# Patient Record
Sex: Male | Born: 1960 | Race: Black or African American | Hispanic: No | Smoking: Never smoker
Health system: Southern US, Community
[De-identification: ages and names within clinical notes are randomized; demographics above are authoritative.]

## PROBLEM LIST (undated history)

## (undated) DIAGNOSIS — E119 Type 2 diabetes mellitus without complications: Secondary | ICD-10-CM

## (undated) DIAGNOSIS — I890 Lymphedema, not elsewhere classified: Secondary | ICD-10-CM

## (undated) DIAGNOSIS — G459 Transient cerebral ischemic attack, unspecified: Secondary | ICD-10-CM

## (undated) DIAGNOSIS — E11319 Type 2 diabetes mellitus with unspecified diabetic retinopathy without macular edema: Secondary | ICD-10-CM

## (undated) DIAGNOSIS — I10 Essential (primary) hypertension: Secondary | ICD-10-CM

## (undated) DIAGNOSIS — G4733 Obstructive sleep apnea (adult) (pediatric): Secondary | ICD-10-CM

## (undated) DIAGNOSIS — N400 Enlarged prostate without lower urinary tract symptoms: Secondary | ICD-10-CM

## (undated) DIAGNOSIS — I509 Heart failure, unspecified: Secondary | ICD-10-CM

## (undated) HISTORY — PX: BLADDER SURGERY: SHX569

## (undated) HISTORY — PX: CARDIAC CATHETERIZATION: SHX172

---

## 2013-09-13 ENCOUNTER — Telehealth: Payer: Self-pay | Admitting: Cardiovascular Disease

## 2013-09-15 NOTE — Telephone Encounter (Signed)
Closed encounter °

## 2013-09-26 ENCOUNTER — Ambulatory Visit: Payer: Self-pay | Admitting: Cardiovascular Disease

## 2013-11-01 ENCOUNTER — Encounter: Payer: Self-pay | Admitting: Cardiovascular Disease

## 2015-05-12 ENCOUNTER — Encounter (HOSPITAL_COMMUNITY): Payer: Self-pay | Admitting: Emergency Medicine

## 2015-05-12 ENCOUNTER — Emergency Department (HOSPITAL_COMMUNITY)
Admission: EM | Admit: 2015-05-12 | Discharge: 2015-05-12 | Disposition: A | Discharge status: Home / Self Care | Payer: Medicare Other | Attending: Emergency Medicine | Admitting: Emergency Medicine

## 2015-05-12 DIAGNOSIS — E11319 Type 2 diabetes mellitus with unspecified diabetic retinopathy without macular edema: Secondary | ICD-10-CM | POA: Diagnosis not present

## 2015-05-12 DIAGNOSIS — I509 Heart failure, unspecified: Secondary | ICD-10-CM | POA: Insufficient documentation

## 2015-05-12 DIAGNOSIS — Z794 Long term (current) use of insulin: Secondary | ICD-10-CM | POA: Insufficient documentation

## 2015-05-12 DIAGNOSIS — I10 Essential (primary) hypertension: Secondary | ICD-10-CM | POA: Insufficient documentation

## 2015-05-12 DIAGNOSIS — Z8669 Personal history of other diseases of the nervous system and sense organs: Secondary | ICD-10-CM | POA: Diagnosis not present

## 2015-05-12 DIAGNOSIS — Z79899 Other long term (current) drug therapy: Secondary | ICD-10-CM | POA: Diagnosis not present

## 2015-05-12 DIAGNOSIS — Z760 Encounter for issue of repeat prescription: Secondary | ICD-10-CM | POA: Diagnosis not present

## 2015-05-12 DIAGNOSIS — N400 Enlarged prostate without lower urinary tract symptoms: Secondary | ICD-10-CM | POA: Diagnosis not present

## 2015-05-12 DIAGNOSIS — Z8673 Personal history of transient ischemic attack (TIA), and cerebral infarction without residual deficits: Secondary | ICD-10-CM | POA: Diagnosis not present

## 2015-05-12 DIAGNOSIS — Z7982 Long term (current) use of aspirin: Secondary | ICD-10-CM | POA: Diagnosis not present

## 2015-05-12 HISTORY — DX: Type 2 diabetes mellitus without complications: E11.9

## 2015-05-12 HISTORY — DX: Transient cerebral ischemic attack, unspecified: G45.9

## 2015-05-12 HISTORY — DX: Heart failure, unspecified: I50.9

## 2015-05-12 HISTORY — DX: Type 2 diabetes mellitus with unspecified diabetic retinopathy without macular edema: E11.319

## 2015-05-12 HISTORY — DX: Obstructive sleep apnea (adult) (pediatric): G47.33

## 2015-05-12 HISTORY — DX: Lymphedema, not elsewhere classified: I89.0

## 2015-05-12 HISTORY — DX: Benign prostatic hyperplasia without lower urinary tract symptoms: N40.0

## 2015-05-12 HISTORY — DX: Essential (primary) hypertension: I10

## 2015-05-12 LAB — CBG MONITORING, ED: Glucose-Capillary: 194 mg/dL — ABNORMAL HIGH (ref 65–99)

## 2015-05-12 MED ORDER — BUMETANIDE 2 MG PO TABS
2.0000 mg | ORAL_TABLET | Freq: Two times a day (BID) | ORAL | Status: DC
  Administered 2015-05-12: 2 mg via ORAL
  Filled 2015-05-12 (×2): qty 1

## 2015-05-12 MED ORDER — HYDRALAZINE HCL 50 MG PO TABS
100.0000 mg | ORAL_TABLET | Freq: Three times a day (TID) | ORAL | Status: DC
  Administered 2015-05-12: 100 mg via ORAL
  Filled 2015-05-12: qty 2

## 2015-05-12 MED ORDER — INSULIN GLARGINE 100 UNIT/ML ~~LOC~~ SOLN
8.0000 [IU] | Freq: Every day | SUBCUTANEOUS | Status: DC
  Administered 2015-05-12: 8 [IU] via SUBCUTANEOUS
  Filled 2015-05-12: qty 0.08

## 2015-05-12 MED ORDER — SULFASALAZINE 500 MG PO TABS
500.0000 mg | ORAL_TABLET | Freq: Two times a day (BID) | ORAL | Status: DC
  Administered 2015-05-12: 500 mg via ORAL
  Filled 2015-05-12: qty 1

## 2015-05-12 MED ORDER — CARVEDILOL 25 MG PO TABS
25.0000 mg | ORAL_TABLET | Freq: Two times a day (BID) | ORAL | Status: DC
Start: 2015-05-13 — End: 2015-05-12
  Administered 2015-05-12: 25 mg via ORAL
  Filled 2015-05-12: qty 1

## 2015-05-12 MED ORDER — CALCIUM CARBONATE-VITAMIN D 500-200 MG-UNIT PO TABS
1.0000 | ORAL_TABLET | Freq: Two times a day (BID) | ORAL | Status: DC
  Administered 2015-05-12: 1 via ORAL
  Filled 2015-05-12: qty 1

## 2015-05-12 MED ORDER — DOXAZOSIN MESYLATE 4 MG PO TABS
4.0000 mg | ORAL_TABLET | Freq: Every day | ORAL | Status: DC
  Administered 2015-05-12: 4 mg via ORAL
  Filled 2015-05-12: qty 1

## 2015-05-12 MED ORDER — METOLAZONE 5 MG PO TABS
5.0000 mg | ORAL_TABLET | ORAL | Status: DC
  Administered 2015-05-12: 5 mg via ORAL
  Filled 2015-05-12: qty 1

## 2015-05-12 NOTE — Discharge Instructions (Signed)
You have prescriptions to fill Please ask the staff to help you fill these through the [place] You have been given tonight's medication including the Lantus Insulin  Medicine Refill at the Emergency Department We have refilled your medicine today, but it is best for you to get refills through your primary health care provider's office. In the future, please plan ahead so you do not need to get refills from the emergency department. If the medicine we refilled was a maintenance medicine, you may have received only enough to get you by until you are able to see your regular health care provider.   This information is not intended to replace advice given to you by your health care provider. Make sure you discuss any questions you have with your health care provider.   Document Released: 05/09/2003 Document Revised: 02/10/2014 Document Reviewed: 04/29/2013 Elsevier Interactive Patient Education [employer]

## 2015-05-12 NOTE — ED Provider Notes (Signed)
CSN: [id number]     Arrival date & time 05/12/15  1740 History   First MD Initiated Contact with Patient 05/12/15 1928     Chief Complaint  Patient presents with  . Hypertension  . Medication Refill     (Consider location/radiation/quality/duration/timing/severity/associated sxs/prior Treatment) HPI Comments: 55 year old patient who was discharged from a rehabilitation facility to the surgical center, which is a residence for homeless veterans.  He was discharged with prescriptions, but states that the pharmacy at the facility is not open until Monday and he is concerned because he hasn't had any of his routine medications.  Since being discharged from the hospital this morning. He denies any shortness of breath, nausea, headache, chest pain, dizziness.  Patient is a 55 y.o. male presenting with hypertension. The history is provided by the patient.  Hypertension This is a chronic problem. Pertinent negatives include no chest pain, coughing or fever.    Past Medical History  Diagnosis Date  . BPH (benign prostatic hyperplasia)   . CHF (congestive heart failure) (HCC)   . Hypertension   . Diabetes mellitus without complication (HCC)   . Diabetic retinopathy associated with type 2 diabetes mellitus (HCC)   . OSA (obstructive sleep apnea)   . Lymphedema   . TIA (transient ischemic attack)    History reviewed. No pertinent past surgical history. History reviewed. No pertinent family history. Social History  Substance Use Topics  . Smoking status: Never Smoker   . Smokeless tobacco: None  . Alcohol Use: No    Review of Systems  Constitutional: Negative for fever.  Respiratory: Negative for cough and shortness of breath.   Cardiovascular: Negative for chest pain.  All other systems reviewed and are negative.     Allergies  Chicken allergy  Home Medications   Prior to Admission medications   Medication Sig Start Date End Date Taking? Authorizing Provider  acetaminophen  (TYLENOL) 325 MG tablet Take 650 mg by mouth every 6 (six) hours as needed for mild pain, moderate pain or headache.   Yes Historical Provider, MD  amLODipine (NORVASC) 10 MG tablet Take 10 mg by mouth daily.   Yes Historical Provider, MD  aspirin EC 81 MG tablet Take 81 mg by mouth daily.   Yes Historical Provider, MD  bumetanide (BUMEX) 2 MG tablet Take 2 mg by mouth 2 (two) times daily.   Yes Historical Provider, MD  Calcium Carbonate-Vitamin D (CALCIUM-VITAMIN D) 500-200 MG-UNIT tablet Take 1 tablet by mouth 2 (two) times daily.   Yes Historical Provider, MD  carvedilol (COREG) 25 MG tablet Take 25 mg by mouth 2 (two) times daily with a meal.   Yes Historical Provider, MD  doxazosin (CARDURA) 4 MG tablet Take 4 mg by mouth daily.   Yes Historical Provider, MD  hydrALAZINE (APRESOLINE) 100 MG tablet Take 100 mg by mouth 3 (three) times daily.   Yes Historical Provider, MD  Insulin Aspart (NOVOLOG FLEXPEN [place]) Inject 5 Units into the skin daily.   Yes Historical Provider, MD  insulin aspart (NOVOLOG FLEXPEN) 100 UNIT/ML FlexPen Inject 2-10 Units into the skin 3 (three) times daily with meals. Per sliding scale : 0-200= 0 units 201-250= 2 units  251-300= 4 units 301-350= 6 units 351-400= 8 units 401-450= 10 units 451-999= CALL MD   Yes Historical Provider, MD  insulin glargine (LANTUS) 100 UNIT/ML injection Inject 8 Units into the skin at bedtime.   Yes Historical Provider, MD  lisinopril (PRINIVIL,ZESTRIL) 40 MG tablet Take 40 mg by  mouth daily.   Yes Historical Provider, MD  metolazone (ZAROXOLYN) 5 MG tablet Take 5 mg by mouth every 3 (three) days.   Yes Historical Provider, MD  Multiple Vitamins-Minerals (MULTIVITAMIN ADULT PO) Take 1 tablet by mouth daily.   Yes Historical Provider, MD  pantoprazole (PROTONIX) 40 MG tablet Take 40 mg by mouth daily.   Yes Historical Provider, MD  polyethylene glycol (MIRALAX / GLYCOLAX) packet Take 17 g by mouth daily as needed for mild constipation or  moderate constipation.   Yes Historical Provider, MD  silver sulfADIAZINE (SILVADENE) 1 % cream Apply 1 application topically daily as needed (skin).   Yes Historical Provider, MD  spironolactone (ALDACTONE) 25 MG tablet Take 25 mg by mouth daily.   Yes Historical Provider, MD  sulfaSALAzine (AZULFIDINE) 500 MG tablet Take 500 mg by mouth 2 (two) times daily.   Yes Historical Provider, MD  tamsulosin (FLOMAX) 0.4 MG CAPS capsule Take 0.4 mg by mouth daily.   Yes Historical Provider, MD   BP 166/85 mmHg  Pulse 77  Temp(Src) 98 F (36.7 C) (Oral)  Resp 16  SpO2 98% Physical Exam  Constitutional: He is oriented to person, place, and time. He appears well-developed and well-nourished.  HENT:  Head: Normocephalic.  Eyes: Pupils are equal, round, and reactive to light.  Neck: Normal range of motion.  Cardiovascular: Normal rate.   Pulmonary/Chest: Effort normal.  Musculoskeletal: Normal range of motion.  Neurological: He is alert and oriented to person, place, and time.  Skin: Skin is warm.  Vitals reviewed.   ED Course  Procedures (including critical care time) Labs Review Labs Reviewed  CBG MONITORING, ED - Abnormal; Notable for the following:    Glucose-Capillary 194 (*)    All other components within normal limits    Imaging Review No results found. I have personally reviewed and evaluated these images and lab results as part of my medical decision-making.   EKG Interpretation None    I have ordered the patient's  PM medications.  I explained that I cannot provide him with another days worth of medication.  He will have to discuss with his residential advisor of perhaps somebody going to the [facility] itself to fill his prescriptions for tomorrow.  MDM   Final diagnoses:  Encounter for medication refill        Earley Favor, NP 05/12/15 2008  Lorre Nick, MD 05/16/15 [phone]

## 2015-05-12 NOTE — ED Notes (Signed)
Bed: GBM2 Expected date:  Expected time:  Means of arrival:  Comments: EMS- hypertension- pt wants rx

## 2015-05-12 NOTE — ED Notes (Signed)
Pt discharged from a rehab facility after CHF flare-up. Pt was discharged without prescription for home medications. Pt not having any symptoms, but wants prescriptions for his home medications. Pt was on many medications at the facility.

## 2015-05-16 ENCOUNTER — Emergency Department (HOSPITAL_COMMUNITY)
Admission: EM | Admit: 2015-05-16 | Discharge: 2015-05-16 | Disposition: A | Discharge status: Home / Self Care | Payer: Medicare Other | Attending: Emergency Medicine | Admitting: Emergency Medicine

## 2015-05-16 ENCOUNTER — Encounter (HOSPITAL_COMMUNITY): Payer: Self-pay

## 2015-05-16 DIAGNOSIS — Z8669 Personal history of other diseases of the nervous system and sense organs: Secondary | ICD-10-CM | POA: Insufficient documentation

## 2015-05-16 DIAGNOSIS — Z9889 Other specified postprocedural states: Secondary | ICD-10-CM | POA: Diagnosis not present

## 2015-05-16 DIAGNOSIS — I509 Heart failure, unspecified: Secondary | ICD-10-CM | POA: Insufficient documentation

## 2015-05-16 DIAGNOSIS — Z79899 Other long term (current) drug therapy: Secondary | ICD-10-CM | POA: Insufficient documentation

## 2015-05-16 DIAGNOSIS — I10 Essential (primary) hypertension: Secondary | ICD-10-CM | POA: Insufficient documentation

## 2015-05-16 DIAGNOSIS — E860 Dehydration: Secondary | ICD-10-CM | POA: Diagnosis not present

## 2015-05-16 DIAGNOSIS — E11319 Type 2 diabetes mellitus with unspecified diabetic retinopathy without macular edema: Secondary | ICD-10-CM | POA: Diagnosis not present

## 2015-05-16 DIAGNOSIS — R112 Nausea with vomiting, unspecified: Secondary | ICD-10-CM | POA: Insufficient documentation

## 2015-05-16 DIAGNOSIS — Z8673 Personal history of transient ischemic attack (TIA), and cerebral infarction without residual deficits: Secondary | ICD-10-CM | POA: Diagnosis not present

## 2015-05-16 DIAGNOSIS — N400 Enlarged prostate without lower urinary tract symptoms: Secondary | ICD-10-CM | POA: Insufficient documentation

## 2015-05-16 DIAGNOSIS — Z794 Long term (current) use of insulin: Secondary | ICD-10-CM | POA: Insufficient documentation

## 2015-05-16 DIAGNOSIS — Z7982 Long term (current) use of aspirin: Secondary | ICD-10-CM | POA: Diagnosis not present

## 2015-05-16 DIAGNOSIS — R6883 Chills (without fever): Secondary | ICD-10-CM | POA: Diagnosis present

## 2015-05-16 LAB — COMPREHENSIVE METABOLIC PANEL
ALBUMIN: 3.3 g/dL — AB (ref 3.5–5.0)
ALT: 47 U/L (ref 17–63)
ANION GAP: 8 (ref 5–15)
AST: 59 U/L — ABNORMAL HIGH (ref 15–41)
Alkaline Phosphatase: 228 U/L — ABNORMAL HIGH (ref 38–126)
BUN: 63 mg/dL — ABNORMAL HIGH (ref 6–20)
CHLORIDE: 108 mmol/L (ref 101–111)
CO2: 28 mmol/L (ref 22–32)
Calcium: 9.7 mg/dL (ref 8.9–10.3)
Creatinine, Ser: 1.29 mg/dL — ABNORMAL HIGH (ref 0.61–1.24)
GFR calc non Af Amer: >60 mL/min (ref >60)
GLUCOSE: 116 mg/dL — AB (ref 65–99)
Potassium: 4 mmol/L (ref 3.5–5.1)
SODIUM: 144 mmol/L (ref 135–145)
Total Bilirubin: 0.5 mg/dL (ref 0.3–1.2)
Total Protein: 7.1 g/dL (ref 6.5–8.1)

## 2015-05-16 LAB — CBC
HCT: 31.6 % — ABNORMAL LOW (ref 39.0–52.0)
HEMOGLOBIN: 10.7 g/dL — AB (ref 13.0–17.0)
MCH: 29.8 pg (ref 26.0–34.0)
MCHC: 33.9 g/dL (ref 30.0–36.0)
MCV: 88 fL (ref 78.0–100.0)
PLATELETS: 187 10*3/uL (ref 150–400)
RBC: 3.59 MIL/uL — ABNORMAL LOW (ref 4.22–5.81)
RDW: 14.1 % (ref 11.5–15.5)
WBC: 6.7 10*3/uL (ref 4.0–10.5)

## 2015-05-16 MED ORDER — SODIUM CHLORIDE 0.9 % IV SOLN
1000.0000 mL | INTRAVENOUS | Status: DC
  Administered 2015-05-16: 1000 mL via INTRAVENOUS

## 2015-05-16 MED ORDER — MORPHINE SULFATE (PF) 4 MG/ML IV SOLN
4.0000 mg | Freq: Once | INTRAVENOUS | Status: AC
  Administered 2015-05-16: 4 mg via INTRAVENOUS
  Filled 2015-05-16: qty 1

## 2015-05-16 MED ORDER — ONDANSETRON HCL 4 MG/2ML IJ SOLN
4.0000 mg | Freq: Once | INTRAMUSCULAR | Status: AC
  Administered 2015-05-16: 4 mg via INTRAVENOUS
  Filled 2015-05-16: qty 2

## 2015-05-16 MED ORDER — ONDANSETRON 8 MG PO TBDP: 8.0000 mg | ORAL_TABLET | Freq: Three times a day (TID) | ORAL | Status: DC | PRN

## 2015-05-16 MED ORDER — SODIUM CHLORIDE 0.9 % IV SOLN
1000.0000 mL | Freq: Once | INTRAVENOUS | Status: AC
  Administered 2015-05-16: 1000 mL via INTRAVENOUS

## 2015-05-16 NOTE — ED Notes (Signed)
Bed: EV03 Expected date:  Expected time:  Means of arrival:  Comments: EMS-HTN

## 2015-05-16 NOTE — Discharge Instructions (Signed)
Please follow-up with your medical provider in the next 72 hours for recheck of your kidney function or return to the emergency department for recheck of this blood work.  Please return to the emergency department for any new or worsening symptoms   Dehydration, Adult Dehydration is a condition in which you do not have enough fluid or water in your body. It happens when you take in less fluid than you lose. Vital organs such as the kidneys, brain, and heart cannot function without a proper amount of fluids. Any loss of fluids from the body can cause dehydration.  Dehydration can range from mild to severe. This condition should be treated right away to help prevent it from becoming severe. CAUSES  This condition may be caused by:  Vomiting.  Diarrhea.  Excessive sweating, such as when exercising in hot or humid weather.  Not drinking enough fluid during strenuous exercise or during an illness.  Excessive urine output.  Fever.  Certain medicines. RISK FACTORS This condition is more likely to develop in:  People who are taking certain medicines that cause the body to lose excess fluid (diuretics).   People who have a chronic illness, such as diabetes, that may increase urination.  Older adults.   People who live at high altitudes.   People who participate in endurance sports.  SYMPTOMS  Mild Dehydration  Thirst.  Dry lips.  Slightly dry mouth.  Dry, warm skin. Moderate Dehydration  Very dry mouth.   Muscle cramps.   Dark urine and decreased urine production.   Decreased tear production.   Headache.   Light-headedness, especially when you stand up from a sitting position.  Severe Dehydration  Changes in skin.   Cold and clammy skin.   Skin does not spring back quickly when lightly pinched and released.   Changes in body fluids.   Extreme thirst.   No tears.   Not able to sweat when body temperature is high, such as in hot weather.    Minimal urine production.   Changes in vital signs.   Rapid, weak pulse (more than 100 beats per minute when you are sitting still).   Rapid breathing.   Low blood pressure.   Other changes.   Sunken eyes.   Cold hands and feet.   Confusion.  Lethargy and difficulty being awakened.  Fainting (syncope).   Short-term weight loss.   Unconsciousness. DIAGNOSIS  This condition may be diagnosed based on your symptoms. You may also have tests to determine how severe your dehydration is. These tests may include:   Urine tests.   Blood tests.  TREATMENT  Treatment for this condition depends on the severity. Mild or moderate dehydration can often be treated at home. Treatment should be started right away. Do not wait until dehydration becomes severe. Severe dehydration needs to be treated at the hospital. Treatment for Mild Dehydration  Drinking plenty of water to replace the fluid you have lost.   Replacing minerals in your blood (electrolytes) that you may have lost.  Treatment for Moderate Dehydration  Consuming oral rehydration solution (ORS). Treatment for Severe Dehydration  Receiving fluid through an IV tube.   Receiving electrolyte solution through a feeding tube that is passed through your nose and into your stomach (nasogastric tube or NG tube).  Correcting any abnormalities in electrolytes. HOME CARE INSTRUCTIONS   Drink enough fluid to keep your urine clear or pale yellow.   Drink water or fluid slowly by taking small sips. You can also  try sucking on ice cubes.  Have food or beverages that contain electrolytes. Examples include bananas and sports drinks.  Take over-the-counter and prescription medicines only as told by your health care provider.   Prepare ORS according to the manufacturer's instructions. Take sips of ORS every 5 minutes until your urine returns to normal.  If you have vomiting or diarrhea, continue to try to drink  water, ORS, or both.   If you have diarrhea, avoid:   Beverages that contain caffeine.   Fruit juice.   Milk.   Carbonated soft drinks.  Do not take salt tablets. This can lead to the condition of having too much sodium in your body (hypernatremia).  SEEK MEDICAL CARE IF:  You cannot eat or drink without vomiting.  You have had moderate diarrhea during a period of more than 24 hours.  You have a fever. SEEK IMMEDIATE MEDICAL CARE IF:   You have extreme thirst.  You have severe diarrhea.  You have not urinated in 6-8 hours, or you have urinated only a small amount of very dark urine.  You have shriveled skin.  You are dizzy, confused, or both.   This information is not intended to replace advice given to you by your health care provider. Make sure you discuss any questions you have with your health care provider.   Document Released: 01/20/2005 Document Revised: 10/11/2014 Document Reviewed: 06/07/2014 Elsevier Interactive Patient Education [employer]

## 2015-05-16 NOTE — ED Notes (Signed)
Per EMS- patient has been staying at the [facility] [address] Patient c/o hypertension and chills. Patient states he is compliant with meds EKG-NSR. No c/o headache, dizziness or SOB.

## 2015-05-16 NOTE — ED Notes (Addendum)
PT STATES HE FEELS AS IF THE MORPHINE MEDICATION IS MAKING HIM HAVE HALLUCINATIONS. DENIES ANY OTHER SYMPTOMS.

## 2015-05-16 NOTE — ED Provider Notes (Signed)
CSN: [id number]     Arrival date & time 05/16/15  1016 History   First MD Initiated Contact with Patient 05/16/15 1025     Chief Complaint  Patient presents with  . Hypertension  . Chills      HPI Patient presents to the emergency department with complaints of chills as well as nausea and vomiting as well as some diarrhea since this morning.  No recent sick contacts.  He states he is having chills and feels slightly weak.  Denies dysuria or urinary frequency.  Symptoms are mild to moderate in severity.  No other complaints.  Patient does have a history of congestive heart failure, hypertension, diabetes.  He reports compliance with medications.  Denies chest pain shortness breath.  No productive cough.  Denies focal abdominal pain at this time.  Reports crampy generalized abdominal discomfort    Past Medical History  Diagnosis Date  . BPH (benign prostatic hyperplasia)   . CHF (congestive heart failure) (HCC)   . Hypertension   . Diabetes mellitus without complication (HCC)   . Diabetic retinopathy associated with type 2 diabetes mellitus (HCC)   . OSA (obstructive sleep apnea)   . Lymphedema   . TIA (transient ischemic attack)    Past Surgical History  Procedure Laterality Date  . Bladder surgery    . Cardiac catheterization     Family History  Problem Relation Age of Onset  . Cancer Mother   . Cancer Father    Social History  Substance Use Topics  . Smoking status: Never Smoker   . Smokeless tobacco: Never Used  . Alcohol Use: No    Review of Systems  All other systems reviewed and are negative.     Allergies  Chicken allergy  Home Medications   Prior to Admission medications   Medication Sig Start Date End Date Taking? Authorizing Provider  acetaminophen (TYLENOL) 325 MG tablet Take 650 mg by mouth every 6 (six) hours as needed for mild pain, moderate pain or headache.   Yes Historical Provider, MD  amLODipine (NORVASC) 10 MG tablet Take 10 mg by mouth  daily.   Yes Historical Provider, MD  aspirin EC 81 MG tablet Take 81 mg by mouth daily.   Yes Historical Provider, MD  atorvastatin (LIPITOR) 40 MG tablet Take 20 mg by mouth at bedtime.    Yes Historical Provider, MD  bumetanide (BUMEX) 2 MG tablet Take 2 mg by mouth 2 (two) times daily.   Yes Historical Provider, MD  Calcium Carbonate-Vitamin D (CALCIUM-VITAMIN D) 500-200 MG-UNIT tablet Take 1 tablet by mouth 2 (two) times daily.   Yes Historical Provider, MD  carvedilol (COREG) 25 MG tablet Take 25 mg by mouth 2 (two) times daily with a meal.   Yes Historical Provider, MD  citalopram (CELEXA) 40 MG tablet Take 40 mg by mouth daily.   Yes Historical Provider, MD  doxazosin (CARDURA) 8 MG tablet Take 12 mg by mouth at bedtime.   Yes Historical Provider, MD  hydrALAZINE (APRESOLINE) 100 MG tablet Take 100 mg by mouth 3 (three) times daily.   Yes Historical Provider, MD  Insulin Aspart (NOVOLOG FLEXPEN [place]) Inject 5 Units into the skin daily.   Yes Historical Provider, MD  insulin aspart (NOVOLOG FLEXPEN) 100 UNIT/ML FlexPen Inject 2-10 Units into the skin 3 (three) times daily with meals. Per sliding scale : 0-200= 0 units 201-250= 2 units  251-300= 4 units 301-350= 6 units 351-400= 8 units 401-450= 10 units 451-999= CALL MD  Yes Historical Provider, MD  insulin glargine (LANTUS) 100 UNIT/ML injection Inject 8 Units into the skin at bedtime.   Yes Historical Provider, MD  isosorbide mononitrate (IMDUR) 30 MG 24 hr tablet Take 30 mg by mouth daily.   Yes Historical Provider, MD  lisinopril (PRINIVIL,ZESTRIL) 40 MG tablet Take 40 mg by mouth daily.   Yes Historical Provider, MD  metolazone (ZAROXOLYN) 5 MG tablet Take 5 mg by mouth every 3 (three) days.   Yes Historical Provider, MD  Multiple Vitamins-Minerals (MULTIVITAMIN ADULT PO) Take 1 tablet by mouth daily.   Yes Historical Provider, MD  oxybutynin (DITROPAN) 5 MG tablet Take 5 mg by mouth 3 (three) times daily.   Yes Historical Provider,  MD  pantoprazole (PROTONIX) 40 MG tablet Take 40 mg by mouth daily.   Yes Historical Provider, MD  polyethylene glycol (MIRALAX / GLYCOLAX) packet Take 17 g by mouth daily as needed for mild constipation or moderate constipation.   Yes Historical Provider, MD  potassium chloride SA (K-DUR,KLOR-CON) 20 MEQ tablet Take 20 mEq by mouth daily.   Yes Historical Provider, MD  silver sulfADIAZINE (SILVADENE) 1 % cream Apply 1 application topically daily as needed (skin).   Yes Historical Provider, MD  spironolactone (ALDACTONE) 25 MG tablet Take 50 mg by mouth daily.    Yes Historical Provider, MD  sulfaSALAzine (AZULFIDINE) 500 MG tablet Take 2,000 mg by mouth 2 (two) times daily.    Yes Historical Provider, MD  tamsulosin (FLOMAX) 0.4 MG CAPS capsule Take 0.4 mg by mouth at bedtime.    Yes Historical Provider, MD   BP 125/73 mmHg  Pulse 64  Temp(Src) 98.1 F (36.7 C) (Oral)  Resp 9  Ht 5\' 11"  (1.803 m)  Wt 247 lb (112.038 kg)  BMI 34.46 kg/m2  SpO2 97% Physical Exam  Constitutional: He is oriented to person, place, and time. He appears well-developed and well-nourished.  HENT:  Head: Normocephalic and atraumatic.  Eyes: EOM are normal.  Neck: Normal range of motion.  Cardiovascular: Normal rate, regular rhythm and normal heart sounds.   Pulmonary/Chest: Effort normal and breath sounds normal. No respiratory distress.  Abdominal: Soft. He exhibits no distension. There is no tenderness.  Musculoskeletal: Normal range of motion.  Neurological: He is alert and oriented to person, place, and time.  Skin: Skin is warm and dry.  Psychiatric: He has a normal mood and affect. Judgment normal.  Nursing note and vitals reviewed.   ED Course  Procedures (including critical care time) Labs Review Labs Reviewed  CBC - Abnormal; Notable for the following:    RBC 3.59 (*)    Hemoglobin 10.7 (*)    HCT 31.6 (*)    All other components within normal limits  COMPREHENSIVE METABOLIC PANEL -  Abnormal; Notable for the following:    Glucose, Bld 116 (*)    BUN 63 (*)    Creatinine, Ser 1.29 (*)    Albumin 3.3 (*)    AST 59 (*)    Alkaline Phosphatase 228 (*)    All other components within normal limits    Imaging Review No results found. I have personally reviewed and evaluated these images and lab results as part of my medical decision-making.   EKG Interpretation None      MDM   Final diagnoses:  None    Through care ever where I found that the patient's baseline creatinine is around 1.2-1.5 but his BUN is normally around 19 or 20.  His urine today  is 38 this likely represents dehydration.  He feels much better after IV fluids.  Discharge home in good condition at this time.  Home with nausea medication.  Repeat abdominal exam is benign.  I've encouraged ongoing oral hydration at home.  Have asked they return to the emergency department for new or worsening symptoms.  He'll need to follow-up with his doctor in the next 72 hours for recheck of his BUN and creatinine I have asked him to follow back up the emergency department in 72 hours for repeat lab work if he is unable to follow-up with his doctor.    Azalia Bilis, MD 05/16/15 [phone]

## 2015-05-16 NOTE — ED Notes (Signed)
PT DISCHARGED. INSTRUCTIONS AND PRESCRIPTION GIVEN TO PTAR STAFF. AAOX3. PT IN NO APPARENT DISTRESS OR PAIN. THE OPPORTUNITY TO ASK QUESTIONS WAS PROVIDED. 

## 2015-05-20 ENCOUNTER — Emergency Department (HOSPITAL_COMMUNITY): Payer: Medicare Other

## 2015-05-20 ENCOUNTER — Encounter (HOSPITAL_COMMUNITY): Payer: Self-pay

## 2015-05-20 ENCOUNTER — Inpatient Hospital Stay (HOSPITAL_COMMUNITY)
Admission: EM | Admit: 2015-05-20 | Discharge: 2015-05-23 | DRG: 682 | Disposition: A | Discharge status: Home / Self Care | Payer: Medicare Other | Attending: Internal Medicine | Admitting: Internal Medicine

## 2015-05-20 DIAGNOSIS — E669 Obesity, unspecified: Secondary | ICD-10-CM | POA: Diagnosis present

## 2015-05-20 DIAGNOSIS — I890 Lymphedema, not elsewhere classified: Secondary | ICD-10-CM | POA: Diagnosis present

## 2015-05-20 DIAGNOSIS — N400 Enlarged prostate without lower urinary tract symptoms: Secondary | ICD-10-CM | POA: Diagnosis present

## 2015-05-20 DIAGNOSIS — Z794 Long term (current) use of insulin: Secondary | ICD-10-CM

## 2015-05-20 DIAGNOSIS — Z8673 Personal history of transient ischemic attack (TIA), and cerebral infarction without residual deficits: Secondary | ICD-10-CM

## 2015-05-20 DIAGNOSIS — Z7982 Long term (current) use of aspirin: Secondary | ICD-10-CM

## 2015-05-20 DIAGNOSIS — Z5900 Homelessness unspecified: Secondary | ICD-10-CM

## 2015-05-20 DIAGNOSIS — S80211A Abrasion, right knee, initial encounter: Secondary | ICD-10-CM | POA: Diagnosis present

## 2015-05-20 DIAGNOSIS — N183 Chronic kidney disease, stage 3 (moderate): Secondary | ICD-10-CM | POA: Diagnosis present

## 2015-05-20 DIAGNOSIS — Z79899 Other long term (current) drug therapy: Secondary | ICD-10-CM

## 2015-05-20 DIAGNOSIS — R531 Weakness: Secondary | ICD-10-CM | POA: Insufficient documentation

## 2015-05-20 DIAGNOSIS — E11319 Type 2 diabetes mellitus with unspecified diabetic retinopathy without macular edema: Secondary | ICD-10-CM | POA: Diagnosis present

## 2015-05-20 DIAGNOSIS — N179 Acute kidney failure, unspecified: Secondary | ICD-10-CM | POA: Diagnosis not present

## 2015-05-20 DIAGNOSIS — I5043 Acute on chronic combined systolic (congestive) and diastolic (congestive) heart failure: Secondary | ICD-10-CM

## 2015-05-20 DIAGNOSIS — G4733 Obstructive sleep apnea (adult) (pediatric): Secondary | ICD-10-CM

## 2015-05-20 DIAGNOSIS — Z6834 Body mass index (BMI) 34.0-34.9, adult: Secondary | ICD-10-CM

## 2015-05-20 DIAGNOSIS — Z91018 Allergy to other foods: Secondary | ICD-10-CM

## 2015-05-20 DIAGNOSIS — E1122 Type 2 diabetes mellitus with diabetic chronic kidney disease: Secondary | ICD-10-CM | POA: Diagnosis present

## 2015-05-20 DIAGNOSIS — I10 Essential (primary) hypertension: Secondary | ICD-10-CM | POA: Diagnosis present

## 2015-05-20 DIAGNOSIS — E1310 Other specified diabetes mellitus with ketoacidosis without coma: Secondary | ICD-10-CM | POA: Diagnosis present

## 2015-05-20 DIAGNOSIS — E785 Hyperlipidemia, unspecified: Secondary | ICD-10-CM | POA: Diagnosis present

## 2015-05-20 DIAGNOSIS — E119 Type 2 diabetes mellitus without complications: Secondary | ICD-10-CM | POA: Diagnosis present

## 2015-05-20 DIAGNOSIS — D638 Anemia in other chronic diseases classified elsewhere: Secondary | ICD-10-CM | POA: Diagnosis present

## 2015-05-20 DIAGNOSIS — R29898 Other symptoms and signs involving the musculoskeletal system: Secondary | ICD-10-CM | POA: Diagnosis present

## 2015-05-20 DIAGNOSIS — Z809 Family history of malignant neoplasm, unspecified: Secondary | ICD-10-CM

## 2015-05-20 DIAGNOSIS — F329 Major depressive disorder, single episode, unspecified: Secondary | ICD-10-CM | POA: Diagnosis present

## 2015-05-20 DIAGNOSIS — I130 Hypertensive heart and chronic kidney disease with heart failure and stage 1 through stage 4 chronic kidney disease, or unspecified chronic kidney disease: Secondary | ICD-10-CM | POA: Diagnosis present

## 2015-05-20 DIAGNOSIS — W1830XA Fall on same level, unspecified, initial encounter: Secondary | ICD-10-CM | POA: Diagnosis present

## 2015-05-20 DIAGNOSIS — I509 Heart failure, unspecified: Secondary | ICD-10-CM | POA: Diagnosis present

## 2015-05-20 DIAGNOSIS — E860 Dehydration: Secondary | ICD-10-CM | POA: Diagnosis present

## 2015-05-20 LAB — URINE MICROSCOPIC-ADD ON

## 2015-05-20 LAB — DIFFERENTIAL
Basophils Absolute: 0 10*3/uL (ref 0.0–0.1)
Basophils Relative: 0 %
EOS ABS: 0 10*3/uL (ref 0.0–0.7)
EOS PCT: 1 %
LYMPHS ABS: 1.3 10*3/uL (ref 0.7–4.0)
Lymphocytes Relative: 22 %
MONO ABS: 0.5 10*3/uL (ref 0.1–1.0)
MONOS PCT: 8 %
NEUTROS PCT: 69 %
Neutro Abs: 4.1 10*3/uL (ref 1.7–7.7)

## 2015-05-20 LAB — I-STAT CHEM 8, ED
BUN: 45 mg/dL — ABNORMAL HIGH (ref 6–20)
CALCIUM ION: 1.2 mmol/L (ref 1.12–1.23)
Chloride: 101 mmol/L (ref 101–111)
Creatinine, Ser: 2.1 mg/dL — ABNORMAL HIGH (ref 0.61–1.24)
GLUCOSE: 141 mg/dL — AB (ref 65–99)
HCT: 33 % — ABNORMAL LOW (ref 39.0–52.0)
Hemoglobin: 11.2 g/dL — ABNORMAL LOW (ref 13.0–17.0)
Potassium: 4.3 mmol/L (ref 3.5–5.1)
SODIUM: 140 mmol/L (ref 135–145)
TCO2: 27 mmol/L (ref 0–100)

## 2015-05-20 LAB — URINALYSIS, ROUTINE W REFLEX MICROSCOPIC
BILIRUBIN URINE: NEGATIVE
Glucose, UA: NEGATIVE mg/dL
HGB URINE DIPSTICK: NEGATIVE
Ketones, ur: NEGATIVE mg/dL
Leukocytes, UA: NEGATIVE
Nitrite: NEGATIVE
PROTEIN: 100 mg/dL — AB
Specific Gravity, Urine: 1.011 (ref 1.005–1.030)
pH: 6 (ref 5.0–8.0)

## 2015-05-20 LAB — COMPREHENSIVE METABOLIC PANEL
ALBUMIN: 3.1 g/dL — AB (ref 3.5–5.0)
ALK PHOS: 196 U/L — AB (ref 38–126)
ALT: 33 U/L (ref 17–63)
ANION GAP: 12 (ref 5–15)
AST: 43 U/L — ABNORMAL HIGH (ref 15–41)
BILIRUBIN TOTAL: 0.6 mg/dL (ref 0.3–1.2)
BUN: 48 mg/dL — ABNORMAL HIGH (ref 6–20)
CALCIUM: 9.5 mg/dL (ref 8.9–10.3)
CO2: 26 mmol/L (ref 22–32)
Chloride: 101 mmol/L (ref 101–111)
Creatinine, Ser: 2.19 mg/dL — ABNORMAL HIGH (ref 0.61–1.24)
GFR, EST AFRICAN AMERICAN: 37 mL/min — AB (ref >60)
GFR, EST NON AFRICAN AMERICAN: 32 mL/min — AB (ref >60)
Glucose, Bld: 146 mg/dL — ABNORMAL HIGH (ref 65–99)
POTASSIUM: 4.3 mmol/L (ref 3.5–5.1)
Sodium: 139 mmol/L (ref 135–145)
TOTAL PROTEIN: 6.8 g/dL (ref 6.5–8.1)

## 2015-05-20 LAB — CBC
HCT: 31.2 % — ABNORMAL LOW (ref 39.0–52.0)
HEMOGLOBIN: 10.3 g/dL — AB (ref 13.0–17.0)
MCH: 29 pg (ref 26.0–34.0)
MCHC: 33 g/dL (ref 30.0–36.0)
MCV: 87.9 fL (ref 78.0–100.0)
Platelets: 171 10*3/uL (ref 150–400)
RBC: 3.55 MIL/uL — ABNORMAL LOW (ref 4.22–5.81)
RDW: 13.8 % (ref 11.5–15.5)
WBC: 5.9 10*3/uL (ref 4.0–10.5)

## 2015-05-20 LAB — SODIUM, URINE, RANDOM: Sodium, Ur: 103 mmol/L

## 2015-05-20 LAB — APTT: aPTT: 34 seconds (ref 24–37)

## 2015-05-20 LAB — PROTIME-INR
INR: 1.22 (ref 0.00–1.49)
Prothrombin Time: 15.5 seconds — ABNORMAL HIGH (ref 11.6–15.2)

## 2015-05-20 LAB — I-STAT TROPONIN, ED: TROPONIN I, POC: 0.06 ng/mL (ref 0.00–0.08)

## 2015-05-20 LAB — CBG MONITORING, ED: GLUCOSE-CAPILLARY: 120 mg/dL — AB (ref 65–99)

## 2015-05-20 LAB — CREATININE, URINE, RANDOM: CREATININE, URINE: 44.54 mg/dL

## 2015-05-20 MED ORDER — CITALOPRAM HYDROBROMIDE 10 MG PO TABS
40.0000 mg | ORAL_TABLET | Freq: Every day | ORAL | Status: DC
  Administered 2015-05-21 – 2015-05-23 (×3): 40 mg via ORAL
  Filled 2015-05-20 (×3): qty 4

## 2015-05-20 MED ORDER — SODIUM CHLORIDE 0.9 % IV BOLUS (SEPSIS)
1000.0000 mL | Freq: Once | INTRAVENOUS | Status: AC
  Administered 2015-05-20: 1000 mL via INTRAVENOUS

## 2015-05-20 MED ORDER — ACETAMINOPHEN 325 MG PO TABS
650.0000 mg | ORAL_TABLET | Freq: Three times a day (TID) | ORAL | Status: DC | PRN
  Filled 2015-05-20: qty 2

## 2015-05-20 MED ORDER — FERROUS SULFATE 325 (65 FE) MG PO TABS
325.0000 mg | ORAL_TABLET | Freq: Every day | ORAL | Status: DC
  Administered 2015-05-21 – 2015-05-23 (×3): 325 mg via ORAL
  Filled 2015-05-20 (×3): qty 1

## 2015-05-20 MED ORDER — POTASSIUM CHLORIDE CRYS ER 20 MEQ PO TBCR
20.0000 meq | EXTENDED_RELEASE_TABLET | Freq: Every day | ORAL | Status: DC
  Administered 2015-05-21 – 2015-05-23 (×3): 20 meq via ORAL
  Filled 2015-05-20 (×3): qty 1

## 2015-05-20 MED ORDER — SODIUM CHLORIDE 0.9% FLUSH
3.0000 mL | Freq: Two times a day (BID) | INTRAVENOUS | Status: DC
  Administered 2015-05-20 – 2015-05-23 (×2): 3 mL via INTRAVENOUS

## 2015-05-20 MED ORDER — ISOSORBIDE MONONITRATE ER 30 MG PO TB24
30.0000 mg | ORAL_TABLET | Freq: Every day | ORAL | Status: DC
  Administered 2015-05-21 – 2015-05-23 (×3): 30 mg via ORAL
  Filled 2015-05-20 (×3): qty 1

## 2015-05-20 MED ORDER — CARVEDILOL 12.5 MG PO TABS
25.0000 mg | ORAL_TABLET | Freq: Two times a day (BID) | ORAL | Status: DC
  Administered 2015-05-21 – 2015-05-23 (×5): 25 mg via ORAL
  Filled 2015-05-20 (×5): qty 2

## 2015-05-20 MED ORDER — INSULIN ASPART 100 UNIT/ML ~~LOC~~ SOLN: 0.0000 [IU] | Freq: Three times a day (TID) | SUBCUTANEOUS | Status: DC

## 2015-05-20 MED ORDER — SPIRONOLACTONE 25 MG PO TABS
50.0000 mg | ORAL_TABLET | Freq: Every day | ORAL | Status: DC
  Administered 2015-05-21: 50 mg via ORAL
  Filled 2015-05-20: qty 2

## 2015-05-20 MED ORDER — LISINOPRIL 20 MG PO TABS: 40.0000 mg | ORAL_TABLET | Freq: Every day | ORAL | Status: DC

## 2015-05-20 MED ORDER — SODIUM CHLORIDE 0.9 % IV SOLN
INTRAVENOUS | Status: DC
  Administered 2015-05-20 – 2015-05-22 (×3): via INTRAVENOUS
  Administered 2015-05-22: 1000 mL via INTRAVENOUS

## 2015-05-20 MED ORDER — PANTOPRAZOLE SODIUM 40 MG PO TBEC
40.0000 mg | DELAYED_RELEASE_TABLET | Freq: Every day | ORAL | Status: DC
  Administered 2015-05-21 – 2015-05-23 (×3): 40 mg via ORAL
  Filled 2015-05-20 (×3): qty 1

## 2015-05-20 MED ORDER — ENOXAPARIN SODIUM 40 MG/0.4ML ~~LOC~~ SOLN
40.0000 mg | SUBCUTANEOUS | Status: DC
  Administered 2015-05-20 – 2015-05-22 (×3): 40 mg via SUBCUTANEOUS
  Filled 2015-05-20 (×3): qty 0.4

## 2015-05-20 MED ORDER — DOXAZOSIN MESYLATE 4 MG PO TABS
4.0000 mg | ORAL_TABLET | Freq: Every day | ORAL | Status: DC
  Administered 2015-05-20 – 2015-05-22 (×3): 4 mg via ORAL
  Filled 2015-05-20 (×3): qty 1

## 2015-05-20 MED ORDER — IPRATROPIUM BROMIDE 0.02 % IN SOLN: 0.5000 mg | RESPIRATORY_TRACT | Status: DC | PRN

## 2015-05-20 MED ORDER — TAMSULOSIN HCL 0.4 MG PO CAPS
0.4000 mg | ORAL_CAPSULE | Freq: Every day | ORAL | Status: DC
  Administered 2015-05-20 – 2015-05-22 (×3): 0.4 mg via ORAL
  Filled 2015-05-20 (×3): qty 1

## 2015-05-20 MED ORDER — CALCIUM CARBONATE-VITAMIN D 500-200 MG-UNIT PO TABS
1.0000 | ORAL_TABLET | Freq: Two times a day (BID) | ORAL | Status: DC
  Administered 2015-05-20 – 2015-05-23 (×6): 1 via ORAL
  Filled 2015-05-20 (×9): qty 1

## 2015-05-20 MED ORDER — HYDRALAZINE HCL 50 MG PO TABS
100.0000 mg | ORAL_TABLET | Freq: Three times a day (TID) | ORAL | Status: DC
  Administered 2015-05-20 – 2015-05-23 (×8): 100 mg via ORAL
  Filled 2015-05-20 (×8): qty 2

## 2015-05-20 MED ORDER — ALBUTEROL SULFATE (2.5 MG/3ML) 0.083% IN NEBU: 2.5000 mg | INHALATION_SOLUTION | RESPIRATORY_TRACT | Status: DC | PRN

## 2015-05-20 MED ORDER — OXYBUTYNIN CHLORIDE 5 MG PO TABS
5.0000 mg | ORAL_TABLET | Freq: Three times a day (TID) | ORAL | Status: DC
  Administered 2015-05-21 – 2015-05-23 (×7): 5 mg via ORAL
  Filled 2015-05-20 (×8): qty 1

## 2015-05-20 MED ORDER — SULFASALAZINE 500 MG PO TABS
2000.0000 mg | ORAL_TABLET | Freq: Two times a day (BID) | ORAL | Status: DC
  Administered 2015-05-20 – 2015-05-23 (×6): 2000 mg via ORAL
  Filled 2015-05-20 (×6): qty 4

## 2015-05-20 MED ORDER — ADULT MULTIVITAMIN W/MINERALS CH
1.0000 | ORAL_TABLET | Freq: Every day | ORAL | Status: DC
  Administered 2015-05-21 – 2015-05-23 (×3): 1 via ORAL
  Filled 2015-05-20 (×3): qty 1

## 2015-05-20 MED ORDER — ATORVASTATIN CALCIUM 10 MG PO TABS
20.0000 mg | ORAL_TABLET | Freq: Every day | ORAL | Status: DC
  Administered 2015-05-20 – 2015-05-22 (×3): 20 mg via ORAL
  Filled 2015-05-20 (×3): qty 2

## 2015-05-20 MED ORDER — AMLODIPINE BESYLATE 5 MG PO TABS
10.0000 mg | ORAL_TABLET | Freq: Every day | ORAL | Status: DC
  Administered 2015-05-21 – 2015-05-23 (×3): 10 mg via ORAL
  Filled 2015-05-20 (×3): qty 2

## 2015-05-20 MED ORDER — ONDANSETRON HCL 4 MG PO TABS: 4.0000 mg | ORAL_TABLET | Freq: Four times a day (QID) | ORAL | Status: DC | PRN

## 2015-05-20 MED ORDER — POLYETHYLENE GLYCOL 3350 17 G PO PACK: 17.0000 g | PACK | Freq: Every day | ORAL | Status: DC | PRN

## 2015-05-20 MED ORDER — ASPIRIN EC 81 MG PO TBEC
81.0000 mg | DELAYED_RELEASE_TABLET | Freq: Every day | ORAL | Status: DC
  Administered 2015-05-21 – 2015-05-23 (×3): 81 mg via ORAL
  Filled 2015-05-20 (×3): qty 1

## 2015-05-20 MED ORDER — ONDANSETRON HCL 4 MG/2ML IJ SOLN: 4.0000 mg | Freq: Four times a day (QID) | INTRAMUSCULAR | Status: DC | PRN

## 2015-05-20 MED ORDER — METOLAZONE 5 MG PO TABS
5.0000 mg | ORAL_TABLET | ORAL | Status: DC
  Administered 2015-05-21: 5 mg via ORAL
  Filled 2015-05-20: qty 1

## 2015-05-20 NOTE — ED Notes (Signed)
Pt given sandwich and drink.

## 2015-05-20 NOTE — ED Notes (Signed)
Per EMS, [facility] and patient had fallen today. EMS reports right sided weakness that has been consistent with his past hx, but became worse at 1030 this morning. Pt has hx of stroke. Vitals per EMS: 150/90, 18 RR, 145 CBG, 90 HR  Per PT, Pt saw the RN at facility this morning for routine evaluation. RN asked patient to come back in the afternoon to have a recheck in the blood pressure. Pt reports "feeling normal" at this time. When RN came to do rounds at 1430, pt attempted to walk and started to become weak in the right leg more than normal. RN called EMS for evaluation

## 2015-05-20 NOTE — ED Notes (Signed)
Attempted report 

## 2015-05-20 NOTE — ED Notes (Signed)
Patient taken to CT immediately due to Code Stroke

## 2015-05-20 NOTE — ED Notes (Signed)
Pt returned from MRI °

## 2015-05-20 NOTE — Consult Note (Deleted)
[facility] History and Physical  [name] ZOX:[id number] DOB: 01/22/1961 DOA: 05/20/2015  Referring physician: ED PCP: No primary care provider on file.   Chief Complaint:  Right leg weakness  HPI:  [name] with a pmh history HTN, BPH, CKD stage III, CHF, HTN, DMII, OSA, TIA; who presents with right leg weakness. Last seen normal at 12:30 when he sat down in a chair  . When he sat back up at 30 p.m. he had right-sided leg heaviness and as he was trying to walk to the next room fell and hit his head on the floor. He notes that he did not lose consciousness and was able to get back up still noting some right leg heaviness, but was able to bear weight. He reports decreased appetite 3-4 days ago with nausea and vomiting. He reports that those symptoms are now resolved. He currently resides at the service Center which is a halfway house as he reports being homeless. They provide the patient's medications daily. He notes that his kidney function has waxed and waned in the past and usually is related to dehydration.  Upon arrival to the emergency department patient was evaluated as a code stroke. CT scan showed focal loss of gray-white differentiation in the medial right occipital lobe suspicious for a acute or subacute cortical infarct with also a vague focal hypodensity in the left corona radiata which could represent acute or subacute infarct. Evaluated by neurology who recommended MRI if negative then no further workup needed. MRI was completed and showed no acute or acute abnormalities as to the cause of patient's symptoms.   Review of Systems  Constitutional: Negative for chills and weight loss.  HENT: Negative for hearing loss and tinnitus.   Eyes: Negative for double vision and photophobia.  Respiratory: Negative for hemoptysis and shortness of breath.   Cardiovascular: Positive for leg swelling. Negative for chest pain, palpitations and orthopnea.  Gastrointestinal: Positive for  nausea and vomiting. Negative for diarrhea and blood in stool.  Genitourinary: Negative for urgency and frequency.  Musculoskeletal: Positive for falls. Negative for back pain and neck pain.  Skin: Negative for itching and rash.  Neurological: Negative for tremors, sensory change and loss of consciousness.  Endo/Heme/Allergies: Negative for environmental allergies. Does not bruise/bleed easily.  Psychiatric/Behavioral: Negative for hallucinations and substance abuse.       Past Medical History  Diagnosis Date  . BPH (benign prostatic hyperplasia)   . CHF (congestive heart failure) (HCC)   . Hypertension   . Diabetes mellitus without complication (HCC)   . Diabetic retinopathy associated with type 2 diabetes mellitus (HCC)   . OSA (obstructive sleep apnea)   . Lymphedema   . TIA (transient ischemic attack)      Past Surgical History  Procedure Laterality Date  . Bladder surgery    . Cardiac catheterization        Social History:  reports that he has never smoked. He has never used smokeless tobacco. He reports that he does not drink alcohol or use illicit drugs. Where does patient live--group home   Can patient participate in ADLs? Yes  Allergies  Allergen Reactions  . Chicken Allergy Other (See Comments)    Sneezing     Family History  Problem Relation Age of Onset  . Cancer Mother   . Cancer Father         Prior to Admission medications   Medication Sig Start Date End Date Taking? Authorizing Provider  acetaminophen (TYLENOL) 325 MG  tablet Take 650 mg by mouth 3 (three) times daily as needed for mild pain, moderate pain or headache.    Yes Historical Provider, MD  amLODipine (NORVASC) 10 MG tablet Take 10 mg by mouth daily.   Yes Historical Provider, MD  aspirin EC 81 MG tablet Take 81 mg by mouth daily.   Yes Historical Provider, MD  atorvastatin (LIPITOR) 40 MG tablet Take 20 mg by mouth at bedtime.    Yes Historical Provider, MD  bumetanide (BUMEX) 2 MG  tablet Take 2 mg by mouth 2 (two) times daily.   Yes Historical Provider, MD  Calcium Carbonate-Vitamin D (CALCIUM-VITAMIN D) 500-200 MG-UNIT tablet Take 1 tablet by mouth 2 (two) times daily.   Yes Historical Provider, MD  carvedilol (COREG) 25 MG tablet Take 50 mg by mouth daily.    Yes Historical Provider, MD  citalopram (CELEXA) 40 MG tablet Take 40 mg by mouth daily.   Yes Historical Provider, MD  doxazosin (CARDURA) 8 MG tablet Take 4 mg by mouth at bedtime.    Yes Historical Provider, MD  ferrous sulfate 325 (65 FE) MG tablet Take 325 mg by mouth daily with breakfast.   Yes Historical Provider, MD  hydrALAZINE (APRESOLINE) 100 MG tablet Take 100 mg by mouth 3 (three) times daily.   Yes Historical Provider, MD  insulin aspart (NOVOLOG FLEXPEN) 100 UNIT/ML FlexPen Inject 3-13 Units into the skin 3 (three) times daily with meals. Inject 3 units plus adjustment per sliding scale : 0-200= 0 units 201-250= 2 units  251-300= 4 units 301-350= 6 units 351-400= 8 units 401-450= 10 units 451-999= CALL MD   Yes Historical Provider, MD  insulin glargine (LANTUS) 100 UNIT/ML injection Inject 15 Units into the skin at bedtime.    Yes Historical Provider, MD  isosorbide mononitrate (IMDUR) 30 MG 24 hr tablet Take 30 mg by mouth daily.   Yes Historical Provider, MD  lisinopril (PRINIVIL,ZESTRIL) 40 MG tablet Take 40 mg by mouth daily.   Yes Historical Provider, MD  metolazone (ZAROXOLYN) 5 MG tablet Take 5 mg by mouth every 3 (three) days.   Yes Historical Provider, MD  Multiple Vitamin (MULTIVITAMIN WITH MINERALS) TABS tablet Take 1 tablet by mouth daily.   Yes Historical Provider, MD  ondansetron (ZOFRAN ODT) 8 MG disintegrating tablet Take 1 tablet (8 mg total) by mouth every 8 (eight) hours as needed for nausea or vomiting. Patient taking differently: Take 4 mg by mouth 3 (three) times daily as needed for nausea or vomiting.  05/16/15  Yes Azalia Bilis, MD  oxybutynin (DITROPAN) 5 MG tablet Take 5 mg  by mouth 3 (three) times daily.   Yes Historical Provider, MD  pantoprazole (PROTONIX) 40 MG tablet Take 40 mg by mouth daily. Take on an empty stomach 30 minutes before a meal   Yes Historical Provider, MD  potassium chloride SA (K-DUR,KLOR-CON) 20 MEQ tablet Take 20 mEq by mouth daily.   Yes Historical Provider, MD  silver sulfADIAZINE (SILVADENE) 1 % cream Apply 1 application topically daily as needed (skin).   Yes Historical Provider, MD  spironolactone (ALDACTONE) 25 MG tablet Take 50 mg by mouth daily.    Yes Historical Provider, MD  sulfaSALAzine (AZULFIDINE) 500 MG tablet Take 2,000 mg by mouth 2 (two) times daily.    Yes Historical Provider, MD  tamsulosin (FLOMAX) 0.4 MG CAPS capsule Take 0.4 mg by mouth at bedtime.    Yes Historical Provider, MD  polyethylene glycol (MIRALAX / GLYCOLAX) packet Take 17  g by mouth daily as needed for mild constipation or moderate constipation.    Historical Provider, MD     Physical Exam: Filed Vitals:   05/20/15 1845 05/20/15 1915 05/20/15 1925 05/20/15 2000  BP: 181/93 170/92 170/92 177/96  Pulse: 51 70 71 72  Temp:      TempSrc:      Resp: 10 12 16 10   SpO2:  84% 98% 98%     Constitutional: Vital signs reviewed. Patient is a obese male who appears to be in no acute distress. Alert and oriented x3.  Head: Normocephalic and atraumatic   Ear: TM normal bilaterally  Mouth: no erythema or exudates, MMM  Eyes: PERRL, EOMI, conjunctivae normal, No scleral icterus.  Neck: Supple, Trachea midline normal ROM, No JVD, mass, thyromegaly, or carotid bruit present.  Cardiovascular: RRR, S1 normal, S2 normal, no MRG, pulses symmetric and intact bilaterally  Pulmonary/Chest: CTAB, no wheezes, rales, or rhonchi  Abdominal: Soft. Non-tender, non-distended, bowel sounds are normal, no masses, organomegaly, or guarding present.  GU: no CVA tenderness Musculoskeletal: No joint deformities, erythema, or stiffness, ROM full and no nontender Ext: +1 edema  pitting with chronic lymphedema and no cyanosis, pulses palpable bilaterally (DP and PT)  Hematology: no cervical, inginal, or axillary adenopathy.  Neurological: A&O x3, Strenght is normal and symmetric bilaterally, cranial nerve II-XII are grossly intact, no focal motor deficit, sensory intact to light touch bilaterally.  Skin: Warm, excessive dryness with flaking skin noted of the bilateral lower extremities. No rash, cyanosis, or clubbing.  Psychiatric: Normal mood and affect. speech and behavior is normal. Judgment and thought content normal. Cognition and memory are normal.      Data Review   Micro Results No results found for this or any previous visit (from the past 240 hour(s)).  Radiology Reports Dg Pelvis 1-2 Views  05/20/2015  CLINICAL DATA:  Right groin pain after fall. EXAM: PELVIS - 1-2 VIEW COMPARISON:  None. FINDINGS: No fracture, diastasis or focal osseous lesion. No evidence of hip dislocation on this frontal view. Mild-to-moderate osteoarthritis in the weight-bearing portions of both hip joints. Vascular calcifications throughout the soft tissues. Degenerative changes in the visualized lower lumbar spine. IMPRESSION: No pelvic fracture. Electronically Signed   By: Delbert Phenix M.D.   On: 05/20/2015 18:39   Ct Head Wo Contrast  05/20/2015  ADDENDUM REPORT: 05/20/2015 16:02 ADDENDUM: These results were called by telephone at the time of interpretation on 05/20/2015 at 3:54 pm to Dr. Amada Jupiter, who verbally acknowledged these results. Electronically Signed   By: Delbert Phenix M.D.   On: 05/20/2015 16:02  05/20/2015  CLINICAL DATA:  Code stroke. Right lower extremity and right upper extremity weakness. Fall. EXAM: CT HEAD WITHOUT CONTRAST TECHNIQUE: Contiguous axial images were obtained from the base of the skull through the vertex without intravenous contrast. COMPARISON:  None. FINDINGS: There is vague focal hypodensity in the left corona radiata. There is focal loss of  gray-white differentiation in the medial right occipital lobe (series 2/ image 13). No evidence of parenchymal hemorrhage or extra-axial fluid collection. No mass lesion, mass effect, or midline shift.Intracranial atherosclerosis. Nonspecific mild subcortical and periventricular white matter hypodensity, most in keeping with chronic small vessel ischemic change. Cerebral volume is age appropriate. No ventriculomegaly. The visualized paranasal sinuses are essentially clear. The mastoid air cells are unopacified. No evidence of calvarial fracture. IMPRESSION: 1. Focal loss of gray-white differentiation in the medial right occipital lobe, suspicious for acute or subacute cortical infarct. Vague focal  hypodensity in the left corona radiata, which could represent acute or subacute ischemia. Correlate with brain MRI as clinically warranted. 2. No acute intracranial hemorrhage. No mass effect or midline shift. 3. Intracranial atherosclerosis and mild chronic small vessel ischemia. Electronically Signed: By: Delbert Phenix M.D. On: 05/20/2015 15:59   Mr Brain Wo Contrast  05/20/2015  CLINICAL DATA:  Increasing RIGHT lower extremity weakness. History of remote cerebral infarction. EXAM: MRI HEAD WITHOUT CONTRAST TECHNIQUE: Multiplanar, multiecho pulse sequences of the brain and surrounding structures were obtained without intravenous contrast. COMPARISON:  CT head 05/20/2015. FINDINGS: No evidence for acute infarction, hemorrhage, mass lesion, hydrocephalus, or extra-axial fluid. Premature for age cerebral and cerebellar atrophy. Chronic microvascular ischemic change affects the periventricular and subcortical white matter. There is a chronic area of infarction affecting the RIGHT occipital lobe, identified on CT, without acute or subacute features. Remote deep white matter infarction affects the LEFT centrum semiovale, also without acute or subacute features. Chronic lacunar infarctions affect the LEFT paramedian pons.  Flow voids are maintained throughout. There is a chronic hemorrhage with encephalomalacia affecting the lentiform nucleus and regional white matter on the LEFT, likely hypertensive related. No other similar foci of susceptibility. Marked empty sella, with flattening of the gland and expansion. No tonsillar herniation. Upper cervical region unremarkable. Sequelae of prior LEFT ocular surgery. No sinus or mastoid air fluid levels. Extracranial soft tissues unremarkable. IMPRESSION: No acute intracranial findings. No cause is seen for increasing RIGHT leg weakness. Foci of chronic hemorrhage and chronic infarctions scattered throughout the brain, greater on the LEFT. Marked empty sella.  Correlate clinically as a cause of headache. Electronically Signed   By: Elsie Stain M.D.   On: 05/20/2015 17:32     CBC  Recent Labs Lab 05/16/15 1052 05/20/15 1545 05/20/15 1555  WBC 6.7 5.9  --   HGB 10.7* 10.3* 11.2*  HCT 31.6* 31.2* 33.0*  PLT 187 171  --   MCV 88.0 87.9  --   MCH 29.8 29.0  --   MCHC 33.9 33.0  --   RDW 14.1 13.8  --   LYMPHSABS  --  1.3  --   MONOABS  --  0.5  --   EOSABS  --  0.0  --   BASOSABS  --  0.0  --     Chemistries   Recent Labs Lab 05/16/15 1052 05/20/15 1545 05/20/15 1555  NA 144 139 140  K 4.0 4.3 4.3  CL 108 101 101  CO2 28 26  --   GLUCOSE 116* 146* 141*  Elliott 63* 48* 45*  CREATININE 1.29* 2.19* 2.10*  CALCIUM 9.7 9.5  --   AST 59* 43*  --   ALT 47 33  --   ALKPHOS 228* 196*  --   BILITOT 0.5 0.6  --    ------------------------------------------------------------------------------------------------------------------ estimated creatinine clearance is 51.2 mL/min (by C-G formula based on Cr of 2.1). ------------------------------------------------------------------------------------------------------------------ No results for input(s): HGBA1C in the last 72  hours. ------------------------------------------------------------------------------------------------------------------ No results for input(s): CHOL, HDL, LDLCALC, TRIG, CHOLHDL, LDLDIRECT in the last 72 hours. ------------------------------------------------------------------------------------------------------------------ No results for input(s): TSH, T4TOTAL, T3FREE, THYROIDAB in the last 72 hours.  Invalid input(s): FREET3 ------------------------------------------------------------------------------------------------------------------ No results for input(s): VITAMINB12, FOLATE, FERRITIN, TIBC, IRON, RETICCTPCT in the last 72 hours.  Coagulation profile  Recent Labs Lab 05/20/15 1545  INR 1.22    No results for input(s): DDIMER in the last 72 hours.  Cardiac Enzymes No results for input(s): CKMB, TROPONINI, MYOGLOBIN in the  last 168 hours.  Invalid input(s): CK ------------------------------------------------------------------------------------------------------------------ Invalid input(s): POCBNP   CBG:  Recent Labs Lab 05/20/15 1557  GLUCAP 120*       EKG: Independently reviewed. Sinus rhythm   Assessment/Plan Acute kidney Injury on chronic kidney disease stage III: Baseline Cr was around 1.26.  Cr is  2.1 and Elliott is 46 on admission. Likely due to prerenal failure secondary to dehydration and  diruetics and/or NSAIDs use.  - Hold diuretics now - IVF as above - Check FeUrea - US-renal - Follow up renal function by BMP - Holding diuretics and ACE-I  Right lower extremity weakness - Physical therapy to evaluate and treat in a.m.  Anemia: Hemoglobin 11.2 g/dl . Patient currently denies dark stools -  Continue to monitor with elevated Elliott  CHF -Continue carvedilol, hydralazine, Metolazone, isosorbide mononitrate,    Essential hypertension uncontrolled - Continue amlodipine and all other medications as stated above - Held Bumex and  lisinopril  Diabetes mellitus type II - CBGs every before meals and at bedtime with sliding scale insulin - Home regimen not restarted as patient reports decreased by mouth appetite recently  Lymphedema: Chronic. - Compression stockings  BPH: - Follow up renal ultrasound - Continue Flomax  OSA on CPAP - Cpap per respiratory therapy  Depression -Continue Celexa  Hyperlipidemia - Continue atorvastatin   Code Status:   full Family Communication: bedside Disposition Plan: admit   Total time spent 55 minutes.Greater than 50% of this time was spent in counseling, explanation of diagnosis, planning of further management, and coordination of care  Clydie Braun [facility] Pager [phone]  If 7PM-7AM, please contact night-coverage [email or web address] Password [facility] 05/20/2015, 8:19 PM

## 2015-05-20 NOTE — ED Notes (Signed)
Admitting MD at bedside.

## 2015-05-20 NOTE — ED Provider Notes (Addendum)
CSN: [id number]     Arrival date & time 05/20/15  1520 History   First MD Initiated Contact with Patient 05/20/15 1533     Chief Complaint  Patient presents with  . Extremity Weakness    L5 caveat acuity of situation.  (Consider location/radiation/quality/duration/timing/severity/associated sxs/prior Treatment) HPI Patient developed right-sided leg weakness 4:30 PM today while attempting to walk. States weakness with gradual onset. As a result of weakness he fell. He complains of pain at right groin and a "stiffness" in his right groin since since the fall. No treatment prior to coming here. No other injury. Stiffness of right groin is worse with movement improved from a still. No other associated symptoms Past Medical History  Diagnosis Date  . BPH (benign prostatic hyperplasia)   . CHF (congestive heart failure) (HCC)   . Hypertension   . Diabetes mellitus without complication (HCC)   . Diabetic retinopathy associated with type 2 diabetes mellitus (HCC)   . OSA (obstructive sleep apnea)   . Lymphedema   . 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 Past Surgical History  Procedure Laterality Date  . Bladder surgery    . Cardiac catheterization     Family History  Problem Relation Age of Onset  . Cancer Mother   . Cancer Father    Social History  Substance Use Topics  . Smoking status: Never Smoker   . Smokeless tobacco: Never Used  . Alcohol Use: No    Review of Systems  HENT: Negative.   Respiratory: Negative.   Cardiovascular: Positive for leg swelling.       Chronic bilateral lower extremity edema  Gastrointestinal: Negative.   Musculoskeletal: Positive for arthralgias and gait problem.       Right groin pain  Skin: Negative.   Neurological: Positive for weakness.  Psychiatric/Behavioral: Negative.   All other systems reviewed and are negative.     Allergies  Chicken allergy  Home Medications   Prior to  Admission medications   Medication Sig Start Date End Date Taking? Authorizing Provider  acetaminophen (TYLENOL) 325 MG tablet Take 650 mg by mouth 3 (three) times daily as needed for mild pain, moderate pain or headache.    Yes Historical Provider, MD  amLODipine (NORVASC) 10 MG tablet Take 10 mg by mouth daily.   Yes Historical Provider, MD  atorvastatin (LIPITOR) 40 MG tablet Take 20 mg by mouth at bedtime.    Yes Historical Provider, MD  bumetanide (BUMEX) 2 MG tablet Take 2 mg by mouth 2 (two) times daily.   Yes Historical Provider, MD  carvedilol (COREG) 25 MG tablet Take 50 mg by mouth daily.    Yes Historical Provider, MD  citalopram (CELEXA) 40 MG tablet Take 40 mg by mouth daily.   Yes Historical Provider, MD  doxazosin (CARDURA) 8 MG tablet Take 4 mg by mouth at bedtime.    Yes Historical Provider, MD  ferrous sulfate 325 (65 FE) MG tablet Take 325 mg by mouth daily with breakfast.   Yes Historical Provider, MD  hydrALAZINE (APRESOLINE) 100 MG tablet Take 100 mg by mouth 3 (three) times daily.   Yes Historical Provider, MD  isosorbide mononitrate (IMDUR) 30 MG 24 hr tablet Take 30 mg by mouth daily.   Yes Historical Provider, MD  lisinopril (PRINIVIL,ZESTRIL) 40 MG tablet Take 40 mg by mouth daily.   Yes Historical Provider, MD  metolazone (ZAROXOLYN) 5 MG tablet Take 5 mg by mouth every  3 (three) days.   Yes Historical Provider, MD  Multiple Vitamin (MULTIVITAMIN WITH MINERALS) TABS tablet Take 1 tablet by mouth daily.   Yes Historical Provider, MD  ondansetron (ZOFRAN ODT) 8 MG disintegrating tablet Take 1 tablet (8 mg total) by mouth every 8 (eight) hours as needed for nausea or vomiting. Patient taking differently: Take 4 mg by mouth 3 (three) times daily as needed for nausea or vomiting.  05/16/15  Yes Azalia Bilis, MD  oxybutynin (DITROPAN) 5 MG tablet Take 5 mg by mouth 3 (three) times daily.   Yes Historical Provider, MD  pantoprazole (PROTONIX) 40 MG tablet Take 40 mg by mouth  daily. Take on an empty stomach 30 minutes before a meal   Yes Historical Provider, MD  potassium chloride SA (K-DUR,KLOR-CON) 20 MEQ tablet Take 20 mEq by mouth daily.   Yes Historical Provider, MD  spironolactone (ALDACTONE) 25 MG tablet Take 50 mg by mouth daily.    Yes Historical Provider, MD  sulfaSALAzine (AZULFIDINE) 500 MG tablet Take 2,000 mg by mouth 2 (two) times daily.    Yes Historical Provider, MD  tamsulosin (FLOMAX) 0.4 MG CAPS capsule Take 0.4 mg by mouth at bedtime.    Yes Historical Provider, MD  aspirin EC 81 MG tablet Take 81 mg by mouth daily.    Historical Provider, MD  Calcium Carbonate-Vitamin D (CALCIUM-VITAMIN D) 500-200 MG-UNIT tablet Take 1 tablet by mouth 2 (two) times daily.    Historical Provider, MD  Insulin Aspart (NOVOLOG FLEXPEN [facility]) Inject 5 Units into the skin daily.    Historical Provider, MD  insulin aspart (NOVOLOG FLEXPEN) 100 UNIT/ML FlexPen Inject 2-10 Units into the skin 3 (three) times daily with meals. Per sliding scale : 0-200= 0 units 201-250= 2 units  251-300= 4 units 301-350= 6 units 351-400= 8 units 401-450= 10 units 451-999= CALL MD    Historical Provider, MD  insulin glargine (LANTUS) 100 UNIT/ML injection Inject 8 Units into the skin at bedtime.    Historical Provider, MD  polyethylene glycol (MIRALAX / GLYCOLAX) packet Take 17 g by mouth daily as needed for mild constipation or moderate constipation.    Historical Provider, MD  silver sulfADIAZINE (SILVADENE) 1 % cream Apply 1 application topically daily as needed (skin).    Historical Provider, MD   BP 169/97 mmHg  Pulse 70  Temp(Src) 97.6 F (36.4 C) (Oral)  Resp 11  SpO2 100% Physical Exam  Constitutional: He is oriented to person, place, and time.  Chronically ill-appearing  HENT:  Head: Normocephalic and atraumatic.  Eyes: Conjunctivae are normal. Pupils are equal, round, and reactive to light.  Neck: Neck supple. No tracheal deviation present. No thyromegaly present.   Cardiovascular: Normal rate and regular rhythm.   No murmur heard. Pulmonary/Chest: Effort normal and breath sounds normal.  Abdominal: Soft. Bowel sounds are normal. He exhibits no distension. There is no tenderness.  Obese  Musculoskeletal: Normal range of motion. He exhibits edema and tenderness.  Pelvis stable. Tender at right inguinal crease. No pain on internal or external rotation of either thigh. 3+ edema of bilateral lower extremities  Neurological: He is alert and oriented to person, place, and time. No cranial nerve deficit. Coordination normal.  Motor strength of right lower extremity 4/5. All other extremities 5 over 5  Skin: Skin is warm and dry. No rash noted.  2 cm abrasion over right anterior knee  Psychiatric: He has a normal mood and affect.  Nursing note and vitals reviewed.  ED Course  Procedures (including critical care time) Labs Review Labs Reviewed  PROTIME-INR - Abnormal; Notable for the following:    Prothrombin Time 15.5 (*)    All other components within normal limits  CBC - Abnormal; Notable for the following:    RBC 3.55 (*)    Hemoglobin 10.3 (*)    HCT 31.2 (*)    All other components within normal limits  COMPREHENSIVE METABOLIC PANEL - Abnormal; Notable for the following:    Glucose, Bld 146 (*)    BUN 48 (*)    Creatinine, Ser 2.19 (*)    Albumin 3.1 (*)    AST 43 (*)    Alkaline Phosphatase 196 (*)    GFR calc non Af Amer 32 (*)    GFR calc Af Amer 37 (*)    All other components within normal limits  CBG MONITORING, ED - Abnormal; Notable for the following:    Glucose-Capillary 120 (*)    All other components within normal limits  I-STAT CHEM 8, ED - Abnormal; Notable for the following:    BUN 45 (*)    Creatinine, Ser 2.10 (*)    Glucose, Bld 141 (*)    Hemoglobin 11.2 (*)    HCT 33.0 (*)    All other components within normal limits  APTT  DIFFERENTIAL  I-STAT TROPOININ, ED    Imaging Review Ct Head Wo Contrast  05/20/2015   ADDENDUM REPORT: 05/20/2015 16:02 ADDENDUM: These results were called by telephone at the time of interpretation on 05/20/2015 at 3:54 pm to Dr. Amada Jupiter, who verbally acknowledged these results. Electronically Signed   By: Delbert Phenix M.D.   On: 05/20/2015 16:02  05/20/2015  CLINICAL DATA:  Code stroke. Right lower extremity and right upper extremity weakness. Fall. EXAM: CT HEAD WITHOUT CONTRAST TECHNIQUE: Contiguous axial images were obtained from the base of the skull through the vertex without intravenous contrast. COMPARISON:  None. FINDINGS: There is vague focal hypodensity in the left corona radiata. There is focal loss of gray-white differentiation in the medial right occipital lobe (series 2/ image 13). No evidence of parenchymal hemorrhage or extra-axial fluid collection. No mass lesion, mass effect, or midline shift.Intracranial atherosclerosis. Nonspecific mild subcortical and periventricular white matter hypodensity, most in keeping with chronic small vessel ischemic change. Cerebral volume is age appropriate. No ventriculomegaly. The visualized paranasal sinuses are essentially clear. The mastoid air cells are unopacified. No evidence of calvarial fracture. IMPRESSION: 1. Focal loss of gray-white differentiation in the medial right occipital lobe, suspicious for acute or subacute cortical infarct. Vague focal hypodensity in the left corona radiata, which could represent acute or subacute ischemia. Correlate with brain MRI as clinically warranted. 2. No acute intracranial hemorrhage. No mass effect or midline shift. 3. Intracranial atherosclerosis and mild chronic small vessel ischemia. Electronically Signed: By: Delbert Phenix M.D. On: 05/20/2015 15:59   I have personally reviewed and evaluated these images and lab results as part of my medical decision-making.   EKG Interpretation   Date/Time:  Sunday May 20 2015 15:49:18 EDT Ventricular Rate:  68 PR Interval:  191 QRS Duration: 96 QT  Interval:  425 QTC Calculation: 452 R Axis:   15 Text Interpretation:  Sinus rhythm Atrial premature complex Probable left  ventricular hypertrophy Nonspecific T abnormalities, lateral leads ST  elevation, consider anterior injury Baseline wander in lead(s) V4 No  significant change since last tracing Confirmed by Ethelda Chick  MD, Demonie Kassa  [phone]) on 05/20/2015 4:04:58 PM  Results for orders placed or performed during the hospital encounter of 05/20/15  Protime-INR  Result Value Ref Range   Prothrombin Time 15.5 (H) 11.6 - 15.2 seconds   INR 1.22 0.00 - 1.49  APTT  Result Value Ref Range   aPTT 34 24 - 37 seconds  CBC  Result Value Ref Range   WBC 5.9 4.0 - 10.5 K/uL   RBC 3.55 (L) 4.22 - 5.81 MIL/uL   Hemoglobin 10.3 (L) 13.0 - 17.0 g/dL   HCT 78.2 (L) 95.6 - 21.3 %   MCV 87.9 78.0 - 100.0 fL   MCH 29.0 26.0 - 34.0 pg   MCHC 33.0 30.0 - 36.0 g/dL   RDW 08.6 57.8 - 46.9 %   Platelets 171 150 - 400 K/uL  Differential  Result Value Ref Range   Neutrophils Relative % 69 %   Neutro Abs 4.1 1.7 - 7.7 K/uL   Lymphocytes Relative 22 %   Lymphs Abs 1.3 0.7 - 4.0 K/uL   Monocytes Relative 8 %   Monocytes Absolute 0.5 0.1 - 1.0 K/uL   Eosinophils Relative 1 %   Eosinophils Absolute 0.0 0.0 - 0.7 K/uL   Basophils Relative 0 %   Basophils Absolute 0.0 0.0 - 0.1 K/uL  Comprehensive metabolic panel  Result Value Ref Range   Sodium 139 135 - 145 mmol/L   Potassium 4.3 3.5 - 5.1 mmol/L   Chloride 101 101 - 111 mmol/L   CO2 26 22 - 32 mmol/L   Glucose, Bld 146 (H) 65 - 99 mg/dL   BUN 48 (H) 6 - 20 mg/dL   Creatinine, Ser 6.29 (H) 0.61 - 1.24 mg/dL   Calcium 9.5 8.9 - 52.8 mg/dL   Total Protein 6.8 6.5 - 8.1 g/dL   Albumin 3.1 (L) 3.5 - 5.0 g/dL   AST 43 (H) 15 - 41 U/L   ALT 33 17 - 63 U/L   Alkaline Phosphatase 196 (H) 38 - 126 U/L   Total Bilirubin 0.6 0.3 - 1.2 mg/dL   GFR calc non Af Amer 32 (L) >60 mL/min   GFR calc Af Amer 37 (L) >60 mL/min   Anion gap 12 5 - 15   I-stat troponin, ED (not at [facility], [facility])  Result Value Ref Range   Troponin i, poc 0.06 0.00 - 0.08 ng/mL   Comment 3          CBG monitoring, ED  Result Value Ref Range   Glucose-Capillary 120 (H) 65 - 99 mg/dL  I-Stat Chem 8, ED  (not at [facility], [facility])  Result Value Ref Range   Sodium 140 135 - 145 mmol/L   Potassium 4.3 3.5 - 5.1 mmol/L   Chloride 101 101 - 111 mmol/L   BUN 45 (H) 6 - 20 mg/dL   Creatinine, Ser 4.13 (H) 0.61 - 1.24 mg/dL   Glucose, Bld 244 (H) 65 - 99 mg/dL   Calcium, Ion 0.10 1.12 - 1.23 mmol/L   TCO2 27 0 - 100 mmol/L   Hemoglobin 11.2 (L) 13.0 - 17.0 g/dL   HCT 27.2 (L) 53.6 - 64.4 %   Ct Head Wo Contrast  05/20/2015  ADDENDUM REPORT: 05/20/2015 16:02 ADDENDUM: These results were called by telephone at the time of interpretation on 05/20/2015 at 3:54 pm to Dr. Amada Jupiter, who verbally acknowledged these results. Electronically Signed   By: Delbert Phenix M.D.   On: 05/20/2015 16:02  05/20/2015  CLINICAL DATA:  Code stroke. Right lower extremity and right upper  extremity weakness. Fall. EXAM: CT HEAD WITHOUT CONTRAST TECHNIQUE: Contiguous axial images were obtained from the base of the skull through the vertex without intravenous contrast. COMPARISON:  None. FINDINGS: There is vague focal hypodensity in the left corona radiata. There is focal loss of gray-white differentiation in the medial right occipital lobe (series 2/ image 13). No evidence of parenchymal hemorrhage or extra-axial fluid collection. No mass lesion, mass effect, or midline shift.Intracranial atherosclerosis. Nonspecific mild subcortical and periventricular white matter hypodensity, most in keeping with chronic small vessel ischemic change. Cerebral volume is age appropriate. No ventriculomegaly. The visualized paranasal sinuses are essentially clear. The mastoid air cells are unopacified. No evidence of calvarial fracture. IMPRESSION: 1. Focal loss of gray-white differentiation in the medial right occipital  lobe, suspicious for acute or subacute cortical infarct. Vague focal hypodensity in the left corona radiata, which could represent acute or subacute ischemia. Correlate with brain MRI as clinically warranted. 2. No acute intracranial hemorrhage. No mass effect or midline shift. 3. Intracranial atherosclerosis and mild chronic small vessel ischemia. Electronically Signed: By: Delbert Phenix M.D. On: 05/20/2015 15:59   Dr. Amada Jupiter from neurology service consulted by me and evaluated patient in ED determined that patient has nonacute MRI scan of brain he needs no further evaluation for stroke. Patient signed out to Dr.Schossman at 5:25 PM who will make final disposition diagnostic testing is complete. Patient passed swallowing screen MDM Final diagnoses:  None    code stroke called upon arrival to room by me.  Patient does have acute kidney injury today with bump in creatinine over 4 days ago   diagnosis #1 right leg weakness #2 fall #3 abrasion to right knee #4 right groin pain #5 acute kidney injury #6 hyperglycemia    Doug Sou, MD 05/20/15 1733  Doug Sou, MD 05/20/15 1746

## 2015-05-20 NOTE — ED Notes (Signed)
CBG 120 

## 2015-05-20 NOTE — Consult Note (Signed)
Neurology Consultation Reason for Consult: Right leg weakness Referring Physician: Alden Server  CC: Right leg weakness  History is obtained from: Patient  HPI: [name] is a 55 y.o. male who was in his normal state of health until 12:30 PM. At that time, he sat down and did not notice anything wrong until he stood up to try and walk to see his nurse at 2:30 PM. He noticed at that time that every step felt very heavy and therefore he fell. After falling, his right leg hurt as well. He states that his right side feels slightly finding, but no weakness in his arm that is unusual.  Of note, he had a stroke in February 2016 at which time he was left with mild right-sided hemiparesis.   LKW: 12:30 PM tpa given?: no, mild symptoms    ROS: A 14 point ROS was performed and is negative except as noted in the HPI.   Past Medical History  Diagnosis Date  . BPH (benign prostatic hyperplasia)   . CHF (congestive heart failure) (HCC)   . Hypertension   . Diabetes mellitus without complication (HCC)   . Diabetic retinopathy associated with type 2 diabetes mellitus (HCC)   . OSA (obstructive sleep apnea)   . Lymphedema   . TIA (transient ischemic attack)      Family History  Problem Relation Age of Onset  . Cancer Mother   . Cancer Father      Social History:  reports that he has never smoked. He has never used smokeless tobacco. He reports that he does not drink alcohol or use illicit drugs.   Exam: Current vital signs: BP 169/97 mmHg  Pulse 70  Temp(Src) 97.6 F (36.4 C) (Oral)  Resp 11  SpO2 100% Vital signs in last 24 hours: Temp:  [97.6 F (36.4 C)] 97.6 F (36.4 C) (04/16 1615) Pulse Rate:  [67-70] 70 (04/16 1615) Resp:  [11-17] 11 (04/16 1615) BP: (151-169)/(84-97) 169/97 mmHg (04/16 1615) SpO2:  [97 %-100 %] 100 % (04/16 1615)   Physical Exam  Constitutional: Appears well-developed and well-nourished.  Psych: Affect appropriate to situation Eyes: No  scleral injection HENT: No OP obstrucion Head: Normocephalic.  Cardiovascular: Normal rate and regular rhythm.  Respiratory: Effort normal and breath sounds normal to anterior ascultation GI: Soft.  No distension. There is no tenderness.  Skin: WDI  Neuro: Mental Status: Patient is awake, alert, oriented to person, place, month, year, and situation. Patient is able to give a clear and coherent history. No signs of aphasia or neglect He does have a mild dysarthria Cranial Nerves: II: Visual Fields are full. Pupils are equal, round, and reactive to light.   III,IV, VI: EOMI without ptosis or diploplia.  V: Facial sensation is symmetric to temperature VII: Facial movement is symmetric.  VIII: hearing is intact to voice X: Uvula elevates symmetrically XI: Shoulder shrug is symmetric. XII: tongue is midline without atrophy or fasciculations.  Motor: Tone is normal. Bulk is normal. 5/5 strength was present on the left side, on the right side he has 4+/5 strength in his right arm without drift, he is able to keep his leg held aloft with only mild drift Sensory: Sensation is symmetric to pinprick, though decreased in a stocking distribution.  Cerebellar: FNF intact bilaterally    I have reviewed labs in epic and the results pertinent to this consultation are: BMP-elevated creatinine  I have reviewed the images obtained: CT head-age-indeterminate infarcts  Impression: 55 year old male with right  leg weakness concerning for new acute infarct. Also possible would be peeling the onion due to worsening of his kidney function. At this time, I would recommend an MRI to determine if he has indeed had new areas of infarct or not.  Recommendations: 1) MRI brain, further stroke workup with positive 2) if MRI is negative, then no further recommendations at this time.   Ritta Slot, MD [facility] [phone]  If 7pm- 7am, please page neurology on call as listed in  [facility].

## 2015-05-21 ENCOUNTER — Observation Stay (HOSPITAL_COMMUNITY): Payer: Medicare Other

## 2015-05-21 DIAGNOSIS — G4733 Obstructive sleep apnea (adult) (pediatric): Secondary | ICD-10-CM

## 2015-05-21 DIAGNOSIS — I130 Hypertensive heart and chronic kidney disease with heart failure and stage 1 through stage 4 chronic kidney disease, or unspecified chronic kidney disease: Secondary | ICD-10-CM | POA: Diagnosis present

## 2015-05-21 DIAGNOSIS — I509 Heart failure, unspecified: Secondary | ICD-10-CM | POA: Diagnosis present

## 2015-05-21 DIAGNOSIS — Z809 Family history of malignant neoplasm, unspecified: Secondary | ICD-10-CM | POA: Diagnosis not present

## 2015-05-21 DIAGNOSIS — I890 Lymphedema, not elsewhere classified: Secondary | ICD-10-CM | POA: Diagnosis present

## 2015-05-21 DIAGNOSIS — Z91018 Allergy to other foods: Secondary | ICD-10-CM | POA: Diagnosis not present

## 2015-05-21 DIAGNOSIS — F329 Major depressive disorder, single episode, unspecified: Secondary | ICD-10-CM | POA: Diagnosis present

## 2015-05-21 DIAGNOSIS — E11319 Type 2 diabetes mellitus with unspecified diabetic retinopathy without macular edema: Secondary | ICD-10-CM | POA: Diagnosis present

## 2015-05-21 DIAGNOSIS — D638 Anemia in other chronic diseases classified elsewhere: Secondary | ICD-10-CM | POA: Diagnosis present

## 2015-05-21 DIAGNOSIS — Z79899 Other long term (current) drug therapy: Secondary | ICD-10-CM | POA: Diagnosis not present

## 2015-05-21 DIAGNOSIS — Z794 Long term (current) use of insulin: Secondary | ICD-10-CM

## 2015-05-21 DIAGNOSIS — E119 Type 2 diabetes mellitus without complications: Secondary | ICD-10-CM | POA: Diagnosis present

## 2015-05-21 DIAGNOSIS — Z8673 Personal history of transient ischemic attack (TIA), and cerebral infarction without residual deficits: Secondary | ICD-10-CM | POA: Diagnosis not present

## 2015-05-21 DIAGNOSIS — N400 Enlarged prostate without lower urinary tract symptoms: Secondary | ICD-10-CM | POA: Diagnosis present

## 2015-05-21 DIAGNOSIS — Z5900 Homelessness unspecified: Secondary | ICD-10-CM

## 2015-05-21 DIAGNOSIS — E1310 Other specified diabetes mellitus with ketoacidosis without coma: Secondary | ICD-10-CM

## 2015-05-21 DIAGNOSIS — W1830XA Fall on same level, unspecified, initial encounter: Secondary | ICD-10-CM | POA: Diagnosis present

## 2015-05-21 DIAGNOSIS — N183 Chronic kidney disease, stage 3 (moderate): Secondary | ICD-10-CM | POA: Diagnosis present

## 2015-05-21 DIAGNOSIS — N179 Acute kidney failure, unspecified: Secondary | ICD-10-CM | POA: Diagnosis not present

## 2015-05-21 DIAGNOSIS — S80211A Abrasion, right knee, initial encounter: Secondary | ICD-10-CM | POA: Diagnosis present

## 2015-05-21 DIAGNOSIS — E669 Obesity, unspecified: Secondary | ICD-10-CM | POA: Diagnosis present

## 2015-05-21 DIAGNOSIS — Z7982 Long term (current) use of aspirin: Secondary | ICD-10-CM | POA: Diagnosis not present

## 2015-05-21 DIAGNOSIS — E1122 Type 2 diabetes mellitus with diabetic chronic kidney disease: Secondary | ICD-10-CM | POA: Diagnosis present

## 2015-05-21 DIAGNOSIS — R531 Weakness: Secondary | ICD-10-CM | POA: Diagnosis not present

## 2015-05-21 DIAGNOSIS — Z6834 Body mass index (BMI) 34.0-34.9, adult: Secondary | ICD-10-CM | POA: Diagnosis not present

## 2015-05-21 DIAGNOSIS — E785 Hyperlipidemia, unspecified: Secondary | ICD-10-CM | POA: Diagnosis present

## 2015-05-21 DIAGNOSIS — I10 Essential (primary) hypertension: Secondary | ICD-10-CM

## 2015-05-21 DIAGNOSIS — E860 Dehydration: Secondary | ICD-10-CM | POA: Diagnosis present

## 2015-05-21 LAB — BASIC METABOLIC PANEL
ANION GAP: 8 (ref 5–15)
BUN: 41 mg/dL — ABNORMAL HIGH (ref 6–20)
CALCIUM: 9.1 mg/dL (ref 8.9–10.3)
CO2: 28 mmol/L (ref 22–32)
Chloride: 106 mmol/L (ref 101–111)
Creatinine, Ser: 1.64 mg/dL — ABNORMAL HIGH (ref 0.61–1.24)
GFR calc Af Amer: 53 mL/min — ABNORMAL LOW (ref >60)
GFR calc non Af Amer: 46 mL/min — ABNORMAL LOW (ref >60)
GLUCOSE: 81 mg/dL (ref 65–99)
Potassium: 3.8 mmol/L (ref 3.5–5.1)
Sodium: 142 mmol/L (ref 135–145)

## 2015-05-21 LAB — CBC
HCT: 29.7 % — ABNORMAL LOW (ref 39.0–52.0)
Hemoglobin: 9.6 g/dL — ABNORMAL LOW (ref 13.0–17.0)
MCH: 28.4 pg (ref 26.0–34.0)
MCHC: 32.3 g/dL (ref 30.0–36.0)
MCV: 87.9 fL (ref 78.0–100.0)
PLATELETS: 158 10*3/uL (ref 150–400)
RBC: 3.38 MIL/uL — ABNORMAL LOW (ref 4.22–5.81)
RDW: 13.9 % (ref 11.5–15.5)
WBC: 5.1 10*3/uL (ref 4.0–10.5)

## 2015-05-21 LAB — GLUCOSE, CAPILLARY
GLUCOSE-CAPILLARY: 98 mg/dL (ref 65–99)
GLUCOSE-CAPILLARY: 116 mg/dL — AB (ref 65–99)
GLUCOSE-CAPILLARY: 133 mg/dL — AB (ref 65–99)
Glucose-Capillary: 86 mg/dL (ref 65–99)

## 2015-05-21 LAB — MRSA PCR SCREENING: MRSA by PCR: NEGATIVE

## 2015-05-21 MED ORDER — LISINOPRIL 5 MG PO TABS
5.0000 mg | ORAL_TABLET | Freq: Every day | ORAL | Status: DC
  Administered 2015-05-21 – 2015-05-23 (×3): 5 mg via ORAL
  Filled 2015-05-21 (×3): qty 1

## 2015-05-21 MED ORDER — INSULIN GLARGINE 100 UNIT/ML ~~LOC~~ SOLN
10.0000 [IU] | Freq: Every day | SUBCUTANEOUS | Status: DC
  Administered 2015-05-21 – 2015-05-22 (×2): 10 [IU] via SUBCUTANEOUS
  Filled 2015-05-21 (×3): qty 0.1

## 2015-05-21 NOTE — Care Management Obs Status (Addendum)
MEDICARE OBSERVATION STATUS NOTIFICATION   Patient Details  Name: [name] MRN: [id number] Date of Birth: [birth year]   Medicare Observation Status Notification Given:  Yes    Epifanio Lesches, RN 05/21/2015, 8:43 AM

## 2015-05-21 NOTE — Progress Notes (Signed)
10:04 AM I agree with HPI/GPe and A/P per Dr. Angela Burke in for SOB, weak and fall Eating and drinking now slightly   HEENT no ict/ no pallor CHEST clear CARDIAC s1 s2 no m/r/g ABDOMEN soft nt nd   Patient Active Problem List   Diagnosis Date Noted  . AKI (acute kidney injury) ([place]) 05/20/2015     baseline eGFR unknown-Cc the veteran affairs at [facility]. Jaynie Collins are pending With dm/htn/BPH/obst uropathy--All potential causes for renal insufficiency UA + for proteinura-hold US kidney for now -Fractional Excretion 42fSodium (FENa) = Unreliable as patient is on Bumex as well as metolazone -Hold all diuretics including Aldactone 50 daily Cut back dose lisinopril 40-->5 mg daily   . Weakness of right lower extremity Therapy to evaluate the patient- Social work aware that patient may need placement and short-term rehabilitation 05/20/2015      . Anemia of chronic disease-Normocytic borderline microcytic Hemoglobin 11.2-9.6 Likely dilutional Colonoscopy outpatient Cont Feso4  05/21/2015  . DM (diabetes mellitus) type 2, uncontrolled, with ketoacidosis (HCC) Continue sliding scale insulin Continue Lantus 15 every afternoon.  05/21/2015  . OSA ? Compliance -use 05/21/2015  . [place] 05/21/2015  . Essential hypertension -see above 05/20/2015  . CHF (congestive heart failure) (HCC) -coreg 25 bid, hydralazine-100 tid Continue Imdur 30 daily, amlodipine 10 -Await records to see above discussion-appears to have heart failure of unknown etiology -Awaiting records as above   05/20/2015    JVerneita Griffes MD [facility] [phone]

## 2015-05-21 NOTE — Progress Notes (Signed)
UR COMPLETED  

## 2015-05-21 NOTE — Clinical Documentation Improvement (Signed)
Hospitalist  Can the diagnosis of CHF be further specified?    Acuity - Acute, Chronic, Acute on Chronic   Type - Systolic, Diastolic, Systolic and Diastolic  Other  Clinically Undetermined   Document any associated diagnoses/conditions   Supporting Information: CHF (congestive heart failure) (HCC) - coreg 25 bid, hydralazine-100 tid Continue Imdur 30 daily, amlodipine 10 - Await records to see above discussion-appears to have heart failure of unknown etiology per 4/17 progress notes.   Please exercise your independent, professional judgment when responding. A specific answer is not anticipated or expected.   Thank Sabino Donovan [facility] [place] [phone]

## 2015-05-21 NOTE — Progress Notes (Signed)
Interval History:                                                                                                                      [name] is an 55 y.o. 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 He was brought to ED due to again noting right sided weakness. At this time his symptoms have fully resolved.    Past Medical History: Past Medical History  Diagnosis Date  . BPH (benign prostatic hyperplasia)   . CHF (congestive heart failure) (HCC)   . Hypertension   . Diabetes mellitus without complication (HCC)   . Diabetic retinopathy associated with type 2 diabetes mellitus (HCC)   . OSA (obstructive sleep apnea)   . Lymphedema   . TIA (transient ischemic attack)     Past Surgical History  Procedure Laterality Date  . Bladder surgery    . Cardiac catheterization      Family History: Family History  Problem Relation Age of Onset  . Cancer Mother   . Cancer Father     Social History:   reports that he has never smoked. He has never used smokeless tobacco. He reports that he does not drink alcohol or use illicit drugs.  Allergies:  Allergies  Allergen Reactions  . Chicken Allergy Other (See Comments)    Sneezing      Medications:                                                                                                                         Current facility-administered medications:  .  0.9 %  sodium chloride infusion, , Intravenous, Continuous, Clydie Braun, MD, Last Rate: 100 mL/hr at 05/21/15 0713 .  acetaminophen (TYLENOL) tablet 650 mg, 650 mg, Oral, TID PRN, Clydie Braun, MD .  albuterol (PROVENTIL) (2.5 MG/3ML) 0.083% nebulizer solution 2.5 mg, 2.5 mg, Nebulization, Q2H PRN, Clydie Braun, MD .  amLODipine (NORVASC) tablet 10 mg, 10 mg, Oral, Daily, Clydie Braun, MD .  aspirin EC tablet 81 mg, 81 mg, Oral, Daily, Rondell A Shine Mikes, MD .  atorvastatin (LIPITOR) tablet 20 mg, 20 mg, Oral,  QHS, Clydie Braun, MD, 20 mg at 05/20/15 2205 .  calcium-vitamin D (OSCAL WITH D) 500-200 MG-UNIT per tablet 1 tablet, 1 tablet, Oral, BID, Clydie Braun, MD, 1 tablet at 05/20/15 2205 .  carvedilol (COREG) tablet 25 mg, 25 mg,  Oral, BID, Clydie Braun, MD .  citalopram (CELEXA) tablet 40 mg, 40 mg, Oral, Daily, Rondell A Kadasia Kassing, MD .  doxazosin (CARDURA) tablet 4 mg, 4 mg, Oral, QHS, Clydie Braun, MD, 4 mg at 05/20/15 2206 .  enoxaparin (LOVENOX) injection 40 mg, 40 mg, Subcutaneous, Q24H, Clydie Braun, MD, 40 mg at 05/20/15 2205 .  ferrous sulfate tablet 325 mg, 325 mg, Oral, Q breakfast, Rondell A Jaxsyn Azam, MD .  hydrALAZINE (APRESOLINE) tablet 100 mg, 100 mg, Oral, TID, Clydie Braun, MD, 100 mg at 05/20/15 2205 .  insulin aspart (novoLOG) injection 0-15 Units, 0-15 Units, Subcutaneous, TID WC, Rondell A Zanovia Rotz, MD .  ipratropium (ATROVENT) nebulizer solution 0.5 mg, 0.5 mg, Nebulization, Q2H PRN, Clydie Braun, MD .  isosorbide mononitrate (IMDUR) 24 hr tablet 30 mg, 30 mg, Oral, Daily, Rondell A Katrinka Blazing, MD .  metolazone (ZAROXOLYN) tablet 5 mg, 5 mg, Oral, Q72H, Clydie Braun, MD .  multivitamin with minerals tablet 1 tablet, 1 tablet, Oral, Daily, Rondell A Ariannah Arenson, MD .  ondansetron (ZOFRAN) tablet 4 mg, 4 mg, Oral, Q6H PRN **OR** ondansetron (ZOFRAN) injection 4 mg, 4 mg, Intravenous, Q6H PRN, Clydie Braun, MD .  oxybutynin (DITROPAN) tablet 5 mg, 5 mg, Oral, TID, Rondell A Finley Chevez, MD .  pantoprazole (PROTONIX) EC tablet 40 mg, 40 mg, Oral, Daily, Rondell A Nyheim Seufert, MD .  polyethylene glycol (MIRALAX / GLYCOLAX) packet 17 g, 17 g, Oral, Daily PRN, Clydie Braun, MD .  potassium chloride SA (K-DUR,KLOR-CON) CR tablet 20 mEq, 20 mEq, Oral, Daily, Rondell A Ritchie Klee, MD .  sodium chloride flush (NS) 0.9 % injection 3 mL, 3 mL, Intravenous, Q12H, Clydie Braun, MD, 3 mL at 05/20/15 2157 .  spironolactone (ALDACTONE) tablet 50 mg, 50 mg, Oral, Daily, Rondell A Doralene Glanz, MD .   sulfaSALAzine (AZULFIDINE) tablet 2,000 mg, 2,000 mg, Oral, BID, Clydie Braun, MD, 2,000 mg at 05/20/15 2205 .  tamsulosin (FLOMAX) capsule 0.4 mg, 0.4 mg, Oral, QHS, Rondell A Katrinka Blazing, MD, 0.4 mg at 05/20/15 2205   Neurologic Examination:                                                                                                     Today's Vitals   05/20/15 2144 05/20/15 2348 05/21/15 0612 05/21/15 0616  BP:    165/87  Pulse:  69  73  Temp: 97.7 F (36.5 C)  98.7 F (37.1 C)   TempSrc: Oral  Oral   Resp:  16  20  SpO2:  96%  99%  PainSc:        Evaluation of higher integrative functions including: Level of alertness: Alert,  Oriented to time, place and person Speech: fluent, no evidence of dysarthria or aphasia noted.  Test the following cranial nerves: 2-12 grossly intact Motor examination: Normal tone, bulk, full 5/5 motor strength in all 4 extremities Examination of sensation : Normal and symmetric sensation to pinprick in all 4 extremities and on face Examination of deep tendon reflexes: 1+, normal and symmetric in all extremities, normal  plantars bilaterally Test coordination: Normal finger nose testing, with no evidence of limb appendicular ataxia or abnormal involuntary movements or tremors noted.  Gait: Deferred   Lab Results: Basic Metabolic Panel:  Recent Labs Lab 05/16/15 1052 05/20/15 1545 05/20/15 1555 05/21/15 0547  NA 144 139 140 142  K 4.0 4.3 4.3 3.8  CL 108 101 101 106  CO2 28 26  --  28  GLUCOSE 116* 146* 141* 81  BUN 63* 48* 45* 41*  CREATININE 1.29* 2.19* 2.10* 1.64*  CALCIUM 9.7 9.5  --  9.1    Liver Function Tests:  Recent Labs Lab 05/16/15 1052 05/20/15 1545  AST 59* 43*  ALT 47 33  ALKPHOS 228* 196*  BILITOT 0.5 0.6  PROT 7.1 6.8  ALBUMIN 3.3* 3.1*   No results for input(s): LIPASE, AMYLASE in the last 168 hours. No results for input(s): AMMONIA in the last 168 hours.  CBC:  Recent Labs Lab 05/16/15 1052  05/20/15 1545 05/20/15 1555 05/21/15 0547  WBC 6.7 5.9  --  5.1  NEUTROABS  --  4.1  --   --   HGB 10.7* 10.3* 11.2* 9.6*  HCT 31.6* 31.2* 33.0* 29.7*  MCV 88.0 87.9  --  87.9  PLT 187 171  --  158    Cardiac Enzymes: No results for input(s): CKTOTAL, CKMB, CKMBINDEX, TROPONINI in the last 168 hours.  Lipid Panel: No results for input(s): CHOL, TRIG, HDL, CHOLHDL, VLDL, LDLCALC in the last 168 hours.  CBG:  Recent Labs Lab 05/20/15 1557 05/21/15 0754  GLUCAP 120* 86    Microbiology: Results for orders placed or performed during the hospital encounter of 05/20/15  MRSA PCR Screening     Status: None   Collection Time: 05/20/15 11:00 PM  Result Value Ref Range Status   MRSA by PCR NEGATIVE NEGATIVE Final    Comment:        The GeneXpert MRSA Assay (FDA approved for NASAL specimens only), is one component of a comprehensive MRSA colonization surveillance program. It is not intended to diagnose MRSA infection nor to guide or monitor treatment for MRSA infections.     Imaging: Dg Pelvis 1-2 Views  05/20/2015  CLINICAL DATA:  Right groin pain after fall. EXAM: PELVIS - 1-2 VIEW COMPARISON:  None. FINDINGS: No fracture, diastasis or focal osseous lesion. No evidence of hip dislocation on this frontal view. Mild-to-moderate osteoarthritis in the weight-bearing portions of both hip joints. Vascular calcifications throughout the soft tissues. Degenerative changes in the visualized lower lumbar spine. IMPRESSION: No pelvic fracture. Electronically Signed   By: Delbert Phenix M.D.   On: 05/20/2015 18:39   Ct Head Wo Contrast  05/20/2015  ADDENDUM REPORT: 05/20/2015 16:02 ADDENDUM: These results were called by telephone at the time of interpretation on 05/20/2015 at 3:54 pm to Dr. Amada Jupiter, who verbally acknowledged these results. Electronically Signed   By: Delbert Phenix M.D.   On: 05/20/2015 16:02  05/20/2015  CLINICAL DATA:  Code stroke. Right lower extremity and right upper  extremity weakness. Fall. EXAM: CT HEAD WITHOUT CONTRAST TECHNIQUE: Contiguous axial images were obtained from the base of the skull through the vertex without intravenous contrast. COMPARISON:  None. FINDINGS: There is vague focal hypodensity in the left corona radiata. There is focal loss of gray-white differentiation in the medial right occipital lobe (series 2/ image 13). No evidence of parenchymal hemorrhage or extra-axial fluid collection. No mass lesion, mass effect, or midline shift.Intracranial atherosclerosis. Nonspecific mild subcortical and periventricular white  matter hypodensity, most in keeping with chronic small vessel ischemic change. Cerebral volume is age appropriate. No ventriculomegaly. The visualized paranasal sinuses are essentially clear. The mastoid air cells are unopacified. No evidence of calvarial fracture. IMPRESSION: 1. Focal loss of gray-white differentiation in the medial right occipital lobe, suspicious for acute or subacute cortical infarct. Vague focal hypodensity in the left corona radiata, which could represent acute or subacute ischemia. Correlate with brain MRI as clinically warranted. 2. No acute intracranial hemorrhage. No mass effect or midline shift. 3. Intracranial atherosclerosis and mild chronic small vessel ischemia. Electronically Signed: By: Delbert Phenix M.D. On: 05/20/2015 15:59   Mr Brain Wo Contrast  05/20/2015  CLINICAL DATA:  Increasing RIGHT lower extremity weakness. History of remote cerebral infarction. EXAM: MRI HEAD WITHOUT CONTRAST TECHNIQUE: Multiplanar, multiecho pulse sequences of the brain and surrounding structures were obtained without intravenous contrast. COMPARISON:  CT head 05/20/2015. FINDINGS: No evidence for acute infarction, hemorrhage, mass lesion, hydrocephalus, or extra-axial fluid. Premature for age cerebral and cerebellar atrophy. Chronic microvascular ischemic change affects the periventricular and subcortical white matter. There is a  chronic area of infarction affecting the RIGHT occipital lobe, identified on CT, without acute or subacute features. Remote deep white matter infarction affects the LEFT centrum semiovale, also without acute or subacute features. Chronic lacunar infarctions affect the LEFT paramedian pons. Flow voids are maintained throughout. There is a chronic hemorrhage with encephalomalacia affecting the lentiform nucleus and regional white matter on the LEFT, likely hypertensive related. No other similar foci of susceptibility. Marked empty sella, with flattening of the gland and expansion. No tonsillar herniation. Upper cervical region unremarkable. Sequelae of prior LEFT ocular surgery. No sinus or mastoid air fluid levels. Extracranial soft tissues unremarkable. IMPRESSION: No acute intracranial findings. No cause is seen for increasing RIGHT leg weakness. Foci of chronic hemorrhage and chronic infarctions scattered throughout the brain, greater on the LEFT. Marked empty sella.  Correlate clinically as a cause of headache. Electronically Signed   By: Elsie Stain M.D.   On: 05/20/2015 17:32   US Renal  05/21/2015  CLINICAL DATA:  Acute renal failure. History of hypertension and diabetes. EXAM: RENAL / URINARY TRACT ULTRASOUND COMPLETE COMPARISON:  None. FINDINGS: Right Kidney: Length: 12.8 cm. Slightly increased renal parenchymal echotexture without parenchymal thinning. This may be associated with medical renal disease. No hydronephrosis or mass lesion. Left Kidney: Length: 11.8 cm. Echogenicity within normal limits. No mass or hydronephrosis visualized. Bladder: Appears normal for degree of bladder distention. IMPRESSION: Slight increase renal parenchymal echotexture on the right may be associated with medical renal disease. Kidneys and bladder are otherwise normal. No hydronephrosis. Electronically Signed   By: Burman Nieves M.D.   On: 05/21/2015 02:26    Assessment and plan:   Kayveon Guiang is an 55 y.o.  male patient with right sided weakness which has resolved and MRI is negative for acute infarct. Likely peeling of the onion due to worsening kidney function.   Will be seen by Dr. Lavon Paganini. Please see his attestation note for A/P for any additional work up recommendations.

## 2015-05-21 NOTE — H&P (Addendum)
[facility] History and Physical  [name] JXB:[id number] DOB: 17-Dec-1960 DOA: 05/20/2015  Referring physician: ED PCP: No primary care provider on file.   Chief Complaint:  Right leg weakness  HPI:  [name] with a pmh history HTN, BPH, CKD stage III, CHF, HTN, DMII, OSA, TIA; who presents with right leg weakness. Last seen normal at 12:30 when he sat down in a chair  . When he got back up at around 2:30 p.m. he had right-sided leg heaviness and as he was trying to walk to the next room fell and hit his head on the floor. He notes that he did not lose consciousness and was able to get back up still noting some right leg heaviness, but was able to bear weight. He reports decreased appetite 3-4 days ago with nausea and vomiting. He reports that those symptoms are now resolved. He currently resides at the service Center which is a halfway house as he reports being homeless. They provide the patient's medications daily. He notes that his kidney function has waxed and waned in the past and usually is related to dehydration.  Upon arrival to the emergency department patient was evaluated as a code stroke. CT scan showed focal loss of gray-white differentiation in the medial right occipital lobe suspicious for a acute or subacute cortical infarct with also a vague focal hypodensity in the left corona radiata which could represent acute or subacute infarct. Evaluated by neurology who recommended MRI if negative then no further workup needed. MRI was completed and showed no acute or acute abnormalities as to the cause of patient's symptoms.   Review of Systems  Constitutional: Negative for chills and weight loss.  HENT: Negative for hearing loss and tinnitus.   Eyes: Negative for double vision and photophobia.  Respiratory: Negative for hemoptysis and shortness of breath.   Cardiovascular: Positive for leg swelling. Negative for chest pain, palpitations and orthopnea.  Gastrointestinal:  Positive for nausea and vomiting. Negative for diarrhea and blood in stool.  Genitourinary: Negative for urgency and frequency.  Musculoskeletal: Positive for falls. Negative for back pain and neck pain.  Skin: Negative for itching and rash.  Neurological: Negative for tremors, sensory change and loss of consciousness.  Endo/Heme/Allergies: Negative for environmental allergies. Does not bruise/bleed easily.  Psychiatric/Behavioral: Negative for hallucinations and substance abuse.       Past Medical History  Diagnosis Date  . BPH (benign prostatic hyperplasia)   . CHF (congestive heart failure) (HCC)   . Hypertension   . Diabetes mellitus without complication (HCC)   . Diabetic retinopathy associated with type 2 diabetes mellitus (HCC)   . OSA (obstructive sleep apnea)   . Lymphedema   . TIA (transient ischemic attack)      Past Surgical History  Procedure Laterality Date  . Bladder surgery    . Cardiac catheterization        Social History:  reports that he has never smoked. He has never used smokeless tobacco. He reports that he does not drink alcohol or use illicit drugs. Where does patient live--group home   Can patient participate in ADLs? Yes  Allergies  Allergen Reactions  . Chicken Allergy Other (See Comments)    Sneezing     Family History  Problem Relation Age of Onset  . Cancer Mother   . Cancer Father         Prior to Admission medications   Medication Sig Start Date End Date Taking? Authorizing Provider  acetaminophen (TYLENOL) 325  MG tablet Take 650 mg by mouth 3 (three) times daily as needed for mild pain, moderate pain or headache.    Yes Historical Provider, MD  amLODipine (NORVASC) 10 MG tablet Take 10 mg by mouth daily.   Yes Historical Provider, MD  aspirin EC 81 MG tablet Take 81 mg by mouth daily.   Yes Historical Provider, MD  atorvastatin (LIPITOR) 40 MG tablet Take 20 mg by mouth at bedtime.    Yes Historical Provider, MD  bumetanide  (BUMEX) 2 MG tablet Take 2 mg by mouth 2 (two) times daily.   Yes Historical Provider, MD  Calcium Carbonate-Vitamin D (CALCIUM-VITAMIN D) 500-200 MG-UNIT tablet Take 1 tablet by mouth 2 (two) times daily.   Yes Historical Provider, MD  carvedilol (COREG) 25 MG tablet Take 50 mg by mouth daily.    Yes Historical Provider, MD  citalopram (CELEXA) 40 MG tablet Take 40 mg by mouth daily.   Yes Historical Provider, MD  doxazosin (CARDURA) 8 MG tablet Take 4 mg by mouth at bedtime.    Yes Historical Provider, MD  ferrous sulfate 325 (65 FE) MG tablet Take 325 mg by mouth daily with breakfast.   Yes Historical Provider, MD  hydrALAZINE (APRESOLINE) 100 MG tablet Take 100 mg by mouth 3 (three) times daily.   Yes Historical Provider, MD  insulin aspart (NOVOLOG FLEXPEN) 100 UNIT/ML FlexPen Inject 3-13 Units into the skin 3 (three) times daily with meals. Inject 3 units plus adjustment per sliding scale : 0-200= 0 units 201-250= 2 units  251-300= 4 units 301-350= 6 units 351-400= 8 units 401-450= 10 units 451-999= CALL MD   Yes Historical Provider, MD  insulin glargine (LANTUS) 100 UNIT/ML injection Inject 15 Units into the skin at bedtime.    Yes Historical Provider, MD  isosorbide mononitrate (IMDUR) 30 MG 24 hr tablet Take 30 mg by mouth daily.   Yes Historical Provider, MD  lisinopril (PRINIVIL,ZESTRIL) 40 MG tablet Take 40 mg by mouth daily.   Yes Historical Provider, MD  metolazone (ZAROXOLYN) 5 MG tablet Take 5 mg by mouth every 3 (three) days.   Yes Historical Provider, MD  Multiple Vitamin (MULTIVITAMIN WITH MINERALS) TABS tablet Take 1 tablet by mouth daily.   Yes Historical Provider, MD  ondansetron (ZOFRAN ODT) 8 MG disintegrating tablet Take 1 tablet (8 mg total) by mouth every 8 (eight) hours as needed for nausea or vomiting. Patient taking differently: Take 4 mg by mouth 3 (three) times daily as needed for nausea or vomiting.  05/16/15  Yes Azalia Bilis, MD  oxybutynin (DITROPAN) 5 MG  tablet Take 5 mg by mouth 3 (three) times daily.   Yes Historical Provider, MD  pantoprazole (PROTONIX) 40 MG tablet Take 40 mg by mouth daily. Take on an empty stomach 30 minutes before a meal   Yes Historical Provider, MD  potassium chloride SA (K-DUR,KLOR-CON) 20 MEQ tablet Take 20 mEq by mouth daily.   Yes Historical Provider, MD  silver sulfADIAZINE (SILVADENE) 1 % cream Apply 1 application topically daily as needed (skin).   Yes Historical Provider, MD  spironolactone (ALDACTONE) 25 MG tablet Take 50 mg by mouth daily.    Yes Historical Provider, MD  sulfaSALAzine (AZULFIDINE) 500 MG tablet Take 2,000 mg by mouth 2 (two) times daily.    Yes Historical Provider, MD  tamsulosin (FLOMAX) 0.4 MG CAPS capsule Take 0.4 mg by mouth at bedtime.    Yes Historical Provider, MD  polyethylene glycol (MIRALAX / GLYCOLAX) packet Take  17 g by mouth daily as needed for mild constipation or moderate constipation.    Historical Provider, MD     Physical Exam: Filed Vitals:   05/20/15 1845 05/20/15 1915 05/20/15 1925 05/20/15 2000  BP: 181/93 170/92 170/92 177/96  Pulse: 51 70 71 72  Temp:      TempSrc:      Resp: 10 12 16 10   SpO2:  84% 98% 98%     Constitutional: Vital signs reviewed. Patient is a obese male who appears to be in no acute distress. Alert and oriented x3.  Head: Normocephalic and atraumatic   Ear: TM normal bilaterally  Mouth: no erythema or exudates, MMM  Eyes: PERRL, EOMI, conjunctivae normal, No scleral icterus.  Neck: Supple, Trachea midline normal ROM, No JVD, mass, thyromegaly, or carotid bruit present.  Cardiovascular: RRR, S1 normal, S2 normal, no MRG, pulses symmetric and intact bilaterally  Pulmonary/Chest: CTAB, no wheezes, rales, or rhonchi  Abdominal: Soft. Non-tender, non-distended, bowel sounds are normal, no masses, organomegaly, or guarding present.  GU: no CVA tenderness Musculoskeletal: No joint deformities, erythema, or stiffness, ROM full and no  nontender Ext: +1 edema pitting with chronic lymphedema and no cyanosis, pulses palpable bilaterally (DP and PT)  Hematology: no cervical, inginal, or axillary adenopathy.  Neurological: A&O x3, Strenght is normal and symmetric bilaterally, cranial nerve II-XII are grossly intact, no focal motor deficit, sensory intact to light touch bilaterally.  Skin: Warm, excessive dryness with flaking skin noted of the bilateral lower extremities. No rash, cyanosis, or clubbing.  Psychiatric: Normal mood and affect. speech and behavior is normal. Judgment and thought content normal. Cognition and memory are normal.      Data Review   Micro Results No results found for this or any previous visit (from the past 240 hour(s)).  Radiology Reports Dg Pelvis 1-2 Views  05/20/2015  CLINICAL DATA:  Right groin pain after fall. EXAM: PELVIS - 1-2 VIEW COMPARISON:  None. FINDINGS: No fracture, diastasis or focal osseous lesion. No evidence of hip dislocation on this frontal view. Mild-to-moderate osteoarthritis in the weight-bearing portions of both hip joints. Vascular calcifications throughout the soft tissues. Degenerative changes in the visualized lower lumbar spine. IMPRESSION: No pelvic fracture. Electronically Signed   By: Delbert Phenix M.D.   On: 05/20/2015 18:39   Ct Head Wo Contrast  05/20/2015  ADDENDUM REPORT: 05/20/2015 16:02 ADDENDUM: These results were called by telephone at the time of interpretation on 05/20/2015 at 3:54 pm to Dr. Amada Jupiter, who verbally acknowledged these results. Electronically Signed   By: Delbert Phenix M.D.   On: 05/20/2015 16:02  05/20/2015  CLINICAL DATA:  Code stroke. Right lower extremity and right upper extremity weakness. Fall. EXAM: CT HEAD WITHOUT CONTRAST TECHNIQUE: Contiguous axial images were obtained from the base of the skull through the vertex without intravenous contrast. COMPARISON:  None. FINDINGS: There is vague focal hypodensity in the left corona radiata. There  is focal loss of gray-white differentiation in the medial right occipital lobe (series 2/ image 13). No evidence of parenchymal hemorrhage or extra-axial fluid collection. No mass lesion, mass effect, or midline shift.Intracranial atherosclerosis. Nonspecific mild subcortical and periventricular white matter hypodensity, most in keeping with chronic small vessel ischemic change. Cerebral volume is age appropriate. No ventriculomegaly. The visualized paranasal sinuses are essentially clear. The mastoid air cells are unopacified. No evidence of calvarial fracture. IMPRESSION: 1. Focal loss of gray-white differentiation in the medial right occipital lobe, suspicious for acute or subacute cortical infarct. Vague  focal hypodensity in the left corona radiata, which could represent acute or subacute ischemia. Correlate with brain MRI as clinically warranted. 2. No acute intracranial hemorrhage. No mass effect or midline shift. 3. Intracranial atherosclerosis and mild chronic small vessel ischemia. Electronically Signed: By: Delbert Phenix M.D. On: 05/20/2015 15:59   Mr Brain Wo Contrast  05/20/2015  CLINICAL DATA:  Increasing RIGHT lower extremity weakness. History of remote cerebral infarction. EXAM: MRI HEAD WITHOUT CONTRAST TECHNIQUE: Multiplanar, multiecho pulse sequences of the brain and surrounding structures were obtained without intravenous contrast. COMPARISON:  CT head 05/20/2015. FINDINGS: No evidence for acute infarction, hemorrhage, mass lesion, hydrocephalus, or extra-axial fluid. Premature for age cerebral and cerebellar atrophy. Chronic microvascular ischemic change affects the periventricular and subcortical white matter. There is a chronic area of infarction affecting the RIGHT occipital lobe, identified on CT, without acute or subacute features. Remote deep white matter infarction affects the LEFT centrum semiovale, also without acute or subacute features. Chronic lacunar infarctions affect the LEFT  paramedian pons. Flow voids are maintained throughout. There is a chronic hemorrhage with encephalomalacia affecting the lentiform nucleus and regional white matter on the LEFT, likely hypertensive related. No other similar foci of susceptibility. Marked empty sella, with flattening of the gland and expansion. No tonsillar herniation. Upper cervical region unremarkable. Sequelae of prior LEFT ocular surgery. No sinus or mastoid air fluid levels. Extracranial soft tissues unremarkable. IMPRESSION: No acute intracranial findings. No cause is seen for increasing RIGHT leg weakness. Foci of chronic hemorrhage and chronic infarctions scattered throughout the brain, greater on the LEFT. Marked empty sella.  Correlate clinically as a cause of headache. Electronically Signed   By: Elsie Stain M.D.   On: 05/20/2015 17:32     CBC  Recent Labs Lab 05/16/15 1052 05/20/15 1545 05/20/15 1555  WBC 6.7 5.9  --   HGB 10.7* 10.3* 11.2*  HCT 31.6* 31.2* 33.0*  PLT 187 171  --   MCV 88.0 87.9  --   MCH 29.8 29.0  --   MCHC 33.9 33.0  --   RDW 14.1 13.8  --   LYMPHSABS  --  1.3  --   MONOABS  --  0.5  --   EOSABS  --  0.0  --   BASOSABS  --  0.0  --     Chemistries   Recent Labs Lab 05/16/15 1052 05/20/15 1545 05/20/15 1555  NA 144 139 140  K 4.0 4.3 4.3  CL 108 101 101  CO2 28 26  --   GLUCOSE 116* 146* 141*  BUN 63* 48* 45*  CREATININE 1.29* 2.19* 2.10*  CALCIUM 9.7 9.5  --   AST 59* 43*  --   ALT 47 33  --   ALKPHOS 228* 196*  --   BILITOT 0.5 0.6  --    ------------------------------------------------------------------------------------------------------------------ estimated creatinine clearance is 51.2 mL/min (by C-G formula based on Cr of 2.1). ------------------------------------------------------------------------------------------------------------------ No results for input(s): HGBA1C in the last 72  hours. ------------------------------------------------------------------------------------------------------------------ No results for input(s): CHOL, HDL, LDLCALC, TRIG, CHOLHDL, LDLDIRECT in the last 72 hours. ------------------------------------------------------------------------------------------------------------------ No results for input(s): TSH, T4TOTAL, T3FREE, THYROIDAB in the last 72 hours.  Invalid input(s): FREET3 ------------------------------------------------------------------------------------------------------------------ No results for input(s): VITAMINB12, FOLATE, FERRITIN, TIBC, IRON, RETICCTPCT in the last 72 hours.  Coagulation profile  Recent Labs Lab 05/20/15 1545  INR 1.22    No results for input(s): DDIMER in the last 72 hours.  Cardiac Enzymes No results for input(s): CKMB, TROPONINI, MYOGLOBIN in  the last 168 hours.  Invalid input(s): CK ------------------------------------------------------------------------------------------------------------------ Invalid input(s): POCBNP   CBG:  Recent Labs Lab 05/20/15 1557  GLUCAP 120*       EKG: Independently reviewed. Sinus rhythm   Assessment/Plan Acute kidney Injury on chronic kidney disease stage III: Baseline Cr was around 1.26.  Cr is  2.1 and BUN is 45 on admission. Likely due to prerenal failure secondary to dehydration and  diruetics and/or NSAIDs use.  - Hold diuretics now - IVF as above - Check FeUrea - US-renal - Follow up renal function by BMP - Holding diuretics and ACE-I  Right lower extremity weakness - Physical therapy to evaluate and treat in a.m.  Anemia: Hemoglobin 11.2 g/dl . Patient currently denies dark stools -  Continue to monitor with elevated BUN  CHF -Continue carvedilol, hydralazine, Metolazone, isosorbide mononitrate,    Essential hypertension uncontrolled - Continue amlodipine and all other medications as stated above - Held Bumex and  lisinopril  Diabetes mellitus type II - CBGs every before meals and at bedtime with sliding scale insulin - Home regimen not restarted as patient reports decreased by mouth appetite recently  Lymphedema: Chronic. - Compression stockings  BPH: - Follow up renal ultrasound - Continue Flomax  OSA on CPAP - Cpap per respiratory therapy  Depression -Continue Celexa  Hyperlipidemia - Continue atorvastatin   Code Status:   full Family Communication: bedside Disposition Plan: admit   Total time spent 55 minutes.Greater than 50% of this time was spent in counseling, explanation of diagnosis, planning of further management, and coordination of care  Clydie Braun [facility] Pager [phone]  If 7PM-7AM, please contact night-coverage [email or web address] Password [facility] 05/20/2015, 8:19 PM

## 2015-05-21 NOTE — Care Management Note (Signed)
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 PTA independent with ADL's. Uses walker with ambulation. PCP:   Action/Plan: Return to home when medically stable. CM to f/u with disposition needs.  Expected Discharge Date:                  Expected Discharge Plan:  Home/Self Care ([facility])  In-House Referral:     Discharge planning Services  CM Consult  Post Acute Care Choice:    Choice offered to:     DME Arranged:   (OWNS Dan Humphreys) DME Agency:     HH Arranged:    HH Agency:     Status of Service:  In process, will continue to follow  Medicare Important Message Given:    Date Medicare IM Given:    Medicare IM give by:    Date Additional Medicare IM Given:    Additional Medicare Important Message give by:     If discussed at Long Length of Stay Meetings, dates discussed:    Additional Comments:  Epifanio Lesches, [place] [phone] 05/21/2015, 8:07 PM

## 2015-05-21 NOTE — Evaluation (Signed)
Physical Therapy Evaluation Patient Details Name: [name] MRN: [id number] DOB: [birth year] Today's Date: 05/21/2015   History of Present Illness  Pt adm with rt sided weakness. MRI negative. PMH - Lt CVA with resiidual rt sided weakness, DM, CHF, lymphadema  Clinical Impression  Pt doing well with mobility and no further PT needed.  Pt at baseline.     Follow Up Recommendations No PT follow up    Equipment Recommendations  None recommended by PT    Recommendations for Other Services       Precautions / Restrictions Precautions Precautions: Fall Restrictions Weight Bearing Restrictions: No      Mobility  Bed Mobility Overal bed mobility: Modified Independent             General bed mobility comments: Uses UE's to bring RLE back up into bed  Transfers Overall transfer level: Modified independent Equipment used: 4-wheeled walker                Ambulation/Gait Ambulation/Gait assistance: Supervision Ambulation Distance (Feet): 170 Feet Assistive device: 4-wheeled walker Gait Pattern/deviations: Step-through pattern;Decreased step length - right Gait velocity: decr Gait velocity interpretation: Below normal speed for age/gender General Gait Details: supervision for lines and safety  Stairs            Wheelchair Mobility    Modified Rankin (Stroke Patients Only) Modified Rankin (Stroke Patients Only) Pre-Morbid Rankin Score: Moderate disability Modified Rankin: Moderate disability     Balance Overall balance assessment: Needs assistance Sitting-balance support: No upper extremity supported;Feet supported Sitting balance-Leahy Scale: Normal     Standing balance support: No upper extremity supported Standing balance-Leahy Scale: Fair                               Pertinent Vitals/Pain Pain Assessment: No/denies pain    Home Living Family/patient expects to be discharged to:: Shelter/Homeless                       Prior Function Level of Independence: Independent with assistive device(s)         Comments: amb with rolling walker     Hand Dominance        Extremity/Trunk Assessment   Upper Extremity Assessment: RUE deficits/detail RUE Deficits / Details: Residual weakness from old CVA ~4/5         Lower Extremity Assessment: RLE deficits/detail RLE Deficits / Details: residual weakness from old CVA. Grossly 3/5       Communication   Communication: No difficulties  Cognition Arousal/Alertness: Awake/alert Behavior During Therapy: WFL for tasks assessed/performed Overall Cognitive Status: No family/caregiver present to determine baseline cognitive functioning Area of Impairment: Memory     Memory: Decreased short-term memory              General Comments      Exercises        Assessment/Plan    PT Assessment Patent does not need any further PT services  PT Diagnosis Abnormality of gait   PT Problem List    PT Treatment Interventions     PT Goals (Current goals can be found in the Care Plan section) Acute Rehab PT Goals PT Goal Formulation: All assessment and education complete, DC therapy    Frequency     Barriers to discharge        Co-evaluation               End  of Session Equipment Utilized During Treatment: Gait belt Activity Tolerance: Patient tolerated treatment well Patient left: in bed;with call bell/phone within reach;with bed alarm set Nurse Communication: Mobility status    Functional Assessment Tool Used: clinical judgement Functional Limitation: Mobility: Walking and moving around Mobility: Walking and Moving Around Current Status (E9937): At least 1 percent but less than 20 percent impaired, limited or restricted Mobility: Walking and Moving Around Goal Status [phone]): At least 1 percent but less than 20 percent impaired, limited or restricted Mobility: Walking and Moving Around Discharge Status [phone]): At least 1 percent  but less than 20 percent impaired, limited or restricted    Time: 1038-1059 PT Time Calculation (min) (ACUTE ONLY): 21 min   Charges:   PT Evaluation $PT Eval Low Complexity: 1 Procedure     PT G Codes:   PT G-Codes **NOT FOR INPATIENT CLASS** Functional Assessment Tool Used: clinical judgement Functional Limitation: Mobility: Walking and moving around Mobility: Walking and Moving Around Current Status (O1751): At least 1 percent but less than 20 percent impaired, limited or restricted Mobility: Walking and Moving Around Goal Status [phone]): At least 1 percent but less than 20 percent impaired, limited or restricted Mobility: Walking and Moving Around Discharge Status [phone]): At least 1 percent but less than 20 percent impaired, limited or restricted    [facility] 05/21/2015, 11:21 AM [facility] PT [phone]

## 2015-05-22 LAB — CBC WITH DIFFERENTIAL/PLATELET
BASOS PCT: 0 %
Basophils Absolute: 0 10*3/uL (ref 0.0–0.1)
EOS ABS: 0.1 10*3/uL (ref 0.0–0.7)
EOS PCT: 1 %
HCT: 31.2 % — ABNORMAL LOW (ref 39.0–52.0)
Hemoglobin: 10.2 g/dL — ABNORMAL LOW (ref 13.0–17.0)
LYMPHS ABS: 1.1 10*3/uL (ref 0.7–4.0)
Lymphocytes Relative: 23 %
MCH: 29 pg (ref 26.0–34.0)
MCHC: 32.7 g/dL (ref 30.0–36.0)
MCV: 88.6 fL (ref 78.0–100.0)
MONOS PCT: 6 %
Monocytes Absolute: 0.3 10*3/uL (ref 0.1–1.0)
NEUTROS PCT: 70 %
Neutro Abs: 3.4 10*3/uL (ref 1.7–7.7)
PLATELETS: 166 10*3/uL (ref 150–400)
RBC: 3.52 MIL/uL — ABNORMAL LOW (ref 4.22–5.81)
RDW: 13.8 % (ref 11.5–15.5)
WBC: 4.9 10*3/uL (ref 4.0–10.5)

## 2015-05-22 LAB — COMPREHENSIVE METABOLIC PANEL
ALT: 28 U/L (ref 17–63)
ANION GAP: 8 (ref 5–15)
AST: 39 U/L (ref 15–41)
Albumin: 2.9 g/dL — ABNORMAL LOW (ref 3.5–5.0)
Alkaline Phosphatase: 197 U/L — ABNORMAL HIGH (ref 38–126)
BUN: 32 mg/dL — ABNORMAL HIGH (ref 6–20)
CHLORIDE: 106 mmol/L (ref 101–111)
CO2: 27 mmol/L (ref 22–32)
Calcium: 9.5 mg/dL (ref 8.9–10.3)
Creatinine, Ser: 1.31 mg/dL — ABNORMAL HIGH (ref 0.61–1.24)
GFR calc non Af Amer: >60 mL/min (ref >60)
GLUCOSE: 107 mg/dL — AB (ref 65–99)
POTASSIUM: 4 mmol/L (ref 3.5–5.1)
SODIUM: 141 mmol/L (ref 135–145)
Total Bilirubin: 0.6 mg/dL (ref 0.3–1.2)
Total Protein: 6.5 g/dL (ref 6.5–8.1)

## 2015-05-22 LAB — GLUCOSE, CAPILLARY
GLUCOSE-CAPILLARY: 106 mg/dL — AB (ref 65–99)
GLUCOSE-CAPILLARY: 138 mg/dL — AB (ref 65–99)
Glucose-Capillary: 60 mg/dL — ABNORMAL LOW (ref 65–99)
Glucose-Capillary: 110 mg/dL — ABNORMAL HIGH (ref 65–99)

## 2015-05-22 LAB — UREA NITROGEN, URINE: Urea Nitrogen, Ur: 357 mg/dL

## 2015-05-22 MED ORDER — LISINOPRIL 5 MG PO TABS: 5.0000 mg | ORAL_TABLET | Freq: Every day | ORAL | Status: DC

## 2015-05-22 MED ORDER — IPRATROPIUM BROMIDE 0.02 % IN SOLN: 0.5000 mg | RESPIRATORY_TRACT | Status: AC | PRN

## 2015-05-22 MED ORDER — FUROSEMIDE 40 MG PO TABS: 40.0000 mg | ORAL_TABLET | ORAL | Status: DC

## 2015-05-22 NOTE — Progress Notes (Addendum)
CM made aware per MD pt can't return to [facility] @ discharge 2/2 to center stating they can't maintain pt's functional level. CM spoke with pt and pt confirmed information which was received via phone conversation with Stanton Kidney Stokes(director) @ the [facility] . CM called and left voice message with [facility], awaiting call back. Pt is a Cytogeneticist. Pt informed CM [facility] is [place] [phone] ext. [phone]. CM reached out to [facility] and voice message concerning pt's housing situation, CM awaiting call back . Gae Gallop RN,BSN,CM [phone]

## 2015-05-22 NOTE — Discharge Summary (Signed)
Physician Discharge Summary  [name] KHT:[id number] DOB: 07-15-1960 DOA: 05/20/2015  PCP: No primary care provider on file.  Admit date: 05/20/2015 Discharge date: 05/22/2015  Time spent: 35 minutes  Recommendations for Outpatient Follow-up:  1. Look at changes to [facility] 2. Needs Chem-7 1 week 3. Needs OP management [facility] status by [facility]-is euvolemic and stable currently  Discharge Diagnoses:  Principal Problem:   AKI (acute kidney injury) ([place]) Active Problems:   Weakness of right lower extremity   Essential hypertension   CHF (congestive heart failure) (HCC)   Anemia of chronic disease   DM (diabetes mellitus) type 2, uncontrolled, with ketoacidosis (HCC)   OSA ? compliacne   Homelessness   Weakness   Discharge Condition: stable  Diet recommendation: hh low salt  There were no vitals filed for this visit.  History of present illness:  55 y/o ? htn bph ckd III chf htn [facility]  Admitted with R L extremity weakness on 05/21/15--has chr deficit 2/2 to CVA in past Found to have AKI-noted to be on Bumex/Metolazone and ACE-1 inhibtor  Hospital Course:   AKI (acute kidney injury) ([place])  05/20/2015    baseline eGFR unknown-[facility] [place] [facility]ie[facility]  With dm/htn/BPH/obst uropathy--All potential causes for renal insufficiency UA + for proteinura-hold US kidney for now -Fractional Excretion 71fSodium (FENa) = Unreliable as patient is on Bumex as well as metolazone -Hold all diuretics including Aldactone 50 daily Cut back dose lisinopril 40-->5 mg daily On d/c added lasix 40 qod and optimized meds Need [facility] input as OP    Weakness of right lower extremity  Therapy to evaluate the patient-they felt no acute needs and s/o Social work/CM  aware that patient medically ready for d/c 05/22/15    Anemia of chronic disease-Normocytic borderline microcytic  Hemoglobin 11.2-9.6 Likely dilutional Colonoscopy outpatient Cont  Feso4    DM (diabetes mellitus) type 2, uncontrolled, with ketoacidosis (HCC)  Continue sliding scale insulin Continue Lantus 15 every afternoon.   OSA ? Compliance  -use  05/21/2015   [place]  Essential hypertension  -see above   [place]  -coreg 25 bid, hydralazine-100 tid Continue Imdur 30 daily, amlodipine 10 -Awaited records to see above discussion-appears to have heart failure of unknown etiology-no further clarification will be provided-thank you  Discharge Exam: Filed Vitals:   05/22/15 0554 05/22/15 1313  BP: 150/81 147/81  Pulse: 65 70  Temp: 98.3 F (36.8 C) 98.2 F (36.8 C)  Resp: 18 18    General: eomi, ncat Cardiovascular: s1 s 2no m/r/g Respiratory: clear Chr R sided deficits  Discharge Instructions   Discharge Instructions    Diet - low sodium heart healthy    Complete by:  As directed      Discharge instructions    Complete by:  As directed   [facility] by [facility]     Increase activity slowly    Complete by:  As directed           Current Discharge Medication List    START taking these medications   Details  furosemide (LASIX) 40 MG tablet Take 1 tablet (40 mg total) by mouth every other day. Qty: 30 tablet    ipratropium (ATROVENT) 0.02 % nebulizer solution Take 2.5 mLs (0.5 mg total) by nebulization every  2 (two) hours as needed for wheezing or shortness of breath. Qty: 75 mL, Refills: 0      CONTINUE these medications which have CHANGED   Details  lisinopril (PRINIVIL,ZESTRIL) 5 MG tablet Take 1 tablet (5 mg total) by mouth daily. Qty: 30 tablet, Refills: 0      CONTINUE these medications which have NOT CHANGED   Details  acetaminophen (TYLENOL) 325 MG tablet Take 650 mg by mouth 3 (three) times daily as needed for mild pain, moderate pain or headache.      amLODipine (NORVASC) 10 MG tablet Take 10 mg by mouth daily.    aspirin EC 81 MG tablet Take 81 mg by mouth daily.    atorvastatin (LIPITOR) 40 MG tablet Take 20 mg by mouth at bedtime.     carvedilol (COREG) 25 MG tablet Take 50 mg by mouth daily.     citalopram (CELEXA) 40 MG tablet Take 40 mg by mouth daily.    doxazosin (CARDURA) 8 MG tablet Take 4 mg by mouth at bedtime.     ferrous sulfate 325 (65 FE) MG tablet Take 325 mg by mouth daily with breakfast.    hydrALAZINE (APRESOLINE) 100 MG tablet Take 100 mg by mouth 3 (three) times daily.    insulin glargine (LANTUS) 100 UNIT/ML injection Inject 15 Units into the skin at bedtime.     isosorbide mononitrate (IMDUR) 30 MG 24 hr tablet Take 30 mg by mouth daily.    Multiple Vitamin (MULTIVITAMIN WITH MINERALS) TABS tablet Take 1 tablet by mouth daily.    ondansetron (ZOFRAN ODT) 8 MG disintegrating tablet Take 1 tablet (8 mg total) by mouth every 8 (eight) hours as needed for nausea or vomiting. Qty: 12 tablet, Refills: 0    oxybutynin (DITROPAN) 5 MG tablet Take 5 mg by mouth 3 (three) times daily.    pantoprazole (PROTONIX) 40 MG tablet Take 40 mg by mouth daily. Take on an empty stomach 30 minutes before a meal    potassium chloride SA (K-DUR,KLOR-CON) 20 MEQ tablet Take 20 mEq by mouth daily.    silver sulfADIAZINE (SILVADENE) 1 % cream Apply 1 application topically daily as needed (skin).    spironolactone (ALDACTONE) 25 MG tablet Take 50 mg by mouth daily.     sulfaSALAzine (AZULFIDINE) 500 MG tablet Take 2,000 mg by mouth 2 (two) times daily.     tamsulosin (FLOMAX) 0.4 MG CAPS capsule Take 0.4 mg by mouth at bedtime.     polyethylene glycol (MIRALAX / GLYCOLAX) packet Take 17 g by mouth daily as needed for mild constipation or moderate constipation.      STOP taking these medications     bumetanide (BUMEX) 2 MG tablet      Calcium Carbonate-Vitamin D (CALCIUM-VITAMIN D) 500-200 MG-UNIT tablet      insulin  aspart (NOVOLOG FLEXPEN) 100 UNIT/ML FlexPen      metolazone (ZAROXOLYN) 5 MG tablet        Allergies  Allergen Reactions  . Chicken Allergy Other (See Comments)    Sneezing       The results of significant diagnostics from this hospitalization (including imaging, microbiology, ancillary and laboratory) are listed below for reference.    Significant Diagnostic Studies: Dg Pelvis 1-2 Views  05/20/2015  CLINICAL DATA:  Right groin pain after fall. EXAM: PELVIS - 1-2 VIEW COMPARISON:  None. FINDINGS: No fracture, diastasis or focal osseous lesion. No evidence of hip dislocation on this frontal view. Mild-to-moderate osteoarthritis in the weight-bearing portions of  both hip joints. Vascular calcifications throughout the soft tissues. Degenerative changes in the visualized lower lumbar spine. IMPRESSION: No pelvic fracture. Electronically Signed   By: Ilona Sorrel M.D.   On: 05/20/2015 18:39   Ct Head Wo Contrast  05/20/2015  ADDENDUM REPORT: 05/20/2015 16:02 ADDENDUM: These results were called by telephone at the time of interpretation on 05/20/2015 at 3:54 pm to Dr. Leonel Ramsay, who verbally acknowledged these results. Electronically Signed   By: Ilona Sorrel M.D.   On: 05/20/2015 16:02  05/20/2015  CLINICAL DATA:  Code stroke. Right lower extremity and right upper extremity weakness. Fall. EXAM: CT HEAD WITHOUT CONTRAST TECHNIQUE: Contiguous axial images were obtained from the base of the skull through the vertex without intravenous contrast. COMPARISON:  None. FINDINGS: There is vague focal hypodensity in the left corona radiata. There is focal loss of gray-white differentiation in the medial right occipital lobe (series 2/ image 13). No evidence of parenchymal hemorrhage or extra-axial fluid collection. No mass lesion, mass effect, or midline shift.Intracranial atherosclerosis. Nonspecific mild subcortical and periventricular white matter hypodensity, most in keeping with chronic small vessel  ischemic change. Cerebral volume is age appropriate. No ventriculomegaly. The visualized paranasal sinuses are essentially clear. The mastoid air cells are unopacified. No evidence of calvarial fracture. IMPRESSION: 1. Focal loss of gray-white differentiation in the medial right occipital lobe, suspicious for acute or subacute cortical infarct. Vague focal hypodensity in the left corona radiata, which could represent acute or subacute ischemia. Correlate with brain MRI as clinically warranted. 2. No acute intracranial hemorrhage. No mass effect or midline shift. 3. Intracranial atherosclerosis and mild chronic small vessel ischemia. Electronically Signed: By: Ilona Sorrel M.D. On: 05/20/2015 15:59   Mr Brain Wo Contrast  05/20/2015  CLINICAL DATA:  Increasing RIGHT lower extremity weakness. History of remote cerebral infarction. EXAM: MRI HEAD WITHOUT CONTRAST TECHNIQUE: Multiplanar, multiecho pulse sequences of the brain and surrounding structures were obtained without intravenous contrast. COMPARISON:  CT head 05/20/2015. FINDINGS: No evidence for acute infarction, hemorrhage, mass lesion, hydrocephalus, or extra-axial fluid. Premature for age cerebral and cerebellar atrophy. Chronic microvascular ischemic change affects the periventricular and subcortical white matter. There is a chronic area of infarction affecting the RIGHT occipital lobe, identified on CT, without acute or subacute features. Remote deep white matter infarction affects the LEFT centrum semiovale, also without acute or subacute features. Chronic lacunar infarctions affect the LEFT paramedian pons. Flow voids are maintained throughout. There is a chronic hemorrhage with encephalomalacia affecting the lentiform nucleus and regional white matter on the LEFT, likely hypertensive related. No other similar foci of susceptibility. Marked empty sella, with flattening of the gland and expansion. No tonsillar herniation. Upper cervical region  unremarkable. Sequelae of prior LEFT ocular surgery. No sinus or mastoid air fluid levels. Extracranial soft tissues unremarkable. IMPRESSION: No acute intracranial findings. No cause is seen for increasing RIGHT leg weakness. Foci of chronic hemorrhage and chronic infarctions scattered throughout the brain, greater on the LEFT. Marked empty sella.  Correlate clinically as a cause of headache. Electronically Signed   By: Staci Righter M.D.   On: 05/20/2015 17:32   US Renal  05/21/2015  CLINICAL DATA:  Acute renal failure. History of hypertension and diabetes. EXAM: RENAL / URINARY TRACT ULTRASOUND COMPLETE COMPARISON:  None. FINDINGS: Right Kidney: Length: 12.8 cm. Slightly increased renal parenchymal echotexture without parenchymal thinning. This may be associated with medical renal disease. No hydronephrosis or mass lesion. Left Kidney: Length: 11.8 cm. Echogenicity within normal limits. No  mass or hydronephrosis visualized. Bladder: Appears normal for degree of bladder distention. IMPRESSION: Slight increase renal parenchymal echotexture on the right may be associated with medical renal disease. Kidneys and bladder are otherwise normal. No hydronephrosis. Electronically Signed   By: Lucienne Capers M.D.   On: 05/21/2015 02:26    Microbiology: Recent Results (from the past 240 hour(s))  MRSA PCR Screening     Status: None   Collection Time: 05/20/15 11:00 PM  Result Value Ref Range Status   MRSA by PCR NEGATIVE NEGATIVE Final    Comment:        The GeneXpert MRSA Assay (FDA approved for NASAL specimens only), is one component of a comprehensive MRSA colonization surveillance program. It is not intended to diagnose MRSA infection nor to guide or monitor treatment for MRSA infections.      Labs: Basic Metabolic Panel:  Recent Labs Lab 05/16/15 1052 05/20/15 1545 05/20/15 1555 05/21/15 0547 05/22/15 1311  NA 144 139 140 142 141  K 4.0 4.3 4.3 3.8 4.0  CL 108 101 101 106 106  CO2  28 26  --  28 27  GLUCOSE 116* 146* 141* 81 107*  BUN 63* 48* 45* 41* 32*  CREATININE 1.29* 2.19* 2.10* 1.64* 1.31*  CALCIUM 9.7 9.5  --  9.1 9.5   Liver Function Tests:  Recent Labs Lab 05/16/15 1052 05/20/15 1545 05/22/15 1311  AST 59* 43* 39  ALT 47 33 28  ALKPHOS 228* 196* 197*  BILITOT 0.5 0.6 0.6  PROT 7.1 6.8 6.5  ALBUMIN 3.3* 3.1* 2.9*   No results for input(s): LIPASE, AMYLASE in the last 168 hours. No results for input(s): AMMONIA in the last 168 hours. CBC:  Recent Labs Lab 05/16/15 1052 05/20/15 1545 05/20/15 1555 05/21/15 0547 05/22/15 1311  WBC 6.7 5.9  --  5.1 4.9  NEUTROABS  --  4.1  --   --  3.4  HGB 10.7* 10.3* 11.2* 9.6* 10.2*  HCT 31.6* 31.2* 33.0* 29.7* 31.2*  MCV 88.0 87.9  --  87.9 88.6  PLT 187 171  --  158 166   Cardiac Enzymes: No results for input(s): CKTOTAL, CKMB, CKMBINDEX, TROPONINI in the last 168 hours. BNP: BNP (last 3 results) No results for input(s): BNP in the last 8760 hours.  ProBNP (last 3 results) No results for input(s): PROBNP in the last 8760 hours.  CBG:  Recent Labs Lab 05/21/15 1226 05/21/15 1649 05/21/15 2101 05/22/15 0735 05/22/15 1113  GLUCAP 98 116* 133* 60* 110*       Signed:  Nita Sells MD   [facility] 05/22/2015, 3:33 PM

## 2015-05-22 NOTE — Progress Notes (Addendum)
CM received call from Nurse Alexia Freestone and Consuelo Pandy [phone]) @ the [facility]. Nurse Tobi Bastos stated became a client at the [facility] on 05/12/2015 and since arrival he has had 3 tours to the  hospital, has been lucid but answers questions appropriately and needs help with medication. Nurse stated the [facility] is an independent care facility. CM expressed pt current condition( A&OX4, answers questions appropriately, taking medication without difficulty and per PT's eval./recommendation, no needs identified) to the nurse and SW @ the [facility]. Nurse Tobi Bastos and Gabriel Rung SW to discuss case with  director @ the [facility] and follow up with CM in am with an answer whether or not pt can return to the [facility].  Gae Gallop RN,BSN,CM [phone]

## 2015-05-22 NOTE — Progress Notes (Signed)
Pharmacist Provided - Patient Medication Education Prior to Discharge   [name] is an 55 y.o. male who presented to [facility] on 05/20/2015 with a chief complaint of RLE weakness/AKI Chief Complaint  Patient presents with  . Extremity Weakness     [x]  Patient will be discharged with 2 new medications []  Patient being discharged without any new medications  The following medications were discussed with the patient: diuretics, ACEi   Pain Control medications: []  Yes    [x]  No  Diabetes Medications: [x]  Yes    [x]  No  Heart Failure Medications: [x]  Yes    []  No  Anticoagulation Medications:  []  Yes    [x]  No  Antibiotics at discharge: []  Yes    [x]  No  Allergy Assessment Completed and Updated: [x]  Yes    []  No Identified Patient Allergies:  Allergies  Allergen Reactions  . Chicken Allergy Other (See Comments)    Sneezing      Medication Adherence Assessment: []  Excellent (no doses missed/week)      []  Good (1 dose missed/week)      [x]  Partial (2-3 doses missed/week)      []  Poor (>3 doses missed/week)   Assessment: Reviewed changes to diuretics and heart failure medications at discharge. Discussed general pharmacology such as potential adverse effects, DDIs, and proper dosing. All pt medication questions addressed.   Time spent preparing for discharge counseling: 10 min Time spent counseling patient: 10 min  Sherle Poe, PharmD Clinical Pharmacy Resident 4:54 PM, 05/22/2015

## 2015-05-23 LAB — GLUCOSE, CAPILLARY
GLUCOSE-CAPILLARY: 78 mg/dL (ref 65–99)
Glucose-Capillary: 82 mg/dL (ref 65–99)

## 2015-05-23 NOTE — Progress Notes (Addendum)
CM received call from [facility] SW(liaison between the [place] and the [facility]) and was informed pt can return back to the [facility] this am. NCM made pt aware.  Gae Gallop RN,BSN,CM [phone]

## 2015-05-23 NOTE — Progress Notes (Signed)
Discharge instructions given. Pt verbalized understanding and all questions were answered.  

## 2015-05-23 NOTE — Care Management Important Message (Signed)
Important Message  Patient Details  Name: [name] MRN: [id number] Date of Birth: 21-Apr-1960   Medicare Important Message Given:  Yes    Gisel Vipond Abena 05/23/2015, 10:01 AM

## 2015-05-30 ENCOUNTER — Emergency Department (HOSPITAL_COMMUNITY)
Admission: EM | Admit: 2015-05-30 | Discharge: 2015-05-30 | Disposition: A | Discharge status: Home / Self Care | Payer: Medicare Other | Source: Home / Self Care | Attending: Emergency Medicine | Admitting: Emergency Medicine

## 2015-05-30 ENCOUNTER — Emergency Department (HOSPITAL_COMMUNITY)
Admission: EM | Admit: 2015-05-30 | Discharge: 2015-05-30 | Disposition: A | Discharge status: Home / Self Care | Payer: Medicare Other | Attending: Emergency Medicine | Admitting: Emergency Medicine

## 2015-05-30 ENCOUNTER — Encounter (HOSPITAL_COMMUNITY): Payer: Self-pay

## 2015-05-30 ENCOUNTER — Encounter (HOSPITAL_COMMUNITY): Payer: Self-pay | Admitting: Emergency Medicine

## 2015-05-30 DIAGNOSIS — E11319 Type 2 diabetes mellitus with unspecified diabetic retinopathy without macular edema: Secondary | ICD-10-CM

## 2015-05-30 DIAGNOSIS — Z7982 Long term (current) use of aspirin: Secondary | ICD-10-CM | POA: Insufficient documentation

## 2015-05-30 DIAGNOSIS — I509 Heart failure, unspecified: Secondary | ICD-10-CM | POA: Insufficient documentation

## 2015-05-30 DIAGNOSIS — Z794 Long term (current) use of insulin: Secondary | ICD-10-CM | POA: Insufficient documentation

## 2015-05-30 DIAGNOSIS — R112 Nausea with vomiting, unspecified: Secondary | ICD-10-CM | POA: Insufficient documentation

## 2015-05-30 DIAGNOSIS — R197 Diarrhea, unspecified: Secondary | ICD-10-CM

## 2015-05-30 DIAGNOSIS — Z79899 Other long term (current) drug therapy: Secondary | ICD-10-CM

## 2015-05-30 DIAGNOSIS — Z8673 Personal history of transient ischemic attack (TIA), and cerebral infarction without residual deficits: Secondary | ICD-10-CM | POA: Insufficient documentation

## 2015-05-30 DIAGNOSIS — R600 Localized edema: Secondary | ICD-10-CM | POA: Diagnosis not present

## 2015-05-30 DIAGNOSIS — Z5900 Homelessness unspecified: Secondary | ICD-10-CM

## 2015-05-30 DIAGNOSIS — I10 Essential (primary) hypertension: Secondary | ICD-10-CM | POA: Insufficient documentation

## 2015-05-30 DIAGNOSIS — N400 Enlarged prostate without lower urinary tract symptoms: Secondary | ICD-10-CM | POA: Insufficient documentation

## 2015-05-30 DIAGNOSIS — Z8669 Personal history of other diseases of the nervous system and sense organs: Secondary | ICD-10-CM | POA: Diagnosis not present

## 2015-05-30 DIAGNOSIS — Z9889 Other specified postprocedural states: Secondary | ICD-10-CM | POA: Insufficient documentation

## 2015-05-30 LAB — COMPREHENSIVE METABOLIC PANEL
ALT: 23 U/L (ref 17–63)
AST: 32 U/L (ref 15–41)
Albumin: 3.4 g/dL — ABNORMAL LOW (ref 3.5–5.0)
Alkaline Phosphatase: 181 U/L — ABNORMAL HIGH (ref 38–126)
Anion gap: 7 (ref 5–15)
BILIRUBIN TOTAL: 0.6 mg/dL (ref 0.3–1.2)
BUN: 35 mg/dL — AB (ref 6–20)
CALCIUM: 9.6 mg/dL (ref 8.9–10.3)
CO2: 22 mmol/L (ref 22–32)
CREATININE: 1.44 mg/dL — AB (ref 0.61–1.24)
Chloride: 110 mmol/L (ref 101–111)
GFR, EST NON AFRICAN AMERICAN: 54 mL/min — AB (ref >60)
Glucose, Bld: 93 mg/dL (ref 65–99)
Potassium: 4.4 mmol/L (ref 3.5–5.1)
Sodium: 139 mmol/L (ref 135–145)
Total Protein: 6.8 g/dL (ref 6.5–8.1)

## 2015-05-30 LAB — LIPASE, BLOOD: LIPASE: 16 U/L (ref 11–51)

## 2015-05-30 LAB — CBC
HCT: 31.3 % — ABNORMAL LOW (ref 39.0–52.0)
Hemoglobin: 10.6 g/dL — ABNORMAL LOW (ref 13.0–17.0)
MCH: 29.9 pg (ref 26.0–34.0)
MCHC: 33.9 g/dL (ref 30.0–36.0)
MCV: 88.4 fL (ref 78.0–100.0)
PLATELETS: 181 10*3/uL (ref 150–400)
RBC: 3.54 MIL/uL — AB (ref 4.22–5.81)
RDW: 14.5 % (ref 11.5–15.5)
WBC: 6 10*3/uL (ref 4.0–10.5)

## 2015-05-30 MED ORDER — LOPERAMIDE HCL 2 MG PO CAPS: 2.0000 mg | ORAL_CAPSULE | Freq: Four times a day (QID) | ORAL | Status: DC | PRN

## 2015-05-30 MED ORDER — ONDANSETRON 4 MG PO TBDP: 4.0000 mg | ORAL_TABLET | Freq: Three times a day (TID) | ORAL | Status: AC | PRN

## 2015-05-30 MED ORDER — SODIUM CHLORIDE 0.9 % IV BOLUS (SEPSIS)
1000.0000 mL | Freq: Once | INTRAVENOUS | Status: AC
  Administered 2015-05-30: 1000 mL via INTRAVENOUS

## 2015-05-30 MED ORDER — SODIUM CHLORIDE 0.9 % IV BOLUS (SEPSIS)
500.0000 mL | Freq: Once | INTRAVENOUS | Status: AC
  Administered 2015-05-30: 500 mL via INTRAVENOUS

## 2015-05-30 NOTE — Progress Notes (Signed)
Patient listed as having Medicare insurance without a pcp.  EDCM spoke to patient at bedside.  Patient reports his pcp is located at the [facility] in [place].  Patient reports he receives his medications through the [place] and are mailed to him.  He reports he has an upcoming appointment with his pcp.  No further EDCM needs at this time.

## 2015-05-30 NOTE — ED Notes (Signed)
Per EMS- Patient reports N/V/D x 5 days. Patient reported that he was seen at [facility] for the same 2 weeks ago, then it subsided.

## 2015-05-30 NOTE — Discharge Instructions (Signed)
FOLLOW UP WITH WITH THE RESOURCES PROVIDED FOR FURTHER ASSISTANCE WITH HOUSING.

## 2015-05-30 NOTE — ED Notes (Signed)
Bed: WTR7 Expected date:  Expected time:  Means of arrival:  Comments: Blood draw 

## 2015-05-30 NOTE — ED Notes (Signed)
Patient was alert, oriented and stable upon discharge. RN went over AVS and patient had no further questions.  

## 2015-05-30 NOTE — Discharge Instructions (Signed)
Take your medications as prescribed this and for nausea, vomiting and diarrhea. I recommend drinking at least six 8 glasses of water daily to remain hydrated at home. I also recommend eating a bland diet for the next few days until your symptoms have improved. Follow-up with your primary care provider in 3-4 days. Please return to the Emergency Department if symptoms worsen or new onset of fever, headache, cough, difficulty breathing, chest pain, unable to tolerate fluids due to vomiting, abdominal pain, blood in vomit or stool.

## 2015-05-30 NOTE — ED Provider Notes (Signed)
CSN: [id number]     Arrival date & time 05/30/15  1047 History   First MD Initiated Contact with Patient 05/30/15 1501     Chief Complaint  Patient presents with  . Emesis  . Diarrhea     (Consider location/radiation/quality/duration/timing/severity/associated sxs/prior Treatment) HPI   [name] past medical history of BPH, CHF, hypertension, type 2 diabetes, OSA, TIA A, chronic anemia who presents to the ED via EMS with complaint of nausea, vomiting and diarrhea, onset 5 days. Patient reports having approximately 2 episodes per day of NBNB vomiting and approximately 5 episodes per day of nonbloody diarrhea. Denies any aggravating or relieving factors. Denies any recent travel, camping, drinking from fresh water sources or any recent sick contacts. Denies fever, chills, body ache, cough, shortness of breath, chest pain, abdominal pain, urinary symptoms. Patient denies taking any medications at home. He notes he currently lives in a homeless shelter. Patient notes he was admitted on 05/20/15 for 3 days for dehydration and reports feeling good prior to discharge. Patient denies history of abdominal surgeries.   Past Medical History  Diagnosis Date  . BPH (benign prostatic hyperplasia)   . CHF (congestive heart failure) ([place])   . Hypertension   . Diabetes mellitus without complication ([place])   . Diabetic retinopathy associated with type 2 diabetes mellitus ([place])   . OSA (obstructive sleep apnea)   . Lymphedema   . TIA (transient ischemic attack)    Past Surgical History  Procedure Laterality Date  . Bladder surgery    . Cardiac catheterization     Family History  Problem Relation Age of Onset  . Cancer Mother   . Cancer Father    Social History  Substance Use Topics  . Smoking status: Never Smoker   . Smokeless tobacco: Never Used  . Alcohol Use: No    Review of Systems  Gastrointestinal: Positive for nausea, vomiting and diarrhea.  All other systems reviewed and  are negative.     Allergies  Chicken allergy  Home Medications   Prior to Admission medications   Medication Sig Start Date End Date Taking? Authorizing Provider  acetaminophen (TYLENOL) 325 MG tablet Take 650 mg by mouth 3 (three) times daily as needed for mild pain, moderate pain or headache.     Historical Provider, MD  amLODipine (NORVASC) 10 MG tablet Take 10 mg by mouth daily.    Historical Provider, MD  aspirin EC 81 MG tablet Take 81 mg by mouth daily.    Historical Provider, MD  atorvastatin (LIPITOR) 40 MG tablet Take 20 mg by mouth at bedtime.     Historical Provider, MD  carvedilol (COREG) 25 MG tablet Take 50 mg by mouth daily.     Historical Provider, MD  citalopram (CELEXA) 40 MG tablet Take 40 mg by mouth daily.    Historical Provider, MD  doxazosin (CARDURA) 8 MG tablet Take 4 mg by mouth at bedtime.     Historical Provider, MD  ferrous sulfate 325 (65 FE) MG tablet Take 325 mg by mouth daily with breakfast.    Historical Provider, MD  furosemide (LASIX) 40 MG tablet Take 1 tablet (40 mg total) by mouth every other day. 05/22/15   Nita Sells, MD  hydrALAZINE (APRESOLINE) 100 MG tablet Take 100 mg by mouth 3 (three) times daily.    Historical Provider, MD  insulin glargine (LANTUS) 100 UNIT/ML injection Inject 15 Units into the skin at bedtime.     Historical Provider, MD  ipratropium (  ATROVENT) 0.02 % nebulizer solution Take 2.5 mLs (0.5 mg total) by nebulization every 2 (two) hours as needed for wheezing or shortness of breath. 05/22/15   Nita Sells, MD  isosorbide mononitrate (IMDUR) 30 MG 24 hr tablet Take 30 mg by mouth daily.    Historical Provider, MD  lisinopril (PRINIVIL,ZESTRIL) 5 MG tablet Take 1 tablet (5 mg total) by mouth daily. 05/22/15   Nita Sells, MD  Multiple Vitamin (MULTIVITAMIN WITH MINERALS) TABS tablet Take 1 tablet by mouth daily.    Historical Provider, MD  ondansetron (ZOFRAN ODT) 8 MG disintegrating tablet Take 1 tablet (8  mg total) by mouth every 8 (eight) hours as needed for nausea or vomiting. Patient taking differently: Take 4 mg by mouth 3 (three) times daily as needed for nausea or vomiting.  05/16/15   Jola Schmidt, MD  oxybutynin (DITROPAN) 5 MG tablet Take 5 mg by mouth 3 (three) times daily.    Historical Provider, MD  pantoprazole (PROTONIX) 40 MG tablet Take 40 mg by mouth daily. Take on an empty stomach 30 minutes before a meal    Historical Provider, MD  polyethylene glycol (MIRALAX / GLYCOLAX) packet Take 17 g by mouth daily as needed for mild constipation or moderate constipation.    Historical Provider, MD  potassium chloride SA (K-DUR,KLOR-CON) 20 MEQ tablet Take 20 mEq by mouth daily.    Historical Provider, MD  silver sulfADIAZINE (SILVADENE) 1 % cream Apply 1 application topically daily as needed (skin).    Historical Provider, MD  spironolactone (ALDACTONE) 25 MG tablet Take 50 mg by mouth daily.     Historical Provider, MD  sulfaSALAzine (AZULFIDINE) 500 MG tablet Take 2,000 mg by mouth 2 (two) times daily.     Historical Provider, MD  tamsulosin (FLOMAX) 0.4 MG CAPS capsule Take 0.4 mg by mouth at bedtime.     Historical Provider, MD   BP 186/107 mmHg  Pulse 90  Temp(Src) 98 F (36.7 C) (Oral)  Resp 16  Ht '5\' 11"'$  (1.803 m)  Wt 97.07 kg  BMI 29.86 kg/m2  SpO2 96% Physical Exam  Constitutional: He is oriented to person, place, and time. He appears well-developed and well-nourished. No distress.  HENT:  Head: Normocephalic and atraumatic.  Nose: Nose normal. Right sinus exhibits no maxillary sinus tenderness and no frontal sinus tenderness. Left sinus exhibits no maxillary sinus tenderness and no frontal sinus tenderness.  Mouth/Throat: Uvula is midline, oropharynx is clear and moist and mucous membranes are normal. No oropharyngeal exudate, posterior oropharyngeal edema, posterior oropharyngeal erythema or tonsillar abscesses.  Eyes: Conjunctivae and EOM are normal. Right eye exhibits no  discharge. Left eye exhibits no discharge. No scleral icterus.  Neck: Normal range of motion. Neck supple.  Cardiovascular: Normal rate, regular rhythm, normal heart sounds and intact distal pulses.   Pulmonary/Chest: Effort normal and breath sounds normal. No respiratory distress. He has no wheezes. He has no rales. He exhibits no tenderness.  Abdominal: Soft. Bowel sounds are normal. He exhibits no distension and no mass. There is no tenderness. There is no rebound and no guarding.  Musculoskeletal: Normal range of motion.  1+ pitting edema noted to BLE which pt reports has improved since his admission on 05/20/15.  Lymphadenopathy:    He has no cervical adenopathy.  Neurological: He is alert and oriented to person, place, and time.  Skin: Skin is warm and dry. He is not diaphoretic.  Nursing note and vitals reviewed.   ED Course  Procedures (including  critical care time) Labs Review Labs Reviewed  COMPREHENSIVE METABOLIC PANEL - Abnormal; Notable for the following:    BUN 35 (*)    Creatinine, Ser 1.44 (*)    Albumin 3.4 (*)    Alkaline Phosphatase 181 (*)    GFR calc non Af Amer 54 (*)    All other components within normal limits  CBC - Abnormal; Notable for the following:    RBC 3.54 (*)    Hemoglobin 10.6 (*)    HCT 31.3 (*)    All other components within normal limits  LIPASE, BLOOD  URINALYSIS, ROUTINE W REFLEX MICROSCOPIC (NOT AT [facility])    Imaging Review No results found. I have personally reviewed and evaluated these images and lab results as part of my medical decision-making.   EKG Interpretation None      MDM   Final diagnoses:  None   Patient presents with nausea, vomiting and diarrhea that started 5 days ago. Denies fever or abdominal pain. VSS. Exam revealed 1+ pitting edema to bilateral lower extremities which patient reports has significantly improved since being discharged from the hospital on 05/22/15. Abdominal exam benign. Patient denies having any  nausea or vomiting since arrival to the ED. Patient given IV fluids. Labs showed BUN 35, Cr 1.44, alk phos 181, Hgb 10.6.   Chart review through care everywhere revealed pt with baseline Cr 1.2-1.5, BUN 20-30, Hgb 9-10. Pt also appears to have elevated alk phos at baseline. I suspect pt's BUN is likely mildly elevated due to mild dehydration.  On reevaluation pt is resting in bed comfortably. Patient reports his nausea, vomiting and diarrhea have resolved since arrival to the ED and denies any pain or complaints at this time. Denies abdominal pain. Abdominal exam benign. Patient able tolerate by mouth in the ED and has has 1.5L of IVF. I suspect patient's symptoms are likely due to viral gastroenteritis and I do not feel that any further workup or imaging is warranted at this time. No concern for appendicitis, diverticulitis or other acute abdominal problem that would warrant further imaging/management. Discussed results with pt and advised him to follow up with his PCP in 3-4 days. Pt given rx for zofran and imodium.    Chesley Noon [place], [place] 05/30/15 [place], MD 05/30/15 [phone]

## 2015-05-30 NOTE — ED Notes (Signed)
PT is aware of urine sample 

## 2015-05-30 NOTE — ED Notes (Signed)
Pt states that he was kicked out of the center he was staying because they are not able to take care of him. He needs a Child psychotherapist consult and that he was not supposed to be discharged earlier

## 2015-05-30 NOTE — Progress Notes (Signed)
CSW was notified by Nurse that pt is homeless.  CSW met with pt at bedside. Patient confirms that he is homeless. Patient states that he was living at [facility]. Pt states that he was kicked out without warning and cannot return.  CSW offered to find pt shelter at [facility]. Patient accepted. Shelter statff states pt can come tonight, but he has to sleep in the lobby and that he would have to leave after breakfast. CSW made pt aware. Patient was not happy about having to leave after breakfast. CSW provided pt with resources for shelters and food pantries.  Willette Brace [phone] ED CSW 05/30/2015 10:58 PM

## 2015-05-31 DIAGNOSIS — Z139 Encounter for screening, unspecified: Secondary | ICD-10-CM

## 2015-05-31 NOTE — Congregational Nurse Program (Signed)
Congregational Nurse Program Note  Date of Encounter: 05/31/2015  Past Medical History: Past Medical History  Diagnosis Date  . BPH (benign prostatic hyperplasia)   . CHF (congestive heart failure) ([facility])   . Hypertension   . Diabetes mellitus without complication ([facility])   . Diabetic retinopathy associated with type 2 diabetes mellitus ([facility])   . OSA (obstructive sleep apnea)   . Lymphedema   . TIA (transient ischemic attack)     Encounter Details:     CNP Questionnaire - 05/31/15 1413    Patient Demographics   Is this a new or existing patient? New   Patient is considered a/an Not Applicable   Race African-American/Black   Patient Assistance   Location of Patient Assistance Not Applicable   Patient's financial/insurance status Medicaid   Uninsured Patient No   Patient referred to apply for the following financial assistance Not Applicable   Food insecurities addressed Provided food supplies   Transportation assistance No   Assistance securing medications No   Educational health offerings Hypertension;Medications;Chronic disease;Navigating the healthcare system   Encounter Details   Primary purpose of visit Education/Health Concerns;Chronic Illness/Condition Visit;Navigating the Healthcare System   Was an Emergency Department visit averted? Not Applicable   Does patient have a medical provider? Yes   Patient referred to Not Applicable   Was a mental health screening completed? (GAINS tool) No   Does patient have dental issues? No   Does patient have vision issues? No   Since previous encounter, have you referred patient for abnormal blood pressure that resulted in a new diagnosis or medication change? No   Since previous encounter, have you referred patient for abnormal blood glucose that resulted in a new diagnosis or medication change? No   For Abstraction Use Only   Does patient have insurance? Yes       B/P check  160/100.  States that is what his B/P usually runs  in the AM.  Is currently staying in the lobby of the [facility] shelter due to no beds.  Mobility impaired as he has difficulty ambulating with a cane.  Has difficulty standing up from a chair and transfers.

## 2015-06-01 NOTE — ED Provider Notes (Signed)
CSN: [id number]     Arrival date & time 05/30/15  2057 History   First MD Initiated Contact with Patient 05/30/15 2311     Chief Complaint  Patient presents with  . Homeless     (Consider location/radiation/quality/duration/timing/severity/associated sxs/prior Treatment) HPI Comments: The patient returns to the emergency department for assistance with homelessness. He reports he was asked to leave the facility where he was living and has no where to go. He was seen earlier today for emesis and has not been vomiting since being evaluated. No fever. No pain.   The history is provided by the patient. No language interpreter was used.    Past Medical History  Diagnosis Date  . BPH (benign prostatic hyperplasia)   . CHF (congestive heart failure) ([facility])   . Hypertension   . Diabetes mellitus without complication ([facility])   . Diabetic retinopathy associated with type 2 diabetes mellitus ([facility])   . OSA (obstructive sleep apnea)   . Lymphedema   . TIA (transient ischemic attack)    Past Surgical History  Procedure Laterality Date  . Bladder surgery    . Cardiac catheterization     Family History  Problem Relation Age of Onset  . Cancer Mother   . Cancer Father    Social History  Substance Use Topics  . Smoking status: Never Smoker   . Smokeless tobacco: Never Used  . Alcohol Use: No    Review of Systems  Constitutional: Negative for fever and chills.  HENT: Negative.   Respiratory: Negative.   Cardiovascular: Negative.   Gastrointestinal: Negative.   Musculoskeletal: Negative.   Skin: Negative.   Neurological: Negative.       Allergies  Chicken allergy  Home Medications   Prior to Admission medications   Medication Sig Start Date End Date Taking? Authorizing Provider  acetaminophen (TYLENOL) 325 MG tablet Take 650 mg by mouth 3 (three) times daily as needed for mild pain, moderate pain or headache.     Historical Provider, MD  amLODipine (NORVASC) 10 MG tablet Take  10 mg by mouth daily.    Historical Provider, MD  aspirin EC 81 MG tablet Take 81 mg by mouth daily.    Historical Provider, MD  atorvastatin (LIPITOR) 40 MG tablet Take 20 mg by mouth at bedtime.     Historical Provider, MD  carvedilol (COREG) 25 MG tablet Take 50 mg by mouth daily.     Historical Provider, MD  citalopram (CELEXA) 40 MG tablet Take 40 mg by mouth daily.    Historical Provider, MD  doxazosin (CARDURA) 8 MG tablet Take 4 mg by mouth at bedtime.     Historical Provider, MD  ferrous sulfate 325 (65 FE) MG tablet Take 325 mg by mouth daily with breakfast.    Historical Provider, MD  furosemide (LASIX) 40 MG tablet Take 1 tablet (40 mg total) by mouth every other day. 05/22/15   Rhetta Mura, MD  hydrALAZINE (APRESOLINE) 100 MG tablet Take 100 mg by mouth 3 (three) times daily.    Historical Provider, MD  insulin aspart (NOVOLOG) 100 UNIT/ML injection Inject 3 Units into the skin 3 (three) times daily before meals.    Historical Provider, MD  insulin glargine (LANTUS) 100 UNIT/ML injection Inject 18 Units into the skin at bedtime.     Historical Provider, MD  ipratropium (ATROVENT) 0.02 % nebulizer solution Take 2.5 mLs (0.5 mg total) by nebulization every 2 (two) hours as needed for wheezing or shortness of breath. 05/22/15  Rhetta Mura, MD  isosorbide mononitrate (IMDUR) 30 MG 24 hr tablet Take 30 mg by mouth daily.    Historical Provider, MD  lisinopril (PRINIVIL,ZESTRIL) 5 MG tablet Take 1 tablet (5 mg total) by mouth daily. 05/22/15   Rhetta Mura, MD  loperamide (IMODIUM) 2 MG capsule Take 1 capsule (2 mg total) by mouth 4 (four) times daily as needed for diarrhea or loose stools. 05/30/15   Barrett Henle, PA-C  Multiple Vitamin (MULTIVITAMIN WITH MINERALS) TABS tablet Take 1 tablet by mouth daily.    Historical Provider, MD  ondansetron (ZOFRAN ODT) 4 MG disintegrating tablet Take 1 tablet (4 mg total) by mouth every 8 (eight) hours as needed for nausea  or vomiting. 05/30/15   Barrett Henle, PA-C  oxybutynin (DITROPAN) 5 MG tablet Take 5 mg by mouth 3 (three) times daily.    Historical Provider, MD  pantoprazole (PROTONIX) 40 MG tablet Take 40 mg by mouth daily. Take on an empty stomach 30 minutes before a meal    Historical Provider, MD  polyethylene glycol (MIRALAX / GLYCOLAX) packet Take 17 g by mouth daily as needed for mild constipation or moderate constipation.    Historical Provider, MD  potassium chloride SA (K-DUR,KLOR-CON) 20 MEQ tablet Take 20 mEq by mouth daily.    Historical Provider, MD  silver sulfADIAZINE (SILVADENE) 1 % cream Apply 1 application topically daily as needed (skin).    Historical Provider, MD  spironolactone (ALDACTONE) 25 MG tablet Take 50 mg by mouth daily.     Historical Provider, MD  sulfaSALAzine (AZULFIDINE) 500 MG tablet Take 2,000 mg by mouth 2 (two) times daily.     Historical Provider, MD  tamsulosin (FLOMAX) 0.4 MG CAPS capsule Take 0.4 mg by mouth at bedtime.     Historical Provider, MD   BP 191/100 mmHg  Pulse 75  Temp(Src) 98.1 F (36.7 C) (Oral)  Resp 16  SpO2 98% Physical Exam  Constitutional: He is oriented to person, place, and time. He appears well-developed and well-nourished.  Neck: Normal range of motion.  Pulmonary/Chest: Effort normal.  Musculoskeletal: Normal range of motion.  Neurological: He is alert and oriented to person, place, and time.  Skin: Skin is warm and dry.  Psychiatric: He has a normal mood and affect.    ED Course  Procedures (including critical care time) Labs Review Labs Reviewed - No data to display  Imaging Review No results found. I have personally reviewed and evaluated these images and lab results as part of my medical decision-making.   EKG Interpretation None      MDM   Final diagnoses:  Homeless    The patient is seen by Social Worker, [place], who was able to arrange a bed in a shelter tonight and resources for finding a  residence through social services. He can be discharged to the shelter. Patient understands what he is supposed to do but is unhappy he has to leave tonight and take care of finding a place to stay on his own. Patient discharged.    Elpidio Anis, PA-C 06/01/15 2214  Derwood Kaplan, MD 06/02/15 [phone]

## 2015-06-22 ENCOUNTER — Encounter (HOSPITAL_COMMUNITY): Payer: Self-pay | Admitting: Emergency Medicine

## 2015-06-22 ENCOUNTER — Emergency Department (HOSPITAL_COMMUNITY)
Admission: EM | Admit: 2015-06-22 | Discharge: 2015-06-22 | Disposition: A | Discharge status: Home / Self Care | Payer: Medicare Other | Attending: Emergency Medicine | Admitting: Emergency Medicine

## 2015-06-22 DIAGNOSIS — E11319 Type 2 diabetes mellitus with unspecified diabetic retinopathy without macular edema: Secondary | ICD-10-CM | POA: Insufficient documentation

## 2015-06-22 DIAGNOSIS — Z8673 Personal history of transient ischemic attack (TIA), and cerebral infarction without residual deficits: Secondary | ICD-10-CM | POA: Insufficient documentation

## 2015-06-22 DIAGNOSIS — I509 Heart failure, unspecified: Secondary | ICD-10-CM | POA: Diagnosis not present

## 2015-06-22 DIAGNOSIS — I10 Essential (primary) hypertension: Secondary | ICD-10-CM

## 2015-06-22 DIAGNOSIS — I110 Hypertensive heart disease with heart failure: Secondary | ICD-10-CM | POA: Insufficient documentation

## 2015-06-22 DIAGNOSIS — Z794 Long term (current) use of insulin: Secondary | ICD-10-CM | POA: Insufficient documentation

## 2015-06-22 DIAGNOSIS — Z7982 Long term (current) use of aspirin: Secondary | ICD-10-CM | POA: Insufficient documentation

## 2015-06-22 DIAGNOSIS — G4733 Obstructive sleep apnea (adult) (pediatric): Secondary | ICD-10-CM | POA: Diagnosis not present

## 2015-06-22 DIAGNOSIS — Z79899 Other long term (current) drug therapy: Secondary | ICD-10-CM | POA: Diagnosis not present

## 2015-06-22 NOTE — ED Provider Notes (Signed)
CSN: [id number]     Arrival date & time 06/22/15  1457 History   First MD Initiated Contact with Patient 06/22/15 1510     Chief Complaint  Patient presents with  . Hypertension   HPI   55 year old male presents today with complaints of hypertension. Patient reports that he was routinely checking his blood pressure, and it was noted to be elevated. Patient denies any complaints today including headache, chest pain, shortness of breath, abdominal pain, change in color clarity or characteristics or urine. Patient simply had an elevated blood pressure and wanted to be evaluated. Patient reports that [facility], and is adamant that he is taking all of his medications.  Chart review shows patient has had numerous episodes of hypertension throughout his various ED stays. It appears blood pressure tends to be lower during hospital admission.    Past Medical History  Diagnosis Date  . BPH (benign prostatic hyperplasia)   . CHF (congestive heart failure) (HCC)   . Hypertension   . Diabetes mellitus without complication (HCC)   . Diabetic retinopathy associated with type 2 diabetes mellitus (HCC)   . OSA (obstructive sleep apnea)   . Lymphedema   . TIA (transient ischemic attack)    Past Surgical History  Procedure Laterality Date  . Bladder surgery    . Cardiac catheterization     Family History  Problem Relation Age of Onset  . Cancer Mother   . Cancer Father    Social History  Substance Use Topics  . Smoking status: Never Smoker   . Smokeless tobacco: Never Used  . Alcohol Use: No    Review of Systems  All other systems reviewed and are negative.   Allergies  Chicken allergy  Home Medications   Prior to Admission medications   Medication Sig Start Date End Date Taking? Authorizing Provider  amLODipine (NORVASC) 10 MG tablet Take 10 mg by mouth daily.   Yes Historical Provider, MD  aspirin EC 81 MG tablet Take 81 mg by mouth daily.   Yes  Historical Provider, MD  atorvastatin (LIPITOR) 40 MG tablet Take 20 mg by mouth at bedtime.    Yes Historical Provider, MD  carvedilol (COREG) 25 MG tablet Take 50 mg by mouth at bedtime.    Yes Historical Provider, MD  citalopram (CELEXA) 40 MG tablet Take 40 mg by mouth daily.   Yes Historical Provider, MD  doxazosin (CARDURA) 8 MG tablet Take 4 mg by mouth at bedtime.    Yes Historical Provider, MD  ferrous sulfate 325 (65 FE) MG tablet Take 325 mg by mouth daily with breakfast.   Yes Historical Provider, MD  furosemide (LASIX) 40 MG tablet Take 1 tablet (40 mg total) by mouth every other day. 05/22/15  Yes Rhetta Mura, MD  hydrALAZINE (APRESOLINE) 100 MG tablet Take 100 mg by mouth 3 (three) times daily.   Yes Historical Provider, MD  insulin aspart (NOVOLOG) 100 UNIT/ML injection Inject 3 Units into the skin 3 (three) times daily as needed for high blood sugar.    Yes Historical Provider, MD  insulin glargine (LANTUS) 100 UNIT/ML injection Inject 18 Units into the skin at bedtime.    Yes Historical Provider, MD  isosorbide mononitrate (IMDUR) 30 MG 24 hr tablet Take 30 mg by mouth daily.   Yes Historical Provider, MD  lisinopril (PRINIVIL,ZESTRIL) 5 MG tablet Take 1 tablet (5 mg total) by mouth daily. 05/22/15  Yes Rhetta Mura, MD  Multiple Vitamin (  MULTIVITAMIN WITH MINERALS) TABS tablet Take 1 tablet by mouth daily.   Yes Historical Provider, MD  ondansetron (ZOFRAN ODT) 4 MG disintegrating tablet Take 1 tablet (4 mg total) by mouth every 8 (eight) hours as needed for nausea or vomiting. 05/30/15  Yes Barrett Henle, PA-C  oxybutynin (DITROPAN) 5 MG tablet Take 5 mg by mouth 3 (three) times daily.   Yes Historical Provider, MD  pantoprazole (PROTONIX) 40 MG tablet Take 40 mg by mouth daily. Take on an empty stomach 30 minutes before a meal   Yes Historical Provider, MD  potassium chloride SA (K-DUR,KLOR-CON) 20 MEQ tablet Take 20 mEq by mouth daily.   Yes Historical  Provider, MD  spironolactone (ALDACTONE) 25 MG tablet Take 50 mg by mouth daily.    Yes Historical Provider, MD  sulfaSALAzine (AZULFIDINE) 500 MG tablet Take 2,000 mg by mouth 2 (two) times daily.    Yes Historical Provider, MD  tamsulosin (FLOMAX) 0.4 MG CAPS capsule Take 0.4 mg by mouth at bedtime.    Yes Historical Provider, MD  acetaminophen (TYLENOL) 325 MG tablet Take 650 mg by mouth 3 (three) times daily as needed for mild pain, moderate pain or headache.     Historical Provider, MD  ipratropium (ATROVENT) 0.02 % nebulizer solution Take 2.5 mLs (0.5 mg total) by nebulization every 2 (two) hours as needed for wheezing or shortness of breath. 05/22/15   Rhetta Mura, MD  loperamide (IMODIUM) 2 MG capsule Take 1 capsule (2 mg total) by mouth 4 (four) times daily as needed for diarrhea or loose stools. 05/30/15   Barrett Henle, PA-C  polyethylene glycol [facility] / Ethelene Hal) packet Take 17 g by mouth daily as needed for mild constipation or moderate constipation.    Historical Provider, MD  silver sulfADIAZINE (SILVADENE) 1 % cream Apply 1 application topically daily as needed (skin).    Historical Provider, MD   BP 199/120 mmHg  Pulse 86  Temp(Src) 97.8 F (36.6 C) (Oral)  Resp 18  Ht 5\' 11"  (1.803 m)  Wt 113.717 kg  BMI 34.98 kg/m2  SpO2 96%   Physical Exam  Constitutional: He is oriented to person, place, and time. He appears well-developed and well-nourished.  HENT:  Head: Normocephalic and atraumatic.  Eyes: Conjunctivae are normal. Pupils are equal, round, and reactive to light. Right eye exhibits no discharge. Left eye exhibits no discharge. No scleral icterus.  Neck: Normal range of motion. No JVD present. No tracheal deviation present.  Pulmonary/Chest: Effort normal. No stridor.  Abdominal: Soft. He exhibits no distension and no mass. There is no tenderness. There is no rebound and no guarding.  Neurological: He is alert and oriented to person, place, and time.  He has normal strength. No cranial nerve deficit or sensory deficit. He displays a negative Romberg sign. Coordination normal. GCS eye subscore is 4. GCS verbal subscore is 5. GCS motor subscore is 6.  Reflex Scores:      Patellar reflexes are 2+ on the right side and 2+ on the left side. Skin: Skin is warm and dry. No rash noted. No erythema.  Psychiatric: He has a normal mood and affect. His behavior is normal. Judgment and thought content normal.  Nursing note and vitals reviewed.     ED Course  Procedures (including critical care time) Labs Review Labs Reviewed - No data to display  Imaging Review No results found. I have personally reviewed and evaluated these images and lab results as part of my medical  decision-making.   EKG Interpretation None      MDM   Final diagnoses:  Essential hypertension    Labs:  Imaging:  Consults:  Therapeutics:  Discharge Meds:   Assessment/Plan: 55 year old male presents today with asymptomatic hypertension. Patient has a history of the same, question compliance with medications as he is prescribed significant amount of medication. She reports that he has refills on all medications. Due to patient's asymptomatic presentation, he will be discharged home with encouragement to follow-up with his primary care provider for reevaluation in management of his ongoing hypertension. Patient is given strict return precautions, verbalized understanding and agreement today's plan.        Eyvonne Mechanic, PA-C 06/22/15 2024  Rolland Porter, MD 06/28/15 [phone]

## 2015-06-22 NOTE — Discharge Instructions (Signed)
Please take your medications as directed, contact her primary care and schedule follow-up evaluation. If you experience any concerning signs or symptoms please return for repeat evaluation.

## 2015-06-22 NOTE — ED Notes (Signed)
Diet ginger ale, peanut butter, graham crackers, and [place] sandwich given.

## 2015-06-22 NOTE — ED Notes (Signed)
Pt gave nurse a phone number for [place] to call so he could go home but there was no answer

## 2015-06-22 NOTE — ED Notes (Signed)
[name]/roommate [phone] states call back at 1930 and will get pt a ride.

## 2015-06-22 NOTE — ED Notes (Signed)
Per EMS pt complaint of increased lower extremity swelling and hypertension.

## 2015-06-22 NOTE — ED Notes (Signed)
Trey Paula, PA made aware of pts blood pressure.

## 2015-06-22 NOTE — ED Notes (Signed)
This RN attempted to call pt's roommate with no answer. Will try again in a few min

## 2015-07-12 ENCOUNTER — Encounter (HOSPITAL_COMMUNITY): Payer: Self-pay | Admitting: Nurse Practitioner

## 2015-07-12 ENCOUNTER — Emergency Department (HOSPITAL_COMMUNITY)
Admission: EM | Admit: 2015-07-12 | Discharge: 2015-07-12 | Disposition: A | Discharge status: Home / Self Care | Payer: Medicare Other | Attending: Emergency Medicine | Admitting: Emergency Medicine

## 2015-07-12 DIAGNOSIS — I509 Heart failure, unspecified: Secondary | ICD-10-CM | POA: Diagnosis not present

## 2015-07-12 DIAGNOSIS — Z8673 Personal history of transient ischemic attack (TIA), and cerebral infarction without residual deficits: Secondary | ICD-10-CM | POA: Diagnosis not present

## 2015-07-12 DIAGNOSIS — Z7982 Long term (current) use of aspirin: Secondary | ICD-10-CM | POA: Diagnosis not present

## 2015-07-12 DIAGNOSIS — Z794 Long term (current) use of insulin: Secondary | ICD-10-CM | POA: Insufficient documentation

## 2015-07-12 DIAGNOSIS — E11319 Type 2 diabetes mellitus with unspecified diabetic retinopathy without macular edema: Secondary | ICD-10-CM | POA: Insufficient documentation

## 2015-07-12 DIAGNOSIS — I10 Essential (primary) hypertension: Secondary | ICD-10-CM | POA: Diagnosis present

## 2015-07-12 DIAGNOSIS — Z79899 Other long term (current) drug therapy: Secondary | ICD-10-CM | POA: Insufficient documentation

## 2015-07-12 DIAGNOSIS — I110 Hypertensive heart disease with heart failure: Secondary | ICD-10-CM | POA: Diagnosis not present

## 2015-07-12 NOTE — ED Notes (Signed)
Bed: WA04 Expected date:  Expected time:  Means of arrival:  Comments: From facility- hypertensive

## 2015-07-12 NOTE — Discharge Instructions (Signed)
Continue taking your home blood pressure medications as prescribed. I recommend following up with your primary care provider within the next week to have your blood pressure rechecked. Return to the emergency department if your blood pressure increases or new onset of fever, headache, visual changes, lightheadedness, dizziness, difficulty breathing, chest pain, abdominal pain, numbness, tingling, weakness.

## 2015-07-12 NOTE — ED Notes (Signed)
Pt is presented from [facility], staff sent pt for evaluation of his BP that has persistently elevated through the day with highest systolic being 216, pt has no complaints including vision change or headache. Familiar to the department for the same, endorses adherence to his antihypertensives.

## 2015-07-12 NOTE — ED Provider Notes (Signed)
CSN: [id number]     Arrival date & time 07/12/15  1648 History   First MD Initiated Contact with Patient 07/12/15 1706     Chief Complaint  Patient presents with  . Hypertension     (Consider location/radiation/quality/duration/timing/severity/associated sxs/prior Treatment) HPI   Patient is a 55 year old male with past medical history of hypertension, diabetes, TIA, CHF who presents to the ED from [facility] with complaint of elevated blood pressure. Patient reports that the staff had been checking his blood pressure multiple times throughout the day and was noted to have an elevated systolic pressure in the upper 190s. Patient denies any complaints or pain. Endorses taking his blood pressure medication as prescribed. Denies any recent changes in blood pressure medications. Denies fever, chills, headache, visual changes, lightheadedness, dizziness, cough, shortness of breath, chest pain, palpitations, abdominal pain, numbness, tingling, weakness. Patient reports he was sent to the ED for evaluation due to having multiple elevated blood pressures today.  Past Medical History  Diagnosis Date  . BPH (benign prostatic hyperplasia)   . CHF (congestive heart failure) (HCC)   . Hypertension   . Diabetes mellitus without complication (HCC)   . Diabetic retinopathy associated with type 2 diabetes mellitus (HCC)   . OSA (obstructive sleep apnea)   . Lymphedema   . TIA (transient ischemic attack)    Past Surgical History  Procedure Laterality Date  . Bladder surgery    . Cardiac catheterization     Family History  Problem Relation Age of Onset  . Cancer Mother   . Cancer Father    Social History  Substance Use Topics  . Smoking status: Never Smoker   . Smokeless tobacco: Never Used  . Alcohol Use: No    Review of Systems  All other systems reviewed and are negative.     Allergies  Chicken allergy  Home Medications   Prior to Admission medications    Medication Sig Start Date End Date Taking? Authorizing Provider  acetaminophen (TYLENOL) 325 MG tablet Take 650 mg by mouth 3 (three) times daily as needed for mild pain, moderate pain or headache.     Historical Provider, MD  amLODipine (NORVASC) 10 MG tablet Take 10 mg by mouth daily.    Historical Provider, MD  aspirin EC 81 MG tablet Take 81 mg by mouth daily.    Historical Provider, MD  atorvastatin (LIPITOR) 40 MG tablet Take 20 mg by mouth at bedtime.     Historical Provider, MD  carvedilol (COREG) 25 MG tablet Take 50 mg by mouth at bedtime.     Historical Provider, MD  citalopram (CELEXA) 40 MG tablet Take 40 mg by mouth daily.    Historical Provider, MD  doxazosin (CARDURA) 8 MG tablet Take 4 mg by mouth at bedtime.     Historical Provider, MD  ferrous sulfate 325 (65 FE) MG tablet Take 325 mg by mouth daily with breakfast.    Historical Provider, MD  furosemide (LASIX) 40 MG tablet Take 1 tablet (40 mg total) by mouth every other day. 05/22/15   Rhetta Mura, MD  hydrALAZINE (APRESOLINE) 100 MG tablet Take 100 mg by mouth 3 (three) times daily.    Historical Provider, MD  insulin aspart (NOVOLOG) 100 UNIT/ML injection Inject 3 Units into the skin 3 (three) times daily as needed for high blood sugar.     Historical Provider, MD  insulin glargine (LANTUS) 100 UNIT/ML injection Inject 18 Units into the skin at bedtime.  Historical Provider, MD  ipratropium (ATROVENT) 0.02 % nebulizer solution Take 2.5 mLs (0.5 mg total) by nebulization every 2 (two) hours as needed for wheezing or shortness of breath. 05/22/15   Rhetta Mura, MD  isosorbide mononitrate (IMDUR) 30 MG 24 hr tablet Take 30 mg by mouth daily.    Historical Provider, MD  lisinopril (PRINIVIL,ZESTRIL) 5 MG tablet Take 1 tablet (5 mg total) by mouth daily. 05/22/15   Rhetta Mura, MD  loperamide (IMODIUM) 2 MG capsule Take 1 capsule (2 mg total) by mouth 4 (four) times daily as needed for diarrhea or loose  stools. 05/30/15   Barrett Henle, PA-C  Multiple Vitamin (MULTIVITAMIN WITH MINERALS) TABS tablet Take 1 tablet by mouth daily.    Historical Provider, MD  ondansetron (ZOFRAN ODT) 4 MG disintegrating tablet Take 1 tablet (4 mg total) by mouth every 8 (eight) hours as needed for nausea or vomiting. 05/30/15   Barrett Henle, PA-C  oxybutynin (DITROPAN) 5 MG tablet Take 5 mg by mouth 3 (three) times daily.    Historical Provider, MD  pantoprazole (PROTONIX) 40 MG tablet Take 40 mg by mouth daily. Take on an empty stomach 30 minutes before a meal    Historical Provider, MD  polyethylene glycol (MIRALAX / GLYCOLAX) packet Take 17 g by mouth daily as needed for mild constipation or moderate constipation.    Historical Provider, MD  potassium chloride SA (K-DUR,KLOR-CON) 20 MEQ tablet Take 20 mEq by mouth daily.    Historical Provider, MD  silver sulfADIAZINE (SILVADENE) 1 % cream Apply 1 application topically daily as needed (skin).    Historical Provider, MD  spironolactone (ALDACTONE) 25 MG tablet Take 50 mg by mouth daily.     Historical Provider, MD  sulfaSALAzine (AZULFIDINE) 500 MG tablet Take 2,000 mg by mouth 2 (two) times daily.     Historical Provider, MD  tamsulosin (FLOMAX) 0.4 MG CAPS capsule Take 0.4 mg by mouth at bedtime.     Historical Provider, MD   BP 124/68 mmHg  Pulse 62  Temp(Src) 97.6 F (36.4 C) (Oral)  Resp 18  SpO2 96% Physical Exam  Constitutional: He is oriented to person, place, and time. He appears well-developed and well-nourished. No distress.  HENT:  Head: Normocephalic and atraumatic.  Mouth/Throat: Oropharynx is clear and moist. No oropharyngeal exudate.  Eyes: Conjunctivae and EOM are normal. Pupils are equal, round, and reactive to light. Right eye exhibits no discharge. Left eye exhibits no discharge. No scleral icterus.  Neck: Normal range of motion. Neck supple.  Cardiovascular: Normal rate, regular rhythm, normal heart sounds and intact  distal pulses.   Pulmonary/Chest: Effort normal and breath sounds normal. No respiratory distress. He has no wheezes. He has no rales. He exhibits no tenderness.  Abdominal: Soft. Bowel sounds are normal. He exhibits no distension and no mass. There is no tenderness. There is no rebound and no guarding.  Musculoskeletal: Normal range of motion. He exhibits no edema.  2+ pitting edema noted to BLE which pt reports is chronic  Neurological: He is alert and oriented to person, place, and time. He has normal strength. No cranial nerve deficit or sensory deficit. Coordination normal.  Skin: Skin is warm and dry. He is not diaphoretic.  Nursing note and vitals reviewed.   ED Course  Procedures (including critical care time) Labs Review Labs Reviewed - No data to display  Imaging Review No results found. I have personally reviewed and evaluated these images and lab results as  part of my medical decision-making.   EKG Interpretation None      MDM   Final diagnoses:  HTN (hypertension), benign    Patient presents with elevated blood pressure today. History of hypertension. Patient reports taking his medications as prescribed. Denies any pain or complaints. VSS, BP 124/68. Exam revealed 2+ pitting edema to BLE with pt reports is chronic and unchanged, remaining exam unremarkable. No neuro deficits. Repeat BP after initial evaluation 121/68. Patient continues to deny any pain or complete at this time. No signs of hypertensive emergency or urgency at this time. Pt remains hemodynamically stable while in the ED. Plan to discharge patient home and advised patient to continue taking his home medications as prescribed. Advised pt to follow up with his PCP in the next week to have his BP rechecked. Discussed stric return precautions with pt.       Satira Sark [place], [place] 07/12/15 1809  Tilden Fossa, MD 07/14/15 1911

## 2015-07-12 NOTE — ED Notes (Signed)
Requested for pt to call for ride as he is ready for discharge.

## 2015-07-19 ENCOUNTER — Encounter (HOSPITAL_COMMUNITY): Payer: Self-pay | Admitting: *Deleted

## 2015-07-19 ENCOUNTER — Emergency Department (HOSPITAL_COMMUNITY)
Admission: EM | Admit: 2015-07-19 | Discharge: 2015-07-19 | Disposition: A | Discharge status: Home / Self Care | Payer: Medicare Other | Attending: Emergency Medicine | Admitting: Emergency Medicine

## 2015-07-19 DIAGNOSIS — Z794 Long term (current) use of insulin: Secondary | ICD-10-CM | POA: Diagnosis not present

## 2015-07-19 DIAGNOSIS — Z791 Long term (current) use of non-steroidal anti-inflammatories (NSAID): Secondary | ICD-10-CM | POA: Diagnosis not present

## 2015-07-19 DIAGNOSIS — I509 Heart failure, unspecified: Secondary | ICD-10-CM | POA: Diagnosis not present

## 2015-07-19 DIAGNOSIS — Z7982 Long term (current) use of aspirin: Secondary | ICD-10-CM | POA: Insufficient documentation

## 2015-07-19 DIAGNOSIS — I10 Essential (primary) hypertension: Secondary | ICD-10-CM | POA: Diagnosis present

## 2015-07-19 DIAGNOSIS — M7989 Other specified soft tissue disorders: Secondary | ICD-10-CM | POA: Diagnosis not present

## 2015-07-19 DIAGNOSIS — I110 Hypertensive heart disease with heart failure: Secondary | ICD-10-CM | POA: Insufficient documentation

## 2015-07-19 DIAGNOSIS — Z79899 Other long term (current) drug therapy: Secondary | ICD-10-CM | POA: Diagnosis not present

## 2015-07-19 DIAGNOSIS — E119 Type 2 diabetes mellitus without complications: Secondary | ICD-10-CM | POA: Insufficient documentation

## 2015-07-19 LAB — BASIC METABOLIC PANEL
Anion gap: 7 (ref 5–15)
BUN: 42 mg/dL — AB (ref 6–20)
CALCIUM: 9.6 mg/dL (ref 8.9–10.3)
CHLORIDE: 104 mmol/L (ref 101–111)
CO2: 31 mmol/L (ref 22–32)
CREATININE: 1 mg/dL (ref 0.61–1.24)
GFR calc Af Amer: >60 mL/min (ref >60)
GFR calc non Af Amer: >60 mL/min (ref >60)
Glucose, Bld: 102 mg/dL — ABNORMAL HIGH (ref 65–99)
Potassium: 3.9 mmol/L (ref 3.5–5.1)
Sodium: 142 mmol/L (ref 135–145)

## 2015-07-19 MED ORDER — LISINOPRIL 20 MG PO TABS: 20.0000 mg | ORAL_TABLET | Freq: Once | ORAL | Status: DC

## 2015-07-19 MED ORDER — HYDRALAZINE HCL 50 MG PO TABS
100.0000 mg | ORAL_TABLET | Freq: Once | ORAL | Status: AC
  Administered 2015-07-19: 100 mg via ORAL
  Filled 2015-07-19: qty 2

## 2015-07-19 MED ORDER — LISINOPRIL 10 MG PO TABS
10.0000 mg | ORAL_TABLET | Freq: Once | ORAL | Status: AC
  Administered 2015-07-19: 10 mg via ORAL
  Filled 2015-07-19: qty 1

## 2015-07-19 MED ORDER — AMLODIPINE BESYLATE 10 MG PO TABS
10.0000 mg | ORAL_TABLET | Freq: Once | ORAL | Status: AC
  Administered 2015-07-19: 10 mg via ORAL
  Filled 2015-07-19: qty 1

## 2015-07-19 NOTE — ED Notes (Signed)
Per [facility], pt from [facility], staff reports elevated BP this am.  Last reading from [facility] was186/96.  Pt has R side weakness and off balance from a previous CVA.  Pt is A&Ox 4.  Denis any complaints at this time.

## 2015-07-19 NOTE — Discharge Instructions (Signed)
Continue your medications as prescribed. Check and record your blood pressure once daily. If you take your blood pressure medications in the morning check your pressures several hours later. Call your primary care physician for a follow-up appointment.  Hypertension Hypertension, commonly called high blood pressure, is when the force of blood pumping through your arteries is too strong. Your arteries are the blood vessels that carry blood from your heart throughout your body. A blood pressure reading consists of a higher number over a lower number, such as 110/72. The higher number (systolic) is the pressure inside your arteries when your heart pumps. The lower number (diastolic) is the pressure inside your arteries when your heart relaxes. Ideally you want your blood pressure below 120/80. Hypertension forces your heart to work harder to pump blood. Your arteries may become narrow or stiff. Having untreated or uncontrolled hypertension can cause heart attack, stroke, kidney disease, and other problems. RISK FACTORS Some risk factors for high blood pressure are controllable. Others are not.  Risk factors you cannot control include:   Race. You may be at higher risk if you are African American.  Age. Risk increases with age.  Gender. Men are at higher risk than women before age 52 years. After age 44, women are at higher risk than men. Risk factors you can control include:  Not getting enough exercise or physical activity.  Being overweight.  Getting too much fat, sugar, calories, or salt in your diet.  Drinking too much alcohol. SIGNS AND SYMPTOMS Hypertension does not usually cause signs or symptoms. Extremely high blood pressure (hypertensive crisis) may cause headache, anxiety, shortness of breath, and nosebleed. DIAGNOSIS To check if you have hypertension, your health care provider will measure your blood pressure while you are seated, with your arm held at the level of your heart.  It should be measured at least twice using the same arm. Certain conditions can cause a difference in blood pressure between your right and left arms. A blood pressure reading that is higher than normal on one occasion does not mean that you need treatment. If it is not clear whether you have high blood pressure, you may be asked to return on a different day to have your blood pressure checked again. Or, you may be asked to monitor your blood pressure at home for 1 or more weeks. TREATMENT Treating high blood pressure includes making lifestyle changes and possibly taking medicine. Living a healthy lifestyle can help lower high blood pressure. You may need to change some of your habits. Lifestyle changes may include:  Following the DASH diet. This diet is high in fruits, vegetables, and whole grains. It is low in salt, red meat, and added sugars.  Keep your sodium intake below 2,300 mg per day.  Getting at least 30-45 minutes of aerobic exercise at least 4 times per week.  Losing weight if necessary.  Not smoking.  Limiting alcoholic beverages.  Learning ways to reduce stress. Your health care provider may prescribe medicine if lifestyle changes are not enough to get your blood pressure under control, and if one of the following is true:  You are 80-75 years of age and your systolic blood pressure is above 140.  You are 39 years of age or older, and your systolic blood pressure is above 150.  Your diastolic blood pressure is above 90.  You have diabetes, and your systolic blood pressure is over 140 or your diastolic blood pressure is over 90.  You have kidney disease  and your blood pressure is above 140/90.  You have heart disease and your blood pressure is above 140/90. Your personal target blood pressure may vary depending on your medical conditions, your age, and other factors. HOME CARE INSTRUCTIONS  Have your blood pressure rechecked as directed by your health care provider.    Take medicines only as directed by your health care provider. Follow the directions carefully. Blood pressure medicines must be taken as prescribed. The medicine does not work as well when you skip doses. Skipping doses also puts you at risk for problems.  Do not smoke.   Monitor your blood pressure at home as directed by your health care provider. SEEK MEDICAL CARE IF:   You think you are having a reaction to medicines taken.  You have recurrent headaches or feel dizzy.  You have swelling in your ankles.  You have trouble with your vision. SEEK IMMEDIATE MEDICAL CARE IF:  You develop a severe headache or confusion.  You have unusual weakness, numbness, or feel faint.  You have severe chest or abdominal pain.  You vomit repeatedly.  You have trouble breathing. MAKE SURE YOU:   Understand these instructions.  Will watch your condition.  Will get help right away if you are not doing well or get worse.   This information is not intended to replace advice given to you by your health care provider. Make sure you discuss any questions you have with your health care provider.   Document Released: 01/20/2005 Document Revised: 06/06/2014 Document Reviewed: 11/12/2012 Elsevier Interactive Patient Education [employer]

## 2015-07-19 NOTE — ED Provider Notes (Signed)
CSN: [id number]     Arrival date & time 07/19/15  1246 History   First MD Initiated Contact with Patient 07/19/15 1413     Chief Complaint  Patient presents with  . Hypertension     HPI  Patient presents via EMS from a home where he lives. His complaint is blood pressure was high this morning. His other complaint is that he would like hamburger. States he kept checking his blood pressures morning and they were persistently high so he presents here. Denies chest pain shortness of breath. States he always has some swelling in his legs. He described to me his medications. Cannot tell me dosages. States he took all his medicine this morning "except 1". He does not recall which one.  Past Medical History  Diagnosis Date  . BPH (benign prostatic hyperplasia)   . CHF (congestive heart failure) (HCC)   . Hypertension   . Diabetes mellitus without complication (HCC)   . Diabetic retinopathy associated with type 2 diabetes mellitus (HCC)   . OSA (obstructive sleep apnea)   . Lymphedema   . TIA (transient ischemic attack)    Past Surgical History  Procedure Laterality Date  . Bladder surgery    . Cardiac catheterization     Family History  Problem Relation Age of Onset  . Cancer Mother   . Cancer Father    Social History  Substance Use Topics  . Smoking status: Never Smoker   . Smokeless tobacco: Never Used  . Alcohol Use: No    Review of Systems  Constitutional: Negative for fever, chills, diaphoresis, appetite change and fatigue.  HENT: Negative for mouth sores, sore throat and trouble swallowing.   Eyes: Negative for visual disturbance.  Respiratory: Negative for cough, chest tightness, shortness of breath and wheezing.   Cardiovascular: Positive for leg swelling. Negative for chest pain.  Gastrointestinal: Negative for nausea, vomiting, abdominal pain, diarrhea and abdominal distention.  Endocrine: Negative for polydipsia, polyphagia and polyuria.  Genitourinary: Negative for  dysuria, frequency and hematuria.  Musculoskeletal: Negative for gait problem.  Skin: Negative for color change, pallor and rash.  Neurological: Negative for dizziness, syncope, light-headedness and headaches.  Hematological: Does not bruise/bleed easily.  Psychiatric/Behavioral: Negative for behavioral problems and confusion.      Allergies  Chicken allergy  Home Medications   Prior to Admission medications   Medication Sig Start Date End Date Taking? Authorizing Provider  acetaminophen (TYLENOL) 325 MG tablet Take 650 mg by mouth 3 (three) times daily as needed for mild pain, moderate pain or headache.    Yes Historical Provider, MD  amLODipine (NORVASC) 10 MG tablet Take 10 mg by mouth daily.   Yes Historical Provider, MD  aspirin 325 MG tablet Take 325 mg by mouth daily.   Yes Historical Provider, MD  atorvastatin (LIPITOR) 40 MG tablet Take 20 mg by mouth at bedtime.    Yes Historical Provider, MD  bumetanide (BUMEX) 2 MG tablet Take 2 mg by mouth 2 (two) times daily.   Yes Historical Provider, MD  Calcium Carbonate-Vitamin D (CALCIUM-VITAMIN D) 500-200 MG-UNIT tablet Take 1 tablet by mouth 2 (two) times daily.   Yes Historical Provider, MD  carvedilol (COREG) 25 MG tablet Take 50 mg by mouth at bedtime.    Yes Historical Provider, MD  citalopram (CELEXA) 40 MG tablet Take 40 mg by mouth daily.   Yes Historical Provider, MD  docusate sodium (COLACE) 100 MG capsule Take 100 mg by mouth daily as needed for mild  constipation.   Yes Historical Provider, MD  doxazosin (CARDURA) 8 MG tablet Take 4 mg by mouth at bedtime.    Yes Historical Provider, MD  ferrous sulfate 325 (65 FE) MG tablet Take 325 mg by mouth daily with breakfast.   Yes Historical Provider, MD  hydrALAZINE (APRESOLINE) 100 MG tablet Take 100 mg by mouth 3 (three) times daily.   Yes Historical Provider, MD  hydrocerin (EUCERIN) CREA Apply 1 application topically daily.   Yes Historical Provider, MD  ibuprofen  (ADVIL,MOTRIN) 800 MG tablet Take 800 mg by mouth 3 (three) times daily as needed for headache or moderate pain (with food).   Yes Historical Provider, MD  insulin aspart (NOVOLOG) 100 UNIT/ML injection Inject 3 Units into the skin 3 (three) times daily before meals.    Yes Historical Provider, MD  insulin glargine (LANTUS) 100 UNIT/ML injection Inject 18 Units into the skin at bedtime.    Yes Historical Provider, MD  lisinopril (PRINIVIL,ZESTRIL) 10 MG tablet Take 10 mg by mouth daily.   Yes Historical Provider, MD  metolazone (ZAROXOLYN) 5 MG tablet Take 5 mg by mouth every 3 (three) days.   Yes Historical Provider, MD  Multiple Vitamin (MULTIVITAMIN WITH MINERALS) TABS tablet Take 1 tablet by mouth daily.   Yes Historical Provider, MD  ondansetron (ZOFRAN ODT) 4 MG disintegrating tablet Take 1 tablet (4 mg total) by mouth every 8 (eight) hours as needed for nausea or vomiting. 05/30/15  Yes Barrett Henle, PA-C  oxybutynin (DITROPAN) 5 MG tablet Take 5 mg by mouth 3 (three) times daily.   Yes Historical Provider, MD  pantoprazole (PROTONIX) 40 MG tablet Take 40 mg by mouth daily. Take on an empty stomach 30 minutes before a meal   Yes Historical Provider, MD  potassium chloride SA (K-DUR,KLOR-CON) 20 MEQ tablet Take 20 mEq by mouth daily.   Yes Historical Provider, MD  silver sulfADIAZINE (SILVADENE) 1 % cream Apply 1 application topically daily.    Yes Historical Provider, MD  spironolactone (ALDACTONE) 25 MG tablet Take 50 mg by mouth daily.    Yes Historical Provider, MD  sulfaSALAzine (AZULFIDINE) 500 MG tablet Take 2,000 mg by mouth 2 (two) times daily.    Yes Historical Provider, MD  tamsulosin (FLOMAX) 0.4 MG CAPS capsule Take 0.4 mg by mouth at bedtime.    Yes Historical Provider, MD  furosemide (LASIX) 40 MG tablet Take 1 tablet (40 mg total) by mouth every other day. 05/22/15   Rhetta Mura, MD  ipratropium (ATROVENT) 0.02 % nebulizer solution Take 2.5 mLs (0.5 mg total) by  nebulization every 2 (two) hours as needed for wheezing or shortness of breath. 05/22/15   Rhetta Mura, MD  isosorbide mononitrate (IMDUR) 30 MG 24 hr tablet Take 30 mg by mouth daily.    Historical Provider, MD  lisinopril (PRINIVIL,ZESTRIL) 5 MG tablet Take 1 tablet (5 mg total) by mouth daily. Patient not taking: Reported on 07/19/2015 05/22/15   Rhetta Mura, MD  loperamide (IMODIUM) 2 MG capsule Take 1 capsule (2 mg total) by mouth 4 (four) times daily as needed for diarrhea or loose stools. 05/30/15   Barrett Henle, PA-C  polyethylene glycol [facility]) packet Take 17 g by mouth daily as needed for mild constipation or moderate constipation.    Historical Provider, MD   BP 199/110 mmHg  Pulse 80  Temp(Src) 98.1 F (36.7 C) (Oral)  Resp 14  Ht 5\' 11"  (1.803 m)  Wt 235 lb (106.595 kg)  BMI 32.79 kg/m2  SpO2 94% Physical Exam  Constitutional: He is oriented to person, place, and time. He appears well-developed and well-nourished. No distress.  HENT:  Head: Normocephalic.  Eyes: Conjunctivae are normal. Pupils are equal, round, and reactive to light. No scleral icterus.  Neck: Normal range of motion. Neck supple. No thyromegaly present.  Cardiovascular: Normal rate and regular rhythm.  Exam reveals no gallop and no friction rub.   No murmur heard. Pulmonary/Chest: Effort normal and breath sounds normal. No respiratory distress. He has no wheezes. He has no rales.  Abdominal: Soft. Bowel sounds are normal. He exhibits no distension. There is no tenderness. There is no rebound.  Musculoskeletal: Normal range of motion.  Neurological: He is alert and oriented to person, place, and time.  Right hemiparesis described in previous charts. Secondary to prior CVA.  Skin: Skin is warm and dry. No rash noted.  Psychiatric: He has a normal mood and affect. His behavior is normal.    ED Course  Procedures (including critical care time) Labs Review Labs Reviewed   BASIC METABOLIC PANEL - Abnormal; Notable for the following:    Glucose, Bld 102 (*)    BUN 42 (*)    All other components within normal limits    Imaging Review No results found. I have personally reviewed and evaluated these images and lab results as part of my medical decision-making.   EKG Interpretation None      MDM   Final diagnoses:  Essential hypertension    Patient asymptomatic. However, patient with multiple recent presentations with elevated pressures. States he is compliant. I question his compliance due to the persistent nature of his recurrent episodes. We'll check kidney function. We'll treat with his daily medications. Reevaluate after labs.   Rolland Porter, MD 07/19/15 [phone]

## 2015-07-22 ENCOUNTER — Emergency Department (HOSPITAL_COMMUNITY)
Admission: EM | Admit: 2015-07-22 | Discharge: 2015-07-22 | Disposition: A | Discharge status: Home / Self Care | Payer: Medicare Other | Attending: Emergency Medicine | Admitting: Emergency Medicine

## 2015-07-22 ENCOUNTER — Encounter (HOSPITAL_COMMUNITY): Payer: Self-pay | Admitting: Emergency Medicine

## 2015-07-22 DIAGNOSIS — K91873 Postprocedural seroma of a digestive system organ or structure following other procedure: Secondary | ICD-10-CM | POA: Diagnosis not present

## 2015-07-22 DIAGNOSIS — I509 Heart failure, unspecified: Secondary | ICD-10-CM | POA: Insufficient documentation

## 2015-07-22 DIAGNOSIS — Z7982 Long term (current) use of aspirin: Secondary | ICD-10-CM | POA: Diagnosis not present

## 2015-07-22 DIAGNOSIS — E11319 Type 2 diabetes mellitus with unspecified diabetic retinopathy without macular edema: Secondary | ICD-10-CM | POA: Diagnosis not present

## 2015-07-22 DIAGNOSIS — Z79899 Other long term (current) drug therapy: Secondary | ICD-10-CM | POA: Diagnosis not present

## 2015-07-22 DIAGNOSIS — I110 Hypertensive heart disease with heart failure: Secondary | ICD-10-CM | POA: Diagnosis not present

## 2015-07-22 DIAGNOSIS — Z8673 Personal history of transient ischemic attack (TIA), and cerebral infarction without residual deficits: Secondary | ICD-10-CM | POA: Insufficient documentation

## 2015-07-22 DIAGNOSIS — R531 Weakness: Secondary | ICD-10-CM | POA: Diagnosis present

## 2015-07-22 DIAGNOSIS — Z794 Long term (current) use of insulin: Secondary | ICD-10-CM | POA: Insufficient documentation

## 2015-07-22 DIAGNOSIS — IMO0002 Reserved for concepts with insufficient information to code with codable children: Secondary | ICD-10-CM

## 2015-07-22 NOTE — ED Notes (Addendum)
Per EMS, pt from [facility]. Pt reports surgery to right groin to remove lymph node on 6/8. Since surgery pt has c/o hematoma that has gotten progressively worse. While walking today pt states he had "twinge" in his groin.

## 2015-07-22 NOTE — Discharge Instructions (Signed)
This is likely a seroma after your surgery. Follow-up with the [place].

## 2015-07-22 NOTE — ED Provider Notes (Signed)
CSN: [id number]     Arrival date & time 07/22/15  1626 History   First MD Initiated Contact with Patient 07/22/15 1852     Chief Complaint  Patient presents with  . Post-op Problem     HPI Around 2 weeks ago patient had a right inguinal lymph node removed to evaluate for cancer at the [place]. He states it was not cancer. Since then he has had some swelling of the site. It has become more swollen last couple days. There is mild aching states the pain is not too severe. No fevers or chills. States he does occasionally have some liquid in the area that he thinks may have drained out of it. No fevers or chills. No cough. No trauma. No swelling in that leg. Patient states he get some numbness in that right leg 2. States he is previously had a stroke on that side.   Past Medical History  Diagnosis Date  . BPH (benign prostatic hyperplasia)   . CHF (congestive heart failure) (HCC)   . Hypertension   . Diabetes mellitus without complication (HCC)   . Diabetic retinopathy associated with type 2 diabetes mellitus (HCC)   . OSA (obstructive sleep apnea)   . Lymphedema   . TIA (transient ischemic attack)    Past Surgical History  Procedure Laterality Date  . Bladder surgery    . Cardiac catheterization     Family History  Problem Relation Age of Onset  . Cancer Mother   . Cancer Father    Social History  Substance Use Topics  . Smoking status: Never Smoker   . Smokeless tobacco: Never Used  . Alcohol Use: No    Review of Systems  Constitutional: Negative for chills, activity change and appetite change.  Eyes: Negative for pain.  Respiratory: Negative for chest tightness and shortness of breath.   Cardiovascular: Negative for chest pain and leg swelling.  Gastrointestinal: Negative for abdominal pain and diarrhea.  Genitourinary: Negative for flank pain.  Musculoskeletal: Negative for back pain and neck stiffness.  Skin: Positive for wound. Negative for rash.  Neurological: Positive  for weakness. Negative for numbness and headaches.      Allergies  Chicken allergy  Home Medications   Prior to Admission medications   Medication Sig Start Date End Date Taking? Authorizing Provider  acetaminophen (TYLENOL) 325 MG tablet Take 650 mg by mouth 3 (three) times daily as needed for mild pain, moderate pain or headache.     Historical Provider, MD  amLODipine (NORVASC) 10 MG tablet Take 10 mg by mouth daily.    Historical Provider, MD  aspirin 325 MG tablet Take 325 mg by mouth daily.    Historical Provider, MD  atorvastatin (LIPITOR) 40 MG tablet Take 20 mg by mouth at bedtime.     Historical Provider, MD  bumetanide (BUMEX) 2 MG tablet Take 2 mg by mouth 2 (two) times daily.    Historical Provider, MD  Calcium Carbonate-Vitamin D (CALCIUM-VITAMIN D) 500-200 MG-UNIT tablet Take 1 tablet by mouth 2 (two) times daily.    Historical Provider, MD  carvedilol (COREG) 25 MG tablet Take 50 mg by mouth at bedtime.     Historical Provider, MD  citalopram (CELEXA) 40 MG tablet Take 40 mg by mouth daily.    Historical Provider, MD  docusate sodium (COLACE) 100 MG capsule Take 100 mg by mouth daily as needed for mild constipation.    Historical Provider, MD  doxazosin (CARDURA) 8 MG tablet Take 4 mg  by mouth at bedtime.     Historical Provider, MD  ferrous sulfate 325 (65 FE) MG tablet Take 325 mg by mouth daily with breakfast.    Historical Provider, MD  furosemide (LASIX) 40 MG tablet Take 1 tablet (40 mg total) by mouth every other day. 05/22/15   Rhetta Mura, MD  hydrALAZINE (APRESOLINE) 100 MG tablet Take 100 mg by mouth 3 (three) times daily.    Historical Provider, MD  hydrocerin (EUCERIN) CREA Apply 1 application topically daily.    Historical Provider, MD  ibuprofen (ADVIL,MOTRIN) 800 MG tablet Take 800 mg by mouth 3 (three) times daily as needed for headache or moderate pain (with food).    Historical Provider, MD  insulin aspart (NOVOLOG) 100 UNIT/ML injection Inject 3  Units into the skin 3 (three) times daily before meals.     Historical Provider, MD  insulin glargine (LANTUS) 100 UNIT/ML injection Inject 18 Units into the skin at bedtime.     Historical Provider, MD  ipratropium (ATROVENT) 0.02 % nebulizer solution Take 2.5 mLs (0.5 mg total) by nebulization every 2 (two) hours as needed for wheezing or shortness of breath. 05/22/15   Rhetta Mura, MD  isosorbide mononitrate (IMDUR) 30 MG 24 hr tablet Take 30 mg by mouth daily.    Historical Provider, MD  lisinopril (PRINIVIL,ZESTRIL) 10 MG tablet Take 10 mg by mouth daily.    Historical Provider, MD  lisinopril (PRINIVIL,ZESTRIL) 5 MG tablet Take 1 tablet (5 mg total) by mouth daily. Patient not taking: Reported on 07/19/2015 05/22/15   Rhetta Mura, MD  loperamide (IMODIUM) 2 MG capsule Take 1 capsule (2 mg total) by mouth 4 (four) times daily as needed for diarrhea or loose stools. 05/30/15   Barrett Henle, PA-C  metolazone (ZAROXOLYN) 5 MG tablet Take 5 mg by mouth every 3 (three) days.    Historical Provider, MD  Multiple Vitamin (MULTIVITAMIN WITH MINERALS) TABS tablet Take 1 tablet by mouth daily.    Historical Provider, MD  ondansetron (ZOFRAN ODT) 4 MG disintegrating tablet Take 1 tablet (4 mg total) by mouth every 8 (eight) hours as needed for nausea or vomiting. 05/30/15   Barrett Henle, PA-C  oxybutynin (DITROPAN) 5 MG tablet Take 5 mg by mouth 3 (three) times daily.    Historical Provider, MD  pantoprazole (PROTONIX) 40 MG tablet Take 40 mg by mouth daily. Take on an empty stomach 30 minutes before a meal    Historical Provider, MD  polyethylene glycol (MIRALAX / GLYCOLAX) packet Take 17 g by mouth daily as needed for mild constipation or moderate constipation.    Historical Provider, MD  potassium chloride SA (K-DUR,KLOR-CON) 20 MEQ tablet Take 20 mEq by mouth daily.    Historical Provider, MD  silver sulfADIAZINE (SILVADENE) 1 % cream Apply 1 application topically daily.      Historical Provider, MD  spironolactone (ALDACTONE) 25 MG tablet Take 50 mg by mouth daily.     Historical Provider, MD  sulfaSALAzine (AZULFIDINE) 500 MG tablet Take 2,000 mg by mouth 2 (two) times daily.     Historical Provider, MD  tamsulosin (FLOMAX) 0.4 MG CAPS capsule Take 0.4 mg by mouth at bedtime.     Historical Provider, MD   BP 167/100 mmHg  Pulse 66  Temp(Src) 97.9 F (36.6 C) (Oral)  Resp 18  SpO2 97% Physical Exam  Constitutional: He appears well-developed.  HENT:  Head: Atraumatic.  Eyes: EOM are normal.  Cardiovascular: Normal rate.   Pulmonary/Chest: Effort  normal.  Abdominal: He exhibits no distension.  Musculoskeletal:  Right groin mass, around 4 cm x 5 cm. Superiorly has a well-healing wound from the lymph node surgery. Masses overall a little fluctuant but firm. Mild tenderness. No erythema.  Skin: Skin is warm.    ED Course  Procedures (including critical care time) Labs Review Labs Reviewed - No data to display  Imaging Review No results found. I have personally reviewed and evaluated these images and lab results as part of my medical decision-making.   EKG Interpretation None      MDM   Final diagnoses:  Seroma    Patient with right groin mass, believe it is likely seroma from surgery. Bedside ultrasound placed on it and there was no flow using color Doppler. [facility]    Benjiman Core, MD 07/22/15 [phone]

## 2015-07-22 NOTE — ED Notes (Signed)
Bed: WA09 Expected date:  Expected time:  Means of arrival:  Comments: Triage 

## 2015-07-26 ENCOUNTER — Other Ambulatory Visit: Payer: Self-pay

## 2015-07-26 ENCOUNTER — Emergency Department (HOSPITAL_COMMUNITY): Payer: Medicare Other

## 2015-07-26 ENCOUNTER — Emergency Department (HOSPITAL_COMMUNITY)
Admission: EM | Admit: 2015-07-26 | Discharge: 2015-07-26 | Disposition: A | Discharge status: Home / Self Care | Payer: Medicare Other | Attending: Emergency Medicine | Admitting: Emergency Medicine

## 2015-07-26 DIAGNOSIS — R7989 Other specified abnormal findings of blood chemistry: Secondary | ICD-10-CM | POA: Diagnosis not present

## 2015-07-26 DIAGNOSIS — I509 Heart failure, unspecified: Secondary | ICD-10-CM | POA: Diagnosis not present

## 2015-07-26 DIAGNOSIS — E119 Type 2 diabetes mellitus without complications: Secondary | ICD-10-CM | POA: Diagnosis not present

## 2015-07-26 DIAGNOSIS — I10 Essential (primary) hypertension: Secondary | ICD-10-CM

## 2015-07-26 DIAGNOSIS — Z7982 Long term (current) use of aspirin: Secondary | ICD-10-CM | POA: Insufficient documentation

## 2015-07-26 DIAGNOSIS — I110 Hypertensive heart disease with heart failure: Secondary | ICD-10-CM | POA: Insufficient documentation

## 2015-07-26 DIAGNOSIS — Z8673 Personal history of transient ischemic attack (TIA), and cerebral infarction without residual deficits: Secondary | ICD-10-CM | POA: Insufficient documentation

## 2015-07-26 DIAGNOSIS — Z794 Long term (current) use of insulin: Secondary | ICD-10-CM | POA: Diagnosis not present

## 2015-07-26 DIAGNOSIS — Z79899 Other long term (current) drug therapy: Secondary | ICD-10-CM | POA: Diagnosis not present

## 2015-07-26 LAB — CBC WITH DIFFERENTIAL/PLATELET
BASOS PCT: 0 %
Basophils Absolute: 0 10*3/uL (ref 0.0–0.1)
EOS ABS: 0.1 10*3/uL (ref 0.0–0.7)
EOS PCT: 1 %
HCT: 29.4 % — ABNORMAL LOW (ref 39.0–52.0)
HEMOGLOBIN: 9.8 g/dL — AB (ref 13.0–17.0)
LYMPHS ABS: 1.1 10*3/uL (ref 0.7–4.0)
Lymphocytes Relative: 17 %
MCH: 29.7 pg (ref 26.0–34.0)
MCHC: 33.3 g/dL (ref 30.0–36.0)
MCV: 89.1 fL (ref 78.0–100.0)
MONO ABS: 0.4 10*3/uL (ref 0.1–1.0)
MONOS PCT: 6 %
NEUTROS PCT: 76 %
Neutro Abs: 4.8 10*3/uL (ref 1.7–7.7)
PLATELETS: 192 10*3/uL (ref 150–400)
RBC: 3.3 MIL/uL — ABNORMAL LOW (ref 4.22–5.81)
RDW: 14.5 % (ref 11.5–15.5)
WBC: 6.2 10*3/uL (ref 4.0–10.5)

## 2015-07-26 LAB — BASIC METABOLIC PANEL
Anion gap: 8 (ref 5–15)
BUN: 51 mg/dL — AB (ref 6–20)
CALCIUM: 9.4 mg/dL (ref 8.9–10.3)
CO2: 30 mmol/L (ref 22–32)
CREATININE: 1.72 mg/dL — AB (ref 0.61–1.24)
Chloride: 102 mmol/L (ref 101–111)
GFR calc non Af Amer: 43 mL/min — ABNORMAL LOW (ref >60)
GFR, EST AFRICAN AMERICAN: 50 mL/min — AB (ref >60)
Glucose, Bld: 114 mg/dL — ABNORMAL HIGH (ref 65–99)
Potassium: 3.7 mmol/L (ref 3.5–5.1)
SODIUM: 140 mmol/L (ref 135–145)

## 2015-07-26 LAB — CBG MONITORING, ED: Glucose-Capillary: 118 mg/dL — ABNORMAL HIGH (ref 65–99)

## 2015-07-26 MED ORDER — SODIUM CHLORIDE 0.9 % IV BOLUS (SEPSIS)
1000.0000 mL | Freq: Once | INTRAVENOUS | Status: AC
  Administered 2015-07-26: 1000 mL via INTRAVENOUS

## 2015-07-26 NOTE — ED Notes (Signed)
Patient transported to X-ray 

## 2015-07-26 NOTE — ED Provider Notes (Signed)
CSN: [id number]     Arrival date & time 07/26/15  1143 History   First MD Initiated Contact with Patient 07/26/15 1241     Chief Complaint  Patient presents with  . Hypertension     (Consider location/radiation/quality/duration/timing/severity/associated sxs/prior Treatment) HPI  55 year old male sent from his assisted living facility for hypertension. They noted his blood pressure to be 212/118. Patient has a long-standing history of chronic hypertension. He states he's been taking all his medicines as instructed, and most recently took him this morning. When he woke up he was feeling chills and had one episode of vomiting. Currently no nausea. Denies headaches, blurry vision, dizziness, chest pain, shortness of breath, or abdominal or back pain. No current nausea. Has a mild cough that started this morning. 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  Past Medical History  Diagnosis Date  . BPH (benign prostatic hyperplasia)   . CHF (congestive heart failure) (HCC)   . Hypertension   . Diabetes mellitus without complication (HCC)   . Diabetic retinopathy associated with type 2 diabetes mellitus (HCC)   . OSA (obstructive sleep apnea)   . Lymphedema   . TIA (transient ischemic attack)    Past Surgical History  Procedure Laterality Date  . Bladder surgery    . Cardiac catheterization     Family History  Problem Relation Age of Onset  . Cancer Mother   . Cancer Father    Social History  Substance Use Topics  . Smoking status: Never Smoker   . Smokeless tobacco: Never Used  . Alcohol Use: No    Review of Systems  Constitutional: Positive for chills. Negative for fever.  Eyes: Negative for visual disturbance.  Respiratory: Positive for cough. Negative for shortness of breath.   Cardiovascular: Negative for chest pain.  Gastrointestinal: Positive for nausea and vomiting. Negative for abdominal pain.  Musculoskeletal: Negative for back  pain and neck pain.  Neurological: Negative for dizziness, weakness, numbness and headaches.  All other systems reviewed and are negative.     Allergies  Chicken allergy  Home Medications   Prior to Admission medications   Medication Sig Start Date End Date Taking? Authorizing Provider  acetaminophen (TYLENOL) 325 MG tablet Take 650 mg by mouth 3 (three) times daily as needed for mild pain, moderate pain or headache.    Yes Historical Provider, MD  amLODipine (NORVASC) 10 MG tablet Take 10 mg by mouth daily.   Yes Historical Provider, MD  aspirin 325 MG tablet Take 325 mg by mouth daily.   Yes Historical Provider, MD  atorvastatin (LIPITOR) 40 MG tablet Take 20 mg by mouth at bedtime.    Yes Historical Provider, MD  bumetanide (BUMEX) 2 MG tablet Take 2 mg by mouth 2 (two) times daily.   Yes Historical Provider, MD  Calcium Carbonate-Vitamin D (CALCIUM-VITAMIN D) 500-200 MG-UNIT tablet Take 1 tablet by mouth daily.    Yes Historical Provider, MD  carvedilol (COREG) 25 MG tablet Take 50 mg by mouth every evening.    Yes Historical Provider, MD  citalopram (CELEXA) 40 MG tablet Take 40 mg by mouth daily.   Yes Historical Provider, MD  docusate sodium (COLACE) 100 MG capsule Take 100 mg by mouth daily as needed for mild constipation.   Yes Historical Provider, MD  doxazosin (CARDURA) 8 MG tablet Take 4 mg by mouth at bedtime.    Yes Historical Provider, MD  ferrous sulfate 325 (65 FE) MG tablet Take  325 mg by mouth daily with breakfast.   Yes Historical Provider, MD  furosemide (LASIX) 40 MG tablet Take 1 tablet (40 mg total) by mouth every other day. 05/22/15  Yes Rhetta Mura, MD  hydrALAZINE (APRESOLINE) 100 MG tablet Take 100 mg by mouth 3 (three) times daily.   Yes Historical Provider, MD  hydrocerin (EUCERIN) CREA Apply 1 application topically daily.   Yes Historical Provider, MD  ibuprofen (ADVIL,MOTRIN) 800 MG tablet Take 800 mg by mouth 3 (three) times daily as needed for  headache or moderate pain (with food).   Yes Historical Provider, MD  insulin aspart (NOVOLOG) 100 UNIT/ML injection Inject 3 Units into the skin 3 (three) times daily before meals.    Yes Historical Provider, MD  insulin glargine (LANTUS) 100 UNIT/ML injection Inject 18 Units into the skin at bedtime.    Yes Historical Provider, MD  isosorbide mononitrate (IMDUR) 30 MG 24 hr tablet Take 30 mg by mouth daily.   Yes Historical Provider, MD  lisinopril (PRINIVIL,ZESTRIL) 10 MG tablet Take 10 mg by mouth daily.   Yes Historical Provider, MD  metolazone (ZAROXOLYN) 5 MG tablet Take 5 mg by mouth every 3 (three) days.   Yes Historical Provider, MD  Multiple Vitamin (MULTIVITAMIN WITH MINERALS) TABS tablet Take 1 tablet by mouth daily.   Yes Historical Provider, MD  ondansetron (ZOFRAN ODT) 4 MG disintegrating tablet Take 1 tablet (4 mg total) by mouth every 8 (eight) hours as needed for nausea or vomiting. 05/30/15  Yes Barrett Henle, PA-C  oxybutynin (DITROPAN) 5 MG tablet Take 5 mg by mouth 3 (three) times daily.   Yes Historical Provider, MD  pantoprazole (PROTONIX) 40 MG tablet Take 40 mg by mouth daily. Take on an empty stomach 30 minutes before a meal   Yes Historical Provider, MD  polyethylene glycol (MIRALAX / GLYCOLAX) packet Take 17 g by mouth daily as needed for mild constipation or moderate constipation.   Yes Historical Provider, MD  potassium chloride SA (K-DUR,KLOR-CON) 20 MEQ tablet Take 20 mEq by mouth daily.   Yes Historical Provider, MD  silver sulfADIAZINE (SILVADENE) 1 % cream Apply 1 application topically daily.    Yes Historical Provider, MD  spironolactone (ALDACTONE) 25 MG tablet Take 50 mg by mouth daily.    Yes Historical Provider, MD  sulfaSALAzine (AZULFIDINE) 500 MG tablet Take 2,000 mg by mouth 2 (two) times daily.    Yes Historical Provider, MD  tamsulosin (FLOMAX) 0.4 MG CAPS capsule Take 0.4 mg by mouth at bedtime.    Yes Historical Provider, MD  ipratropium  (ATROVENT) 0.02 % nebulizer solution Take 2.5 mLs (0.5 mg total) by nebulization every 2 (two) hours as needed for wheezing or shortness of breath. Patient not taking: Reported on 07/26/2015 05/22/15   Rhetta Mura, MD  lisinopril (PRINIVIL,ZESTRIL) 5 MG tablet Take 1 tablet (5 mg total) by mouth daily. Patient not taking: Reported on 07/19/2015 05/22/15   Rhetta Mura, MD  loperamide (IMODIUM) 2 MG capsule Take 1 capsule (2 mg total) by mouth 4 (four) times daily as needed for diarrhea or loose stools. Patient not taking: Reported on 07/26/2015 05/30/15   Satira Sark Nadeau, PA-C   BP 201/109 mmHg  Pulse 82  Temp(Src) 97.4 F (36.3 C) (Oral)  Resp 10  SpO2 97% Physical Exam  Constitutional: He is oriented to person, place, and time. He appears well-developed and well-nourished.  HENT:  Head: Normocephalic and atraumatic.  Right Ear: External ear normal.  Left Ear:  External ear normal.  Nose: Nose normal.  Eyes: Pupils are equal, round, and reactive to light. Right eye exhibits no discharge. Left eye exhibits no discharge.  Neck: Neck supple.  Cardiovascular: Normal rate, regular rhythm, normal heart sounds and intact distal pulses.   Pulmonary/Chest: Effort normal and breath sounds normal.  Abdominal: Soft. There is no tenderness.  Musculoskeletal: He exhibits no edema.  Neurological: He is alert and oriented to person, place, and time.  Mild right facial droop. RUE, RLE mildly weaker compared to left, which is 5/5. Stable and chronic per patient and chart  Skin: Skin is warm and dry.  Nursing note and vitals reviewed.   ED Course  Procedures (including critical care time) Labs Review Labs Reviewed  BASIC METABOLIC PANEL - Abnormal; Notable for the following:    Glucose, Bld 114 (*)    BUN 51 (*)    Creatinine, Ser 1.72 (*)    GFR calc non Af Amer 43 (*)    GFR calc Af Amer 50 (*)    All other components within normal limits  CBC WITH DIFFERENTIAL/PLATELET -  Abnormal; Notable for the following:    RBC 3.30 (*)    Hemoglobin 9.8 (*)    HCT 29.4 (*)    All other components within normal limits  CBG MONITORING, ED - Abnormal; Notable for the following:    Glucose-Capillary 118 (*)    All other components within normal limits    Imaging Review Dg Chest 2 View  07/26/2015  CLINICAL DATA:  Cough and chills. EXAM: CHEST  2 VIEW COMPARISON:  None FINDINGS: The heart size and mediastinal contours are within normal limits. Both lungs are clear. The visualized skeletal structures are unremarkable. IMPRESSION: No active cardiopulmonary disease. Electronically Signed   By: Signa Kell M.D.   On: 07/26/2015 13:26   I have personally reviewed and evaluated these images and lab results as part of my medical decision-making.   EKG Interpretation None      MDM   Final diagnoses:  Essential hypertension  Elevated serum creatinine    Patient appears to be asymptomatic from his hypertension. His blood pressure is coming down without acute intervention. Exam is unremarkable. Unclear why he had his one episode of vomiting and chills. Does not appear to have bacterial infection or pneumonia. No urinary symptoms. No signs of a stroke. Discussed the importance of following up with his PCP for better blood pressure control. He does have a mildly elevated creatinine from most recent testing, however a couple months back his creatinine was around this baseline. Discussed importance of follow-up as well as return precautions.    Pricilla Loveless, MD 07/26/15 [phone]

## 2015-07-26 NOTE — ED Notes (Signed)
Per EMS, patient told the staff at the assisted living facility he had chills and n/v. They took his vital signs and his blood pressure was elevated.  BP: 212/118 HR: 70  O2: 97%

## 2015-07-26 NOTE — ED Notes (Signed)
Bed: WA06 Expected date:  Expected time:  Means of arrival:  Comments: EMS 18 M - high bp - AOA

## 2015-07-27 ENCOUNTER — Encounter (HOSPITAL_COMMUNITY): Payer: Self-pay | Admitting: Emergency Medicine

## 2015-07-27 ENCOUNTER — Emergency Department (HOSPITAL_COMMUNITY)
Admission: EM | Admit: 2015-07-27 | Discharge: 2015-07-27 | Disposition: A | Discharge status: Home / Self Care | Payer: Medicare Other | Attending: Emergency Medicine | Admitting: Emergency Medicine

## 2015-07-27 ENCOUNTER — Other Ambulatory Visit: Payer: Self-pay

## 2015-07-27 DIAGNOSIS — I110 Hypertensive heart disease with heart failure: Secondary | ICD-10-CM | POA: Insufficient documentation

## 2015-07-27 DIAGNOSIS — Z7982 Long term (current) use of aspirin: Secondary | ICD-10-CM | POA: Insufficient documentation

## 2015-07-27 DIAGNOSIS — Z794 Long term (current) use of insulin: Secondary | ICD-10-CM | POA: Diagnosis not present

## 2015-07-27 DIAGNOSIS — Z79899 Other long term (current) drug therapy: Secondary | ICD-10-CM | POA: Diagnosis not present

## 2015-07-27 DIAGNOSIS — I509 Heart failure, unspecified: Secondary | ICD-10-CM | POA: Insufficient documentation

## 2015-07-27 DIAGNOSIS — R55 Syncope and collapse: Secondary | ICD-10-CM | POA: Insufficient documentation

## 2015-07-27 DIAGNOSIS — E119 Type 2 diabetes mellitus without complications: Secondary | ICD-10-CM | POA: Diagnosis not present

## 2015-07-27 LAB — COMPREHENSIVE METABOLIC PANEL
ALT: 32 U/L (ref 17–63)
AST: 52 U/L — AB (ref 15–41)
Albumin: 3.6 g/dL (ref 3.5–5.0)
Alkaline Phosphatase: 157 U/L — ABNORMAL HIGH (ref 38–126)
Anion gap: 6 (ref 5–15)
BILIRUBIN TOTAL: 0.6 mg/dL (ref 0.3–1.2)
BUN: 44 mg/dL — AB (ref 6–20)
CO2: 29 mmol/L (ref 22–32)
CREATININE: 1.69 mg/dL — AB (ref 0.61–1.24)
Calcium: 9.2 mg/dL (ref 8.9–10.3)
Chloride: 103 mmol/L (ref 101–111)
GFR calc Af Amer: 51 mL/min — ABNORMAL LOW (ref >60)
GFR, EST NON AFRICAN AMERICAN: 44 mL/min — AB (ref >60)
Glucose, Bld: 116 mg/dL — ABNORMAL HIGH (ref 65–99)
POTASSIUM: 4.2 mmol/L (ref 3.5–5.1)
Sodium: 138 mmol/L (ref 135–145)
Total Protein: 6.4 g/dL — ABNORMAL LOW (ref 6.5–8.1)

## 2015-07-27 LAB — I-STAT TROPONIN, ED: TROPONIN I, POC: 0.03 ng/mL (ref 0.00–0.08)

## 2015-07-27 LAB — CBG MONITORING, ED: GLUCOSE-CAPILLARY: 117 mg/dL — AB (ref 65–99)

## 2015-07-27 LAB — CBC WITH DIFFERENTIAL/PLATELET
BASOS ABS: 0 10*3/uL (ref 0.0–0.1)
Basophils Relative: 0 %
Eosinophils Absolute: 0 10*3/uL (ref 0.0–0.7)
Eosinophils Relative: 1 %
HEMATOCRIT: 27.7 % — AB (ref 39.0–52.0)
Hemoglobin: 9.3 g/dL — ABNORMAL LOW (ref 13.0–17.0)
LYMPHS ABS: 1 10*3/uL (ref 0.7–4.0)
LYMPHS PCT: 18 %
MCH: 30 pg (ref 26.0–34.0)
MCHC: 33.6 g/dL (ref 30.0–36.0)
MCV: 89.4 fL (ref 78.0–100.0)
MONO ABS: 0.4 10*3/uL (ref 0.1–1.0)
Monocytes Relative: 7 %
NEUTROS ABS: 4.1 10*3/uL (ref 1.7–7.7)
Neutrophils Relative %: 74 %
Platelets: 165 10*3/uL (ref 150–400)
RBC: 3.1 MIL/uL — ABNORMAL LOW (ref 4.22–5.81)
RDW: 14.3 % (ref 11.5–15.5)
WBC: 5.5 10*3/uL (ref 4.0–10.5)

## 2015-07-27 LAB — POC OCCULT BLOOD, ED: FECAL OCCULT BLD: NEGATIVE

## 2015-07-27 MED ORDER — SODIUM CHLORIDE 0.9 % IV BOLUS (SEPSIS)
1000.0000 mL | Freq: Once | INTRAVENOUS | Status: AC
  Administered 2015-07-27: 1000 mL via INTRAVENOUS

## 2015-07-27 NOTE — ED Notes (Signed)
Pt aware urine sample is needed. Urinal has been provided

## 2015-07-27 NOTE — ED Notes (Signed)
Per EMS pt from [facility] brought in for near syncope episode after pt had a bowel movement. Pt had a few episodes of vomiting before EMS arrived. Denies pain or dizziness at present time.

## 2015-07-27 NOTE — Discharge Instructions (Signed)
Your symptoms may be due to your blood pressure medications.  Please take your blood pressure medications at night for the next several days until you can follow up with your doctor for further care.    Near-Syncope Near-syncope (commonly known as near fainting) is sudden weakness, dizziness, or feeling like you might pass out. During an episode of near-syncope, you may also develop pale skin, have tunnel vision, or feel sick to your stomach (nauseous). Near-syncope may occur when getting up after sitting or while standing for a long time. It is caused by a sudden decrease in blood flow to the brain. This decrease can result from various causes or triggers, most of which are not serious. However, because near-syncope can sometimes be a sign of something serious, a medical evaluation is required. The specific cause is often not determined. HOME CARE INSTRUCTIONS  Monitor your condition for any changes. The following actions may help to alleviate any discomfort you are experiencing:  Have someone stay with you until you feel stable.  Lie down right away and prop your feet up if you start feeling like you might faint. Breathe deeply and steadily. Wait until all the symptoms have passed. Most of these episodes last only a few minutes. You may feel tired for several hours.   Drink enough fluids to keep your urine clear or pale yellow.   If you are taking blood pressure or heart medicine, get up slowly when seated or lying down. Take several minutes to sit and then stand. This can reduce dizziness.  Follow up with your health care provider as directed. SEEK IMMEDIATE MEDICAL CARE IF:   You have a severe headache.   You have unusual pain in the chest, abdomen, or back.   You are bleeding from the mouth or rectum, or you have black or tarry stool.   You have an irregular or very fast heartbeat.   You have repeated fainting or have seizure-like jerking during an episode.   You faint when  sitting or lying down.   You have confusion.   You have difficulty walking.   You have severe weakness.   You have vision problems.  MAKE SURE YOU:   Understand these instructions.  Will watch your condition.  Will get help right away if you are not doing well or get worse.   This information is not intended to replace advice given to you by your health care provider. Make sure you discuss any questions you have with your health care provider.   Document Released: 01/20/2005 Document Revised: 01/25/2013 Document Reviewed: 06/25/2012 Elsevier Interactive Patient Education [employer]

## 2015-07-27 NOTE — ED Notes (Signed)
Pt attempting to void at present time.

## 2015-07-27 NOTE — ED Provider Notes (Signed)
CSN: [id number]     Arrival date & time 07/27/15  1439 History   First MD Initiated Contact with Patient 07/27/15 1502     Chief Complaint  Patient presents with  . Near Syncope     (Consider location/radiation/quality/duration/timing/severity/associated sxs/prior Treatment) HPI   55 year old male with hx of CHF, IDDM, HTN, CVA who lives at an assisted living facility presents for evaluation of near syncope.  Pt report a few hrs ago he was using the bathroom to have a bowel movement.  When he stood up to leave he experienced lightheadedness/dizziness and near syncope.  He proceed to vomit a few episodes of NBNB content.  The staff were notified and pt sent here for further evaluation.  Pt denies having fever, headache, diplopia, neck pain, cp, sob, palpitation, abd pain, back pain, diarrhea, new focal numbness or weakness.  He denies any alcohol or drug abuse, no changes in medication.  He ate his breakfast and lunch today.  He is back to his baseline. Pt has 2 prior strokes with residual R side weakness, using a walker to ambulate.  Pt was seen in the ER yesterday due to having high blood pressure.  He was discharged in good condition.  Pt did have one episode of vomiting yesterday as well.    Past Medical History  Diagnosis Date  . BPH (benign prostatic hyperplasia)   . CHF (congestive heart failure) (HCC)   . Hypertension   . Diabetes mellitus without complication (HCC)   . Diabetic retinopathy associated with type 2 diabetes mellitus (HCC)   . OSA (obstructive sleep apnea)   . Lymphedema   . TIA (transient ischemic attack)    Past Surgical History  Procedure Laterality Date  . Bladder surgery    . Cardiac catheterization     Family History  Problem Relation Age of Onset  . Cancer Mother   . Cancer Father    Social History  Substance Use Topics  . Smoking status: Never Smoker   . Smokeless tobacco: Never Used  . Alcohol Use: No    Review of Systems  All other systems  reviewed and are negative.     Allergies  Chicken allergy  Home Medications   Prior to Admission medications   Medication Sig Start Date End Date Taking? Authorizing Provider  acetaminophen (TYLENOL) 325 MG tablet Take 650 mg by mouth 3 (three) times daily as needed for mild pain, moderate pain or headache.     Historical Provider, MD  amLODipine (NORVASC) 10 MG tablet Take 10 mg by mouth daily.    Historical Provider, MD  aspirin 325 MG tablet Take 325 mg by mouth daily.    Historical Provider, MD  atorvastatin (LIPITOR) 40 MG tablet Take 20 mg by mouth at bedtime.     Historical Provider, MD  bumetanide (BUMEX) 2 MG tablet Take 2 mg by mouth 2 (two) times daily.    Historical Provider, MD  Calcium Carbonate-Vitamin D (CALCIUM-VITAMIN D) 500-200 MG-UNIT tablet Take 1 tablet by mouth daily.     Historical Provider, MD  carvedilol (COREG) 25 MG tablet Take 50 mg by mouth every evening.     Historical Provider, MD  citalopram (CELEXA) 40 MG tablet Take 40 mg by mouth daily.    Historical Provider, MD  docusate sodium (COLACE) 100 MG capsule Take 100 mg by mouth daily as needed for mild constipation.    Historical Provider, MD  doxazosin (CARDURA) 8 MG tablet Take 4 mg by mouth  at bedtime.     Historical Provider, MD  ferrous sulfate 325 (65 FE) MG tablet Take 325 mg by mouth daily with breakfast.    Historical Provider, MD  furosemide (LASIX) 40 MG tablet Take 1 tablet (40 mg total) by mouth every other day. 05/22/15   Rhetta Mura, MD  hydrALAZINE (APRESOLINE) 100 MG tablet Take 100 mg by mouth 3 (three) times daily.    Historical Provider, MD  hydrocerin (EUCERIN) CREA Apply 1 application topically daily.    Historical Provider, MD  ibuprofen (ADVIL,MOTRIN) 800 MG tablet Take 800 mg by mouth 3 (three) times daily as needed for headache or moderate pain (with food).    Historical Provider, MD  insulin aspart (NOVOLOG) 100 UNIT/ML injection Inject 3 Units into the skin 3 (three) times  daily before meals.     Historical Provider, MD  insulin glargine (LANTUS) 100 UNIT/ML injection Inject 18 Units into the skin at bedtime.     Historical Provider, MD  ipratropium (ATROVENT) 0.02 % nebulizer solution Take 2.5 mLs (0.5 mg total) by nebulization every 2 (two) hours as needed for wheezing or shortness of breath. Patient not taking: Reported on 07/26/2015 05/22/15   Rhetta Mura, MD  isosorbide mononitrate (IMDUR) 30 MG 24 hr tablet Take 30 mg by mouth daily.    Historical Provider, MD  lisinopril (PRINIVIL,ZESTRIL) 10 MG tablet Take 10 mg by mouth daily.    Historical Provider, MD  lisinopril (PRINIVIL,ZESTRIL) 5 MG tablet Take 1 tablet (5 mg total) by mouth daily. Patient not taking: Reported on 07/19/2015 05/22/15   Rhetta Mura, MD  loperamide (IMODIUM) 2 MG capsule Take 1 capsule (2 mg total) by mouth 4 (four) times daily as needed for diarrhea or loose stools. Patient not taking: Reported on 07/26/2015 05/30/15   Barrett Henle, PA-C  metolazone (ZAROXOLYN) 5 MG tablet Take 5 mg by mouth every 3 (three) days.    Historical Provider, MD  Multiple Vitamin (MULTIVITAMIN WITH MINERALS) TABS tablet Take 1 tablet by mouth daily.    Historical Provider, MD  ondansetron (ZOFRAN ODT) 4 MG disintegrating tablet Take 1 tablet (4 mg total) by mouth every 8 (eight) hours as needed for nausea or vomiting. 05/30/15   Barrett Henle, PA-C  oxybutynin (DITROPAN) 5 MG tablet Take 5 mg by mouth 3 (three) times daily.    Historical Provider, MD  pantoprazole (PROTONIX) 40 MG tablet Take 40 mg by mouth daily. Take on an empty stomach 30 minutes before a meal    Historical Provider, MD  polyethylene glycol (MIRALAX / GLYCOLAX) packet Take 17 g by mouth daily as needed for mild constipation or moderate constipation.    Historical Provider, MD  potassium chloride SA (K-DUR,KLOR-CON) 20 MEQ tablet Take 20 mEq by mouth daily.    Historical Provider, MD  silver sulfADIAZINE  (SILVADENE) 1 % cream Apply 1 application topically daily.     Historical Provider, MD  spironolactone (ALDACTONE) 25 MG tablet Take 50 mg by mouth daily.     Historical Provider, MD  sulfaSALAzine (AZULFIDINE) 500 MG tablet Take 2,000 mg by mouth 2 (two) times daily.     Historical Provider, MD  tamsulosin (FLOMAX) 0.4 MG CAPS capsule Take 0.4 mg by mouth at bedtime.     Historical Provider, MD   BP 143/77 mmHg  Pulse 62  Temp(Src) 98.2 F (36.8 C) (Oral)  Resp 14  SpO2 96% Physical Exam  Constitutional: He is oriented to person, place, and time. He appears well-developed  and well-nourished. No distress.  AAM resting in bed in NAD, nontoxic  HENT:  Head: Atraumatic.  Eyes: Conjunctivae and EOM are normal. Pupils are equal, round, and reactive to light.  Neck: Normal range of motion. Neck supple. No JVD present.  No nuchal rigidity  Cardiovascular: Normal rate and regular rhythm.   Pulmonary/Chest: Effort normal and breath sounds normal.  Abdominal: Soft. There is no tenderness.  Musculoskeletal: He exhibits edema (trace 1+ pitting edema BLE).  Neurological: He is alert and oriented to person, place, and time.  Mild R side facial droop.  Mild weakness to R side as compare to L side  Skin: No rash noted.  Psychiatric: He has a normal mood and affect.  Nursing note and vitals reviewed.   ED Course  Procedures (including critical care time) Labs Review Labs Reviewed  CBC WITH DIFFERENTIAL/PLATELET - Abnormal; Notable for the following:    RBC 3.10 (*)    Hemoglobin 9.3 (*)    HCT 27.7 (*)    All other components within normal limits  COMPREHENSIVE METABOLIC PANEL - Abnormal; Notable for the following:    Glucose, Bld 116 (*)    BUN 44 (*)    Creatinine, Ser 1.69 (*)    Total Protein 6.4 (*)    AST 52 (*)    Alkaline Phosphatase 157 (*)    GFR calc non Af Amer 44 (*)    GFR calc Af Amer 51 (*)    All other components within normal limits  CBG MONITORING, ED - Abnormal;  Notable for the following:    Glucose-Capillary 117 (*)    All other components within normal limits  I-STAT TROPOININ, ED  POC OCCULT BLOOD, ED    Imaging Review Dg Chest 2 View  07/26/2015  CLINICAL DATA:  Cough and chills. EXAM: CHEST  2 VIEW COMPARISON:  None FINDINGS: The heart size and mediastinal contours are within normal limits. Both lungs are clear. The visualized skeletal structures are unremarkable. IMPRESSION: No active cardiopulmonary disease. Electronically Signed   By: Signa Kell M.D.   On: 07/26/2015 13:26   I have personally reviewed and evaluated these images and lab results as part of my medical decision-making.   EKG Interpretation None     ED ECG REPORT   Date: 07/27/2015  Rate: 63  Rhythm: normal sinus rhythm  QRS Axis: normal  Intervals: normal  ST/T Wave abnormalities: nonspecific ST/T changes  Conduction Disutrbances:none  Narrative Interpretation:   Old EKG Reviewed: unchanged  I have personally reviewed the EKG tracing and agree with the computerized printout as noted.   MDM   Final diagnoses:  Vasovagal near syncope   BP 138/82 mmHg  Pulse 66  Temp(Src) 97.7 F (36.5 C) (Oral)  Resp 18  SpO2 100%   Pt here with a near syncopal episode after having a BM.  Suspect vasovagal.  Work up initiated.  Since pt report vomiting, will check orthostatic vital sign  4:14 PM Pt has positive orthostatic vital sign.  Hx of CHF, currently on Lasix.  Will give IVF.  Hgb today is 9.3     Orthostatic Vital Signs BA  Orthostatic Lying  - BP- Lying: 152/85 mmHg ; Pulse- Lying: 64  Orthostatic Sitting - BP- Sitting: 149/85 mmHg ; Pulse- Sitting: 66  Orthostatic Standing at 0 minutes - BP- Standing at 0 minutes: 114/66 mmHg ; Pulse- Standing at 0 minutes: 63     5:04 PM Pt is on multiple BP medications includes Norvasc, Coreg,  Cardura, Apresoline, zestril.  These medications may induce his near syncope.  I discussed with DR. Radford Pax, plan to have pt  takes his BP medication at night and to f/u with PCP for further management.    Fayrene Helper, PA-C 07/27/15 Paulo Fruit  Nelva Nay, MD 07/28/15 0900

## 2015-07-27 NOTE — ED Notes (Signed)
Tran at bedside 

## 2016-02-05 ENCOUNTER — Emergency Department (HOSPITAL_COMMUNITY): Payer: Medicare Other

## 2016-02-05 ENCOUNTER — Encounter (HOSPITAL_COMMUNITY): Payer: Self-pay | Admitting: Family Medicine

## 2016-02-05 ENCOUNTER — Inpatient Hospital Stay (HOSPITAL_COMMUNITY)
Admission: EM | Admit: 2016-02-05 | Discharge: 2016-02-11 | DRG: 040 | Disposition: A | Discharge status: Rehabilitation Facility | Payer: Medicare Other | Attending: Internal Medicine | Admitting: Internal Medicine

## 2016-02-05 DIAGNOSIS — G8194 Hemiplegia, unspecified affecting left nondominant side: Secondary | ICD-10-CM

## 2016-02-05 DIAGNOSIS — N179 Acute kidney failure, unspecified: Secondary | ICD-10-CM | POA: Diagnosis not present

## 2016-02-05 DIAGNOSIS — R609 Edema, unspecified: Secondary | ICD-10-CM | POA: Diagnosis not present

## 2016-02-05 DIAGNOSIS — I428 Other cardiomyopathies: Secondary | ICD-10-CM | POA: Diagnosis present

## 2016-02-05 DIAGNOSIS — I69391 Dysphagia following cerebral infarction: Secondary | ICD-10-CM

## 2016-02-05 DIAGNOSIS — I69319 Unspecified symptoms and signs involving cognitive functions following cerebral infarction: Secondary | ICD-10-CM | POA: Diagnosis not present

## 2016-02-05 DIAGNOSIS — D638 Anemia in other chronic diseases classified elsewhere: Secondary | ICD-10-CM | POA: Diagnosis present

## 2016-02-05 DIAGNOSIS — N183 Chronic kidney disease, stage 3 unspecified: Secondary | ICD-10-CM

## 2016-02-05 DIAGNOSIS — E876 Hypokalemia: Secondary | ICD-10-CM | POA: Diagnosis not present

## 2016-02-05 DIAGNOSIS — R4182 Altered mental status, unspecified: Secondary | ICD-10-CM

## 2016-02-05 DIAGNOSIS — K51919 Ulcerative colitis, unspecified with unspecified complications: Secondary | ICD-10-CM

## 2016-02-05 DIAGNOSIS — I639 Cerebral infarction, unspecified: Secondary | ICD-10-CM | POA: Diagnosis present

## 2016-02-05 DIAGNOSIS — R4701 Aphasia: Secondary | ICD-10-CM | POA: Diagnosis present

## 2016-02-05 DIAGNOSIS — F32A Depression, unspecified: Secondary | ICD-10-CM

## 2016-02-05 DIAGNOSIS — E785 Hyperlipidemia, unspecified: Secondary | ICD-10-CM | POA: Diagnosis present

## 2016-02-05 DIAGNOSIS — G9340 Encephalopathy, unspecified: Secondary | ICD-10-CM | POA: Diagnosis present

## 2016-02-05 DIAGNOSIS — E119 Type 2 diabetes mellitus without complications: Secondary | ICD-10-CM

## 2016-02-05 DIAGNOSIS — G8191 Hemiplegia, unspecified affecting right dominant side: Secondary | ICD-10-CM | POA: Diagnosis not present

## 2016-02-05 DIAGNOSIS — I509 Heart failure, unspecified: Secondary | ICD-10-CM

## 2016-02-05 DIAGNOSIS — Z79899 Other long term (current) drug therapy: Secondary | ICD-10-CM

## 2016-02-05 DIAGNOSIS — I5043 Acute on chronic combined systolic (congestive) and diastolic (congestive) heart failure: Secondary | ICD-10-CM | POA: Diagnosis not present

## 2016-02-05 DIAGNOSIS — I63512 Cerebral infarction due to unspecified occlusion or stenosis of left middle cerebral artery: Secondary | ICD-10-CM | POA: Diagnosis not present

## 2016-02-05 DIAGNOSIS — I69320 Aphasia following cerebral infarction: Secondary | ICD-10-CM | POA: Diagnosis not present

## 2016-02-05 DIAGNOSIS — I160 Hypertensive urgency: Secondary | ICD-10-CM | POA: Diagnosis present

## 2016-02-05 DIAGNOSIS — Z7982 Long term (current) use of aspirin: Secondary | ICD-10-CM | POA: Diagnosis not present

## 2016-02-05 DIAGNOSIS — N400 Enlarged prostate without lower urinary tract symptoms: Secondary | ICD-10-CM | POA: Diagnosis not present

## 2016-02-05 DIAGNOSIS — G4733 Obstructive sleep apnea (adult) (pediatric): Secondary | ICD-10-CM | POA: Diagnosis not present

## 2016-02-05 DIAGNOSIS — I472 Ventricular tachycardia: Secondary | ICD-10-CM | POA: Diagnosis not present

## 2016-02-05 DIAGNOSIS — I63312 Cerebral infarction due to thrombosis of left middle cerebral artery: Secondary | ICD-10-CM | POA: Diagnosis not present

## 2016-02-05 DIAGNOSIS — I5033 Acute on chronic diastolic (congestive) heart failure: Secondary | ICD-10-CM

## 2016-02-05 DIAGNOSIS — Z6833 Body mass index (BMI) 33.0-33.9, adult: Secondary | ICD-10-CM

## 2016-02-05 DIAGNOSIS — R29810 Facial weakness: Secondary | ICD-10-CM | POA: Diagnosis present

## 2016-02-05 DIAGNOSIS — F329 Major depressive disorder, single episode, unspecified: Secondary | ICD-10-CM

## 2016-02-05 DIAGNOSIS — Z794 Long term (current) use of insulin: Secondary | ICD-10-CM | POA: Diagnosis not present

## 2016-02-05 DIAGNOSIS — R627 Adult failure to thrive: Secondary | ICD-10-CM | POA: Diagnosis present

## 2016-02-05 DIAGNOSIS — K5190 Ulcerative colitis, unspecified, without complications: Secondary | ICD-10-CM | POA: Diagnosis not present

## 2016-02-05 DIAGNOSIS — R531 Weakness: Secondary | ICD-10-CM | POA: Diagnosis not present

## 2016-02-05 DIAGNOSIS — R1310 Dysphagia, unspecified: Secondary | ICD-10-CM | POA: Diagnosis not present

## 2016-02-05 DIAGNOSIS — I69351 Hemiplegia and hemiparesis following cerebral infarction affecting right dominant side: Secondary | ICD-10-CM | POA: Diagnosis present

## 2016-02-05 DIAGNOSIS — E8809 Other disorders of plasma-protein metabolism, not elsewhere classified: Secondary | ICD-10-CM | POA: Diagnosis not present

## 2016-02-05 DIAGNOSIS — Z8673 Personal history of transient ischemic attack (TIA), and cerebral infarction without residual deficits: Secondary | ICD-10-CM | POA: Diagnosis not present

## 2016-02-05 DIAGNOSIS — I5023 Acute on chronic systolic (congestive) heart failure: Secondary | ICD-10-CM | POA: Diagnosis not present

## 2016-02-05 DIAGNOSIS — I890 Lymphedema, not elsewhere classified: Secondary | ICD-10-CM | POA: Diagnosis not present

## 2016-02-05 DIAGNOSIS — E1142 Type 2 diabetes mellitus with diabetic polyneuropathy: Secondary | ICD-10-CM

## 2016-02-05 DIAGNOSIS — E11319 Type 2 diabetes mellitus with unspecified diabetic retinopathy without macular edema: Secondary | ICD-10-CM | POA: Diagnosis present

## 2016-02-05 DIAGNOSIS — I10 Essential (primary) hypertension: Secondary | ICD-10-CM | POA: Diagnosis not present

## 2016-02-05 DIAGNOSIS — I69390 Apraxia following cerebral infarction: Secondary | ICD-10-CM | POA: Diagnosis not present

## 2016-02-05 DIAGNOSIS — I63412 Cerebral infarction due to embolism of left middle cerebral artery: Secondary | ICD-10-CM | POA: Diagnosis not present

## 2016-02-05 DIAGNOSIS — I420 Dilated cardiomyopathy: Secondary | ICD-10-CM | POA: Diagnosis not present

## 2016-02-05 DIAGNOSIS — R262 Difficulty in walking, not elsewhere classified: Secondary | ICD-10-CM

## 2016-02-05 DIAGNOSIS — I130 Hypertensive heart and chronic kidney disease with heart failure and stage 1 through stage 4 chronic kidney disease, or unspecified chronic kidney disease: Secondary | ICD-10-CM | POA: Diagnosis present

## 2016-02-05 DIAGNOSIS — I6300 Cerebral infarction due to thrombosis of unspecified precerebral artery: Secondary | ICD-10-CM | POA: Diagnosis not present

## 2016-02-05 DIAGNOSIS — E669 Obesity, unspecified: Secondary | ICD-10-CM | POA: Diagnosis present

## 2016-02-05 DIAGNOSIS — I69322 Dysarthria following cerebral infarction: Secondary | ICD-10-CM | POA: Diagnosis not present

## 2016-02-05 LAB — URINALYSIS, ROUTINE W REFLEX MICROSCOPIC
BACTERIA UA: NONE SEEN
Bilirubin Urine: NEGATIVE
GLUCOSE, UA: NEGATIVE mg/dL
Ketones, ur: NEGATIVE mg/dL
Leukocytes, UA: NEGATIVE
Nitrite: NEGATIVE
Specific Gravity, Urine: 1.015 (ref 1.005–1.030)
Squamous Epithelial / LPF: NONE SEEN
pH: 5 (ref 5.0–8.0)

## 2016-02-05 LAB — I-STAT CHEM 8, ED
BUN: 33 mg/dL — ABNORMAL HIGH (ref 6–20)
CALCIUM ION: 1.16 mmol/L (ref 1.15–1.40)
CREATININE: 1.5 mg/dL — AB (ref 0.61–1.24)
Chloride: 107 mmol/L (ref 101–111)
GLUCOSE: 104 mg/dL — AB (ref 65–99)
HCT: 37 % — ABNORMAL LOW (ref 39.0–52.0)
HEMOGLOBIN: 12.6 g/dL — AB (ref 13.0–17.0)
Potassium: 3.6 mmol/L (ref 3.5–5.1)
Sodium: 143 mmol/L (ref 135–145)
TCO2: 28 mmol/L (ref 0–100)

## 2016-02-05 LAB — APTT: APTT: 37 s — AB (ref 24–36)

## 2016-02-05 LAB — I-STAT ARTERIAL BLOOD GAS, ED
Acid-Base Excess: 1 mmol/L (ref 0.0–2.0)
Bicarbonate: 26.5 mmol/L (ref 20.0–28.0)
O2 Saturation: 99 %
TCO2: 28 mmol/L (ref 0–100)
pCO2 arterial: 43.4 mmHg (ref 32.0–48.0)
pH, Arterial: 7.393 (ref 7.350–7.450)
pO2, Arterial: 149 mmHg — ABNORMAL HIGH (ref 83.0–108.0)

## 2016-02-05 LAB — CBC
HEMATOCRIT: 37 % — AB (ref 39.0–52.0)
Hemoglobin: 12.1 g/dL — ABNORMAL LOW (ref 13.0–17.0)
MCH: 28.7 pg (ref 26.0–34.0)
MCHC: 32.7 g/dL (ref 30.0–36.0)
MCV: 87.7 fL (ref 78.0–100.0)
Platelets: 230 10*3/uL (ref 150–400)
RBC: 4.22 MIL/uL (ref 4.22–5.81)
RDW: 16.1 % — AB (ref 11.5–15.5)
WBC: 6.3 10*3/uL (ref 4.0–10.5)

## 2016-02-05 LAB — COMPREHENSIVE METABOLIC PANEL
ALT: 27 U/L (ref 17–63)
AST: 42 U/L — AB (ref 15–41)
Albumin: 2.8 g/dL — ABNORMAL LOW (ref 3.5–5.0)
Alkaline Phosphatase: 227 U/L — ABNORMAL HIGH (ref 38–126)
Anion gap: 7 (ref 5–15)
BUN: 24 mg/dL — AB (ref 6–20)
CHLORIDE: 111 mmol/L (ref 101–111)
CO2: 25 mmol/L (ref 22–32)
CREATININE: 1.48 mg/dL — AB (ref 0.61–1.24)
Calcium: 9.1 mg/dL (ref 8.9–10.3)
GFR calc Af Amer: >60 mL/min (ref >60)
GFR calc non Af Amer: 52 mL/min — ABNORMAL LOW (ref >60)
Glucose, Bld: 105 mg/dL — ABNORMAL HIGH (ref 65–99)
Potassium: 3.3 mmol/L — ABNORMAL LOW (ref 3.5–5.1)
SODIUM: 143 mmol/L (ref 135–145)
Total Bilirubin: 0.9 mg/dL (ref 0.3–1.2)
Total Protein: 6 g/dL — ABNORMAL LOW (ref 6.5–8.1)

## 2016-02-05 LAB — DIFFERENTIAL
BASOS ABS: 0 10*3/uL (ref 0.0–0.1)
BASOS PCT: 0 %
Eosinophils Absolute: 0.1 10*3/uL (ref 0.0–0.7)
Eosinophils Relative: 1 %
LYMPHS PCT: 16 %
Lymphs Abs: 1 10*3/uL (ref 0.7–4.0)
MONOS PCT: 4 %
Monocytes Absolute: 0.3 10*3/uL (ref 0.1–1.0)
NEUTROS ABS: 5 10*3/uL (ref 1.7–7.7)
Neutrophils Relative %: 79 %

## 2016-02-05 LAB — GLUCOSE, CAPILLARY: Glucose-Capillary: 96 mg/dL (ref 65–99)

## 2016-02-05 LAB — I-STAT CG4 LACTIC ACID, ED: Lactic Acid, Venous: 1.8 mmol/L (ref 0.5–1.9)

## 2016-02-05 LAB — BRAIN NATRIURETIC PEPTIDE: B Natriuretic Peptide: 3370.8 pg/mL — ABNORMAL HIGH (ref 0.0–100.0)

## 2016-02-05 LAB — I-STAT TROPONIN, ED: Troponin i, poc: 0.04 ng/mL (ref 0.00–0.08)

## 2016-02-05 LAB — PROTIME-INR
INR: 1.18
Prothrombin Time: 15.1 seconds (ref 11.4–15.2)

## 2016-02-05 MED ORDER — LABETALOL HCL 5 MG/ML IV SOLN
10.0000 mg | INTRAVENOUS | Status: DC | PRN
  Administered 2016-02-05 – 2016-02-09 (×6): 10 mg via INTRAVENOUS
  Filled 2016-02-05 (×5): qty 4

## 2016-02-05 MED ORDER — STROKE: EARLY STAGES OF RECOVERY BOOK
Freq: Once | Status: AC
  Administered 2016-02-06: 01:00:00
  Filled 2016-02-05: qty 1

## 2016-02-05 MED ORDER — ATORVASTATIN CALCIUM 10 MG PO TABS
20.0000 mg | ORAL_TABLET | Freq: Every day | ORAL | Status: DC
  Administered 2016-02-06 – 2016-02-10 (×5): 20 mg via ORAL
  Filled 2016-02-05 (×5): qty 2

## 2016-02-05 MED ORDER — ADULT MULTIVITAMIN W/MINERALS CH
1.0000 | ORAL_TABLET | Freq: Every day | ORAL | Status: DC
  Administered 2016-02-06 – 2016-02-11 (×4): 1 via ORAL
  Filled 2016-02-05 (×6): qty 1

## 2016-02-05 MED ORDER — SODIUM CHLORIDE 0.9 % IV SOLN: 250.0000 mL | INTRAVENOUS | Status: DC | PRN

## 2016-02-05 MED ORDER — IOPAMIDOL (ISOVUE-370) INJECTION 76%
INTRAVENOUS | Status: AC
  Filled 2016-02-05: qty 100

## 2016-02-05 MED ORDER — SODIUM CHLORIDE 0.9 % IV SOLN
30.0000 meq | Freq: Once | INTRAVENOUS | Status: AC
  Administered 2016-02-06: 30 meq via INTRAVENOUS
  Filled 2016-02-05: qty 15

## 2016-02-05 MED ORDER — SODIUM CHLORIDE 0.9% FLUSH: 3.0000 mL | INTRAVENOUS | Status: DC | PRN

## 2016-02-05 MED ORDER — ACETAMINOPHEN 325 MG PO TABS: 650.0000 mg | ORAL_TABLET | ORAL | Status: DC | PRN

## 2016-02-05 MED ORDER — FUROSEMIDE 10 MG/ML IJ SOLN
80.0000 mg | Freq: Once | INTRAMUSCULAR | Status: AC
  Administered 2016-02-05: 80 mg via INTRAVENOUS
  Filled 2016-02-05: qty 8

## 2016-02-05 MED ORDER — LABETALOL HCL 5 MG/ML IV SOLN
20.0000 mg | Freq: Once | INTRAVENOUS | Status: AC
  Administered 2016-02-05: 20 mg via INTRAVENOUS
  Filled 2016-02-05: qty 4

## 2016-02-05 MED ORDER — SODIUM CHLORIDE 0.9% FLUSH
3.0000 mL | Freq: Two times a day (BID) | INTRAVENOUS | Status: DC
  Administered 2016-02-06 – 2016-02-11 (×10): 3 mL via INTRAVENOUS

## 2016-02-05 MED ORDER — SULFASALAZINE 500 MG PO TABS
2000.0000 mg | ORAL_TABLET | Freq: Two times a day (BID) | ORAL | Status: DC
  Administered 2016-02-06 – 2016-02-11 (×9): 2000 mg via ORAL
  Filled 2016-02-05 (×11): qty 4

## 2016-02-05 MED ORDER — TAMSULOSIN HCL 0.4 MG PO CAPS
0.4000 mg | ORAL_CAPSULE | Freq: Every day | ORAL | Status: DC
  Administered 2016-02-06 – 2016-02-10 (×5): 0.4 mg via ORAL
  Filled 2016-02-05 (×5): qty 1

## 2016-02-05 MED ORDER — DOCUSATE SODIUM 100 MG PO CAPS
100.0000 mg | ORAL_CAPSULE | Freq: Every day | ORAL | Status: DC | PRN
  Filled 2016-02-05: qty 1

## 2016-02-05 MED ORDER — INSULIN ASPART 100 UNIT/ML ~~LOC~~ SOLN
0.0000 [IU] | SUBCUTANEOUS | Status: DC
  Administered 2016-02-07 (×2): 1 [IU] via SUBCUTANEOUS

## 2016-02-05 MED ORDER — FUROSEMIDE 10 MG/ML IJ SOLN: 60.0000 mg | Freq: Two times a day (BID) | INTRAMUSCULAR | Status: DC

## 2016-02-05 MED ORDER — CITALOPRAM HYDROBROMIDE 40 MG PO TABS
40.0000 mg | ORAL_TABLET | Freq: Every day | ORAL | Status: DC
  Administered 2016-02-06 – 2016-02-11 (×4): 40 mg via ORAL
  Filled 2016-02-05 (×6): qty 1

## 2016-02-05 MED ORDER — CALCIUM CARBONATE-VITAMIN D 500-200 MG-UNIT PO TABS
1.0000 | ORAL_TABLET | Freq: Every day | ORAL | Status: DC
  Administered 2016-02-06 – 2016-02-11 (×4): 1 via ORAL
  Filled 2016-02-05 (×6): qty 1

## 2016-02-05 MED ORDER — ENOXAPARIN SODIUM 40 MG/0.4ML ~~LOC~~ SOLN
40.0000 mg | SUBCUTANEOUS | Status: DC
  Administered 2016-02-06 – 2016-02-10 (×6): 40 mg via SUBCUTANEOUS
  Filled 2016-02-05 (×6): qty 0.4

## 2016-02-05 MED ORDER — ONDANSETRON HCL 4 MG/2ML IJ SOLN
4.0000 mg | Freq: Four times a day (QID) | INTRAMUSCULAR | Status: DC | PRN
  Administered 2016-02-06 – 2016-02-08 (×2): 4 mg via INTRAVENOUS
  Filled 2016-02-05 (×2): qty 2

## 2016-02-05 MED ORDER — NALOXONE HCL 0.4 MG/ML IJ SOLN
0.4000 mg | Freq: Once | INTRAMUSCULAR | Status: AC
  Administered 2016-02-05: 0.4 mg via INTRAVENOUS
  Filled 2016-02-05: qty 1

## 2016-02-05 MED ORDER — MAGNESIUM SULFATE IN D5W 1-5 GM/100ML-% IV SOLN
1.0000 g | Freq: Once | INTRAVENOUS | Status: AC
  Administered 2016-02-06: 1 g via INTRAVENOUS
  Filled 2016-02-05: qty 100

## 2016-02-05 MED ORDER — INSULIN GLARGINE 100 UNIT/ML ~~LOC~~ SOLN
16.0000 [IU] | Freq: Every day | SUBCUTANEOUS | Status: DC
  Administered 2016-02-06 (×2): 16 [IU] via SUBCUTANEOUS
  Filled 2016-02-05 (×2): qty 0.16

## 2016-02-05 MED ORDER — POLYETHYLENE GLYCOL 3350 17 G PO PACK
17.0000 g | PACK | Freq: Every day | ORAL | Status: DC | PRN
  Filled 2016-02-05: qty 1

## 2016-02-05 MED ORDER — ISOSORBIDE MONONITRATE ER 30 MG PO TB24
30.0000 mg | ORAL_TABLET | Freq: Every day | ORAL | Status: DC
  Administered 2016-02-06 – 2016-02-11 (×4): 30 mg via ORAL
  Filled 2016-02-05 (×6): qty 1

## 2016-02-05 MED ORDER — DOXAZOSIN MESYLATE 8 MG PO TABS
4.0000 mg | ORAL_TABLET | Freq: Every day | ORAL | Status: DC
  Administered 2016-02-06 – 2016-02-10 (×5): 4 mg via ORAL
  Filled 2016-02-05 (×5): qty 1

## 2016-02-05 NOTE — ED Provider Notes (Signed)
[facility]-EMERGENCY DEPT Provider Note   CSN: [id number] Arrival date & time: 02/05/16  1445     History   Chief Complaint Chief Complaint  Patient presents with  . Stroke Symptoms    HPI [name] is a 56 y.o. male.  The history is provided by the patient.  Altered Mental Status   This is a new problem. Episode onset: today sometime. The problem has not changed since onset.Associated symptoms include confusion and somnolence. His past medical history is significant for CVA. Past medical history comments: CHF, DM, HTN, OSA.    Past Medical History:  Diagnosis Date  . BPH (benign prostatic hyperplasia)   . CHF (congestive heart failure) (HCC)   . Diabetes mellitus without complication (HCC)   . Diabetic retinopathy associated with type 2 diabetes mellitus (HCC)   . Hypertension   . Lymphedema   . OSA (obstructive sleep apnea)   . TIA (transient ischemic attack)     Patient Active Problem List   Diagnosis Date Noted  . Anemia of chronic disease 05/21/2015  . DM (diabetes mellitus) type 2, uncontrolled, with ketoacidosis (HCC) 05/21/2015  . OSA ? compliacne 05/21/2015  . Homelessness 05/21/2015  . Weakness   . AKI (acute kidney injury) (HCC) 05/20/2015  . Essential hypertension 05/20/2015  . CHF (congestive heart failure) (HCC) 05/20/2015  . Weakness of right lower extremity 05/20/2015    Past Surgical History:  Procedure Laterality Date  . BLADDER SURGERY    . CARDIAC CATHETERIZATION         Home Medications    Prior to Admission medications   Medication Sig Start Date End Date Taking? Authorizing Provider  acetaminophen (TYLENOL) 325 MG tablet Take 650 mg by mouth 3 (three) times daily as needed for mild pain, moderate pain or headache.     Historical Provider, MD  amLODipine (NORVASC) 10 MG tablet Take 10 mg by mouth daily.    Historical Provider, MD  aspirin 325 MG tablet Take 325 mg by mouth daily.    Historical Provider, MD  atorvastatin (LIPITOR) 40  MG tablet Take 20 mg by mouth at bedtime.     Historical Provider, MD  bumetanide (BUMEX) 2 MG tablet Take 2 mg by mouth 2 (two) times daily.    Historical Provider, MD  Calcium Carbonate-Vitamin D (CALCIUM-VITAMIN D) 500-200 MG-UNIT tablet Take 1 tablet by mouth daily.     Historical Provider, MD  carvedilol (COREG) 25 MG tablet Take 50 mg by mouth every evening.     Historical Provider, MD  citalopram (CELEXA) 40 MG tablet Take 40 mg by mouth daily.    Historical Provider, MD  docusate sodium (COLACE) 100 MG capsule Take 100 mg by mouth daily as needed for mild constipation.    Historical Provider, MD  doxazosin (CARDURA) 8 MG tablet Take 4 mg by mouth at bedtime.     Historical Provider, MD  ferrous sulfate 325 (65 FE) MG tablet Take 325 mg by mouth daily with breakfast.    Historical Provider, MD  furosemide (LASIX) 40 MG tablet Take 1 tablet (40 mg total) by mouth every other day. 05/22/15   Rhetta Mura, MD  hydrALAZINE (APRESOLINE) 100 MG tablet Take 100 mg by mouth 3 (three) times daily.    Historical Provider, MD  hydrocerin (EUCERIN) CREA Apply 1 application topically daily.    Historical Provider, MD  ibuprofen (ADVIL,MOTRIN) 800 MG tablet Take 800 mg by mouth 3 (three) times daily as needed for headache or moderate pain (with  food).    Historical Provider, MD  insulin aspart (NOVOLOG) 100 UNIT/ML injection Inject 3 Units into the skin 3 (three) times daily before meals.     Historical Provider, MD  insulin glargine (LANTUS) 100 UNIT/ML injection Inject 18 Units into the skin at bedtime.     Historical Provider, MD  ipratropium (ATROVENT) 0.02 % nebulizer solution Take 2.5 mLs (0.5 mg total) by nebulization every 2 (two) hours as needed for wheezing or shortness of breath. Patient not taking: Reported on 07/26/2015 05/22/15   Rhetta Mura, MD  isosorbide mononitrate (IMDUR) 30 MG 24 hr tablet Take 30 mg by mouth daily.    Historical Provider, MD  lisinopril (PRINIVIL,ZESTRIL)  10 MG tablet Take 10 mg by mouth daily.    Historical Provider, MD  lisinopril (PRINIVIL,ZESTRIL) 5 MG tablet Take 1 tablet (5 mg total) by mouth daily. Patient not taking: Reported on 07/19/2015 05/22/15   Rhetta Mura, MD  loperamide (IMODIUM) 2 MG capsule Take 1 capsule (2 mg total) by mouth 4 (four) times daily as needed for diarrhea or loose stools. Patient not taking: Reported on 07/26/2015 05/30/15   Barrett Henle, PA-C  metolazone (ZAROXOLYN) 5 MG tablet Take 5 mg by mouth every 3 (three) days.    Historical Provider, MD  Multiple Vitamin (MULTIVITAMIN WITH MINERALS) TABS tablet Take 1 tablet by mouth daily.    Historical Provider, MD  ondansetron (ZOFRAN ODT) 4 MG disintegrating tablet Take 1 tablet (4 mg total) by mouth every 8 (eight) hours as needed for nausea or vomiting. 05/30/15   Barrett Henle, PA-C  oxybutynin (DITROPAN) 5 MG tablet Take 5 mg by mouth 3 (three) times daily.    Historical Provider, MD  pantoprazole (PROTONIX) 40 MG tablet Take 40 mg by mouth daily. Take on an empty stomach 30 minutes before a meal    Historical Provider, MD  polyethylene glycol (MIRALAX / GLYCOLAX) packet Take 17 g by mouth daily as needed for mild constipation or moderate constipation.    Historical Provider, MD  potassium chloride SA (K-DUR,KLOR-CON) 20 MEQ tablet Take 20 mEq by mouth daily.    Historical Provider, MD  silver sulfADIAZINE (SILVADENE) 1 % cream Apply 1 application topically daily as needed (skin care).     Historical Provider, MD  spironolactone (ALDACTONE) 25 MG tablet Take 25 mg by mouth 2 (two) times daily.     Historical Provider, MD  sulfaSALAzine (AZULFIDINE) 500 MG tablet Take 2,000 mg by mouth 2 (two) times daily.     Historical Provider, MD  tamsulosin (FLOMAX) 0.4 MG CAPS capsule Take 0.4 mg by mouth at bedtime.     Historical Provider, MD    Family History Family History  Problem Relation Age of Onset  . Cancer Mother   . Cancer Father      Social History Social History  Substance Use Topics  . Smoking status: Never Smoker  . Smokeless tobacco: Never Used  . Alcohol use No     Allergies   Chicken allergy   Review of Systems Review of Systems  Unable to perform ROS: Mental status change  Psychiatric/Behavioral: Positive for confusion.  LEVEL 5 EXCEPTION TO HISTORY   Physical Exam Updated Vital Signs BP (!) 200/137 (BP Location: Right Arm)   Pulse 96   Temp 98 F (36.7 C) (Oral)   Resp 19   SpO2 95%   Physical Exam  Constitutional: He appears well-developed and well-nourished. He appears lethargic. No distress.  HENT:  Head: Normocephalic  and atraumatic.  Nose: Nose normal.  Eyes: Conjunctivae are normal.  Neck: Neck supple. No tracheal deviation present.  Cardiovascular: Normal rate, regular rhythm and normal heart sounds.   Pulmonary/Chest: Effort normal. No respiratory distress. He has no wheezes. He has rales (bibasilar).  Heavy breathing with   Abdominal: Soft. He exhibits no distension. There is no tenderness. There is no guarding.  Neurological: He appears lethargic. He is disoriented. GCS eye subscore is 3. GCS verbal subscore is 3. GCS motor subscore is 6.  Right-sided facial droop and mild asymmetry  Skin: Skin is warm and dry.  Psychiatric: His affect is blunt. He is slowed and withdrawn. He is inattentive.  Vitals reviewed.    ED Treatments / Results  Labs (all labs ordered are listed, but only abnormal results are displayed) Labs Reviewed  APTT - Abnormal; Notable for the following:       Result Value   aPTT 37 (*)    All other components within normal limits  CBC - Abnormal; Notable for the following:    Hemoglobin 12.1 (*)    HCT 37.0 (*)    RDW 16.1 (*)    All other components within normal limits  COMPREHENSIVE METABOLIC PANEL - Abnormal; Notable for the following:    Potassium 3.3 (*)    Glucose, Bld 105 (*)    BUN 24 (*)    Creatinine, Ser 1.48 (*)    Total Protein  6.0 (*)    Albumin 2.8 (*)    AST 42 (*)    Alkaline Phosphatase 227 (*)    GFR calc non Af Amer 52 (*)    All other components within normal limits  BRAIN NATRIURETIC PEPTIDE - Abnormal; Notable for the following:    B Natriuretic Peptide 3,370.8 (*)    All other components within normal limits  URINALYSIS, ROUTINE W REFLEX MICROSCOPIC - Abnormal; Notable for the following:    Hgb urine dipstick SMALL (*)    Protein, ur >=300 (*)    All other components within normal limits  I-STAT CHEM 8, ED - Abnormal; Notable for the following:    BUN 33 (*)    Creatinine, Ser 1.50 (*)    Glucose, Bld 104 (*)    Hemoglobin 12.6 (*)    HCT 37.0 (*)    All other components within normal limits  I-STAT ARTERIAL BLOOD GAS, ED - Abnormal; Notable for the following:    pO2, Arterial 149.0 (*)    All other components within normal limits  URINE CULTURE  CULTURE, BLOOD (ROUTINE X 2)  CULTURE, BLOOD (ROUTINE X 2)  PROTIME-INR  DIFFERENTIAL  I-STAT TROPOININ, ED  CBG MONITORING, ED  I-STAT CG4 LACTIC ACID, ED    EKG  EKG Interpretation  Date/Time:  Tuesday February 05 2016 14:57:23 EST Ventricular Rate:  94 PR Interval:    QRS Duration: 94 QT Interval:  378 QTC Calculation: 473 R Axis:   -47 Text Interpretation:  Sinus rhythm Left anterior fascicular block Abnormal R-wave progression, late transition LVH with secondary repolarization abnormality Nonspecific T wave abnormality, worse in Lateral leads Nonspecific T wave abnormality, improved in Inferior leads Baseline wander in lead(s) II III aVF Since last tracing rate faster Confirmed by Taeshawn Helfman MD, Reuel Boom ([id number]) on 02/05/2016 3:39:43 PM       Radiology Ct Angio Head W Or Wo Contrast  Result Date: 02/05/2016 CLINICAL DATA:  Initial evaluation for acute right-sided weakness, slurred speech, aphasia. EXAM: CT ANGIOGRAPHY HEAD AND NECK TECHNIQUE: Multidetector CT  imaging of the head and neck was performed using the standard protocol during bolus  administration of intravenous contrast. Multiplanar CT image reconstructions and MIPs were obtained to evaluate the vascular anatomy. Carotid stenosis measurements (when applicable) are obtained utilizing NASCET criteria, using the distal internal carotid diameter as the denominator. CONTRAST:  90 cc of Isovue 370. COMPARISON:  Prior CT from earlier the same day. FINDINGS: CT HEAD FINDINGS Brain: Atrophy with chronic microvascular ischemic disease again noted. Remote lacunar infarcts within the bilateral basal ganglia. Now evident is subtle evolving hypodensity involving the left insular cortex extending towards the left frontal operculum (series 201, image 18). This is more evident as compared to previous examination performed earlier the same day. No significant mass effect. No acute intracranial hemorrhage. Hyperdensity at the level of the left sylvian fissure suspicious for thrombus (series 201, image 19). No mass lesion, midline shift, or mass effect. No hydrocephalus. No extra-axial fluid collection. Vascular: Vascular calcifications within the carotid siphons. Hyperdensity at the level the left sylvian fissure suspicious for thrombus (series 201, image 19). No other hyperdense vessel. Skull: Scalp soft tissues within normal limits.  Calvarium intact. Sinuses: Globes and orbital soft tissues within normal limits. Patient is status post scleral banding on the left. Mild opacity within the right ethmoidal air cells. Paranasal sinuses are otherwise clear. No mastoid effusion. CTA NECK FINDINGS Aortic arch: Visualized aortic arch of normal caliber with normal branch pattern. Scattered atheromatous plaque within the arch itself and about the origin of the great vessels without flow-limiting stenosis. Visualized subclavian arteries are widely patent. Right carotid system: Right common carotid artery patent from its origin to the bifurcation. Scattered calcified plaque about the right bifurcation without  flow-limiting stenosis. Right ICA patent distally to the skullbase without stenosis, dissection, or occlusion. Left carotid system: Left common carotid artery patent from its origin to the bifurcation. Scattered calcified plaque about the left bifurcation without flow-limiting stenosis. Left ICA patent distally to the skullbase without stenosis, dissection, or occlusion. Vertebral arteries: Both of the vertebral arteries arise from the subclavian arteries. The the vertebral arteries widely patent without stenosis, dissection, or occlusion. Focal plaque noted at the origin of the right vertebral artery without high-grade stenosis. Skeleton: No acute osseous abnormality. No worrisome lytic or blastic osseous lesions. Other neck: Visualized soft tissues of the neck demonstrate no acute abnormality. Upper chest: Visualized upper mediastinum within normal limits. Layering right pleural effusion. Probable smaller left effusion. Scattered interlobular septal thickening within the partially visualized lungs, suggesting edema. Review of the MIP images confirms the above findings CTA HEAD FINDINGS Anterior circulation: Petrous segments patent bilaterally. Scattered calcified atheromatous plaque within the cavernous/ supraclinoid ICAs with mild to moderate multifocal narrowing. ICA termini patent. Left A1 segment dominant and widely patent. Right A1 segment hypoplastic but patent as well. Anterior communicating artery normal. Anterior cerebral arteries patent to their distal aspects. Right M1 segment patent without stenosis or occlusion. Right MCA bifurcation normal. Right MCA branches well opacified to their distal aspects. Left M1 segment patent without stenosis or occlusion. Left MCA bifurcation normal. No proximal M2 branch occlusion. There is abrupt occlusion of a left M3 branch, middle division (series 602, image 80). This corresponds with previously seen hyperdensity on noncontrast portion of this exam. No other  arterial branch occlusion. Left MCA branches otherwise patent. Posterior circulation: Scattered plaque within the V4 segments bilaterally, right greater than left with mild multifocal narrowing. Posterior inferior cerebral arteries patent bilaterally. Basilar artery widely patent. Superior cerebellar arteries  patent bilaterally. Both of the posterior cerebral arteries supplied mainly via the basilar artery and are patent to their distal aspects. Venous sinuses: Patent. Anatomic variants: No significant anatomic variant. No aneurysm or vascular malformation. Delayed phase: Not performed. Review of the MIP images confirms the above findings IMPRESSION: 1. 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 2. Scattered atheromatous plaque throughout the carotid siphons with mild to moderate multifocal narrowing. 3. Atheromatous plaque about the carotid bifurcations bilaterally without flow-limiting stenosis. 4. Large layering right pleural effusion, with smaller left pleural effusion. Interlobular septal thickening suggestive of pulmonary edema. Critical Value/emergent results were called by telephone at the time of interpretation on 02/05/2016 at 6:19 pm to Dr. Ritta Slot , who verbally acknowledged these results. Electronically Signed   By: Rise Mu M.D.   On: 02/05/2016 19:04   Ct Head Wo Contrast  Result Date: 02/05/2016 CLINICAL DATA:  Altered mental status, aphasia, right-sided weakness EXAM: CT HEAD WITHOUT CONTRAST TECHNIQUE: Contiguous axial images were obtained from the base of the skull through the vertex without intravenous contrast. COMPARISON:  MRI 05/19/2000 FINDINGS: Brain: Old lacunar infarct in the left basal ganglia. Old infarct and encephalomalacia in the right occipital lobe. No acute intracranial abnormality. Specifically, no hemorrhage, hydrocephalus, mass lesion, acute infarction, or significant intracranial  injury. Vascular: No hyperdense vessel or unexpected calcification. Skull: No acute calvarial abnormality. Sinuses/Orbits: Visualized paranasal sinuses and mastoids clear. Orbital soft tissues unremarkable. Other: None IMPRESSION: Old left basal ganglia and right occipital infarct. No acute intracranial abnormality. Electronically Signed   By: Charlett Nose M.D.   On: 02/05/2016 15:25   Ct Angio Neck W Or Wo Contrast  Result Date: 02/05/2016 CLINICAL DATA:  Initial evaluation for acute right-sided weakness, slurred speech, aphasia. EXAM: CT ANGIOGRAPHY HEAD AND NECK TECHNIQUE: Multidetector CT imaging of the head and neck was performed using the standard protocol during bolus administration of intravenous contrast. Multiplanar CT image reconstructions and MIPs were obtained to evaluate the vascular anatomy. Carotid stenosis measurements (when applicable) are obtained utilizing NASCET criteria, using the distal internal carotid diameter as the denominator. CONTRAST:  90 cc of Isovue 370. COMPARISON:  Prior CT from earlier the same day. FINDINGS: CT HEAD FINDINGS Brain: Atrophy with chronic microvascular ischemic disease again noted. Remote lacunar infarcts within the bilateral basal ganglia. Now evident is subtle evolving hypodensity involving the left insular cortex extending towards the left frontal operculum (series 201, image 18). This is more evident as compared to previous examination performed earlier the same day. No significant mass effect. No acute intracranial hemorrhage. Hyperdensity at the level of the left sylvian fissure suspicious for thrombus (series 201, image 19). No mass lesion, midline shift, or mass effect. No hydrocephalus. No extra-axial fluid collection. Vascular: Vascular calcifications within the carotid siphons. Hyperdensity at the level the left sylvian fissure suspicious for thrombus (series 201, image 19). No other hyperdense vessel. Skull: Scalp soft tissues within normal limits.   Calvarium intact. Sinuses: Globes and orbital soft tissues within normal limits. Patient is status post scleral banding on the left. Mild opacity within the right ethmoidal air cells. Paranasal sinuses are otherwise clear. No mastoid effusion. CTA NECK FINDINGS Aortic arch: Visualized aortic arch of normal caliber with normal branch pattern. Scattered atheromatous plaque within the arch itself and about the origin of the great vessels without flow-limiting stenosis. Visualized subclavian arteries are widely patent. Right carotid system: Right common carotid artery patent from its  origin to the bifurcation. Scattered calcified plaque about the right bifurcation without flow-limiting stenosis. Right ICA patent distally to the skullbase without stenosis, dissection, or occlusion. Left carotid system: Left common carotid artery patent from its origin to the bifurcation. Scattered calcified plaque about the left bifurcation without flow-limiting stenosis. Left ICA patent distally to the skullbase without stenosis, dissection, or occlusion. Vertebral arteries: Both of the vertebral arteries arise from the subclavian arteries. The the vertebral arteries widely patent without stenosis, dissection, or occlusion. Focal plaque noted at the origin of the right vertebral artery without high-grade stenosis. Skeleton: No acute osseous abnormality. No worrisome lytic or blastic osseous lesions. Other neck: Visualized soft tissues of the neck demonstrate no acute abnormality. Upper chest: Visualized upper mediastinum within normal limits. Layering right pleural effusion. Probable smaller left effusion. Scattered interlobular septal thickening within the partially visualized lungs, suggesting edema. Review of the MIP images confirms the above findings CTA HEAD FINDINGS Anterior circulation: Petrous segments patent bilaterally. Scattered calcified atheromatous plaque within the cavernous/ supraclinoid ICAs with mild to moderate  multifocal narrowing. ICA termini patent. Left A1 segment dominant and widely patent. Right A1 segment hypoplastic but patent as well. Anterior communicating artery normal. Anterior cerebral arteries patent to their distal aspects. Right M1 segment patent without stenosis or occlusion. Right MCA bifurcation normal. Right MCA branches well opacified to their distal aspects. Left M1 segment patent without stenosis or occlusion. Left MCA bifurcation normal. No proximal M2 branch occlusion. There is abrupt occlusion of a left M3 branch, middle division (series 602, image 80). This corresponds with previously seen hyperdensity on noncontrast portion of this exam. No other arterial branch occlusion. Left MCA branches otherwise patent. Posterior circulation: Scattered plaque within the V4 segments bilaterally, right greater than left with mild multifocal narrowing. Posterior inferior cerebral arteries patent bilaterally. Basilar artery widely patent. Superior cerebellar arteries patent bilaterally. Both of the posterior cerebral arteries supplied mainly via the basilar artery and are patent to their distal aspects. Venous sinuses: Patent. Anatomic variants: No significant anatomic variant. No aneurysm or vascular malformation. Delayed phase: Not performed. Review of the MIP images confirms the above findings IMPRESSION: 1. 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 2. Scattered atheromatous plaque throughout the carotid siphons with mild to moderate multifocal narrowing. 3. Atheromatous plaque about the carotid bifurcations bilaterally without flow-limiting stenosis. 4. Large layering right pleural effusion, with smaller left pleural effusion. Interlobular septal thickening suggestive of pulmonary edema. Critical Value/emergent results were called by telephone at the time of interpretation on 02/05/2016 at 6:19 pm to Dr. Ritta Slot , who verbally  acknowledged these results. Electronically Signed   By: Rise Mu M.D.   On: 02/05/2016 19:04   Ct Cerebral Perfusion W Contrast  Result Date: 02/05/2016 CLINICAL DATA:  Initial evaluation for acute right-sided weakness. EXAM: CT PERFUSION BRAIN TECHNIQUE: Multiphase CT imaging of the brain was performed following IV bolus contrast injection. Subsequent parametric perfusion maps were calculated using RAPID software. CONTRAST:  90 cc of Isovue 370. FINDINGS: CT Brain Perfusion Findings: CBF (<30%) Volume: 21 ccmL Perfusion (Tmax>6.0s) volume: 92 ccmL Mismatch Volume: 71 ccmL Infarction Location:Core infarct primarily involves the left frontal operculum/anterior left frontal lobe, with some extension towards the left insula and left temporal lobe. Please note that while the mismatch volume is measured as 71 cc of this exam, there is felt to be some artifact on the T max perfusion images, with elevated T-max seen  not only around the core infarct, but also within the right cerebral hemisphere as well as the right cerebellar hemisphere. Overall, while there is likely some penumbra about the area of core infarction, the recorded 71 cc is likely not accurate as to the volume of actual mismatch (which is likely significantly less, although difficult to accurately quantify on this exam). IMPRESSION: Acute small to moderate-sized left MCA territory infarct as above. Critical Value/emergent results were called by telephone at the time of interpretation on 02/05/2016 at 6:19 pm to Dr. Ritta Slot , who verbally acknowledged these results. Electronically Signed   By: Rise Mu M.D.   On: 02/05/2016 18:27   Dg Chest Port 1 View  Result Date: 02/05/2016 CLINICAL DATA:  Acute altered mental status. EXAM: PORTABLE CHEST 1 VIEW COMPARISON:  07/26/2015 FINDINGS: The patient has developed cardiomegaly with pulmonary vascular congestion and slight bilateral interstitial pulmonary edema. No acute bone  abnormality. IMPRESSION: New cardiomegaly with pulmonary vascular congestion and slight bilateral interstitial pulmonary edema. Electronically Signed   By: Francene Boyers M.D.   On: 02/05/2016 16:01    Procedures Procedures (including critical care time)  Medications Ordered in ED Medications  labetalol (NORMODYNE,TRANDATE) injection 20 mg (not administered)  naloxone (NARCAN) injection 0.4 mg (not administered)     Initial Impression / Assessment and Plan / ED Course  I have reviewed the triage vital signs and the nursing notes.  Pertinent labs & imaging results that were available during my care of the patient were reviewed by me and considered in my medical decision making (see chart for details).  Clinical Course     56 year old male presents with encephalopathy from home. He was called by one of his neighbors who noticed that he was not answering questions appropriately on the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

## 2016-02-05 NOTE — H&P (Signed)
History and Physical    [name] WUX:[id number] DOB: 10-Apr-1960 DOA: 02/05/2016  PCP: [facility]   Patient coming from: Home  Chief Complaint: Slurred speech, confusion, lethargy, right facial droop   HPI: [name] is a 56 y.o. male with medical history significant for chronic CHF, insulin-dependent diabetes mellitus, hypertension, depression, ulcerative colitis, prior CVA with minimal residual right-sided weakness, and obstructive sleep apnea who presents to the emergency department with dysarthria, confusion, lethargy, and right facial droop. Patient lives alone with family in [facility] and out of state. His brother and sister-in-law saw him for a few hours on 02/01/2016 and he was reportedly tired appearing and had some mild slurred speech at that time. He was on the phone today with his sister, an Char[employer] fundraiser, in [place] and she became concerned when he was making grunting noises and his speech was very slurred. He dropped the phone and she cannot get him back on the line, so she phoned her brother in [facility], [place] who called EMS. Patient was significantly dysarthric and with right arm weakness and right facial droop upon arrival of EMS.  ED Course: Upon arrival to the ED, patient is found to be afebrile, saturating adequately on room air, hypertensive to 200/140, with normal heart rate of respirations. EKG features sinus rhythm with left anterior fascicular block and LVH by voltage criteria with repolarization abnormality. Chest x-ray is notable for new cardiomegaly with pulmonary vascular congestion and slight bilateral interstitial edema. Noncontrast head CT was obtained and features of old left basal ganglia and right occipital infarcts, but no acute intracranial abnormality. CTA was obtained of the head and neck and demonstrates an acute left M3 occlusion with evolving left MCA infarct as well as other scattered abnormalities. Chemistry panel was notable for  potassium 3.3, BUN 24, and serum creatinine of 1.48, up from 1.0 in June. CBC is notable for a stable normocytic anemia with hemoglobin of 12.1, INR is 1.2, troponin is within normal limits, and BNP is elevated to 3370. Urinalysis is unremarkable. Patient was treated with a dose of 80 mg IV Lasix, 20 mg IV labetalol, and Narcan 0.4 mg. neurology was consulted by the ED physician and has evaluated the patient. Medical admission for further evaluation and management of acute CVA was advised. Patient will be admitted to the telemetry unit for ongoing evaluation and management of acute CVA.  Review of Systems:  All other systems reviewed and apart from HPI, are negative.  Past Medical History:  Diagnosis Date  . BPH (benign prostatic hyperplasia)   . CHF (congestive heart failure) (HCC)   . Diabetes mellitus without complication (HCC)   . Diabetic retinopathy associated with type 2 diabetes mellitus (HCC)   . Hypertension   . Lymphedema   . OSA (obstructive sleep apnea)   . TIA (transient ischemic attack)     Past Surgical History:  Procedure Laterality Date  . BLADDER SURGERY    . CARDIAC CATHETERIZATION       reports that he has never smoked. He has never used smokeless tobacco. He reports that he does not drink alcohol or use drugs.  Allergies  Allergen Reactions  . Chicken Allergy Other (See Comments)    Sneezing     Family History  Problem Relation Age of Onset  . Cancer Mother   . Cancer Father      Prior to Admission medications   Medication Sig Start Date End Date Taking? Authorizing Provider  acetaminophen (TYLENOL) 325 MG  tablet Take 650 mg by mouth 3 (three) times daily as needed for mild pain, moderate pain or headache.     Historical Provider, MD  amLODipine (NORVASC) 10 MG tablet Take 10 mg by mouth daily.    Historical Provider, MD  aspirin 325 MG tablet Take 325 mg by mouth daily.    Historical Provider, MD  atorvastatin (LIPITOR) 40 MG tablet Take 20 mg by  mouth at bedtime.     Historical Provider, MD  bumetanide (BUMEX) 2 MG tablet Take 2 mg by mouth 2 (two) times daily.    Historical Provider, MD  Calcium Carbonate-Vitamin D (CALCIUM-VITAMIN D) 500-200 MG-UNIT tablet Take 1 tablet by mouth daily.     Historical Provider, MD  carvedilol (COREG) 25 MG tablet Take 50 mg by mouth every evening.     Historical Provider, MD  citalopram (CELEXA) 40 MG tablet Take 40 mg by mouth daily.    Historical Provider, MD  docusate sodium (COLACE) 100 MG capsule Take 100 mg by mouth daily as needed for mild constipation.    Historical Provider, MD  doxazosin (CARDURA) 8 MG tablet Take 4 mg by mouth at bedtime.     Historical Provider, MD  ferrous sulfate 325 (65 FE) MG tablet Take 325 mg by mouth daily with breakfast.    Historical Provider, MD  furosemide (LASIX) 40 MG tablet Take 1 tablet (40 mg total) by mouth every other day. 05/22/15   Rhetta Mura, MD  hydrALAZINE (APRESOLINE) 100 MG tablet Take 100 mg by mouth 3 (three) times daily.    Historical Provider, MD  hydrocerin (EUCERIN) CREA Apply 1 application topically daily.    Historical Provider, MD  ibuprofen (ADVIL,MOTRIN) 800 MG tablet Take 800 mg by mouth 3 (three) times daily as needed for headache or moderate pain (with food).    Historical Provider, MD  insulin aspart (NOVOLOG) 100 UNIT/ML injection Inject 3 Units into the skin 3 (three) times daily before meals.     Historical Provider, MD  insulin glargine (LANTUS) 100 UNIT/ML injection Inject 18 Units into the skin at bedtime.     Historical Provider, MD  ipratropium (ATROVENT) 0.02 % nebulizer solution Take 2.5 mLs (0.5 mg total) by nebulization every 2 (two) hours as needed for wheezing or shortness of breath. Patient not taking: Reported on 07/26/2015 05/22/15   Rhetta Mura, MD  isosorbide mononitrate (IMDUR) 30 MG 24 hr tablet Take 30 mg by mouth daily.    Historical Provider, MD  lisinopril (PRINIVIL,ZESTRIL) 10 MG tablet Take 10 mg  by mouth daily.    Historical Provider, MD  lisinopril (PRINIVIL,ZESTRIL) 5 MG tablet Take 1 tablet (5 mg total) by mouth daily. Patient not taking: Reported on 07/19/2015 05/22/15   Rhetta Mura, MD  loperamide (IMODIUM) 2 MG capsule Take 1 capsule (2 mg total) by mouth 4 (four) times daily as needed for diarrhea or loose stools. Patient not taking: Reported on 07/26/2015 05/30/15   Barrett Henle, PA-C  metolazone (ZAROXOLYN) 5 MG tablet Take 5 mg by mouth every 3 (three) days.    Historical Provider, MD  Multiple Vitamin (MULTIVITAMIN WITH MINERALS) TABS tablet Take 1 tablet by mouth daily.    Historical Provider, MD  ondansetron (ZOFRAN ODT) 4 MG disintegrating tablet Take 1 tablet (4 mg total) by mouth every 8 (eight) hours as needed for nausea or vomiting. 05/30/15   Barrett Henle, PA-C  oxybutynin (DITROPAN) 5 MG tablet Take 5 mg by mouth 3 (three) times daily.  Historical Provider, MD  pantoprazole (PROTONIX) 40 MG tablet Take 40 mg by mouth daily. Take on an empty stomach 30 minutes before a meal    Historical Provider, MD  polyethylene glycol (MIRALAX / GLYCOLAX) packet Take 17 g by mouth daily as needed for mild constipation or moderate constipation.    Historical Provider, MD  potassium chloride SA (K-DUR,KLOR-CON) 20 MEQ tablet Take 20 mEq by mouth daily.    Historical Provider, MD  silver sulfADIAZINE (SILVADENE) 1 % cream Apply 1 application topically daily as needed (skin care).     Historical Provider, MD  spironolactone (ALDACTONE) 25 MG tablet Take 25 mg by mouth 2 (two) times daily.     Historical Provider, MD  sulfaSALAzine (AZULFIDINE) 500 MG tablet Take 2,000 mg by mouth 2 (two) times daily.     Historical Provider, MD  tamsulosin (FLOMAX) 0.4 MG CAPS capsule Take 0.4 mg by mouth at bedtime.     Historical Provider, MD    Physical Exam: Vitals:   02/05/16 1830 02/05/16 1845 02/05/16 1900 02/05/16 2103  BP: (!) 192/136 (!) 197/128 (!) 209/135 (!)  212/124  Pulse: 84 79 73 87  Resp: 21 (!) 0 23 20  Temp:    97.9 F (36.6 C)  TempSrc:    Oral  SpO2: 100% 100% 100% 100%  Weight:    108.9 kg (240 lb 1.3 oz)  Height:    5\' 11"  (1.803 m)      Constitutional: No respiratory distress, appears comfortable, lethargic  Eyes: PERTLA, lids and conjunctivae normal ENMT: Mucous membranes are moist. Posterior pharynx clear of any exudate or lesions.   Neck: normal, supple, no masses, no thyromegaly Respiratory: Crackles at both bases and mid-lung zones. Normal respiratory effort. No accessory muscle use.  Cardiovascular: S1 & S2 heard, regular rate and rhythm, grade 3 systolic murmur at apex. Marked bilateral LE pitting edema. Neck veins mildly distended. Abdomen: No distension, no tenderness, no masses palpated. Bowel sounds normal.  Musculoskeletal: no clubbing / cyanosis. No joint deformity upper and lower extremities. Normal muscle tone.  Skin: no significant rashes, lesions, ulcers. Warm, dry, well-perfused. Neurologic: CN 2-12 grossly intact. Sensation to light touch intact throughout. Strength 5/5 in LUE and LLE; 3-4/5 in RUE and RLE. Speech slow and with mild dysarthria.   Psychiatric: Alert and oriented x 3. Calm, cooperative.    Labs on Admission: I have personally reviewed following labs and imaging studies  CBC:  Recent Labs Lab 02/05/16 1531 02/05/16 1532  WBC  --  6.3  NEUTROABS  --  5.0  HGB 12.6* 12.1*  HCT 37.0* 37.0*  MCV  --  87.7  PLT  --  230   Basic Metabolic Panel:  Recent Labs Lab 02/05/16 1531 02/05/16 1532  NA 143 143  K 3.6 3.3*  CL 107 111  CO2  --  25  GLUCOSE 104* 105*  BUN 33* 24*  CREATININE 1.50* 1.48*  CALCIUM  --  9.1   GFR: Estimated Creatinine Clearance: 71.6 mL/min (by C-G formula based on SCr of 1.48 mg/dL (H)). Liver Function Tests:  Recent Labs Lab 02/05/16 1532  AST 42*  ALT 27  ALKPHOS 227*  BILITOT 0.9  PROT 6.0*  ALBUMIN 2.8*   No results for input(s): LIPASE,  AMYLASE in the last 168 hours. No results for input(s): AMMONIA in the last 168 hours. Coagulation Profile:  Recent Labs Lab 02/05/16 1532  INR 1.18   Cardiac Enzymes: No results for input(s): CKTOTAL, CKMB, CKMBINDEX,  TROPONINI in the last 168 hours. BNP (last 3 results) No results for input(s): PROBNP in the last 8760 hours. HbA1C: No results for input(s): HGBA1C in the last 72 hours. CBG: No results for input(s): GLUCAP in the last 168 hours. Lipid Profile: No results for input(s): CHOL, HDL, LDLCALC, TRIG, CHOLHDL, LDLDIRECT in the last 72 hours. Thyroid Function Tests: No results for input(s): TSH, T4TOTAL, FREET4, T3FREE, THYROIDAB in the last 72 hours. Anemia Panel: No results for input(s): VITAMINB12, FOLATE, FERRITIN, TIBC, IRON, RETICCTPCT in the last 72 hours. Urine analysis:    Component Value Date/Time   COLORURINE YELLOW 02/05/2016 1624   APPEARANCEUR CLEAR 02/05/2016 1624   LABSPEC 1.015 02/05/2016 1624   PHURINE 5.0 02/05/2016 1624   GLUCOSEU NEGATIVE 02/05/2016 1624   HGBUR SMALL (A) 02/05/2016 1624   BILIRUBINUR NEGATIVE 02/05/2016 1624   KETONESUR NEGATIVE 02/05/2016 1624   PROTEINUR >=300 (A) 02/05/2016 1624   NITRITE NEGATIVE 02/05/2016 1624   LEUKOCYTESUR NEGATIVE 02/05/2016 1624   Sepsis Labs: @LABRCNTIP (procalcitonin:4,lacticidven:4) )No results found for this or any previous visit (from the past 240 hour(s)).   Radiological Exams on Admission: Ct Angio Head W Or Wo Contrast  Result Date: 02/05/2016 CLINICAL DATA:  Initial evaluation for acute right-sided weakness, slurred speech, aphasia. EXAM: CT ANGIOGRAPHY HEAD AND NECK TECHNIQUE: Multidetector CT imaging of the head and neck was performed using the standard protocol during bolus administration of intravenous contrast. Multiplanar CT image reconstructions and MIPs were obtained to evaluate the vascular anatomy. Carotid stenosis measurements (when applicable) are obtained utilizing NASCET  criteria, using the distal internal carotid diameter as the denominator. CONTRAST:  90 cc of Isovue 370. COMPARISON:  Prior CT from earlier the same day. FINDINGS: CT HEAD FINDINGS Brain: Atrophy with chronic microvascular ischemic disease again noted. Remote lacunar infarcts within the bilateral basal ganglia. Now evident is subtle evolving hypodensity involving the left insular cortex extending towards the left frontal operculum (series 201, image 18). This is more evident as compared to previous examination performed earlier the same day. No significant mass effect. No acute intracranial hemorrhage. Hyperdensity at the level of the left sylvian fissure suspicious for thrombus (series 201, image 19). No mass lesion, midline shift, or mass effect. No hydrocephalus. No extra-axial fluid collection. Vascular: Vascular calcifications within the carotid siphons. Hyperdensity at the level the left sylvian fissure suspicious for thrombus (series 201, image 19). No other hyperdense vessel. Skull: Scalp soft tissues within normal limits.  Calvarium intact. Sinuses: Globes and orbital soft tissues within normal limits. Patient is status post scleral banding on the left. Mild opacity within the right ethmoidal air cells. Paranasal sinuses are otherwise clear. No mastoid effusion. CTA NECK FINDINGS Aortic arch: Visualized aortic arch of normal caliber with normal branch pattern. Scattered atheromatous plaque within the arch itself and about the origin of the great vessels without flow-limiting stenosis. Visualized subclavian arteries are widely patent. Right carotid system: Right common carotid artery patent from its origin to the bifurcation. Scattered calcified plaque about the right bifurcation without flow-limiting stenosis. Right ICA patent distally to the skullbase without stenosis, dissection, or occlusion. Left carotid system: Left common carotid artery patent from its origin to the bifurcation. Scattered calcified  plaque about the left bifurcation without flow-limiting stenosis. Left ICA patent distally to the skullbase without stenosis, dissection, or occlusion. Vertebral arteries: Both of the vertebral arteries arise from the subclavian arteries. The the vertebral arteries widely patent without stenosis, dissection, or occlusion. Focal plaque noted at the origin of the  right vertebral artery without high-grade stenosis. Skeleton: No acute osseous abnormality. No worrisome lytic or blastic osseous lesions. Other neck: Visualized soft tissues of the neck demonstrate no acute abnormality. Upper chest: Visualized upper mediastinum within normal limits. Layering right pleural effusion. Probable smaller left effusion. Scattered interlobular septal thickening within the partially visualized lungs, suggesting edema. Review of the MIP images confirms the above findings CTA HEAD FINDINGS Anterior circulation: Petrous segments patent bilaterally. Scattered calcified atheromatous plaque within the cavernous/ supraclinoid ICAs with mild to moderate multifocal narrowing. ICA termini patent. Left A1 segment dominant and widely patent. Right A1 segment hypoplastic but patent as well. Anterior communicating artery normal. Anterior cerebral arteries patent to their distal aspects. Right M1 segment patent without stenosis or occlusion. Right MCA bifurcation normal. Right MCA branches well opacified to their distal aspects. Left M1 segment patent without stenosis or occlusion. Left MCA bifurcation normal. No proximal M2 branch occlusion. There is abrupt occlusion of a left M3 branch, middle division (series 602, image 80). This corresponds with previously seen hyperdensity on noncontrast portion of this exam. No other arterial branch occlusion. Left MCA branches otherwise patent. Posterior circulation: Scattered plaque within the V4 segments bilaterally, right greater than left with mild multifocal narrowing. Posterior inferior cerebral  arteries patent bilaterally. Basilar artery widely patent. Superior cerebellar arteries patent bilaterally. Both of the posterior cerebral arteries supplied mainly via the basilar artery and are patent to their distal aspects. Venous sinuses: Patent. Anatomic variants: No significant anatomic variant. No aneurysm or vascular malformation. Delayed phase: Not performed. Review of the MIP images confirms the above findings IMPRESSION: 1. 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 2. Scattered atheromatous plaque throughout the carotid siphons with mild to moderate multifocal narrowing. 3. Atheromatous plaque about the carotid bifurcations bilaterally without flow-limiting stenosis. 4. Large layering right pleural effusion, with smaller left pleural effusion. Interlobular septal thickening suggestive of pulmonary edema. Critical Value/emergent results were called by telephone at the time of interpretation on 02/05/2016 at 6:19 pm to Dr. Ritta Slot , who verbally acknowledged these results. Electronically Signed   By: Rise Mu M.D.   On: 02/05/2016 19:04   Ct Head Wo Contrast  Result Date: 02/05/2016 CLINICAL DATA:  Altered mental status, aphasia, right-sided weakness EXAM: CT HEAD WITHOUT CONTRAST TECHNIQUE: Contiguous axial images were obtained from the base of the skull through the vertex without intravenous contrast. COMPARISON:  MRI 05/19/2000 FINDINGS: Brain: Old lacunar infarct in the left basal ganglia. Old infarct and encephalomalacia in the right occipital lobe. No acute intracranial abnormality. Specifically, no hemorrhage, hydrocephalus, mass lesion, acute infarction, or significant intracranial injury. Vascular: No hyperdense vessel or unexpected calcification. Skull: No acute calvarial abnormality. Sinuses/Orbits: Visualized paranasal sinuses and mastoids clear. Orbital soft tissues unremarkable. Other: None  IMPRESSION: Old left basal ganglia and right occipital infarct. No acute intracranial abnormality. Electronically Signed   By: Charlett Nose M.D.   On: 02/05/2016 15:25   Ct Angio Neck W Or Wo Contrast  Result Date: 02/05/2016 CLINICAL DATA:  Initial evaluation for acute right-sided weakness, slurred speech, aphasia. EXAM: CT ANGIOGRAPHY HEAD AND NECK TECHNIQUE: Multidetector CT imaging of the head and neck was performed using the standard protocol during bolus administration of intravenous contrast. Multiplanar CT image reconstructions and MIPs were obtained to evaluate the vascular anatomy. Carotid stenosis measurements (when applicable) are obtained utilizing NASCET criteria, using the distal internal carotid diameter as the denominator. CONTRAST:  90 cc  of Isovue 370. COMPARISON:  Prior CT from earlier the same day. FINDINGS: CT HEAD FINDINGS Brain: Atrophy with chronic microvascular ischemic disease again noted. Remote lacunar infarcts within the bilateral basal ganglia. Now evident is subtle evolving hypodensity involving the left insular cortex extending towards the left frontal operculum (series 201, image 18). This is more evident as compared to previous examination performed earlier the same day. No significant mass effect. No acute intracranial hemorrhage. Hyperdensity at the level of the left sylvian fissure suspicious for thrombus (series 201, image 19). No mass lesion, midline shift, or mass effect. No hydrocephalus. No extra-axial fluid collection. Vascular: Vascular calcifications within the carotid siphons. Hyperdensity at the level the left sylvian fissure suspicious for thrombus (series 201, image 19). No other hyperdense vessel. Skull: Scalp soft tissues within normal limits.  Calvarium intact. Sinuses: Globes and orbital soft tissues within normal limits. Patient is status post scleral banding on the left. Mild opacity within the right ethmoidal air cells. Paranasal sinuses are otherwise  clear. No mastoid effusion. CTA NECK FINDINGS Aortic arch: Visualized aortic arch of normal caliber with normal branch pattern. Scattered atheromatous plaque within the arch itself and about the origin of the great vessels without flow-limiting stenosis. Visualized subclavian arteries are widely patent. Right carotid system: Right common carotid artery patent from its origin to the bifurcation. Scattered calcified plaque about the right bifurcation without flow-limiting stenosis. Right ICA patent distally to the skullbase without stenosis, dissection, or occlusion. Left carotid system: Left common carotid artery patent from its origin to the bifurcation. Scattered calcified plaque about the left bifurcation without flow-limiting stenosis. Left ICA patent distally to the skullbase without stenosis, dissection, or occlusion. Vertebral arteries: Both of the vertebral arteries arise from the subclavian arteries. The the vertebral arteries widely patent without stenosis, dissection, or occlusion. Focal plaque noted at the origin of the right vertebral artery without high-grade stenosis. Skeleton: No acute osseous abnormality. No worrisome lytic or blastic osseous lesions. Other neck: Visualized soft tissues of the neck demonstrate no acute abnormality. Upper chest: Visualized upper mediastinum within normal limits. Layering right pleural effusion. Probable smaller left effusion. Scattered interlobular septal thickening within the partially visualized lungs, suggesting edema. Review of the MIP images confirms the above findings CTA HEAD FINDINGS Anterior circulation: Petrous segments patent bilaterally. Scattered calcified atheromatous plaque within the cavernous/ supraclinoid ICAs with mild to moderate multifocal narrowing. ICA termini patent. Left A1 segment dominant and widely patent. Right A1 segment hypoplastic but patent as well. Anterior communicating artery normal. Anterior cerebral arteries patent to their distal  aspects. Right M1 segment patent without stenosis or occlusion. Right MCA bifurcation normal. Right MCA branches well opacified to their distal aspects. Left M1 segment patent without stenosis or occlusion. Left MCA bifurcation normal. No proximal M2 branch occlusion. There is abrupt occlusion of a left M3 branch, middle division (series 602, image 80). This corresponds with previously seen hyperdensity on noncontrast portion of this exam. No other arterial branch occlusion. Left MCA branches otherwise patent. Posterior circulation: Scattered plaque within the V4 segments bilaterally, right greater than left with mild multifocal narrowing. Posterior inferior cerebral arteries patent bilaterally. Basilar artery widely patent. Superior cerebellar arteries patent bilaterally. Both of the posterior cerebral arteries supplied mainly via the basilar artery and are patent to their distal aspects. Venous sinuses: Patent. Anatomic variants: No significant anatomic variant. No aneurysm or vascular malformation. Delayed phase: Not performed. Review of the MIP images confirms the above findings IMPRESSION: 1. Acute left M3 occlusion with  evolving left MCA territory infarct, best seen on perfusion portion of this exam. 2. Scattered atheromatous plaque throughout the carotid siphons with mild to moderate multifocal narrowing. 3. Atheromatous plaque about the carotid bifurcations bilaterally without flow-limiting stenosis. 4. Large layering right pleural effusion, with smaller left pleural effusion. Interlobular septal thickening suggestive of pulmonary edema. Critical Value/emergent results were called by telephone at the time of interpretation on 02/05/2016 at 6:19 pm to Dr. Ritta Slot , who verbally acknowledged these results. Electronically Signed   By: Rise Mu M.D.   On: 02/05/2016 19:04   Ct Cerebral Perfusion W Contrast  Result Date: 02/05/2016 CLINICAL DATA:  Initial evaluation for acute right-sided  weakness. EXAM: CT PERFUSION BRAIN TECHNIQUE: Multiphase CT imaging of the brain was performed following IV bolus contrast injection. Subsequent parametric perfusion maps were calculated using RAPID software. CONTRAST:  90 cc of Isovue 370. FINDINGS: CT Brain Perfusion Findings: CBF (<30%) Volume: 21 ccmL Perfusion (Tmax>6.0s) volume: 92 ccmL Mismatch Volume: 71 ccmL Infarction Location:Core infarct primarily involves the left frontal operculum/anterior left frontal lobe, with some extension towards the left insula and left temporal lobe. Please note that while the mismatch volume is measured as 71 cc of this exam, there is felt to be some artifact on the T max perfusion images, with elevated T-max seen not only around the core infarct, but also within the right cerebral hemisphere as well as the right cerebellar hemisphere. Overall, while there is likely some penumbra about the area of core infarction, the recorded 71 cc is likely not accurate as to the volume of actual mismatch (which is likely significantly less, although difficult to accurately quantify on this exam). IMPRESSION: Acute small to moderate-sized left MCA territory infarct as above. Critical Value/emergent results were called by telephone at the time of interpretation on 02/05/2016 at 6:19 pm to Dr. Ritta Slot , who verbally acknowledged these results. Electronically Signed   By: Rise Mu M.D.   On: 02/05/2016 18:27   Dg Chest Port 1 View  Result Date: 02/05/2016 CLINICAL DATA:  Acute altered mental status. EXAM: PORTABLE CHEST 1 VIEW COMPARISON:  07/26/2015 FINDINGS: The patient has developed cardiomegaly with pulmonary vascular congestion and slight bilateral interstitial pulmonary edema. No acute bone abnormality. IMPRESSION: New cardiomegaly with pulmonary vascular congestion and slight bilateral interstitial pulmonary edema. Electronically Signed   By: Francene Boyers M.D.   On: 02/05/2016 16:01    EKG: Independently  reviewed. Sinus rhythm, LAFB, LVH with secondary repolarization abnormality  Assessment/Plan  1. Acute ischemic CVA  - Pt presents with right-sided weakness and dysarthria  - LKW unclear; brother and sister-in-law were with him 02/01/16 and he was mildly dysarthric and fatigued at that time; was grunting and significantly dysarthric on phone with sister just PTA, prompting EMS activation - Non-contrast head CT neg for acute abnormality; CTA head and neck reveals acute left M3 occlusion with evolving left MCA infarct  - Neurology is consulting and much appreciated; will follow-up recommendations  - tPA not given d/t unclear LKW  - Plan for MRI brain, TTE, fasting lipid panel, A1c, PT/OT/SLP evals  - Permit HTN to 220/110 during acute-phase ischemic CVA  - Frequent neuro checks, maintain euglycemia, normothermia, euvolemia   2. Acute on chronic diastolic CHF  - There is marked BLE edema, JVD, crackles, BNP 3370, CXR with interstitial edema  - TTE (04/13/15) with EF 50-55%, grade 2 diastolic dysfunction, severe concentric hypertrophy, moderate-severe TR, mild AR, and moderate pulmonary HTN  - He has  Lasix, Bumex, and Zaroxolyn, Aldactone, Coreg, and lisinopril on home med list - Lasix 80 mg IV given x1 in ED with 800 cc out; plan to continue Lasix 80 IV q12h and adjust according to response  - Hold lisinopril given AKI, resume as tolerated    3. Hypertension with hypertensive urgency - BP 200/140 in ED, likely exacerbated by acute CHF and acute cerebral artery occlusion  - Given the acute ischemic CVA, will treat only for SBP >220 or DBP >110 for now in acute phase  - His Norvasc and Coreg will be held initially, resumed as appropriate   4. Insulin-dependent DM  - A1c was 5.9% in 2016 - He is managed at home with Lantus 18 units qHS and Novolog 3 units TID with meals - He is currently NPO pending SLP eval  - Start with Lantus at 16 units qHS with low-intensity SSI, adjust prn   5.  Hypokalemia - Serum potassium 3.3 on admission  - Given 30 mEq IV potassium and 1 g mag - Monitor on telemetry  - Follow BMP qAM during diuresis    6. Normocytic anemia  - Hgb 12.1 on admission  - Stable relative to priors with no sign of bleeding   7. OSA - CPAP qHS     DVT prophylaxis: sq Lovenox  Code Status: Full  Family Communication: Brother Youth worker) updated at bedside  Disposition Plan: Admit to telemetry Consults called: Neurology Admission status: Inpatient    Briscoe Deutscher, MD [facility] Pager [phone]  If 7PM-7AM, please contact night-coverage [email or web address] Password [email or web address]  02/05/2016, 9:52 PM

## 2016-02-05 NOTE — ED Notes (Signed)
Notified ED Doctor of patient symptoms and assessed patient. Called CT to have patient have the CT STAT.

## 2016-02-05 NOTE — ED Triage Notes (Signed)
Pt presents from home via [facility] - pt spoke with a neighbor on the phone today and was not answering questions appropriately so the neighbor called EMS. Pt lives alone and no LSN is available. Upon EMS arrival pt was aphasic, but is not able to answer questions with very dysarthric speech. Also has RUE drift and grip weakness, right facial droop.  Pt is oriented to self and place.

## 2016-02-05 NOTE — ED Notes (Signed)
Report given to primary RN Tammy Sours who assumed care at this time.

## 2016-02-05 NOTE — Consult Note (Signed)
Neurology Consultation Reason for Consult: right sided weakness Referring Physician: Clydene Pugh, D  CC: right sided weakness  History is obtained from:Patient, chart.   HPI: [name] is a 56 y.o. male with a history of previous stroke with mild right-sided hemiparesis who presents with significant right weakness and aphasia today. He is able to indicate to me that he woke up like this. It is less clear to me, however, if he had symptoms yesterday or not.  He called a neighbor earlier today and didn't sound right on the phone so the neighbor came and checked on him and called 911.  Given unclear time of onset, he was taken for CT perfusion/angiogram which shows a M3 branch occlusion that appears infarcted. He is therefore not a candidate for IR therapy.  LKW: Unclear, likely last night prior to bed tpa given?: no, out of window    XBJ:YNWGNF to obtain due to altered mental status.  Past Medical History:  Diagnosis Date  . BPH (benign prostatic hyperplasia)   . CHF (congestive heart failure) (HCC)   . Diabetes mellitus without complication (HCC)   . Diabetic retinopathy associated with type 2 diabetes mellitus (HCC)   . Hypertension   . Lymphedema   . OSA (obstructive sleep apnea)   . TIA (transient ischemic attack)      Family History  Problem Relation Age of Onset  . Cancer Mother   . Cancer Father      Social History:  reports that he has never smoked. He has never used smokeless tobacco. He reports that he does not drink alcohol or use drugs.   Exam: Current vital signs: BP (!) 209/135   Pulse 73   Temp 98.2 F (36.8 C) (Rectal)   Resp 23   SpO2 100%  Vital signs in last 24 hours: Temp:  [98 F (36.7 C)-98.2 F (36.8 C)] 98.2 F (36.8 C) (01/02 1621) Pulse Rate:  [70-96] 73 (01/02 1900) Resp:  [0-23] 23 (01/02 1900) BP: (184-209)/(114-149) 209/135 (01/02 1900) SpO2:  [95 %-100 %] 100 % (01/02 1900)   Physical Exam  Constitutional: Appears  well-developed and well-nourished.  Psych: Affect appropriate to situation Eyes: No scleral injection HENT: No OP obstrucion Head: Normocephalic.  Cardiovascular: Normal rate and regular rhythm.  Respiratory: Effort normal and breath sounds normal to anterior ascultation GI: Soft.  No distension. There is no tenderness.  Skin: WDI  Neuro: Mental Status: Patient is awake, alert, He is able to tell me his name, and answer some simple questions. He has a moderate to severe aphasia but is able to follow most commands. Cranial Nerves: II: Visual Fields are full. Pupils are equal, round, and reactive to light.   III,IV, VI: He has a left gaze preference V: Facial sensation is decreased on the right VII: Facial movement is notable for right facial weakness VIII: hearing is intact to voice X: Uvula elevates symmetrically XI: Shoulder shrug is symmetric. XII: tongue is midline without atrophy or fasciculations.  Motor: Tone is normal. Bulk is normal. 5/5 strength was present on the left, though limited due to edema in the left leg, he has 3/5 weakness of the right leg 4 minus/5 in the right arm Sensory: Sensation is decreased on the right Cerebellar: Unable to perform on the right, no clear ataxia on the left   I have reviewed labs in epic and the results pertinent to this consultation are: Creatinine 1.48  I have reviewed the images obtained: CT perfusion/angiogram-M3 occlusion with area of  infarct in accordance with this. There are other areas listed as possible hypoperfusion, unclear if artifact or embolic shower.  Impression: 57 year old male with likely embolic ischemic stroke. He will need to be admitted for further workup.  Recommendations: 1. HgbA1c, fasting lipid panel 2. MRI  of the brain without contrast 3. Frequent neuro checks 4. Echocardiogram 5. Carotid dopplers/MRA are not needed given that we have a CTA 6. Prophylactic therapy-Antiplatelet med: Aspirin - dose 325mg   PO or 300mg  PR 7. Risk factor modification 8. Telemetry monitoring 9. PT consult, OT consult, Speech consult 10. please page stroke NP  Or  PA  Or MD from 8am -4 pm starting 1/3 as this patient will be followed by the stroke team at this point.   You can look them up on [email or web address]     Ritta Slot, MD [facility] [phone]  If 7pm- 7am, please page neurology on call as listed in AMION.

## 2016-02-06 ENCOUNTER — Inpatient Hospital Stay (HOSPITAL_COMMUNITY): Payer: Medicare Other

## 2016-02-06 ENCOUNTER — Encounter (HOSPITAL_COMMUNITY): Payer: Medicare Other

## 2016-02-06 DIAGNOSIS — R531 Weakness: Secondary | ICD-10-CM

## 2016-02-06 DIAGNOSIS — I639 Cerebral infarction, unspecified: Secondary | ICD-10-CM

## 2016-02-06 DIAGNOSIS — I63512 Cerebral infarction due to unspecified occlusion or stenosis of left middle cerebral artery: Secondary | ICD-10-CM

## 2016-02-06 DIAGNOSIS — I509 Heart failure, unspecified: Secondary | ICD-10-CM

## 2016-02-06 DIAGNOSIS — I6300 Cerebral infarction due to thrombosis of unspecified precerebral artery: Secondary | ICD-10-CM

## 2016-02-06 DIAGNOSIS — I10 Essential (primary) hypertension: Secondary | ICD-10-CM

## 2016-02-06 DIAGNOSIS — I63412 Cerebral infarction due to embolism of left middle cerebral artery: Secondary | ICD-10-CM

## 2016-02-06 LAB — URINE CULTURE: Culture: NO GROWTH

## 2016-02-06 LAB — BASIC METABOLIC PANEL
ANION GAP: 11 (ref 5–15)
BUN: 23 mg/dL — AB (ref 6–20)
CALCIUM: 8.9 mg/dL (ref 8.9–10.3)
CO2: 24 mmol/L (ref 22–32)
CREATININE: 1.45 mg/dL — AB (ref 0.61–1.24)
Chloride: 110 mmol/L (ref 101–111)
GFR calc Af Amer: >60 mL/min (ref >60)
GFR, EST NON AFRICAN AMERICAN: 53 mL/min — AB (ref >60)
GLUCOSE: 112 mg/dL — AB (ref 65–99)
Potassium: 3.8 mmol/L (ref 3.5–5.1)
Sodium: 145 mmol/L (ref 135–145)

## 2016-02-06 LAB — GLUCOSE, CAPILLARY
GLUCOSE-CAPILLARY: 103 mg/dL — AB (ref 65–99)
GLUCOSE-CAPILLARY: 106 mg/dL — AB (ref 65–99)
GLUCOSE-CAPILLARY: 109 mg/dL — AB (ref 65–99)
GLUCOSE-CAPILLARY: 123 mg/dL — AB (ref 65–99)
Glucose-Capillary: 106 mg/dL — ABNORMAL HIGH (ref 65–99)

## 2016-02-06 LAB — RAPID URINE DRUG SCREEN, HOSP PERFORMED
AMPHETAMINES: NOT DETECTED
Barbiturates: NOT DETECTED
Benzodiazepines: NOT DETECTED
COCAINE: NOT DETECTED
OPIATES: NOT DETECTED
TETRAHYDROCANNABINOL: NOT DETECTED

## 2016-02-06 LAB — LIPID PANEL
CHOL/HDL RATIO: 3.3 ratio
CHOLESTEROL: 157 mg/dL (ref 0–200)
HDL: 47 mg/dL (ref >40)
LDL Cholesterol: 94 mg/dL (ref 0–99)
Triglycerides: 80 mg/dL (ref <150)
VLDL: 16 mg/dL (ref 0–40)

## 2016-02-06 LAB — MAGNESIUM: Magnesium: 1.9 mg/dL (ref 1.7–2.4)

## 2016-02-06 MED ORDER — ASPIRIN EC 325 MG PO TBEC
325.0000 mg | DELAYED_RELEASE_TABLET | Freq: Every day | ORAL | Status: DC
  Administered 2016-02-06 – 2016-02-11 (×3): 325 mg via ORAL
  Filled 2016-02-06 (×5): qty 1

## 2016-02-06 MED ORDER — FUROSEMIDE 10 MG/ML IJ SOLN
80.0000 mg | Freq: Two times a day (BID) | INTRAMUSCULAR | Status: DC
  Administered 2016-02-06 (×2): 80 mg via INTRAVENOUS
  Filled 2016-02-06 (×4): qty 8

## 2016-02-06 MED ORDER — ASPIRIN 300 MG RE SUPP
300.0000 mg | Freq: Every day | RECTAL | Status: DC
  Administered 2016-02-09 – 2016-02-10 (×2): 300 mg via RECTAL
  Filled 2016-02-06 (×4): qty 1

## 2016-02-06 MED ORDER — CLOPIDOGREL BISULFATE 75 MG PO TABS
75.0000 mg | ORAL_TABLET | Freq: Every day | ORAL | Status: DC
  Administered 2016-02-06 – 2016-02-11 (×5): 75 mg via ORAL
  Filled 2016-02-06 (×7): qty 1

## 2016-02-06 NOTE — Care Management Note (Signed)
Case Management Note  Patient Details  Name: [name] MRN: [id number] Date of Birth: 07-May-1960  Subjective/Objective:  Pt admitted with CVA. He is from home alone.                   Action/Plan: Awaiting PT/OT recommendations and MD orders. CM following for d/c needs.   Expected Discharge Date:                  Expected Discharge Plan:     In-House Referral:     Discharge planning Services     Post Acute Care Choice:    Choice offered to:     DME Arranged:    DME Agency:     HH Arranged:    HH Agency:     Status of Service:  In process, will continue to follow  If discussed at Long Length of Stay Meetings, dates discussed:    Additional Comments:  Kermit Balo, RN 02/06/2016, 11:49 AM

## 2016-02-06 NOTE — Evaluation (Signed)
Occupational Therapy Evaluation Patient Details Name: [name] MRN: [id number] DOB: September 05, 1960 Today's Date: 02/06/2016    History of Present Illness 56 y.o. male admitted to [facility] on 02/05/16 for right facial droop and right sided weakness found to have a L MCA CVA.  Pt with significant PMHx of TIA, HTN, DM with diabetic retinopathy, and CHF.   Clinical Impression   PT admitted with L frontal MCA infarct.. Pt currently with functional limitiations due to the deficits listed below (see OT problem list). PTA was independent with adls per patient report. No family present to confirm patient reports.  Pt will benefit from skilled OT to increase their independence and safety with adls and balance to allow discharge CIR. Pt demonstrates R inattention visually and to the body which will affect all adls and iadls. Pt could greatly benefit from CIR. Pt oob to chair this session. Recommend OOB for all meals to decr risk for skin break down and promote continued progress toward therapy goals.      Follow Up Recommendations  CIR    Equipment Recommendations  3 in 1 bedside commode    Recommendations for Other Services Rehab consult     Precautions / Restrictions Precautions Precautions: Fall Precaution Comments: watch BP       Mobility Bed Mobility Overal bed mobility: Needs Assistance Bed Mobility: Supine to Sit;Rolling Rolling: Mod assist;Min assist   Supine to sit: +2 for safety/equipment;Min assist     General bed mobility comments: Min assist to help progress legs EOB and support trunk during transition to sitting.   Min assist to roll to his right side and mod assist to roll to his left side.   Transfers Overall transfer level: Needs assistance Equipment used: 1 person hand held assist Transfers: Sit to/from [employer] Sit to Stand: +2 physical assistance;Mod assist Stand pivot transfers: +2 safety/equipment;Mod assist       General transfer comment:  Two person mod assist to support trunk, anteriorly weight shift, block and stabilize his right leg.  Pt was able to take steps around to the elevated chair to his left by dragging his hyperextended  R leg behind him.      Balance Overall balance assessment: Needs assistance Sitting-balance support: Feet supported;Bilateral upper extremity supported Sitting balance-Leahy Scale: Fair Sitting balance - Comments: supervision EOB   Standing balance support: Bilateral upper extremity supported Standing balance-Leahy Scale: Poor Standing balance comment: mod assist in standing. strong reliance on BIL Calves support on bed surface                            ADL Overall ADL's : Needs assistance/impaired Eating/Feeding: Set up;Sitting Eating/Feeding Details (indicate cue type and reason): cues to beware of drinking coffee it was hot Grooming: Wash/dry face;Minimal assistance;Sitting   Upper Body Bathing: Moderate assistance   Lower Body Bathing: Total assistance   Upper Body Dressing : Moderate assistance Upper Body Dressing Details (indicate cue type and reason): cues to dress R UE Lower Body Dressing: Total assistance Lower Body Dressing Details (indicate cue type and reason): 2-3 plus pitting edema present in feet Toilet Transfer: +2 for physical assistance;Moderate assistance;Stand-pivot             General ADL Comments: Pt transferred to chair from EOB. pt with R inattention     Vision Vision Assessment?: Vision impaired- to be further tested in functional context Additional Comments: through functional assessment patient was able to demonstrate  R inattention. pt needed cues but could track to the R with cues. OT to follow up with further assessment. Due to R UE inattention question pencil paper testing at this time   Perception Perception Perception Tested?: Yes Perception Deficits: Inattention/neglect Inattention/Neglect: Does not attend to right side of body;Does  not attend to right visual field   Praxis      Pertinent Vitals/Pain Pain Assessment: No/denies pain     Hand Dominance Right   Extremity/Trunk Assessment Upper Extremity Assessment Upper Extremity Assessment: RUE deficits/detail RUE Deficits / Details: activation present of R UE when R arm is in visual field. pt in supine able to bring elbow to mouth, active movement bicep and tricep , Brunstrom III grasp. pt opening closing digits but with max cues and L UE at the same time. Pt demonstrates strong inattention to R side RUE Sensation: decreased light touch;decreased proprioception RUE Coordination: decreased fine motor;decreased gross motor   Lower Extremity Assessment Lower Extremity Assessment: Defer to PT evaluation RLE Deficits / Details: right leg at least 3/5 knee extension, but in standing he hyperextends it.  He is able to move it both forward and backward with weakness throughout.     Cervical / Trunk Assessment Cervical / Trunk Assessment: Normal   Communication Communication Communication: Expressive difficulties   Cognition Arousal/Alertness: Awake/alert Behavior During Therapy: WFL for tasks assessed/performed Overall Cognitive Status: Impaired/Different from baseline Area of Impairment: Following commands;Safety/judgement;Awareness;Problem solving       Following Commands: Follows one step commands consistently (on the left side) Safety/Judgement: Decreased awareness of safety;Decreased awareness of deficits Awareness: Intellectual Problem Solving: Difficulty sequencing;Requires verbal cues;Requires tactile cues General Comments: Pt with left gaze and right inattention.  Needs cues to come past midline and attend to the right.  He is able to help manage his arm after you help him find it visually.  Not sure of accuracy of yes / no questions and family not present to confirm. pt incontinent of bowel and bladder on arrival with adult diaper. pt does not request (A)  and rather sleeping with soiled diaper. Pt needed cues to have (A). Pt reports "yes" with head when asked "should you get back to bed by yourself " but when asked to verbalized states "no"    General Comments       Exercises       Shoulder Instructions      Home Living Family/patient expects to be discharged to:: Private residence Living Arrangements: Alone   Type of Home: Apartment Home Access: Level entry     Home Layout: One level               Home Equipment: Walker - 2 wheels   Additional Comments: Unsure of accuracy of hx as pt is giving inconsistant head nods and inconsistant answers to yes/no questions.        Prior Functioning/Environment Level of Independence: Independent with assistive device(s)        Comments: Pt reports he walked with a walker, but not sure how accurate his answers are at this time.         OT Problem List: Decreased strength;Decreased activity tolerance;Decreased range of motion;Impaired balance (sitting and/or standing);Impaired vision/perception;Decreased coordination;Decreased cognition;Decreased safety awareness;Decreased knowledge of use of DME or AE;Decreased knowledge of precautions;Cardiopulmonary status limiting activity;Impaired sensation;Obesity;Impaired UE functional use;Increased edema   OT Treatment/Interventions: Self-care/ADL training;Therapeutic exercise;Neuromuscular education;DME and/or AE instruction;Therapeutic activities;Cognitive remediation/compensation;Visual/perceptual remediation/compensation;Patient/family education;Balance training    OT Goals(Current goals can be found in the care  plan section) Acute Rehab OT Goals Patient Stated Goal: unable to state at this time, but he was very happy to be up in the chair.  OT Goal Formulation: With patient Time For Goal Achievement: 02/20/16 Potential to Achieve Goals: Good  OT Frequency: Min 3X/week   Barriers to D/C: Other (comment) (unknown but x2 family members  numbers on room wall)          Co-evaluation PT/OT/SLP Co-Evaluation/Treatment: Yes Reason for Co-Treatment: Complexity of the patient's impairments (multi-system involvement);Necessary to address cognition/behavior during functional activity;For patient/therapist safety;To address functional/ADL transfers PT goals addressed during session: Mobility/safety with mobility;Balance;Strengthening/ROM OT goals addressed during session: ADL's and self-care;Strengthening/ROM      End of Session Equipment Utilized During Treatment: Gait belt;Oxygen Nurse Communication: Mobility status;Precautions  Activity Tolerance: Patient tolerated treatment well Patient left: in chair;with call bell/phone within reach;with chair alarm set   Time: 1420-1503 OT Time Calculation (min): 43 min Charges:  OT General Charges $OT Visit: 1 Procedure OT Evaluation $OT Eval Moderate Complexity: 1 Procedure OT Treatments $Self Care/Home Management : 8-22 mins G-Codes:    Boone Master B February 15, 2016, 3:53 PM   Mateo Flow   OTR/L Pager: [phone] Office: [phone] .

## 2016-02-06 NOTE — Progress Notes (Signed)
Modified Barium Swallow Progress Note  Patient Details  Name: [name] MRN: [id number] Date of Birth: 03-13-1960  Today's Date: 02/06/2016  Modified Barium Swallow completed.  Full report located under Chart Review in the Imaging Section.  Brief recommendations include the following:  Clinical Impression  Labial spillage, delayed transit and intermittent piecemeal pattern describe pt's oral function. Swallow initiation delayed to pyriform sinuses due to decreased sensation without penetration or aspiration with consecutive cup and straw sips and minimal vallecular residue. Upon clinical observation during bedside swallow and following MBS, pt has multiple swallows and intermittent mild throat clears with the appearance of difficulty however pt safe with consecutive larger sips. Risk for pocketing present therefore recommend Dys 2 texture and thin liquids, crush meds, straws allowed, sit upright and check right side oral cavity for pocketing. ST will continue to follow.       Swallow Evaluation Recommendations       SLP Diet Recommendations: Dysphagia 2 (Fine chop) solids;Thin liquid   Liquid Administration via: Straw;Cup   Medication Administration: Crushed with puree   Supervision: Staff to assist with self feeding;Patient able to self feed;Full supervision/cueing for compensatory strategies   Compensations: Slow rate;Small sips/bites;Minimize environmental distractions;Lingual sweep for clearance of pocketing   Postural Changes: Seated upright at 90 degrees   Oral Care Recommendations: Oral care BID        Royce Macadamia 02/06/2016,11:15 AM   Breck Coons Lonell Face.Ed [employer] [phone]

## 2016-02-06 NOTE — Progress Notes (Signed)
**  Preliminary report by tech**  Bilateral lower extremity venous duplex completed. There is no evidence of deep or superficial vein thrombosis involving the right and left lower extremities. All visualized vessels appear patent and compressible. There is no evidence of Baker's cysts bilaterally.  02/06/16 12:00 PM Olen Cordial RVT

## 2016-02-06 NOTE — Progress Notes (Signed)
Patient seen and examined  Admitted for Slurred speech, confusion, lethargy, right facial droop   56 y.o. male with medical history significant for chronic CHF, insulin-dependent diabetes mellitus, hypertension, depression, ulcerative colitis, prior CVA with minimal residual right-sided weakness, and obstructive sleep apnea who presents to the emergency department with dysarthria, confusion, lethargy, and right facial droop. Medical admission for further evaluation and management of acute CVA was advised. Patient will be admitted to the telemetry unit for ongoing evaluation and management of acute CVA.  MRI brain shows Acute small to moderate LEFT frontal lobe/ MCA territory infarct corresponding to perfusion abnormality. Aspirin 325 mg preadmission, add Plavix? Currently on aspirin and statin Complete stroke workup

## 2016-02-06 NOTE — Evaluation (Signed)
Clinical/Bedside Swallow Evaluation Patient Details  Name: [name] MRN: [id number] Date of Birth: Feb 03, 1961  Today's Date: 02/06/2016 Time: SLP Start Time (ACUTE ONLY): 0827 SLP Stop Time (ACUTE ONLY): 0839 SLP Time Calculation (min) (ACUTE ONLY): 12 min  Past Medical History:  Past Medical History:  Diagnosis Date  . BPH (benign prostatic hyperplasia)   . CHF (congestive heart failure) (HCC)   . Diabetes mellitus without complication (HCC)   . Diabetic retinopathy associated with type 2 diabetes mellitus (HCC)   . Hypertension   . Lymphedema   . OSA (obstructive sleep apnea)   . TIA (transient ischemic attack)    Past Surgical History:  Past Surgical History:  Procedure Laterality Date  . BLADDER SURGERY    . CARDIAC CATHETERIZATION     HPI:  [name] a 56 y.o.malewith medical history significant for chronic CHF, insulin-dependent diabetes mellitus, hypertension, depression, OSA, ulcerative colitis, prior CVA with minimal residual right-sided weakness who presents to the emergency department with dysarthria, confusion, lethargy, and right facial droop. Patient lives alone and family resides in [facility], [place] and [place]. Chest x-ray is notable for new cardiomegaly with pulmonary vascular congestion and slight bilateral interstitial edema. MRI acute small to moderate LEFT frontal lobe/ MCA territory infarct corresponding to perfusion abnormality. 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 Old basal ganglia   Assessment / Plan / Recommendation Clinical Impression  Labial residue, prolonged oral transit characterize oral phase. Suspect pharyngeal dysphagia based with consumed textures and increased risk due to suspected motor deficits, lethargy and dysarthria. Continue NPO until MBS planned for 10:00.    Aspiration Risk  Moderate aspiration risk    Diet Recommendation NPO        Other  Recommendations Oral Care Recommendations: Oral care QID   Follow  up Recommendations  (TBD)      Frequency and Duration            Prognosis        Swallow Study   General HPI: [name] a 56 y.o.malewith medical history significant for chronic CHF, insulin-dependent diabetes mellitus, hypertension, depression, OSA, ulcerative colitis, prior CVA with minimal residual right-sided weakness who presents to the emergency department with dysarthria, confusion, lethargy, and right facial droop. Patient lives alone and family resides in [facility], [place] and [place]. Chest x-ray is notable for new cardiomegaly with pulmonary vascular congestion and slight bilateral interstitial edema. MRI acute small to moderate LEFT frontal lobe/ MCA territory infarct corresponding to perfusion abnormality. 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 Old basal ganglia Type of Study: Bedside Swallow Evaluation Previous Swallow Assessment:  (none) Diet Prior to this Study: NPO Temperature Spikes Noted: No Respiratory Status: Nasal cannula History of Recent Intubation: No Behavior/Cognition: Lethargic/Drowsy;Cooperative;Pleasant mood;Requires cueing Oral Cavity Assessment: Within Functional Limits Oral Care Completed by SLP: No Oral Cavity - Dentition: Adequate natural dentition Vision: Functional for self-feeding Self-Feeding Abilities: Needs set up;Needs assist Patient Positioning: Upright in bed Baseline Vocal Quality: Low vocal intensity Volitional Cough: Cognitively unable to elicit Volitional Swallow: Unable to elicit    Oral/Motor/Sensory Function Overall Oral Motor/Sensory Function: Mild impairment Facial ROM: Reduced right Facial Symmetry: Abnormal symmetry right Facial Strength: Reduced right Lingual ROM:  (decreased) Lingual Symmetry:  (unable to protrude) Lingual Strength: Reduced   Ice Chips Ice chips: Not tested   Thin Liquid Thin Liquid: Impaired Presentation: Cup Oral Phase Impairments: Reduced labial seal Oral Phase Functional  Implications: Right anterior spillage;Prolonged oral transit Pharyngeal  Phase Impairments: Suspected delayed Swallow;Throat Clearing -  Immediate    Nectar Thick Nectar Thick Liquid: Not tested   Honey Thick Honey Thick Liquid: Not tested   Puree Puree: Impaired Oral Phase Functional Implications: Prolonged oral transit Pharyngeal Phase Impairments: Suspected delayed Swallow   Solid   GO   Solid: Not tested        [name] 02/06/2016,8:50 AM  Breck Coons Lonell Face.Ed [employer] [phone]

## 2016-02-06 NOTE — Evaluation (Signed)
Speech Language Pathology Evaluation Patient Details Name: [name] MRN: [id number] DOB: Feb 18, 1960 Today's Date: 02/06/2016 Time: 4680-3212 SLP Time Calculation (min) (ACUTE ONLY): 8 min  Problem List:  Patient Active Problem List   Diagnosis Date Noted  . Cerebral infarction due to occlusion of left middle cerebral artery (HCC)   . Hypertension   . Right sided weakness   . Acute on chronic congestive heart failure (HCC)   . Stroke (cerebrum) (HCC) 02/05/2016  . Hypokalemia 02/05/2016  . Hypertensive urgency 02/05/2016  . Acute CVA (cerebrovascular accident) (HCC) 02/05/2016  . Anemia of chronic disease 05/21/2015  . Diabetes mellitus, type II, insulin dependent (HCC) 05/21/2015  . OSA ? compliacne 05/21/2015  . Homelessness 05/21/2015  . Weakness   . AKI (acute kidney injury) (HCC) 05/20/2015  . Essential hypertension 05/20/2015  . Acute on chronic diastolic CHF (congestive heart failure) (HCC) 05/20/2015  . Weakness of right lower extremity 05/20/2015   Past Medical History:  Past Medical History:  Diagnosis Date  . BPH (benign prostatic hyperplasia)   . CHF (congestive heart failure) (HCC)   . Diabetes mellitus without complication (HCC)   . Diabetic retinopathy associated with type 2 diabetes mellitus (HCC)   . Hypertension   . Lymphedema   . OSA (obstructive sleep apnea)   . TIA (transient ischemic attack)    Past Surgical History:  Past Surgical History:  Procedure Laterality Date  . BLADDER SURGERY    . CARDIAC CATHETERIZATION     HPI:  [name] a 56 y.o.malewith medical history significant for chronic CHF, insulin-dependent diabetes mellitus, hypertension, depression, OSA, ulcerative colitis, prior CVA with minimal residual right-sided weakness who presents to the emergency department with dysarthria, confusion, lethargy, and right facial droop. Patient lives alone and family resides in [facility], [place] and [place]. Chest x-ray is notable for  new cardiomegaly with pulmonary vascular congestion and slight bilateral interstitial edema. MRI acute small to moderate LEFT frontal lobe/ MCA territory infarct corresponding to perfusion abnormality. 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 Old basal ganglia.    Assessment / Plan / Recommendation Clinical Impression  Moderate-severe dysarthria marked by decreased vocal intensity and questionable resonance dysfunction. Right inattention observed. Pt's requires extra time to respond due to delayed processing. Language abilities appeared mostly intact during this assessment. SLP will continue to assess in diagnostic therapy. He would benefit from continued ST on inpatient rehab (will see while on acute care).    SLP Assessment  Patient needs continued Speech Lanaguage Pathology Services    Follow Up Recommendations  Inpatient Rehab    Frequency and Duration min 2x/week  2 weeks      SLP Evaluation Cognition  Overall Cognitive Status: Impaired/Different from baseline Arousal/Alertness: Awake/alert (somewhat drowsy) Orientation Level: Oriented to person;Oriented to place;Oriented to situation Attention: Sustained Sustained Attention: Impaired Sustained Attention Impairment: Verbal basic;Functional basic Memory:  (will assess further) Awareness: Impaired Awareness Impairment: Intellectual impairment;Emergent impairment;Anticipatory impairment Problem Solving: Impaired Problem Solving Impairment: Verbal basic;Functional basic Safety/Judgment: Impaired       Comprehension  Auditory Comprehension Overall Auditory Comprehension:  (functional for basic ) Commands:  (functional for basic one step) Interfering Components: Attention;Visual impairments EffectiveTechniques: Extra processing time;Increased volume Visual Recognition/Discrimination Discrimination: Not tested Reading Comprehension Reading Status:  (TBA)    Expression Expression Primary Mode of Expression:  Verbal Verbal Expression Overall Verbal Expression: Impaired Initiation: Impaired Level of Generative/Spontaneous Verbalization: Phrase Repetition:  (TBA) Naming: Not tested Pragmatics: Impairment Impairments: Abnormal affect;Eye contact;Monotone Interfering Components: Attention Written Expression Written  Expression:  (TBA)   Oral / Motor  Oral Motor/Sensory Function Overall Oral Motor/Sensory Function: Mild impairment Facial ROM: Reduced right Facial Symmetry: Abnormal symmetry right Facial Strength: Reduced right Lingual ROM:  (decreased) Lingual Symmetry:  (unable to protrude) Lingual Strength: Reduced Motor Speech Overall Motor Speech: Impaired Respiration: Impaired Level of Impairment: Sentence Phonation: Low vocal intensity Resonance:  (questionable ) Articulation: Impaired Level of Impairment: Phrase Intelligibility: Intelligibility reduced Word: 50-74% accurate Phrase: 25-49% accurate Motor Planning: Witnin functional limits Effective Techniques: Increased vocal intensity   GO                    [name] 02/06/2016, 11:54 AM   [name] [name].Ed [employer] [phone]

## 2016-02-06 NOTE — Progress Notes (Signed)
Inpatient Rehabilitation  Speech therapy has evaluated pt. and is recommending IP rehab.  Will await PT/OT evals before recommending an IP Rehab consult.  Please call if questions.  Weldon Picking PT Inpatient Rehab Admissions Coordinator Cell [phone] Office [phone]

## 2016-02-06 NOTE — Progress Notes (Signed)
PT Cancellation Note  Patient Details Name: [name] MRN: [id number] DOB: 01/30/1961   Cancelled Treatment:    Reason Eval/Treat Not Completed: Patient at procedure or test/unavailable.  PT checked on pt twice today, both times he was busy testing.  PT will attempt back a third time if I can, otherwise, eval will be completed tomorrow.    Thanks,    Rollene Rotunda. Coren Crownover, PT, DPT [phone]   02/06/2016, 2:03 PM

## 2016-02-06 NOTE — Evaluation (Signed)
Physical Therapy Evaluation Patient Details Name: [name] MRN: [id number] DOB: 08/02/1960 Today's Date: 02/06/2016   History of Present Illness  56 y.o. male admitted to [facility] on 02/05/16 for right facial droop and right sided weakness found to have a L MCA CVA.  Pt with significant PMHx of TIA, HTN, DM with diabetic retinopathy, and CHF.  Clinical Impression  Pt with left gaze preference and right sided weakness.  He was able to stand and transfer to his strong left side with two person assist and I do believe he can be ambulatory with assist.  He would be an excellent CIR candidate.  I am unsure of his social support at discharge.   PT to follow acutely for deficits listed below.       Follow Up Recommendations CIR    Equipment Recommendations  3in1 (PT);Wheelchair (measurements PT);Wheelchair cushion (measurements PT);Other (comment) (right platform RW? )    Recommendations for Other Services Rehab consult     Precautions / Restrictions Precautions Precautions: Fall      Mobility  Bed Mobility Overal bed mobility: Needs Assistance Bed Mobility: Supine to Sit;Rolling Rolling: Mod assist;Min assist   Supine to sit: +2 for safety/equipment;Min assist     General bed mobility comments: Min assist to help progress legs EOB and support trunk during transition to sitting.   Min assist to roll to his right side and mod assist to roll to his left side.   Transfers Overall transfer level: Needs assistance Equipment used: 1 person hand held assist Transfers: Sit to/from [employer] Sit to Stand: +2 physical assistance;Mod assist Stand pivot transfers: +2 safety/equipment;Mod assist       General transfer comment: Two person mod assist to support trunk, anteriorly weight shift, block and stabilize his right leg.  Pt was able to take steps around to the elevated chair to his left by dragging his hyperextended leg behind him.    Ambulation/Gait              General Gait Details: Did not assess today.  Might be safer with third person assisting given his size and his right inattention.       Modified Rankin (Stroke Patients Only) Modified Rankin (Stroke Patients Only) Pre-Morbid Rankin Score: Moderate disability Modified Rankin: Severe disability     Balance Overall balance assessment: Needs assistance Sitting-balance support: Feet supported;Bilateral upper extremity supported Sitting balance-Leahy Scale: Fair Sitting balance - Comments: supervision EOB   Standing balance support: Bilateral upper extremity supported Standing balance-Leahy Scale: Poor Standing balance comment: mod assist in standing.                              Pertinent Vitals/Pain Pain Assessment: No/denies pain Faces Pain Scale: Hurts a little bit Pain Intervention(s): Monitored during session    Home Living Family/patient expects to be discharged to:: Private residence Living Arrangements: Alone   Type of Home: Apartment Home Access: Level entry     Home Layout: One level Home Equipment: Walker - 2 wheels Additional Comments: Unsure of accuracy of hx as pt is giving inconsistant head nods and inconsistant answers to yes/no questions.     Prior Function Level of Independence: Independent with assistive device(s)         Comments: Pt reports he walked with a walker, but not sure how accurate his answers are at this time.         Extremity/Trunk Assessment   Upper Extremity  Assessment Upper Extremity Assessment: Defer to OT evaluation    Lower Extremity Assessment Lower Extremity Assessment: RLE deficits/detail RLE Deficits / Details: right leg at least 3/5 knee extension, but in standing he hyperextends it.  He is able to move it both forward and backward with weakness throughout.      Cervical / Trunk Assessment Cervical / Trunk Assessment: Normal  Communication   Communication: Expressive difficulties  Cognition  Arousal/Alertness: Awake/alert Behavior During Therapy: WFL for tasks assessed/performed Overall Cognitive Status: Impaired/Different from baseline Area of Impairment: Following commands;Safety/judgement;Awareness;Problem solving       Following Commands: Follows one step commands consistently (on the left side) Safety/Judgement: Decreased awareness of safety;Decreased awareness of deficits Awareness: Intellectual Problem Solving: Difficulty sequencing;Requires verbal cues;Requires tactile cues General Comments: Pt with left gaze and right inattention.  Needs cues to come past midline and attend to the right.  He is able to help manage his arm after you help him find it visually.  Not sure of accuracy of y/n questions and family not present to confirm.     General Comments General comments (skin integrity, edema, etc.): BPs stable, pt incontinent in bed at beginning of session.         Assessment/Plan    PT Assessment Patient needs continued PT services  PT Problem List Decreased strength;Decreased activity tolerance;Decreased balance;Decreased mobility;Decreased knowledge of use of DME;Obesity;Decreased safety awareness;Decreased cognition          PT Treatment Interventions DME instruction;Gait training;Functional mobility training;Therapeutic activities;Therapeutic exercise;Balance training;Neuromuscular re-education;Cognitive remediation;Patient/family education;Stair training;Wheelchair mobility training    PT Goals (Current goals can be found in the Care Plan section)  Acute Rehab PT Goals Patient Stated Goal: unable to state at this time, but he was very happy to be up in the chair.  PT Goal Formulation: Patient unable to participate in goal setting Time For Goal Achievement: 02/20/16 Potential to Achieve Goals: Good    Frequency Min 4X/week        Co-evaluation PT/OT/SLP Co-Evaluation/Treatment: Yes Reason for Co-Treatment: For patient/therapist safety;Complexity of  the patient's impairments (multi-system involvement) PT goals addressed during session: Mobility/safety with mobility;Balance;Strengthening/ROM         End of Session Equipment Utilized During Treatment: Gait belt;Oxygen Activity Tolerance: Patient tolerated treatment well Patient left: in chair;with call bell/phone within reach;with chair alarm set           Time: 7829-5621 PT Time Calculation (min) (ACUTE ONLY): 41 min   Charges:   PT Evaluation $PT Eval Moderate Complexity: 1 Procedure PT Treatments $Therapeutic Activity: 8-22 mins        Kimberleigh Mehan B. Bruce Mayers, PT, DPT [phone]   02/06/2016, 3:35 PM

## 2016-02-06 NOTE — Progress Notes (Signed)
STROKE TEAM PROGRESS NOTE   HISTORY OF PRESENT ILLNESS (per record) [name] is a 56 y.o. male with a history of previous stroke with mild right-sided hemiparesis who presents with significant right weakness and aphasia. He is able to indicate that he woke up like this. It is less clear, however, if he had symptoms yesterday or not.  He called a neighbor earlier today and didn't sound right on the phone so the neighbor came and checked on him and called 911.  Given unclear time of onset, he was taken for CT perfusion/angiogram which shows a M3 branch occlusion that appears infarcted. He is therefore not a candidate for IR therapy.  His LKW is unclear, likely last night 02/04/2016 prior to bed. Patient was not administered IV t-PA secondary to being out of window. He was admitted for further evaluation and treatment.   SUBJECTIVE (INTERVAL HISTORY) No family at bedside. Patient sleepy drowsy, but arousable and following some commands. Still aphasic with right-sided weakness. Bilateral pedal edema.   OBJECTIVE Temp:  [97.6 F (36.4 C)-98.5 F (36.9 C)] 97.6 F (36.4 C) (01/03 0700) Pulse Rate:  [70-96] 87 (01/03 0700) Cardiac Rhythm: Normal sinus rhythm (01/02 2111) Resp:  [0-23] 18 (01/03 0700) BP: (176-212)/(110-149) 186/119 (01/03 0700) SpO2:  [95 %-100 %] 100 % (01/03 0700) Weight:  [108.5 kg (239 lb 3.2 oz)-108.9 kg (240 lb 1.3 oz)] 108.5 kg (239 lb 3.2 oz) (01/03 0500)  CBC:  Recent Labs Lab 02/05/16 1531 02/05/16 1532  WBC  --  6.3  NEUTROABS  --  5.0  HGB 12.6* 12.1*  HCT 37.0* 37.0*  MCV  --  87.7  PLT  --  230    Basic Metabolic Panel:  Recent Labs Lab 02/05/16 1532 02/06/16 0429  NA 143 145  K 3.3* 3.8  CL 111 110  CO2 25 24  GLUCOSE 105* 112*  BUN 24* 23*  CREATININE 1.48* 1.45*  CALCIUM 9.1 8.9  MG  --  1.9    Lipid Panel:    Component Value Date/Time   CHOL 157 02/06/2016 0429   TRIG 80 02/06/2016 0429   HDL 47 02/06/2016 0429   CHOLHDL  3.3 02/06/2016 0429   VLDL 16 02/06/2016 0429   LDLCALC 94 02/06/2016 0429   HgbA1c: No results found for: HGBA1C Urine Drug Screen:    Component Value Date/Time   LABOPIA NONE DETECTED 02/05/2016 1624   COCAINSCRNUR NONE DETECTED 02/05/2016 1624   LABBENZ NONE DETECTED 02/05/2016 1624   AMPHETMU NONE DETECTED 02/05/2016 1624   THCU NONE DETECTED 02/05/2016 1624   LABBARB NONE DETECTED 02/05/2016 1624      IMAGING I have personally reviewed the radiological images below and agree with the radiology interpretations.  Ct Head Wo Contrast 02/05/2016 Old left basal ganglia and right occipital infarct. No acute intracranial abnormality.   Ct Angio Head W Or Wo Contrast Ct Angio Neck W Or Wo Contrast 02/05/2016 1. 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 2. Scattered atheromatous plaque throughout the carotid siphons with mild to moderate multifocal narrowing. 3. Atheromatous plaque about the carotid bifurcations bilaterally without flow-limiting stenosis. 4. Large layering right pleural effusion, with smaller left pleural effusion. Interlobular septal thickening suggestive of pulmonary edema.   Ct Cerebral Perfusion W Contrast 02/05/2016 Acute small to moderate-sized left MCA territory infarct   Kyle Brain Wo Contrast 02/06/2016 Acute small to moderate LEFT frontal lobe/ MCA territory infarct corresponding to perfusion  abnormality. 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 Old basal ganglia and pontomedullary lacunar infarcts. Old RIGHT occipital lobe/ PCA territory infarct. Mild to moderate chronic small vessel ischemic disease. Mild atrophy.   Dg Chest Port 1 View 02/05/2016 New cardiomegaly with pulmonary vascular congestion and slight bilateral interstitial pulmonary edema.   LE venous Doppler - negative for DVT  TTE pending   PHYSICAL EXAM  Temp:  [97.6 F (36.4 C)-98.9 F (37.2 C)] 98.9 F (37.2 C)  (01/03 1526) Pulse Rate:  [68-87] 68 (01/03 1526) Resp:  [0-23] 18 (01/03 0700) BP: (166-212)/(101-136) 166/112 (01/03 1526) SpO2:  [97 %-100 %] 100 % (01/03 1526) Weight:  [239 lb 3.2 oz (108.5 kg)-240 lb 1.3 oz (108.9 kg)] 239 lb 3.2 oz (108.5 kg) (01/03 0500)  General - Well nourished, well developed, sleepy and drowsy, but arousable.  Ophthalmologic - Fundi not visualized due to noncooperation.  Cardiovascular - Regular rate and rhythm.  Neuro - Drowsy and sleepy, but arousable, following some simple commands. Expressive aphasia, not able to name or repeat. Limited bilateral gaze, left gaze preference, able to cross midline, questionable for right neglect. PERRL, blinking to visual threat inconsistent bilaterally. Right facial droop. Tongue at right side in mouth. LUE 5/5, RUE 3/5, BLE 3/5 with limited DF and PF bilaterally. DTR 1+ and no babinski. Sensation, coordination and gait not tested.   ASSESSMENT/PLAN Kyle. Yardley Elliott is a 56 y.o. male with history of previous stroke with mild right-sided hemiparesis  presenting with R sided weakness. He did not receive IV t-PA due to being outside the window.   Stroke:  Dominant left frontal/MCA territory infarct embolic pattern secondary to unknown source. Patient does have multiple stroke risk factors, however, cardiac embolic infarct cannot be ruled out.  Resultant  expressive aphasia, right hemiparesis  CT old L BG and R occipital infarcts  CTA head and neck L M3 occlusion, R pleural effusion, pulmonary edema.  CTP small to moderate size L MCA territory infarct  MRI  Small to moderate L frontal/MCA territory infarct. Old B BG, pontomedullary, R occipital/PCA infarcts.  2D Echo  pending  LE doppler no DVT  Consider TEE and Loop recorder once patient more stabilized  LDL 94  HgbA1c pending  Lovenox 40 mg sq daily for VTE prophylaxis  Diet NPO time specified  No antithrombotic prior to admission, now on aspirin 300 mg  suppository daily. Due to intracranial stenosis, recommend DAPT with aspirin and Plavix for 3 months and then Plavix alone.  Patient counseled to be compliant with his antithrombotic medications  Ongoing aggressive stroke risk factor management  Therapy recommendations:  pending   Disposition:  pending   Hx of stroke -   03/2014 pontine infarct   started on Aggrenox   A1c 13.7 and LDL 97   residual right-sided weakness and slurred speech  Hypertension Hypertensive Urgency  BP as high as 212/124 on arrival in setting of neurologic symptoms  Remains elevated this am at 186/119  Permissive hypertension (OK if < 220/120) but gradually normalize in 5-7 days  Receiving prn labetolol  Long-term BP goal normotensive  Hyperlipidemia  Home meds:  lipitor 40, resumed in hospital  LDL 94, goal < 70  Continue statin at discharge  Diabetes type II  HgbA1c pending, goal < 7.0  On Lantus  SSI  CBG monitoring  Other Stroke Risk Factors  Obesity, Body mass index is 33.36 kg/m., recommend weight loss, diet and exercise as appropriate   Obstructive sleep apnea  CHF -  on Lasix  Acute on chronic diastolic CHF  Other Active Problems  Hypokalemia  Hospital day # 1  Marvel Plan, MD PhD Stroke Neurology 02/06/2016 6:22 PM    To contact Stroke Continuity provider, please refer to [email or web address]. After hours, contact General Neurology

## 2016-02-07 DIAGNOSIS — I472 Ventricular tachycardia: Secondary | ICD-10-CM

## 2016-02-07 DIAGNOSIS — I63512 Cerebral infarction due to unspecified occlusion or stenosis of left middle cerebral artery: Principal | ICD-10-CM

## 2016-02-07 DIAGNOSIS — R531 Weakness: Secondary | ICD-10-CM

## 2016-02-07 DIAGNOSIS — I509 Heart failure, unspecified: Secondary | ICD-10-CM

## 2016-02-07 DIAGNOSIS — I639 Cerebral infarction, unspecified: Secondary | ICD-10-CM

## 2016-02-07 LAB — BASIC METABOLIC PANEL
Anion gap: 5 (ref 5–15)
BUN: 21 mg/dL — AB (ref 6–20)
CO2: 28 mmol/L (ref 22–32)
CREATININE: 1.58 mg/dL — AB (ref 0.61–1.24)
Calcium: 8.7 mg/dL — ABNORMAL LOW (ref 8.9–10.3)
Chloride: 110 mmol/L (ref 101–111)
GFR calc non Af Amer: 48 mL/min — ABNORMAL LOW (ref >60)
GFR, EST AFRICAN AMERICAN: 56 mL/min — AB (ref >60)
Glucose, Bld: 70 mg/dL (ref 65–99)
Potassium: 3.4 mmol/L — ABNORMAL LOW (ref 3.5–5.1)
Sodium: 143 mmol/L (ref 135–145)

## 2016-02-07 LAB — GLUCOSE, CAPILLARY
GLUCOSE-CAPILLARY: 78 mg/dL (ref 65–99)
GLUCOSE-CAPILLARY: 122 mg/dL — AB (ref 65–99)
Glucose-Capillary: 66 mg/dL (ref 65–99)
Glucose-Capillary: 67 mg/dL (ref 65–99)
Glucose-Capillary: 110 mg/dL — ABNORMAL HIGH (ref 65–99)
Glucose-Capillary: 125 mg/dL — ABNORMAL HIGH (ref 65–99)

## 2016-02-07 LAB — ECHOCARDIOGRAM COMPLETE
HEIGHTINCHES: 71 in
Weight: 3827.19 oz

## 2016-02-07 LAB — HEMOGLOBIN A1C
Hgb A1c MFr Bld: 6.4 % — ABNORMAL HIGH (ref 4.8–5.6)
Mean Plasma Glucose: 137 mg/dL

## 2016-02-07 MED ORDER — MAGNESIUM OXIDE 400 (241.3 MG) MG PO TABS
400.0000 mg | ORAL_TABLET | Freq: Two times a day (BID) | ORAL | Status: DC
  Administered 2016-02-07 – 2016-02-11 (×7): 400 mg via ORAL
  Filled 2016-02-07 (×9): qty 1

## 2016-02-07 MED ORDER — POTASSIUM CHLORIDE CRYS ER 20 MEQ PO TBCR
40.0000 meq | EXTENDED_RELEASE_TABLET | Freq: Every day | ORAL | Status: DC
  Administered 2016-02-07: 40 meq via ORAL
  Filled 2016-02-07 (×3): qty 2

## 2016-02-07 MED ORDER — FUROSEMIDE 40 MG PO TABS: 60.0000 mg | ORAL_TABLET | Freq: Two times a day (BID) | ORAL | Status: DC

## 2016-02-07 MED ORDER — INSULIN GLARGINE 100 UNIT/ML ~~LOC~~ SOLN
12.0000 [IU] | Freq: Every day | SUBCUTANEOUS | Status: DC
  Filled 2016-02-07 (×2): qty 0.12

## 2016-02-07 MED ORDER — CARVEDILOL 12.5 MG PO TABS
50.0000 mg | ORAL_TABLET | Freq: Two times a day (BID) | ORAL | Status: DC
  Administered 2016-02-07 – 2016-02-11 (×6): 50 mg via ORAL
  Filled 2016-02-07 (×2): qty 4
  Filled 2016-02-07: qty 8
  Filled 2016-02-07 (×6): qty 4

## 2016-02-07 MED ORDER — FUROSEMIDE 10 MG/ML IJ SOLN
80.0000 mg | Freq: Two times a day (BID) | INTRAMUSCULAR | Status: DC
  Administered 2016-02-07 – 2016-02-11 (×7): 80 mg via INTRAVENOUS
  Filled 2016-02-07 (×7): qty 8

## 2016-02-07 MED ORDER — FUROSEMIDE 40 MG PO TABS
60.0000 mg | ORAL_TABLET | Freq: Every day | ORAL | Status: DC
  Administered 2016-02-07: 09:00:00 60 mg via ORAL
  Filled 2016-02-07: qty 1

## 2016-02-07 NOTE — Consult Note (Signed)
CARDIOLOGY CONSULT NOTE   Patient ID: [name] MRN: [id number] DOB/AGE: [birth year] 56 y.o.  Admit date: 02/05/2016  Requesting Physician: Dr. Susie Cassette Primary Physician:   [facility] Primary Cardiologist: New, Dr. Anne Fu  Reason for Consultation: Newly reduced EF  HPI: [name] is a 56 y.o. male with a history of chronic diastolic CHF (EF 40-98% in 04/2015), DMT2, HTN, ulcerative colitis, OSA on CPAP, prior CVA and depression who presented to [facility] on 02/05/15 with slurred speech, confusion and facial droop. He was diagnosed with acute CVA. 2D ECHO in stroke w/u revealed newly reduced EF to 35% and cardiology consulted.   2D ECHO in 04/2015 at [facility] showed EF 50-55%, severe concentric LVH, G2DD, severe LAE, mod RAE,  Mod-severe TR and mod pulm HTN, possible bicuspid AV with mild AR. He has been admitted for acute CHF in the past. Home meds included Lasix 40mg  QOD, spiro 50mg  daily, metolazone 5mg  q 3 days, Bumex 2mg  BID, amlodipine 10mg  daily, Coreg 25mg  BID, atorvastatin 40mg  daily, ASA 325mg  daily, hydralazine 100mg  TID, lisinopril 10mg  daily.  He was in his usual state of health until 02/05/15 when he was talking to his sister on the phone and she noted slurred speech and then dropped the phone. She called her brother in [facility] who called EMS. Upon EMS arrival, he was significantly dysarthric and with right arm weakness and right facial droop. He was brought to [facility] for further work up.   In the ER he was hypertensive to 200/140. EKG showed sinus rhythm with LAFB and LVH by voltage criteria with repolarization abnormality. BNP 3370. Chest x-ray notable for new cardiomegaly with pulmonary vascular congestion and slight bilateral interstitial edema. He was volume overloaded on exam. Noncontrast head CT was obtained and featured old left basal ganglia and right occipital infarcts, but no acute intracranial abnormality. CTA of head and neck demonstrated an acute left M3  occlusion with evolving left MCA infarct as well as other scattered abnormalities. MRI brain showed acute small to moderate LEFT frontal lobe/ MCA territory infarct  He was given IV Lasix 80mg  BID x1 day and then restarted on lasix 60mg  BID. He was allowed permissive HTN given acute ischemic CVA. 2D ECHO showed EF 35%, severe LVH, diffuse HK, G2DD, trivial AR/MR, trivial MR, severe LAE, mod RV systolic dysfunction, mod RAE, dilated IVC. Cardiology consulted for help with management.    Patient is aphasic but can follow commands. No CP or SOB. ++ LE edema, but no orthopnea or PND. No dizziness or syncope. No blood in stool or urine. No palpitations. (all with nods yes and no).  He agrees to do a TEE   Past Medical History:  Diagnosis Date  . BPH (benign prostatic hyperplasia)   . CHF (congestive heart failure) (HCC)   . Diabetes mellitus without complication (HCC)   . Diabetic retinopathy associated with type 2 diabetes mellitus (HCC)   . Hypertension   . Lymphedema   . OSA (obstructive sleep apnea)   . TIA (transient ischemic attack)      Past Surgical History:  Procedure Laterality Date  . BLADDER SURGERY    . CARDIAC CATHETERIZATION      Allergies  Allergen Reactions  . Chicken Allergy Other (See Comments)    Sneezing   . Sildenafil Other (See Comments)    Per the [facility], this is possibly an allergy    I have reviewed the patient's current medications . aspirin EC  325 mg  Oral Daily   Or  . aspirin  300 mg Rectal Daily  . atorvastatin  20 mg Oral QHS  . calcium-vitamin D  1 tablet Oral Daily  . citalopram  40 mg Oral Daily  . clopidogrel  75 mg Oral Daily  . doxazosin  4 mg Oral QHS  . enoxaparin (LOVENOX) injection  40 mg Subcutaneous Q24H  . furosemide  60 mg Oral BID  . insulin aspart  0-9 Units Subcutaneous Q4H  . insulin glargine  12 Units Subcutaneous QHS  . isosorbide mononitrate  30 mg Oral Daily  . magnesium oxide  400 mg Oral BID  . multivitamin with minerals   1 tablet Oral Daily  . potassium chloride  40 mEq Oral Daily  . sodium chloride flush  3 mL Intravenous Q12H  . sulfaSALAzine  2,000 mg Oral BID  . tamsulosin  0.4 mg Oral QHS    sodium chloride, acetaminophen, docusate sodium, labetalol, ondansetron (ZOFRAN) IV, polyethylene glycol, sodium chloride flush  Prior to Admission medications   Medication Sig Start Date End Date Taking? Authorizing Provider  acetaminophen (TYLENOL) 325 MG tablet Take 650 mg by mouth 3 (three) times daily as needed for mild pain, moderate pain or headache.    Yes Historical Provider, MD  amLODipine (NORVASC) 10 MG tablet Take 10 mg by mouth daily.   Yes Historical Provider, MD  aspirin 325 MG tablet Take 325 mg by mouth daily.   Yes Historical Provider, MD  atorvastatin (LIPITOR) 40 MG tablet Take 20 mg by mouth at bedtime.    Yes Historical Provider, MD  bumetanide (BUMEX) 2 MG tablet Take 2 mg by mouth 2 (two) times daily.   Yes Historical Provider, MD  Calcium Carbonate-Vitamin D (CALCIUM-VITAMIN D) 500-200 MG-UNIT tablet Take 1 tablet by mouth daily.    Yes Historical Provider, MD  carvedilol (COREG) 25 MG tablet Take 50 mg by mouth 2 (two) times daily.    Yes Historical Provider, MD  citalopram (CELEXA) 40 MG tablet Take 40 mg by mouth daily.   Yes Historical Provider, MD  doxazosin (CARDURA) 8 MG tablet Take 4 mg by mouth at bedtime.    Yes Historical Provider, MD  ferrous sulfate 325 (65 FE) MG tablet Take 325 mg by mouth every Monday, Wednesday, and Friday.    Yes Historical Provider, MD  hydrALAZINE (APRESOLINE) 100 MG tablet Take 100 mg by mouth 3 (three) times daily.   Yes Historical Provider, MD  insulin aspart (NOVOLOG) 100 UNIT/ML injection Inject 3 Units into the skin 3 (three) times daily before meals.    Yes Historical Provider, MD  insulin glargine (LANTUS) 100 UNIT/ML injection Inject 18 Units into the skin at bedtime.    Yes Historical Provider, MD  lisinopril (PRINIVIL,ZESTRIL) 10 MG tablet  Take 10 mg by mouth daily.   Yes Historical Provider, MD  oxybutynin (DITROPAN) 5 MG tablet Take 5 mg by mouth 3 (three) times daily.   Yes Historical Provider, MD  pantoprazole (PROTONIX) 40 MG tablet Take 40 mg by mouth daily. Take on an empty stomach 30 minutes before a meal   Yes Historical Provider, MD  polyethylene glycol (MIRALAX / GLYCOLAX) packet Take 17 g by mouth daily as needed for mild constipation or moderate constipation.   Yes Historical Provider, MD  potassium chloride SA (K-DUR,KLOR-CON) 20 MEQ tablet Take 20 mEq by mouth daily.   Yes Historical Provider, MD  spironolactone (ALDACTONE) 25 MG tablet Take 50 mg by mouth daily.  Yes Historical Provider, MD  sulfaSALAzine (AZULFIDINE) 500 MG tablet Take 2,000 mg by mouth 2 (two) times daily after a meal.    Yes Historical Provider, MD  tamsulosin (FLOMAX) 0.4 MG CAPS capsule Take 0.4 mg by mouth at bedtime.    Yes Historical Provider, MD  docusate sodium (COLACE) 100 MG capsule Take 100 mg by mouth daily as needed for mild constipation.    Historical Provider, MD  furosemide (LASIX) 40 MG tablet Take 1 tablet (40 mg total) by mouth every other day. Patient not taking: Reported on 02/06/2016 05/22/15   Rhetta Mura, MD  hydrocerin (EUCERIN) CREA Apply 1 application topically daily.    Historical Provider, MD  ibuprofen (ADVIL,MOTRIN) 800 MG tablet Take 800 mg by mouth 3 (three) times daily as needed for headache or moderate pain (with food).    Historical Provider, MD  ipratropium (ATROVENT) 0.02 % nebulizer solution Take 2.5 mLs (0.5 mg total) by nebulization every 2 (two) hours as needed for wheezing or shortness of breath. Patient not taking: Reported on 02/06/2016 05/22/15   Rhetta Mura, MD  isosorbide mononitrate (IMDUR) 30 MG 24 hr tablet Take 30 mg by mouth daily.    Historical Provider, MD  lisinopril (PRINIVIL,ZESTRIL) 5 MG tablet Take 1 tablet (5 mg total) by mouth daily. Patient not taking: Reported on 02/06/2016  05/22/15   Rhetta Mura, MD  loperamide (IMODIUM) 2 MG capsule Take 1 capsule (2 mg total) by mouth 4 (four) times daily as needed for diarrhea or loose stools. Patient not taking: Reported on 02/06/2016 05/30/15   Barrett Henle, PA-C  metolazone (ZAROXOLYN) 5 MG tablet Take 5 mg by mouth every 3 (three) days.    Historical Provider, MD  Multiple Vitamin (MULTIVITAMIN WITH MINERALS) TABS tablet Take 1 tablet by mouth daily.    Historical Provider, MD  ondansetron (ZOFRAN ODT) 4 MG disintegrating tablet Take 1 tablet (4 mg total) by mouth every 8 (eight) hours as needed for nausea or vomiting. Patient not taking: Reported on 02/06/2016 05/30/15   Barrett Henle, PA-C  silver sulfADIAZINE (SILVADENE) 1 % cream Apply 1 application topically daily as needed (skin care).     Historical Provider, MD     Social History   Social History  . Marital status: Married    Spouse name: N/A  . Number of children: N/A  . Years of education: N/A   Occupational History  . Not on file.   Social History Main Topics  . Smoking status: Never Smoker  . Smokeless tobacco: Never Used  . Alcohol use No  . Drug use: No  . Sexual activity: Not on file   Other Topics Concern  . Not on file   Social History Narrative  . No narrative on file    Family Status  Relation Status  . Mother Deceased  . Father Deceased   Family History  Problem Relation Age of Onset  . Cancer Mother   . Cancer Father     ROS:  Full 14 point review of systems complete and found to be negative unless listed above.  Physical Exam: Blood pressure (!) 154/106, pulse 63, temperature 97.5 F (36.4 C), temperature source Oral, resp. rate 18, height 5\' 11"  (1.803 m), weight 229 lb 8 oz (104.1 kg), SpO2 100 %.  General: Well developed, well nourished, male in no acute distress, chronically ill appearing.  Head: Eyes PERRLA, No xanthomas.   Normocephalic and atraumatic, oropharynx without edema or exudate. Lungs:  decreased breath  sounds at bases Heart: HRRR S1 S2, no rub/gallop, Heart regular rate and rhythm with S1, S2 no murmur. pulses are 2+ extrem.  Neck: No carotid bruits. No lymphadenopathy. + JVD. Abdomen: Bowel sounds present, abdomen soft and non-tender without masses or hernias noted. Msk:  No spine or cva tenderness. No weakness, no joint deformities or effusions. Extremities: No clubbing or cyanosis. 2+ LE bilateral edema.  Neuro: Alert and oriented X 3. No focal deficits noted. Psych:  Good affect, responds appropriately Skin: No rashes or lesions noted.  Labs:   Lab Results  Component Value Date   WBC 6.3 02/05/2016   HGB 12.1 (L) 02/05/2016   HCT 37.0 (L) 02/05/2016   MCV 87.7 02/05/2016   PLT 230 02/05/2016    Recent Labs  02/05/16 1532  INR 1.18    Recent Labs Lab 02/05/16 1532  02/07/16 0730  NA 143  < > 143  K 3.3*  < > 3.4*  CL 111  < > 110  CO2 25  < > 28  BUN 24*  < > 21*  CREATININE 1.48*  < > 1.58*  CALCIUM 9.1  < > 8.7*  PROT 6.0*  --   --   BILITOT 0.9  --   --   ALKPHOS 227*  --   --   ALT 27  --   --   AST 42*  --   --   GLUCOSE 105*  < > 70  ALBUMIN 2.8*  --   --   < > = values in this interval not displayed. Magnesium  Date Value Ref Range Status  02/06/2016 1.9 1.7 - 2.4 mg/dL Final   No results for input(s): CKTOTAL, CKMB, TROPONINI in the last 72 hours.  Recent Labs  02/05/16 1529  TROPIPOC 0.04   No results found for: PROBNP Lab Results  Component Value Date   CHOL 157 02/06/2016   HDL 47 02/06/2016   LDLCALC 94 02/06/2016   TRIG 80 02/06/2016   No results found for: DDIMER Lipase  Date/Time Value Ref Range Status  05/30/2015 12:09 PM 16 11 - 51 U/L Final   No results found for: TSH, T4TOTAL, T3FREE, THYROIDAB No results found for: VITAMINB12, FOLATE, FERRITIN, TIBC, IRON, RETICCTPCT  Echo: 02/06/2016 LV EF: 35% Study Conclusions - Left ventricle: The cavity size was normal. Wall thickness was   increased in a  pattern of severe LVH. LV mass index 356 g/m2. RWT   of 0.73, suggesting severely abnormal concentric hypertrophy.   Systolic function was moderately to severely reduced. The   estimated ejection fraction was 35%. Diffuse hypokinesis. Doppler   parameters are consistent with pseduonormal left ventricular   relaxation (grade 2 diastolic dysfunction). The E/e&' ratio is   >15, suggesting elevated LV filling pressure. Isovolumic   relaxation time: 126 ms. - Aortic valve: Mildly thickened and sclerotic leaflet tips. No   stenosis. There was trivial regurgitation. - Mitral valve: Mildly thickened leaflets . There was trivial   regurgitation. - Left atrium: Severely dilated. - Right ventricle: The cavity size was normal. Systolic function is   moderately reduced. - Right atrium: Moderately dilated. - Inferior vena cava: The vessel was dilated. The respirophasic   diameter changes were blunted (< 50%), consistent with elevated   central venous pressure. Impressions: - LVEF 35%, severe concentric LVH (IVSd of 2.3 cm), global   hypokinesis, grade 2 DD with high LV filling pressure, sclerotic   aortic leaflet tips without stenosis, trivial AI and MR,  severe   LAE and moderate RAE, moderate RV systolic dysfunction, dilated   IVC, no pericardial effusion.   Radiology:  Ct Angio Head W Or Wo Contrast  Result Date: 02/05/2016 CLINICAL DATA:  Initial evaluation for acute right-sided weakness, slurred speech, aphasia. EXAM: CT ANGIOGRAPHY HEAD AND NECK TECHNIQUE: Multidetector CT imaging of the head and neck was performed using the standard protocol during bolus administration of intravenous contrast. Multiplanar CT image reconstructions and MIPs were obtained to evaluate the vascular anatomy. Carotid stenosis measurements (when applicable) are obtained utilizing NASCET criteria, using the distal internal carotid diameter as the denominator. CONTRAST:  90 cc of Isovue 370. COMPARISON:  Prior CT from  earlier the same day. FINDINGS: CT HEAD FINDINGS Brain: Atrophy with chronic microvascular ischemic disease again noted. Remote lacunar infarcts within the bilateral basal ganglia. Now evident is subtle evolving hypodensity involving the left insular cortex extending towards the left frontal operculum (series 201, image 18). This is more evident as compared to previous examination performed earlier the same day. No significant mass effect. No acute intracranial hemorrhage. Hyperdensity at the level of the left sylvian fissure suspicious for thrombus (series 201, image 19). No mass lesion, midline shift, or mass effect. No hydrocephalus. No extra-axial fluid collection. Vascular: Vascular calcifications within the carotid siphons. Hyperdensity at the level the left sylvian fissure suspicious for thrombus (series 201, image 19). No other hyperdense vessel. Skull: Scalp soft tissues within normal limits.  Calvarium intact. Sinuses: Globes and orbital soft tissues within normal limits. Patient is status post scleral banding on the left. Mild opacity within the right ethmoidal air cells. Paranasal sinuses are otherwise clear. No mastoid effusion. CTA NECK FINDINGS Aortic arch: Visualized aortic arch of normal caliber with normal branch pattern. Scattered atheromatous plaque within the arch itself and about the origin of the great vessels without flow-limiting stenosis. Visualized subclavian arteries are widely patent. Right carotid system: Right common carotid artery patent from its origin to the bifurcation. Scattered calcified plaque about the right bifurcation without flow-limiting stenosis. Right ICA patent distally to the skullbase without stenosis, dissection, or occlusion. Left carotid system: Left common carotid artery patent from its origin to the bifurcation. Scattered calcified plaque about the left bifurcation without flow-limiting stenosis. Left ICA patent distally to the skullbase without stenosis,  dissection, or occlusion. Vertebral arteries: Both of the vertebral arteries arise from the subclavian arteries. The the vertebral arteries widely patent without stenosis, dissection, or occlusion. Focal plaque noted at the origin of the right vertebral artery without high-grade stenosis. Skeleton: No acute osseous abnormality. No worrisome lytic or blastic osseous lesions. Other neck: Visualized soft tissues of the neck demonstrate no acute abnormality. Upper chest: Visualized upper mediastinum within normal limits. Layering right pleural effusion. Probable smaller left effusion. Scattered interlobular septal thickening within the partially visualized lungs, suggesting edema. Review of the MIP images confirms the above findings CTA HEAD FINDINGS Anterior circulation: Petrous segments patent bilaterally. Scattered calcified atheromatous plaque within the cavernous/ supraclinoid ICAs with mild to moderate multifocal narrowing. ICA termini patent. Left A1 segment dominant and widely patent. Right A1 segment hypoplastic but patent as well. Anterior communicating artery normal. Anterior cerebral arteries patent to their distal aspects. Right M1 segment patent without stenosis or occlusion. Right MCA bifurcation normal. Right MCA branches well opacified to their distal aspects. Left M1 segment patent without stenosis or occlusion. Left MCA bifurcation normal. No proximal M2 branch occlusion. There is abrupt occlusion of a left M3 branch, middle division (series 602, image  80). This corresponds with previously seen hyperdensity on noncontrast portion of this exam. No other arterial branch occlusion. Left MCA branches otherwise patent. Posterior circulation: Scattered plaque within the V4 segments bilaterally, right greater than left with mild multifocal narrowing. Posterior inferior cerebral arteries patent bilaterally. Basilar artery widely patent. Superior cerebellar arteries patent bilaterally. Both of the posterior  cerebral arteries supplied mainly via the basilar artery and are patent to their distal aspects. Venous sinuses: Patent. Anatomic variants: No significant anatomic variant. No aneurysm or vascular malformation. Delayed phase: Not performed. Review of the MIP images confirms the above findings IMPRESSION: 1. 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 2. Scattered atheromatous plaque throughout the carotid siphons with mild to moderate multifocal narrowing. 3. Atheromatous plaque about the carotid bifurcations bilaterally without flow-limiting stenosis. 4. Large layering right pleural effusion, with smaller left pleural effusion. Interlobular septal thickening suggestive of pulmonary edema. Critical Value/emergent results were called by telephone at the time of interpretation on 02/05/2016 at 6:19 pm to Dr. Ritta Slot , who verbally acknowledged these results. Electronically Signed   By: Rise Mu M.D.   On: 02/05/2016 19:04   Ct Head Wo Contrast  Result Date: 02/05/2016 CLINICAL DATA:  Altered mental status, aphasia, right-sided weakness EXAM: CT HEAD WITHOUT CONTRAST TECHNIQUE: Contiguous axial images were obtained from the base of the skull through the vertex without intravenous contrast. COMPARISON:  MRI 05/19/2000 FINDINGS: Brain: Old lacunar infarct in the left basal ganglia. Old infarct and encephalomalacia in the right occipital lobe. No acute intracranial abnormality. Specifically, no hemorrhage, hydrocephalus, mass lesion, acute infarction, or significant intracranial injury. Vascular: No hyperdense vessel or unexpected calcification. Skull: No acute calvarial abnormality. Sinuses/Orbits: Visualized paranasal sinuses and mastoids clear. Orbital soft tissues unremarkable. Other: None IMPRESSION: Old left basal ganglia and right occipital infarct. No acute intracranial abnormality. Electronically Signed   By: Charlett Nose  M.D.   On: 02/05/2016 15:25   Ct Angio Neck W Or Wo Contrast  Result Date: 02/05/2016 CLINICAL DATA:  Initial evaluation for acute right-sided weakness, slurred speech, aphasia. EXAM: CT ANGIOGRAPHY HEAD AND NECK TECHNIQUE: Multidetector CT imaging of the head and neck was performed using the standard protocol during bolus administration of intravenous contrast. Multiplanar CT image reconstructions and MIPs were obtained to evaluate the vascular anatomy. Carotid stenosis measurements (when applicable) are obtained utilizing NASCET criteria, using the distal internal carotid diameter as the denominator. CONTRAST:  90 cc of Isovue 370. COMPARISON:  Prior CT from earlier the same day. FINDINGS: CT HEAD FINDINGS Brain: Atrophy with chronic microvascular ischemic disease again noted. Remote lacunar infarcts within the bilateral basal ganglia. Now evident is subtle evolving hypodensity involving the left insular cortex extending towards the left frontal operculum (series 201, image 18). This is more evident as compared to previous examination performed earlier the same day. No significant mass effect. No acute intracranial hemorrhage. Hyperdensity at the level of the left sylvian fissure suspicious for thrombus (series 201, image 19). No mass lesion, midline shift, or mass effect. No hydrocephalus. No extra-axial fluid collection. Vascular: Vascular calcifications within the carotid siphons. Hyperdensity at the level the left sylvian fissure suspicious for thrombus (series 201, image 19). No other hyperdense vessel. Skull: Scalp soft tissues within normal limits.  Calvarium intact. Sinuses: Globes and orbital soft tissues within normal limits. Patient is status post scleral banding on the left. Mild opacity within the right ethmoidal air cells. Paranasal sinuses are otherwise  clear. No mastoid effusion. CTA NECK FINDINGS Aortic arch: Visualized aortic arch of normal caliber with normal branch pattern. Scattered  atheromatous plaque within the arch itself and about the origin of the great vessels without flow-limiting stenosis. Visualized subclavian arteries are widely patent. Right carotid system: Right common carotid artery patent from its origin to the bifurcation. Scattered calcified plaque about the right bifurcation without flow-limiting stenosis. Right ICA patent distally to the skullbase without stenosis, dissection, or occlusion. Left carotid system: Left common carotid artery patent from its origin to the bifurcation. Scattered calcified plaque about the left bifurcation without flow-limiting stenosis. Left ICA patent distally to the skullbase without stenosis, dissection, or occlusion. Vertebral arteries: Both of the vertebral arteries arise from the subclavian arteries. The the vertebral arteries widely patent without stenosis, dissection, or occlusion. Focal plaque noted at the origin of the right vertebral artery without high-grade stenosis. Skeleton: No acute osseous abnormality. No worrisome lytic or blastic osseous lesions. Other neck: Visualized soft tissues of the neck demonstrate no acute abnormality. Upper chest: Visualized upper mediastinum within normal limits. Layering right pleural effusion. Probable smaller left effusion. Scattered interlobular septal thickening within the partially visualized lungs, suggesting edema. Review of the MIP images confirms the above findings CTA HEAD FINDINGS Anterior circulation: Petrous segments patent bilaterally. Scattered calcified atheromatous plaque within the cavernous/ supraclinoid ICAs with mild to moderate multifocal narrowing. ICA termini patent. Left A1 segment dominant and widely patent. Right A1 segment hypoplastic but patent as well. Anterior communicating artery normal. Anterior cerebral arteries patent to their distal aspects. Right M1 segment patent without stenosis or occlusion. Right MCA bifurcation normal. Right MCA branches well opacified to their  distal aspects. Left M1 segment patent without stenosis or occlusion. Left MCA bifurcation normal. No proximal M2 branch occlusion. There is abrupt occlusion of a left M3 branch, middle division (series 602, image 80). This corresponds with previously seen hyperdensity on noncontrast portion of this exam. No other arterial branch occlusion. Left MCA branches otherwise patent. Posterior circulation: Scattered plaque within the V4 segments bilaterally, right greater than left with mild multifocal narrowing. Posterior inferior cerebral arteries patent bilaterally. Basilar artery widely patent. Superior cerebellar arteries patent bilaterally. Both of the posterior cerebral arteries supplied mainly via the basilar artery and are patent to their distal aspects. Venous sinuses: Patent. Anatomic variants: No significant anatomic variant. No aneurysm or vascular malformation. Delayed phase: Not performed. Review of the MIP images confirms the above findings IMPRESSION: 1. 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 2. Scattered atheromatous plaque throughout the carotid siphons with mild to moderate multifocal narrowing. 3. Atheromatous plaque about the carotid bifurcations bilaterally without flow-limiting stenosis. 4. Large layering right pleural effusion, with smaller left pleural effusion. Interlobular septal thickening suggestive of pulmonary edema. Critical Value/emergent results were called by telephone at the time of interpretation on 02/05/2016 at 6:19 pm to Dr. Ritta Slot , who verbally acknowledged these results. Electronically Signed   By: Rise Mu M.D.   On: 02/05/2016 19:04   Mr Brain Wo Contrast  Result Date: 02/06/2016 CLINICAL DATA:  Acute on chronic RIGHT-sided weakness, aphasia. Follow-up M3 occlusion. Assess stroke versus PRES. History of hypertension and diabetes. EXAM: MRI HEAD WITHOUT CONTRAST TECHNIQUE:  Multiplanar, multiecho pulse sequences of the brain and surrounding structures were obtained without intravenous contrast. COMPARISON:  CT HEAD February 05, 2016 and MRI of the head May 20, 2015 FINDINGS: BRAIN: Confluent reduced diffusion measuring up  to 5.8 cm in LEFT frontal lobe, involving the insula and operculum with low ADC values. Non contiguous patchy reduced diffusion LEFT anterior frontal lobe. No susceptibility artifact to suggest acute hemorrhage. The ventricles and sulci are normal for patient's age. Small area RIGHT occipital lobe encephalomalacia. Old LEFT pontomedullary infarcts. Old bilateral basal ganglia lacunar infarcts. Symmetric basal ganglia susceptibility artifact most consistent with old hemorrhage. Patchy supratentorial and pontine white matter FLAIR T2 hyperintensities. Mild ventriculomegaly on the basis of global parenchymal brain volume loss. No intraparenchymal mass effect, masses. No abnormal extra-axial fluid collections. VASCULAR: Normal major intracranial vascular flow voids present at skull base. SKULL AND UPPER CERVICAL SPINE: Empty sella. No suspicious calvarial bone marrow signal. Craniocervical junction maintained. SINUSES/ORBITS: Mild paranasal sinus mucosal thickening. The included ocular globes and orbital contents are non-suspicious. Status post LEFT scleral banding and ocular lens implant. OTHER: None. IMPRESSION: Acute small to moderate LEFT frontal lobe/ MCA territory infarct corresponding to perfusion abnormality. 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 Old basal ganglia and pontomedullary lacunar infarcts. Old RIGHT occipital lobe/ PCA territory infarct. Mild to moderate chronic small vessel ischemic disease. Mild atrophy. Electronically Signed   By: Awilda Metro M.D.   On: 02/06/2016 06:01   Ct Cerebral Perfusion W Contrast  Result Date: 02/05/2016 CLINICAL DATA:  Initial evaluation for acute right-sided weakness. EXAM: CT PERFUSION BRAIN TECHNIQUE:  Multiphase CT imaging of the brain was performed following IV bolus contrast injection. Subsequent parametric perfusion maps were calculated using RAPID software. CONTRAST:  90 cc of Isovue 370. FINDINGS: CT Brain Perfusion Findings: CBF (<30%) Volume: 21 ccmL Perfusion (Tmax>6.0s) volume: 92 ccmL Mismatch Volume: 71 ccmL Infarction Location:Core infarct primarily involves the left frontal operculum/anterior left frontal lobe, with some extension towards the left insula and left temporal lobe. Please note that while the mismatch volume is measured as 71 cc of this exam, there is felt to be some artifact on the T max perfusion images, with elevated T-max seen not only around the core infarct, but also within the right cerebral hemisphere as well as the right cerebellar hemisphere. Overall, while there is likely some penumbra about the area of core infarction, the recorded 71 cc is likely not accurate as to the volume of actual mismatch (which is likely significantly less, although difficult to accurately quantify on this exam). IMPRESSION: Acute small to moderate-sized left MCA territory infarct as above. Critical Value/emergent results were called by telephone at the time of interpretation on 02/05/2016 at 6:19 pm to Dr. Ritta Slot , who verbally acknowledged these results. Electronically Signed   By: Rise Mu M.D.   On: 02/05/2016 18:27   Dg Chest Port 1 View  Result Date: 02/05/2016 CLINICAL DATA:  Acute altered mental status. EXAM: PORTABLE CHEST 1 VIEW COMPARISON:  07/26/2015 FINDINGS: The patient has developed cardiomegaly with pulmonary vascular congestion and slight bilateral interstitial pulmonary edema. No acute bone abnormality. IMPRESSION: New cardiomegaly with pulmonary vascular congestion and slight bilateral interstitial pulmonary edema. Electronically Signed   By: Francene Boyers M.D.   On: 02/05/2016 16:01   Dg Swallowing Func-speech Pathology  Result Date:  02/06/2016 Objective Swallowing Evaluation: Type of Study: MBS-Modified Barium Swallow Study Patient Details Name: [name] MRN: [id number] Date of Birth: [birth year] Today's Date: 02/06/2016 Time: SLP Start Time (ACUTE ONLY): 1017-SLP Stop Time (ACUTE ONLY): 1033 SLP Time Calculation (min) (ACUTE ONLY): 16 min Past Medical History: Past Medical History: Diagnosis Date . BPH (benign prostatic hyperplasia)  . CHF (congestive heart failure) (HCC)  .  Diabetes mellitus without complication (HCC)  . Diabetic retinopathy associated with type 2 diabetes mellitus (HCC)  . Hypertension  . Lymphedema  . OSA (obstructive sleep apnea)  . TIA (transient ischemic attack)  Past Surgical History: Past Surgical History: Procedure Laterality Date . BLADDER SURGERY   . CARDIAC CATHETERIZATION   HPI: [name] a 56 y.o.malewith medical history significant for chronic CHF, insulin-dependent diabetes mellitus, hypertension, depression, OSA, ulcerative colitis, prior CVA with minimal residual right-sided weakness who presents to the emergency department with dysarthria, confusion, lethargy, and right facial droop. Patient lives alone and family resides in [place], [place] and [place]. Chest x-ray is notable for new cardiomegaly with pulmonary vascular congestion and slight bilateral interstitial edema. MRI acute small to moderate LEFT frontal lobe/ MCA territory infarct corresponding to perfusion abnormality. 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 Old basal ganglia. MBS recommended following BSE. No Data Recorded Assessment / Plan / Recommendation CHL IP CLINICAL IMPRESSIONS 02/06/2016 Therapy Diagnosis Moderate oral phase dysphagia;Mild pharyngeal phase dysphagia Clinical Impression Labial spillage, delayed transit and intermittent piecemeal pattern describe pt's oral function. Swallow initiation delayed to pyriform sinuses due to decreased sensation without penetration or aspiration with consecutive cup and straw  sips and minimal vallecular residue. Upon clinical observation during bedside swallow and following MBS, pt has multiple swallows and intermittent mild throat clears with the appearance of difficulty however pt safe with consecutive larger sips. Risk for pocketing present therefore recommend Dys 2 texture and thin liquids, crush meds, straws allowed, sit upright and check right side oral cavity for pocketing. ST will continue to follow.     Impact on safety and function Mild aspiration risk   CHL IP TREATMENT RECOMMENDATION 02/06/2016 Treatment Recommendations Therapy as outlined in treatment plan below   Prognosis 02/06/2016 Prognosis for Safe Diet Advancement Good Barriers to Reach Goals -- Barriers/Prognosis Comment -- CHL IP DIET RECOMMENDATION 02/06/2016 SLP Diet Recommendations Dysphagia 2 (Fine chop) solids;Thin liquid Liquid Administration via Straw;Cup Medication Administration Crushed with puree Compensations Slow rate;Small sips/bites;Minimize environmental distractions;Lingual sweep for clearance of pocketing Postural Changes Seated upright at 90 degrees   CHL IP OTHER RECOMMENDATIONS 02/06/2016 Recommended Consults -- Oral Care Recommendations Oral care BID Other Recommendations --   CHL IP FOLLOW UP RECOMMENDATIONS 02/06/2016 Follow up Recommendations Inpatient Rehab   CHL IP FREQUENCY AND DURATION 02/06/2016 Speech Therapy Frequency (ACUTE ONLY) min 2x/week Treatment Duration 2 weeks      CHL IP ORAL PHASE 02/06/2016 Oral Phase Impaired Oral - Pudding Teaspoon -- Oral - Pudding Cup -- Oral - Honey Teaspoon -- Oral - Honey Cup -- Oral - Nectar Teaspoon -- Oral - Nectar Cup -- Oral - Nectar Straw -- Oral - Thin Teaspoon -- Oral - Thin Cup Delayed oral transit;Piecemeal swallowing Oral - Thin Straw Delayed oral transit;Piecemeal swallowing Oral - Puree -- Oral - Mech Soft -- Oral - Regular Delayed oral transit;Weak lingual manipulation Oral - Multi-Consistency -- Oral - Pill -- Oral Phase - Comment --  CHL IP  PHARYNGEAL PHASE 02/06/2016 Pharyngeal Phase Impaired Pharyngeal- Pudding Teaspoon -- Pharyngeal -- Pharyngeal- Pudding Cup -- Pharyngeal -- Pharyngeal- Honey Teaspoon -- Pharyngeal -- Pharyngeal- Honey Cup -- Pharyngeal -- Pharyngeal- Nectar Teaspoon -- Pharyngeal -- Pharyngeal- Nectar Cup Delayed swallow initiation-pyriform sinuses Pharyngeal -- Pharyngeal- Nectar Straw -- Pharyngeal -- Pharyngeal- Thin Teaspoon -- Pharyngeal -- Pharyngeal- Thin Cup Delayed swallow initiation-pyriform sinuses;Pharyngeal residue - valleculae Pharyngeal -- Pharyngeal- Thin Straw Delayed swallow initiation-pyriform sinuses;Pharyngeal residue - valleculae Pharyngeal -- Pharyngeal- Puree -- Pharyngeal --  Pharyngeal- Mechanical Soft -- Pharyngeal -- Pharyngeal- Regular WFL Pharyngeal -- Pharyngeal- Multi-consistency -- Pharyngeal -- Pharyngeal- Pill -- Pharyngeal -- Pharyngeal Comment --  CHL IP CERVICAL ESOPHAGEAL PHASE 02/06/2016 Cervical Esophageal Phase WFL Pudding Teaspoon -- Pudding Cup -- Honey Teaspoon -- Honey Cup -- Nectar Teaspoon -- Nectar Cup -- Nectar Straw -- Thin Teaspoon -- Thin Cup -- Thin Straw -- Puree -- Mechanical Soft -- Regular -- Multi-consistency -- Pill -- Cervical Esophageal Comment -- No flowsheet data found. Royce Macadamia 02/06/2016, 11:14 AM Breck Coons Lonell [employer] Personnel officer [phone]               ASSESSMENT AND PLAN:    Principal Problem:   Stroke (cerebrum) (HCC) Active Problems:   AKI (acute kidney injury) (HCC)   Essential hypertension   Acute on chronic diastolic CHF (congestive heart failure) (HCC)   Anemia of chronic disease   Diabetes mellitus, type II, insulin dependent (HCC)   OSA ? compliacne   Hypokalemia   Hypertensive urgency   Acute CVA (cerebrovascular accident) (HCC)   Cerebral infarction due to occlusion of left middle cerebral artery (HCC)   Hypertension   Right sided weakness   Acute on chronic congestive heart failure (HCC)  Acute on chronic combined  systolic/diastolic CHF: EF down to 35% from normal EF by echo done at [facility] in 04/2015. Given IV lasix 80mg  BID x1 day then transitioned to PO Lasix 60mg  BID. Net negative 2.3L. He may need some more diuresis given elevated filling pressures on ECHO and marked LE edema. Will start IV lasix 80mg  BID and follow. Will also resume home Coreg 25mg  BID. Likely a hypertensive cardiomyopathy, will hold off on ischemic w/u for now.   Hypertensive urgency: BP improved from admission. Allowing for permissive HTN given acute ischemic CVA. Will resume home Coreg 25mg  BID given cardiomyopathy and NSVT  Acute CVA: neuro recommends ASA and plavix x 3 months followed by plavix alone. He will get a TEE tomorrow.   CKD: creat around baseline. Watch creat with diuresis   DMT2: continue current regimen  OSA: continue CPAP   NSVT: 12 beats noted on tele. Will resume Coreg 25mg  BID.   Dispo: he will be discharged to CIR    Signed: Cline Crock, PA-C 02/07/2016 2:48 PM  Pager [phone] Personally seen and examined. Agree with above.  56 year old male with acute stroke, aphasia, ejection fraction 35% down from 50% with markedly elevated hypertension, severe lower extremity edema, chronic appearing, telemetry with 12 beat run of nonsustained ventricular tachycardia.   Feet and lower extremity is markedly edematous, chronic appearing, regular rate and rhythm with occasional ectopy  - We will restart his home carvedilol 25 mg twice a day to not only help with his nonsustained ventricular tachycardia but also his cardiomyopathy. We will be careful not to decrease his blood pressure too rapidly given his acute stroke.  - Tomorrow if blood pressure still elevated, we will add back lisinopril 10 mg.  - We will also give him Lasix 80 mg IV twice a day, still appears fluid overloaded  - We will repeat echocardiogram likely in 2-3 months. If ejection fraction still remains decreased and he has recovered from his  aphasia, we will consider diagnostic heart catheterization at that time. We will watch him closely.  Donato Schultz, MD      Donato Schultz, MD

## 2016-02-07 NOTE — Progress Notes (Signed)
Physical Therapy Treatment Patient Details Name: [name] MRN: [id number] DOB: 11-16-1960 Today's Date: 02/07/2016    History of Present Illness 56 y.o. male admitted to [facility] on 02/05/16 for right facial droop and right sided weakness found to have a L MCA CVA.  Pt with significant PMHx of TIA, HTN, DM with diabetic retinopathy, and CHF.    PT Comments    Pt is progressing very well with his mobility.  He was able, with two person assist for safety and assist to manually hold his hand on the right side of the RW, walk into the hallway today.  He is an excellent inpatient rehab candidate.  PT will continue to follow acutely.   Follow Up Recommendations  CIR     Equipment Recommendations  3in1 (PT);Rolling walker with 5" wheels;Other (comment) (with right hand splint)    Recommendations for Other Services       Precautions / Restrictions Precautions Precautions: Fall Precaution Comments: watch BP, right sided weakness Restrictions Weight Bearing Restrictions: No    Mobility  Bed Mobility Overal bed mobility: Needs Assistance Bed Mobility: Supine to Sit     Supine to sit: Supervision     General bed mobility comments: Supervision for safety, pt coming out of right side of bed and when given extra time, and use of railing he was able to work his way up to sitting.   Transfers Overall transfer level: Needs assistance Equipment used: Rolling walker (2 wheeled) Transfers: Sit to/from Stand Sit to Stand: +2 physical assistance;Mod assist         General transfer comment: Two person mod assist to power up to standing from low bed.  Verbal cues and manual assist to get right foot in safe position to get to standing and assist for hand placement on RW on right (PT has to hold hand onto walker). Verbal cues for safe hand placement and speed during transitions.   Ambulation/Gait Ambulation/Gait assistance: +2 physical assistance;+2 safety/equipment;Min assist Ambulation  Distance (Feet): 55 Feet Assistive device: Rolling walker (2 wheeled) Gait Pattern/deviations: Step-to pattern;Decreased weight shift to right;Decreased dorsiflexion - right;Decreased step length - right Gait velocity: decreased Gait velocity interpretation: Below normal speed for age/gender General Gait Details: Pt was able to ambulate a short distance with RW.  PT had to physically hold his right hand onto the RW, but pt able to progress right foot forward with steppage gait pattern and improved right knee hyper extension today.  Pt initally was two person assist with gait, but transitioned to one person assist and chair to follow.        Modified Rankin (Stroke Patients Only) Modified Rankin (Stroke Patients Only) Pre-Morbid Rankin Score: Moderate disability Modified Rankin: Moderately severe disability     Balance Overall balance assessment: Needs assistance Sitting-balance support: Feet supported;Bilateral upper extremity supported Sitting balance-Leahy Scale: Fair Sitting balance - Comments: Pt needed assist in positioning his right hand flat on the bed as he had it curled under and was WB through his wrist    Standing balance support: Bilateral upper extremity supported Standing balance-Leahy Scale: Poor Standing balance comment: needs external assist in standing.                     Cognition Arousal/Alertness: Awake/alert Behavior During Therapy: WFL for tasks assessed/performed Overall Cognitive Status: Impaired/Different from baseline Area of Impairment: Attention;Following commands;Safety/judgement;Awareness;Problem solving   Current Attention Level: Sustained   Following Commands: Follows one step commands consistently;Follows multi-step commands inconsistently;Follows multi-step commands  with increased time Safety/Judgement: Decreased awareness of safety;Decreased awareness of deficits Awareness: Intellectual Problem Solving: Difficulty sequencing;Requires  verbal cues;Requires tactile cues General Comments: Pt better attending to his right side today.  Coming across midline with and without cues.      Exercises Total Joint Exercises Ankle Circles/Pumps: AROM;Both;20 reps Quad Sets: AROM;Both;10 reps Heel Slides: AAROM;Both;10 reps        Pertinent Vitals/Pain Pain Assessment: No/denies pain           PT Goals (current goals can now be found in the care plan section) Acute Rehab PT Goals Patient Stated Goal: Wants to walk Progress towards PT goals: Progressing toward goals    Frequency    Min 4X/week      PT Plan Current plan remains appropriate       End of Session Equipment Utilized During Treatment: Gait belt Activity Tolerance: Patient limited by fatigue Patient left: in chair;with call bell/phone within reach;with chair alarm set     Time: 1346-1416 PT Time Calculation (min) (ACUTE ONLY): 30 min  Charges:  $Gait Training: 8-22 mins $Therapeutic Activity: 8-22 mins                     [name], PT, DPT [phone]   02/07/2016, 2:26 PM

## 2016-02-07 NOTE — Progress Notes (Signed)
Inpatient Diabetes Program Recommendations  AACE/ADA: New Consensus Statement on Inpatient Glycemic Control (2015)  Target Ranges:  Prepandial:   less than 140 mg/dL      Peak postprandial:   less than 180 mg/dL (1-2 hours)      Critically ill patients:  140 - 180 mg/dL   Results for BENET, KITCHELL (MRN [id number]) as of 02/07/2016 09:03  Ref. Range 02/06/2016 20:56 02/07/2016 00:01 02/07/2016 04:33 02/07/2016 05:01 02/07/2016 07:33  Glucose-Capillary Latest Ref Range: 65 - 99 mg/dL 449 (H) 201 (H) 66 78 67   Review of Glycemic Control  Diabetes history: DM2 Outpatient Diabetes medications: Lantus 18 units QHS, Novolog 3 units TID with meals Current orders for Inpatient glycemic control: Lantus 16 units QHS, Novolog 0-9 units Q4H  Inpatient Diabetes Program Recommendations: Insulin - Basal: Fasting glucose 67 mg/dl this morning. Please consider decreasing Lantus to 13 units QHS.  Thanks, Orlando Penner, RN, MSN, CDE Diabetes Coordinator Inpatient Diabetes Program [phone] (Team Pager from 8am to 5pm)

## 2016-02-07 NOTE — Progress Notes (Signed)
STROKE TEAM PROGRESS NOTE   SUBJECTIVE (INTERVAL HISTORY) Brother and sister in law are at bedside. Pt still sleepy this morning but arousable and following commands but still expressive aphasia. TTE showed worsening EF now 35%. Will need cardiology consult. Plan for TEE and loop tomorrow.   OBJECTIVE Temp:  [97.5 F (36.4 C)-98.9 F (37.2 C)] 97.5 F (36.4 C) (01/04 0114) Pulse Rate:  [63-86] 63 (01/04 0114) Cardiac Rhythm: Normal sinus rhythm (01/04 0700) Resp:  [16-18] 18 (01/04 0114) BP: (154-177)/(106-114) 154/106 (01/04 0114) SpO2:  [100 %] 100 % (01/04 0114) Weight:  [104.1 kg (229 lb 8 oz)] 104.1 kg (229 lb 8 oz) (01/04 0700)  CBC:   Recent Labs Lab 02/05/16 1531 02/05/16 1532  WBC  --  6.3  NEUTROABS  --  5.0  HGB 12.6* 12.1*  HCT 37.0* 37.0*  MCV  --  87.7  PLT  --  230    Basic Metabolic Panel:   Recent Labs Lab 02/06/16 0429 02/07/16 0730  NA 145 143  K 3.8 3.4*  CL 110 110  CO2 24 28  GLUCOSE 112* 70  BUN 23* 21*  CREATININE 1.45* 1.58*  CALCIUM 8.9 8.7*  MG 1.9  --     Lipid Panel:     Component Value Date/Time   CHOL 157 02/06/2016 0429   TRIG 80 02/06/2016 0429   HDL 47 02/06/2016 0429   CHOLHDL 3.3 02/06/2016 0429   VLDL 16 02/06/2016 0429   LDLCALC 94 02/06/2016 0429   HgbA1c:  Lab Results  Component Value Date   HGBA1C 6.4 (H) 02/05/2016   Urine Drug Screen:     Component Value Date/Time   LABOPIA NONE DETECTED 02/05/2016 1624   COCAINSCRNUR NONE DETECTED 02/05/2016 1624   LABBENZ NONE DETECTED 02/05/2016 1624   AMPHETMU NONE DETECTED 02/05/2016 1624   THCU NONE DETECTED 02/05/2016 1624   LABBARB NONE DETECTED 02/05/2016 1624      IMAGING I have personally reviewed the radiological images below and agree with the radiology interpretations.  Ct Head Wo Contrast 02/05/2016 Old left basal ganglia and right occipital infarct. No acute intracranial abnormality.   Ct Angio Head W Or Wo Contrast Ct Angio Neck W Or Wo  Contrast 02/05/2016 1. 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 2. Scattered atheromatous plaque throughout the carotid siphons with mild to moderate multifocal narrowing. 3. Atheromatous plaque about the carotid bifurcations bilaterally without flow-limiting stenosis. 4. Large layering right pleural effusion, with smaller left pleural effusion. Interlobular septal thickening suggestive of pulmonary edema.   Ct Cerebral Perfusion W Contrast 02/05/2016 Acute small to moderate-sized left MCA territory infarct   Mr Brain Wo Contrast 02/06/2016 Acute small to moderate LEFT frontal lobe/ MCA territory infarct corresponding to perfusion abnormality. 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 Old basal ganglia and pontomedullary lacunar infarcts. Old RIGHT occipital lobe/ PCA territory infarct. Mild to moderate chronic small vessel ischemic disease. Mild atrophy.   Dg Chest Port 1 View 02/05/2016 New cardiomegaly with pulmonary vascular congestion and slight bilateral interstitial pulmonary edema.   LE venous Doppler - negative for DVT  TTE  - Left ventricle: The cavity size was normal. Wall thickness was increased in a pattern of severe LVH. LV mass index 356 g/m2. RWT of 0.73, suggesting severely abnormal concentric hypertrophy. Systolic function was moderately to severely reduced. The estimated ejection fraction was 35%. Diffuse hypokinesis. Doppler parameters are consistent with pseduonormal left ventricular relaxation (  grade 2 diastolic dysfunction). The E/e&' ratio is >15, suggesting elevated LV filling pressure. Isovolumic relaxation time: 126 ms. - Aortic valve: Mildly thickened and sclerotic leaflet tips. No stenosis. There was trivial regurgitation. - Mitral valve: Mildly thickened leaflets . There was trivial regurgitation. - Left atrium: Severely dilated. - Right ventricle: The cavity size was normal. Systolic function  is moderately reduced. - Right atrium: Moderately dilated. - Inferior vena cava: The vessel was dilated. The respirophasic diameter changes were blunted (< 50%), consistent with elevated central venous pressure. Impressions: - LVEF 35%, severe concentric LVH (IVSd of 2.3 cm), global hypokinesis, grade 2 DD with high LV filling pressure, sclerotic aortic leaflet tips without stenosis, trivial AI and MR, severe LAE and moderate RAE, moderate RV systolic dysfunction, dilated IVC, no pericardial effusion.  TEE pending   PHYSICAL EXAM  Temp:  [97.5 F (36.4 C)-98.9 F (37.2 C)] 97.5 F (36.4 C) (01/04 0114) Pulse Rate:  [63-86] 63 (01/04 0114) Resp:  [16-18] 18 (01/04 0114) BP: (154-177)/(106-114) 154/106 (01/04 0114) SpO2:  [100 %] 100 % (01/04 0114) Weight:  [104.1 kg (229 lb 8 oz)] 104.1 kg (229 lb 8 oz) (01/04 0700)  General - Well nourished, well developed, sleepy and drowsy, but arousable.  Ophthalmologic - Fundi not visualized due to noncooperation.  Cardiovascular - Regular rate and rhythm.  Neuro - Drowsy and sleepy, but arousable, following simple commands. Expressive aphasia, not able to name or repeat. Limited bilateral gaze, left gaze preference, able to cross midline, questionable for right neglect. PERRL, blinking to visual threat inconsistent bilaterally. Right facial droop. Tongue at right side in mouth. LUE 5/5, RUE 3/5, BLE 3/5 with limited DF and PF bilaterally. DTR 1+ and no babinski. Sensation, coordination and gait not tested.   ASSESSMENT/PLAN Mr. Shin Hudzinski is a 56 y.o. male with history of previous stroke with mild right-sided hemiparesis  presenting with R sided weakness. He did not receive IV t-PA due to being outside the window.   Stroke:  Dominant left frontal/MCA territory infarct embolic pattern secondary to unknown source. Patient does have multiple stroke risk factors, however, cardiac embolic source are most likely.  Resultant  expressive aphasia,  right hemiparesis  CT old L BG and R occipital infarcts  CTA head and neck L M3 occlusion, R pleural effusion, pulmonary edema.  CTP small to moderate size L MCA territory infarct  MRI  Small to moderate L frontal/MCA territory infarct. Old B BG, pontomedullary, R occipital/PCA infarcts.  2D Echo  EF 35%. Down from 55% prior. Cardiology consult requested  LE doppler no DVT  TEE to look for embolic source. Arranged with [facility] for tomorrow.  (I have made patient NPO after midnight tonight).   If TEE negative, a [facility] electrophysiologist will consult and consider placement of an implantable loop recorder to evaluate for atrial fibrillation as etiology of stroke. This has been explained to patient/family by Dr. Roda Shutters and they are agreeable.   LDL 94  HgbA1c 6.4  Lovenox 40 mg sq daily for VTE prophylaxis DIET - DYS 1 Room service appropriate? Yes; Fluid consistency: Thin  No antithrombotic prior to admission, now on aspirin 300 mg suppository daily. Due to intracranial stenosis, recommend DAPT with aspirin and Plavix for 3 months and then Plavix alone.  Patient counseled to be compliant with his antithrombotic medications  Ongoing aggressive stroke risk factor management  Therapy recommendations:  CIR. Admissions coordinator following  Disposition:  pending  Acute on chronic CHF  EF down from 55% to 35%  Fluid overload  CTA showed pleural effusion  Cardiology on board  On lasix  Hx of stroke -   03/2014 pontine infarct   started on Aggrenox   A1c 13.7 and LDL 97   residual right-sided weakness and slurred speech  Hypertension Hypertensive Urgency  BP as high as 212/124 on arrival in setting of neurologic symptoms  Remains elevated this am at 186/119  Permissive hypertension (OK if < 220/120) but gradually normalize in 5-7 days  Receiving prn labetolol  Long-term BP goal  normotensive  Hyperlipidemia  Home meds:  lipitor 40, resumed in hospital  LDL 94, goal < 70  Continue statin at discharge  Diabetes type II  HgbA1c 6.4, goal < 7.0  On Lantus  SSI  CBG monitoring  Diabetes RN recommends increase lantus to 13u daily  Other Stroke Risk Factors  Obesity, Body mass index is 32.01 kg/m., recommend weight loss, diet and exercise as appropriate   Obstructive sleep apnea  Other Active Problems  Hypokalemia  Hospital day # 2  Marvel Plan, MD PhD Stroke Neurology 02/07/2016 10:14 PM    To contact Stroke Continuity provider, please refer to [email or web address]. After hours, contact General Neurology

## 2016-02-07 NOTE — Consult Note (Addendum)
Physical Medicine and Rehabilitation Consult Reason for Consult: Left frontal MCA territory infarct Referring Physician: [facility]   HPI: [name] is a 56 y.o. right handed male with history of diastolic congestive heart failure,OSA, diabetes mellitus and peripheral neuropathy, hypertension, TIA maintained on aspirin. Per chart review patient lives alone. Reported to be independent with assistive device prior to admission. One level apartment. Presented 02/05/2016 with slurred speech, altered mental status and right-sided weakness. Cranial CT scan negative for acute changes. 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 CT cerebral perfusion showed acute small to moderate-sized left MCA territory infarct. Patient did not receive TPA. CT angiogram head and neck showed acute left M3 occlusion as well as scattered atheromatous plaque. MRI completed 02/07/2016 showed moderate left frontal lobe MCA territory infarct as well as old bilateral basal ganglia infarcts and right occipital lobe. Echocardiogram pending. Venous Doppler studies negative for DVT. Maintained on aspirin for CVA prophylaxis. Subcutaneous Lovenox for DVT prophylaxis. Dysphagia# 2 thin liquid diets. Physical and occupational therapy evaluations completed with recommendations of physical medicine rehabilitation consult.   Review of Systems  Unable to perform ROS: Language   Past Medical History:  Diagnosis Date  . BPH (benign prostatic hyperplasia)   . CHF (congestive heart failure) (HCC)   . Diabetes mellitus without complication (HCC)   . Diabetic retinopathy associated with type 2 diabetes mellitus (HCC)   . Hypertension   . Lymphedema   . OSA (obstructive sleep apnea)   . TIA (transient ischemic attack)    Past Surgical History:  Procedure Laterality Date  . BLADDER SURGERY    . CARDIAC CATHETERIZATION     Family History  Problem Relation Age of Onset  . Cancer Mother   . Cancer Father    Social  History:  reports that he has never smoked. He has never used smokeless tobacco. He reports that he does not drink alcohol or use drugs. Allergies:  Allergies  Allergen Reactions  . Chicken Allergy Other (See Comments)    Sneezing   . Sildenafil Other (See Comments)    Per the [facility], this is possibly an allergy   Medications Prior to Admission  Medication Sig Dispense Refill  . acetaminophen (TYLENOL) 325 MG tablet Take 650 mg by mouth 3 (three) times daily as needed for mild pain, moderate pain or headache.     Marland Kitchen amLODipine (NORVASC) 10 MG tablet Take 10 mg by mouth daily.    Marland Kitchen aspirin 325 MG tablet Take 325 mg by mouth daily.    Marland Kitchen atorvastatin (LIPITOR) 40 MG tablet Take 20 mg by mouth at bedtime.     . bumetanide (BUMEX) 2 MG tablet Take 2 mg by mouth 2 (two) times daily.    . Calcium Carbonate-Vitamin D (CALCIUM-VITAMIN D) 500-200 MG-UNIT tablet Take 1 tablet by mouth daily.     . carvedilol (COREG) 25 MG tablet Take 50 mg by mouth 2 (two) times daily.     . citalopram (CELEXA) 40 MG tablet Take 40 mg by mouth daily.    Marland Kitchen doxazosin (CARDURA) 8 MG tablet Take 4 mg by mouth at bedtime.     . ferrous sulfate 325 (65 FE) MG tablet Take 325 mg by mouth every Monday, Wednesday, and Friday.     . hydrALAZINE (APRESOLINE) 100 MG tablet Take 100 mg by mouth 3 (three) times daily.    . insulin aspart (NOVOLOG) 100 UNIT/ML injection Inject 3 Units into the skin 3 (three) times daily before  meals.     . insulin glargine (LANTUS) 100 UNIT/ML injection Inject 18 Units into the skin at bedtime.     Marland Kitchen lisinopril (PRINIVIL,ZESTRIL) 10 MG tablet Take 10 mg by mouth daily.    Marland Kitchen oxybutynin (DITROPAN) 5 MG tablet Take 5 mg by mouth 3 (three) times daily.    . pantoprazole (PROTONIX) 40 MG tablet Take 40 mg by mouth daily. Take on an empty stomach 30 minutes before a meal    . polyethylene glycol (MIRALAX / GLYCOLAX) packet Take 17 g by mouth daily as needed for mild constipation or moderate constipation.      . potassium chloride SA (K-DUR,KLOR-CON) 20 MEQ tablet Take 20 mEq by mouth daily.    Marland Kitchen spironolactone (ALDACTONE) 25 MG tablet Take 50 mg by mouth daily.     Marland Kitchen sulfaSALAzine (AZULFIDINE) 500 MG tablet Take 2,000 mg by mouth 2 (two) times daily after a meal.     . tamsulosin (FLOMAX) 0.4 MG CAPS capsule Take 0.4 mg by mouth at bedtime.     . docusate sodium (COLACE) 100 MG capsule Take 100 mg by mouth daily as needed for mild constipation.    . furosemide (LASIX) 40 MG tablet Take 1 tablet (40 mg total) by mouth every other day. (Patient not taking: Reported on 02/06/2016) 30 tablet   . hydrocerin (EUCERIN) CREA Apply 1 application topically daily.    Marland Kitchen ibuprofen (ADVIL,MOTRIN) 800 MG tablet Take 800 mg by mouth 3 (three) times daily as needed for headache or moderate pain (with food).    Marland Kitchen ipratropium (ATROVENT) 0.02 % nebulizer solution Take 2.5 mLs (0.5 mg total) by nebulization every 2 (two) hours as needed for wheezing or shortness of breath. (Patient not taking: Reported on 02/06/2016) 75 mL 0  . isosorbide mononitrate (IMDUR) 30 MG 24 hr tablet Take 30 mg by mouth daily.    Marland Kitchen lisinopril (PRINIVIL,ZESTRIL) 5 MG tablet Take 1 tablet (5 mg total) by mouth daily. (Patient not taking: Reported on 02/06/2016) 30 tablet 0  . loperamide (IMODIUM) 2 MG capsule Take 1 capsule (2 mg total) by mouth 4 (four) times daily as needed for diarrhea or loose stools. (Patient not taking: Reported on 02/06/2016) 12 capsule 0  . metolazone (ZAROXOLYN) 5 MG tablet Take 5 mg by mouth every 3 (three) days.    . Multiple Vitamin (MULTIVITAMIN WITH MINERALS) TABS tablet Take 1 tablet by mouth daily.    . ondansetron (ZOFRAN ODT) 4 MG disintegrating tablet Take 1 tablet (4 mg total) by mouth every 8 (eight) hours as needed for nausea or vomiting. (Patient not taking: Reported on 02/06/2016) 10 tablet 0  . silver sulfADIAZINE (SILVADENE) 1 % cream Apply 1 application topically daily as needed (skin care).       Home: Home  Living Family/patient expects to be discharged to:: Private residence Living Arrangements: Alone Type of Home: Apartment Home Access: Level entry Home Layout: One level Home Equipment: Walker - 2 wheels Additional Comments: Unsure of accuracy of hx as pt is giving inconsistant head nods and inconsistant answers to yes/no questions.    Lives With: Alone  Functional History: Prior Function Level of Independence: Independent with assistive device(s) Comments: Pt reports he walked with a walker, but not sure how accurate his answers are at this time.  Functional Status:  Mobility: Bed Mobility Overal bed mobility: Needs Assistance Bed Mobility: Supine to Sit, Rolling Rolling: Mod assist, Min assist Supine to sit: +2 for safety/equipment, Min assist General bed mobility  comments: Min assist to help progress legs EOB and support trunk during transition to sitting.   Min assist to roll to his right side and mod assist to roll to his left side.  Transfers Overall transfer level: Needs assistance Equipment used: 1 person hand held assist Transfers: Sit to/from Stand, Stand Pivot Transfers Sit to Stand: +2 physical assistance, Mod assist Stand pivot transfers: +2 safety/equipment, Mod assist General transfer comment: Two person mod assist to support trunk, anteriorly weight shift, block and stabilize his right leg.  Pt was able to take steps around to the elevated chair to his left by dragging his hyperextended  R leg behind him.   Ambulation/Gait General Gait Details: Did not assess today.  Might be safer with third person assisting given his size and his right inattention.     ADL: ADL Overall ADL's : Needs assistance/impaired Eating/Feeding: Set up, Sitting Eating/Feeding Details (indicate cue type and reason): cues to beware of drinking coffee it was hot Grooming: Wash/dry face, Minimal assistance, Sitting Upper Body Bathing: Moderate assistance Lower Body Bathing: Total  assistance Upper Body Dressing : Moderate assistance Upper Body Dressing Details (indicate cue type and reason): cues to dress R UE Lower Body Dressing: Total assistance Lower Body Dressing Details (indicate cue type and reason): 2-3 plus pitting edema present in feet Toilet Transfer: +2 for physical assistance, Moderate assistance, Stand-pivot General ADL Comments: Pt transferred to chair from EOB. pt with R inattention  Cognition: Cognition Overall Cognitive Status: Impaired/Different from baseline Arousal/Alertness: Awake/alert (somewhat drowsy) Orientation Level: Oriented to person, Disoriented to situation, Disoriented to time Attention: Sustained Sustained Attention: Impaired Sustained Attention Impairment: Verbal basic, Functional basic Memory:  (will assess further) Awareness: Impaired Awareness Impairment: Intellectual impairment, Emergent impairment, Anticipatory impairment Problem Solving: Impaired Problem Solving Impairment: Verbal basic, Functional basic Safety/Judgment: Impaired Cognition Arousal/Alertness: Awake/alert Behavior During Therapy: WFL for tasks assessed/performed Overall Cognitive Status: Impaired/Different from baseline Area of Impairment: Following commands, Safety/judgement, Awareness, Problem solving Following Commands: Follows one step commands consistently (on the left side) Safety/Judgement: Decreased awareness of safety, Decreased awareness of deficits Awareness: Intellectual Problem Solving: Difficulty sequencing, Requires verbal cues, Requires tactile cues General Comments: Pt with left gaze and right inattention.  Needs cues to come past midline and attend to the right.  He is able to help manage his arm after you help him find it visually.  Not sure of accuracy of yes / no questions and family not present to confirm. pt incontinent of bowel and bladder on arrival with adult diaper. pt does not request (A) and rather sleeping with soiled diaper. Pt  needed cues to have (A). Pt reports "yes" with head when asked "should you get back to bed by yourself " but when asked to verbalized states "no"   Blood pressure (!) 154/106, pulse 63, temperature 97.5 F (36.4 C), temperature source Oral, resp. rate 18, height 5\' 11"  (1.803 m), weight 108.5 kg (239 lb 3.2 oz), SpO2 100 %. Physical Exam  Constitutional: He appears well-developed.  HENT:  Head: Normocephalic.  Eyes:  Pupils reactive to light  Neck: Normal range of motion. Neck supple. No thyromegaly present.  Cardiovascular: Normal rate, regular rhythm and normal heart sounds.   Respiratory:  Fair inspiratory effort. He does have some upper airway congestion  GI: Soft. Bowel sounds are normal. He exhibits no distension.  Musculoskeletal: He exhibits edema (substantial R>L lymphedema 3+).  Neurological: He is alert.  Makes eye contact with examiner. Moderate to severe dysarthria with some expressive  langauge deficits also.. Some delay in processing. He does show a left gaze preference. Follows simple commands. RUE 3/5 prox to 2/5 distal. RLE: 1/5 hf, ke and 2/5 ankles. LUE 3-4/5. LLE 2- to 2+/5.   Skin: Skin is warm and dry.    Results for orders placed or performed during the hospital encounter of 02/05/16 (from the past 24 hour(s))  Glucose, capillary     Status: Abnormal   Collection Time: 02/06/16 12:36 PM  Result Value Ref Range   Glucose-Capillary 123 (H) 65 - 99 mg/dL  Glucose, capillary     Status: Abnormal   Collection Time: 02/06/16  4:47 PM  Result Value Ref Range   Glucose-Capillary 109 (H) 65 - 99 mg/dL  Glucose, capillary     Status: Abnormal   Collection Time: 02/06/16  8:56 PM  Result Value Ref Range   Glucose-Capillary 103 (H) 65 - 99 mg/dL   Comment 1 Notify RN    Comment 2 Document in Chart   Glucose, capillary     Status: Abnormal   Collection Time: 02/07/16 12:01 AM  Result Value Ref Range   Glucose-Capillary 106 (H) 65 - 99 mg/dL   Comment 1 Notify RN     Comment 2 Document in Chart   Glucose, capillary     Status: None   Collection Time: 02/07/16  4:33 AM  Result Value Ref Range   Glucose-Capillary 66 65 - 99 mg/dL   Comment 1 Notify RN    Comment 2 Document in Chart   Glucose, capillary     Status: None   Collection Time: 02/07/16  5:01 AM  Result Value Ref Range   Glucose-Capillary 78 65 - 99 mg/dL   Comment 1 Notify RN    Comment 2 Document in Chart    Ct Angio Head W Or Wo Contrast  Result Date: 02/05/2016 CLINICAL DATA:  Initial evaluation for acute right-sided weakness, slurred speech, aphasia. EXAM: CT ANGIOGRAPHY HEAD AND NECK TECHNIQUE: Multidetector CT imaging of the head and neck was performed using the standard protocol during bolus administration of intravenous contrast. Multiplanar CT image reconstructions and MIPs were obtained to evaluate the vascular anatomy. Carotid stenosis measurements (when applicable) are obtained utilizing NASCET criteria, using the distal internal carotid diameter as the denominator. CONTRAST:  90 cc of Isovue 370. COMPARISON:  Prior CT from earlier the same day. FINDINGS: CT HEAD FINDINGS Brain: Atrophy with chronic microvascular ischemic disease again noted. Remote lacunar infarcts within the bilateral basal ganglia. Now evident is subtle evolving hypodensity involving the left insular cortex extending towards the left frontal operculum (series 201, image 18). This is more evident as compared to previous examination performed earlier the same day. No significant mass effect. No acute intracranial hemorrhage. Hyperdensity at the level of the left sylvian fissure suspicious for thrombus (series 201, image 19). No mass lesion, midline shift, or mass effect. No hydrocephalus. No extra-axial fluid collection. Vascular: Vascular calcifications within the carotid siphons. Hyperdensity at the level the left sylvian fissure suspicious for thrombus (series 201, image 19). No other hyperdense vessel. Skull: Scalp soft  tissues within normal limits.  Calvarium intact. Sinuses: Globes and orbital soft tissues within normal limits. Patient is status post scleral banding on the left. Mild opacity within the right ethmoidal air cells. Paranasal sinuses are otherwise clear. No mastoid effusion. CTA NECK FINDINGS Aortic arch: Visualized aortic arch of normal caliber with normal branch pattern. Scattered atheromatous plaque within the arch itself and about the origin of the  great vessels without flow-limiting stenosis. Visualized subclavian arteries are widely patent. Right carotid system: Right common carotid artery patent from its origin to the bifurcation. Scattered calcified plaque about the right bifurcation without flow-limiting stenosis. Right ICA patent distally to the skullbase without stenosis, dissection, or occlusion. Left carotid system: Left common carotid artery patent from its origin to the bifurcation. Scattered calcified plaque about the left bifurcation without flow-limiting stenosis. Left ICA patent distally to the skullbase without stenosis, dissection, or occlusion. Vertebral arteries: Both of the vertebral arteries arise from the subclavian arteries. The the vertebral arteries widely patent without stenosis, dissection, or occlusion. Focal plaque noted at the origin of the right vertebral artery without high-grade stenosis. Skeleton: No acute osseous abnormality. No worrisome lytic or blastic osseous lesions. Other neck: Visualized soft tissues of the neck demonstrate no acute abnormality. Upper chest: Visualized upper mediastinum within normal limits. Layering right pleural effusion. Probable smaller left effusion. Scattered interlobular septal thickening within the partially visualized lungs, suggesting edema. Review of the MIP images confirms the above findings CTA HEAD FINDINGS Anterior circulation: Petrous segments patent bilaterally. Scattered calcified atheromatous plaque within the cavernous/ supraclinoid  ICAs with mild to moderate multifocal narrowing. ICA termini patent. Left A1 segment dominant and widely patent. Right A1 segment hypoplastic but patent as well. Anterior communicating artery normal. Anterior cerebral arteries patent to their distal aspects. Right M1 segment patent without stenosis or occlusion. Right MCA bifurcation normal. Right MCA branches well opacified to their distal aspects. Left M1 segment patent without stenosis or occlusion. Left MCA bifurcation normal. No proximal M2 branch occlusion. There is abrupt occlusion of a left M3 branch, middle division (series 602, image 80). This corresponds with previously seen hyperdensity on noncontrast portion of this exam. No other arterial branch occlusion. Left MCA branches otherwise patent. Posterior circulation: Scattered plaque within the V4 segments bilaterally, right greater than left with mild multifocal narrowing. Posterior inferior cerebral arteries patent bilaterally. Basilar artery widely patent. Superior cerebellar arteries patent bilaterally. Both of the posterior cerebral arteries supplied mainly via the basilar artery and are patent to their distal aspects. Venous sinuses: Patent. Anatomic variants: No significant anatomic variant. No aneurysm or vascular malformation. Delayed phase: Not performed. Review of the MIP images confirms the above findings IMPRESSION: 1. 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 2. Scattered atheromatous plaque throughout the carotid siphons with mild to moderate multifocal narrowing. 3. Atheromatous plaque about the carotid bifurcations bilaterally without flow-limiting stenosis. 4. Large layering right pleural effusion, with smaller left pleural effusion. Interlobular septal thickening suggestive of pulmonary edema. Critical Value/emergent results were called by telephone at the time of interpretation on 02/05/2016 at 6:19 pm to Dr. Ritta Slot , who verbally acknowledged these results. Electronically Signed   By: Rise Mu M.D.   On: 02/05/2016 19:04   Ct Head Wo Contrast  Result Date: 02/05/2016 CLINICAL DATA:  Altered mental status, aphasia, right-sided weakness EXAM: CT HEAD WITHOUT CONTRAST TECHNIQUE: Contiguous axial images were obtained from the base of the skull through the vertex without intravenous contrast. COMPARISON:  MRI 05/19/2000 FINDINGS: Brain: Old lacunar infarct in the left basal ganglia. Old infarct and encephalomalacia in the right occipital lobe. No acute intracranial abnormality. Specifically, no hemorrhage, hydrocephalus, mass lesion, acute infarction, or significant intracranial injury. Vascular: No hyperdense vessel or unexpected calcification. Skull: No acute calvarial abnormality. Sinuses/Orbits: Visualized paranasal sinuses and mastoids clear. Orbital soft tissues unremarkable. Other: None IMPRESSION:  Old left basal ganglia and right occipital infarct. No acute intracranial abnormality. Electronically Signed   By: Charlett Nose M.D.   On: 02/05/2016 15:25   Ct Angio Neck W Or Wo Contrast  Result Date: 02/05/2016 CLINICAL DATA:  Initial evaluation for acute right-sided weakness, slurred speech, aphasia. EXAM: CT ANGIOGRAPHY HEAD AND NECK TECHNIQUE: Multidetector CT imaging of the head and neck was performed using the standard protocol during bolus administration of intravenous contrast. Multiplanar CT image reconstructions and MIPs were obtained to evaluate the vascular anatomy. Carotid stenosis measurements (when applicable) are obtained utilizing NASCET criteria, using the distal internal carotid diameter as the denominator. CONTRAST:  90 cc of Isovue 370. COMPARISON:  Prior CT from earlier the same day. FINDINGS: CT HEAD FINDINGS Brain: Atrophy with chronic microvascular ischemic disease again noted. Remote lacunar infarcts within the bilateral basal ganglia. Now evident is subtle evolving  hypodensity involving the left insular cortex extending towards the left frontal operculum (series 201, image 18). This is more evident as compared to previous examination performed earlier the same day. No significant mass effect. No acute intracranial hemorrhage. Hyperdensity at the level of the left sylvian fissure suspicious for thrombus (series 201, image 19). No mass lesion, midline shift, or mass effect. No hydrocephalus. No extra-axial fluid collection. Vascular: Vascular calcifications within the carotid siphons. Hyperdensity at the level the left sylvian fissure suspicious for thrombus (series 201, image 19). No other hyperdense vessel. Skull: Scalp soft tissues within normal limits.  Calvarium intact. Sinuses: Globes and orbital soft tissues within normal limits. Patient is status post scleral banding on the left. Mild opacity within the right ethmoidal air cells. Paranasal sinuses are otherwise clear. No mastoid effusion. CTA NECK FINDINGS Aortic arch: Visualized aortic arch of normal caliber with normal branch pattern. Scattered atheromatous plaque within the arch itself and about the origin of the great vessels without flow-limiting stenosis. Visualized subclavian arteries are widely patent. Right carotid system: Right common carotid artery patent from its origin to the bifurcation. Scattered calcified plaque about the right bifurcation without flow-limiting stenosis. Right ICA patent distally to the skullbase without stenosis, dissection, or occlusion. Left carotid system: Left common carotid artery patent from its origin to the bifurcation. Scattered calcified plaque about the left bifurcation without flow-limiting stenosis. Left ICA patent distally to the skullbase without stenosis, dissection, or occlusion. Vertebral arteries: Both of the vertebral arteries arise from the subclavian arteries. The the vertebral arteries widely patent without stenosis, dissection, or occlusion. Focal plaque noted at  the origin of the right vertebral artery without high-grade stenosis. Skeleton: No acute osseous abnormality. No worrisome lytic or blastic osseous lesions. Other neck: Visualized soft tissues of the neck demonstrate no acute abnormality. Upper chest: Visualized upper mediastinum within normal limits. Layering right pleural effusion. Probable smaller left effusion. Scattered interlobular septal thickening within the partially visualized lungs, suggesting edema. Review of the MIP images confirms the above findings CTA HEAD FINDINGS Anterior circulation: Petrous segments patent bilaterally. Scattered calcified atheromatous plaque within the cavernous/ supraclinoid ICAs with mild to moderate multifocal narrowing. ICA termini patent. Left A1 segment dominant and widely patent. Right A1 segment hypoplastic but patent as well. Anterior communicating artery normal. Anterior cerebral arteries patent to their distal aspects. Right M1 segment patent without stenosis or occlusion. Right MCA bifurcation normal. Right MCA branches well opacified to their distal aspects. Left M1 segment patent without stenosis or occlusion. Left MCA bifurcation normal. No proximal M2 branch occlusion. There is abrupt occlusion of a left M3  branch, middle division (series 602, image 80). This corresponds with previously seen hyperdensity on noncontrast portion of this exam. No other arterial branch occlusion. Left MCA branches otherwise patent. Posterior circulation: Scattered plaque within the V4 segments bilaterally, right greater than left with mild multifocal narrowing. Posterior inferior cerebral arteries patent bilaterally. Basilar artery widely patent. Superior cerebellar arteries patent bilaterally. Both of the posterior cerebral arteries supplied mainly via the basilar artery and are patent to their distal aspects. Venous sinuses: Patent. Anatomic variants: No significant anatomic variant. No aneurysm or vascular malformation. Delayed  phase: Not performed. Review of the MIP images confirms the above findings IMPRESSION: 1. 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 2. Scattered atheromatous plaque throughout the carotid siphons with mild to moderate multifocal narrowing. 3. Atheromatous plaque about the carotid bifurcations bilaterally without flow-limiting stenosis. 4. Large layering right pleural effusion, with smaller left pleural effusion. Interlobular septal thickening suggestive of pulmonary edema. Critical Value/emergent results were called by telephone at the time of interpretation on 02/05/2016 at 6:19 pm to Dr. Ritta Slot , who verbally acknowledged these results. Electronically Signed   By: Rise Mu M.D.   On: 02/05/2016 19:04   Mr Brain Wo Contrast  Result Date: 02/06/2016 CLINICAL DATA:  Acute on chronic RIGHT-sided weakness, aphasia. Follow-up M3 occlusion. Assess stroke versus PRES. History of hypertension and diabetes. EXAM: MRI HEAD WITHOUT CONTRAST TECHNIQUE: Multiplanar, multiecho pulse sequences of the brain and surrounding structures were obtained without intravenous contrast. COMPARISON:  CT HEAD February 05, 2016 and MRI of the head May 20, 2015 FINDINGS: BRAIN: Confluent reduced diffusion measuring up to 5.8 cm in LEFT frontal lobe, involving the insula and operculum with low ADC values. Non contiguous patchy reduced diffusion LEFT anterior frontal lobe. No susceptibility artifact to suggest acute hemorrhage. The ventricles and sulci are normal for patient's age. Small area RIGHT occipital lobe encephalomalacia. Old LEFT pontomedullary infarcts. Old bilateral basal ganglia lacunar infarcts. Symmetric basal ganglia susceptibility artifact most consistent with old hemorrhage. Patchy supratentorial and pontine white matter FLAIR T2 hyperintensities. Mild ventriculomegaly on the basis of global parenchymal brain volume loss. No  intraparenchymal mass effect, masses. No abnormal extra-axial fluid collections. VASCULAR: Normal major intracranial vascular flow voids present at skull base. SKULL AND UPPER CERVICAL SPINE: Empty sella. No suspicious calvarial bone marrow signal. Craniocervical junction maintained. SINUSES/ORBITS: Mild paranasal sinus mucosal thickening. The included ocular globes and orbital contents are non-suspicious. Status post LEFT scleral banding and ocular lens implant. OTHER: None. IMPRESSION: Acute small to moderate LEFT frontal lobe/ MCA territory infarct corresponding to perfusion abnormality. 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 Old basal ganglia and pontomedullary lacunar infarcts. Old RIGHT occipital lobe/ PCA territory infarct. Mild to moderate chronic small vessel ischemic disease. Mild atrophy. Electronically Signed   By: Awilda Metro M.D.   On: 02/06/2016 06:01   Ct Cerebral Perfusion W Contrast  Result Date: 02/05/2016 CLINICAL DATA:  Initial evaluation for acute right-sided weakness. EXAM: CT PERFUSION BRAIN TECHNIQUE: Multiphase CT imaging of the brain was performed following IV bolus contrast injection. Subsequent parametric perfusion maps were calculated using RAPID software. CONTRAST:  90 cc of Isovue 370. FINDINGS: CT Brain Perfusion Findings: CBF (<30%) Volume: 21 ccmL Perfusion (Tmax>6.0s) volume: 92 ccmL Mismatch Volume: 71 ccmL Infarction Location:Core infarct primarily involves the left frontal operculum/anterior left frontal lobe, with some extension towards the left insula and left temporal lobe. Please note that while the mismatch volume is  measured as 71 cc of this exam, there is felt to be some artifact on the T max perfusion images, with elevated T-max seen not only around the core infarct, but also within the right cerebral hemisphere as well as the right cerebellar hemisphere. Overall, while there is likely some penumbra about the area of core infarction, the recorded 71  cc is likely not accurate as to the volume of actual mismatch (which is likely significantly less, although difficult to accurately quantify on this exam). IMPRESSION: Acute small to moderate-sized left MCA territory infarct as above. Critical Value/emergent results were called by telephone at the time of interpretation on 02/05/2016 at 6:19 pm to Dr. Ritta Slot , who verbally acknowledged these results. Electronically Signed   By: Rise Mu M.D.   On: 02/05/2016 18:27   Dg Chest Port 1 View  Result Date: 02/05/2016 CLINICAL DATA:  Acute altered mental status. EXAM: PORTABLE CHEST 1 VIEW COMPARISON:  07/26/2015 FINDINGS: The patient has developed cardiomegaly with pulmonary vascular congestion and slight bilateral interstitial pulmonary edema. No acute bone abnormality. IMPRESSION: New cardiomegaly with pulmonary vascular congestion and slight bilateral interstitial pulmonary edema. Electronically Signed   By: Francene Boyers M.D.   On: 02/05/2016 16:01   Dg Swallowing Func-speech Pathology  Result Date: 02/06/2016 Objective Swallowing Evaluation: Type of Study: MBS-Modified Barium Swallow Study Patient Details Name: [name] MRN: [id number] Date of Birth: [birth year] Today's Date: 02/06/2016 Time: SLP Start Time (ACUTE ONLY): 1017-SLP Stop Time (ACUTE ONLY): 1033 SLP Time Calculation (min) (ACUTE ONLY): 16 min Past Medical History: Past Medical History: Diagnosis Date . BPH (benign prostatic hyperplasia)  . CHF (congestive heart failure) (HCC)  . Diabetes mellitus without complication (HCC)  . Diabetic retinopathy associated with type 2 diabetes mellitus (HCC)  . Hypertension  . Lymphedema  . OSA (obstructive sleep apnea)  . TIA (transient ischemic attack)  Past Surgical History: Past Surgical History: Procedure Laterality Date . BLADDER SURGERY   . CARDIAC CATHETERIZATION   HPI: Jeyden Coffelt a 56 y.o.malewith medical history significant for chronic CHF, insulin-dependent diabetes  mellitus, hypertension, depression, OSA, ulcerative colitis, prior CVA with minimal residual right-sided weakness who presents to the emergency department with dysarthria, confusion, lethargy, and right facial droop. Patient lives alone and family resides in [facility], [place]. Chest x-ray is notable for new cardiomegaly with pulmonary vascular congestion and slight bilateral interstitial edema. MRI acute small to moderate LEFT frontal lobe/ MCA territory infarct corresponding to perfusion abnormality. 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 Old basal ganglia. MBS recommended following BSE. No Data Recorded Assessment / Plan / Recommendation CHL IP CLINICAL IMPRESSIONS 02/06/2016 Therapy Diagnosis Moderate oral phase dysphagia;Mild pharyngeal phase dysphagia Clinical Impression Labial spillage, delayed transit and intermittent piecemeal pattern describe pt's oral function. Swallow initiation delayed to pyriform sinuses due to decreased sensation without penetration or aspiration with consecutive cup and straw sips and minimal vallecular residue. Upon clinical observation during bedside swallow and following MBS, pt has multiple swallows and intermittent mild throat clears with the appearance of difficulty however pt safe with consecutive larger sips. Risk for pocketing present therefore recommend Dys 2 texture and thin liquids, crush meds, straws allowed, sit upright and check right side oral cavity for pocketing. ST will continue to follow.     Impact on safety and function Mild aspiration risk   CHL IP TREATMENT RECOMMENDATION 02/06/2016 Treatment Recommendations Therapy as outlined in treatment plan below   Prognosis 02/06/2016 Prognosis for Safe Diet Advancement Good Barriers to  Reach Goals -- Barriers/Prognosis Comment -- CHL IP DIET RECOMMENDATION 02/06/2016 SLP Diet Recommendations Dysphagia 2 (Fine chop) solids;Thin liquid Liquid Administration via Straw;Cup Medication Administration Crushed  with puree Compensations Slow rate;Small sips/bites;Minimize environmental distractions;Lingual sweep for clearance of pocketing Postural Changes Seated upright at 90 degrees   CHL IP OTHER RECOMMENDATIONS 02/06/2016 Recommended Consults -- Oral Care Recommendations Oral care BID Other Recommendations --   CHL IP FOLLOW UP RECOMMENDATIONS 02/06/2016 Follow up Recommendations Inpatient Rehab   CHL IP FREQUENCY AND DURATION 02/06/2016 Speech Therapy Frequency (ACUTE ONLY) min 2x/week Treatment Duration 2 weeks      CHL IP ORAL PHASE 02/06/2016 Oral Phase Impaired Oral - Pudding Teaspoon -- Oral - Pudding Cup -- Oral - Honey Teaspoon -- Oral - Honey Cup -- Oral - Nectar Teaspoon -- Oral - Nectar Cup -- Oral - Nectar Straw -- Oral - Thin Teaspoon -- Oral - Thin Cup Delayed oral transit;Piecemeal swallowing Oral - Thin Straw Delayed oral transit;Piecemeal swallowing Oral - Puree -- Oral - Mech Soft -- Oral - Regular Delayed oral transit;Weak lingual manipulation Oral - Multi-Consistency -- Oral - Pill -- Oral Phase - Comment --  CHL IP PHARYNGEAL PHASE 02/06/2016 Pharyngeal Phase Impaired Pharyngeal- Pudding Teaspoon -- Pharyngeal -- Pharyngeal- Pudding Cup -- Pharyngeal -- Pharyngeal- Honey Teaspoon -- Pharyngeal -- Pharyngeal- Honey Cup -- Pharyngeal -- Pharyngeal- Nectar Teaspoon -- Pharyngeal -- Pharyngeal- Nectar Cup Delayed swallow initiation-pyriform sinuses Pharyngeal -- Pharyngeal- Nectar Straw -- Pharyngeal -- Pharyngeal- Thin Teaspoon -- Pharyngeal -- Pharyngeal- Thin Cup Delayed swallow initiation-pyriform sinuses;Pharyngeal residue - valleculae Pharyngeal -- Pharyngeal- Thin Straw Delayed swallow initiation-pyriform sinuses;Pharyngeal residue - valleculae Pharyngeal -- Pharyngeal- Puree -- Pharyngeal -- Pharyngeal- Mechanical Soft -- Pharyngeal -- Pharyngeal- Regular WFL Pharyngeal -- Pharyngeal- Multi-consistency -- Pharyngeal -- Pharyngeal- Pill -- Pharyngeal -- Pharyngeal Comment --  CHL IP CERVICAL ESOPHAGEAL  PHASE 02/06/2016 Cervical Esophageal Phase WFL Pudding Teaspoon -- Pudding Cup -- Honey Teaspoon -- Honey Cup -- Nectar Teaspoon -- Nectar Cup -- Nectar Straw -- Thin Teaspoon -- Thin Cup -- Thin Straw -- Puree -- Mechanical Soft -- Regular -- Multi-consistency -- Pill -- Cervical Esophageal Comment -- No flowsheet data found. Royce Macadamia 02/06/2016, 11:14 AM Breck Coons Lonell Face.Ed CCC-SLP Pager [phone]               Assessment/Plan: Diagnosis: left MCA infarct in an obese patient who was living alone but failing to thrive prior to admit 1. Does the need for close, 24 hr/day medical supervision in concert with the patient's rehab needs make it unreasonable for this patient to be served in a less intensive setting? Yes 2. Co-Morbidities requiring supervision/potential complications: dm, htn, chf 3. Due to bladder management, bowel management, safety, skin/wound care, disease management, medication administration, pain management and patient education, does the patient require 24 hr/day rehab nursing? Yes 4. Does the patient require coordinated care of a physician, rehab nurse, PT (1-2 hrs/day, 5 days/week), OT (1-2 hrs/day, 5 days/week) and SLP (1-2 hrs/day, 5 days/week) to address physical and functional deficits in the context of the above medical diagnosis(es)? Yes Addressing deficits in the following areas: balance, endurance, locomotion, strength, transferring, bowel/bladder control, bathing, dressing, feeding, grooming, toileting, cognition, speech, language, swallowing and psychosocial support 5. Can the patient actively participate in an intensive therapy program of at least 3 hrs of therapy per day at least 5 days per week? Yes 6. The potential for patient to make measurable gains while on inpatient rehab is good 7. Anticipated functional  outcomes upon discharge from inpatient rehab are modified independent and supervision  with PT, modified independent, supervision and min assist with  OT, modified independent and supervision with SLP. 8. Estimated rehab length of stay to reach the above functional goals is: 20-24 days 9. Does the patient have adequate social supports and living environment to accommodate these discharge functional goals? Yes 10. Anticipated D/C setting: Home 11. Anticipated post D/C treatments: HH therapy and Outpatient therapy 12. Overall Rehab/Functional Prognosis: good  RECOMMENDATIONS: This patient's condition is appropriate for continued rehabilitative care in the following setting: CIR Patient has agreed to participate in recommended program. Yes Note that insurance prior authorization may be required for reimbursement for recommended care.  Comment: I spoke with brother,patient at length. We discussed options moving forward. The most realistic plan would be discharge to an ALF after inpatient rehab admission...possibly a facility which has tiered levels of care. The brother would like him to live in the [place] area closer to his home in [facility].  Rehab Admissions Coordinator to follow up.  Thanks,  Ranelle Oyster, MD, [place] [facility]    Charlton Amor., PA-C 02/07/2016

## 2016-02-07 NOTE — Progress Notes (Signed)
I met with pt with his brother and sister in law at bedside to discuss overall plans for rehab venue once medical workup is complete. They are planning for pt to d/c to ALF after CIR admission in the [facility] area. Brother states Dr. Erlinda Hong is planning TEE and LOOP to complete medical work up. I will follow up tomorrow. [phone]

## 2016-02-07 NOTE — Progress Notes (Signed)
Speech Language Pathology Treatment: Dysphagia;Cognitive-Linquistic  Patient Details Name: [name] MRN: [id number] DOB: 1960-10-24 Today's Date: 02/07/2016 Time: 6381-7711 SLP Time Calculation (min) (ACUTE ONLY): 34 min  Assessment / Plan / Recommendation Clinical Impression  Pt's brother and sister-in-law present and report pt pocketing food. Oral care revealed pill residual in buccal cavities. Mild difficulty initiating suck with straw. No outward indications of airway compromise. Pt required mod tactile and verbal stimuli for smaller sips. Will downgrade to Dys 1 (puree)- pt and family in agreement.   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 Pt demonstrated use of over articulation and distinct pausing between words. Continue treat- he is great CIR speech candidate.    HPI HPI: [name] a 56 y.o.malewith medical history significant for chronic CHF, insulin-dependent diabetes mellitus, hypertension, depression, OSA, ulcerative colitis, prior CVA with minimal residual right-sided weakness who presents to the emergency department with dysarthria, confusion, lethargy, and right facial droop. Patient lives alone and family resides in [facility], [place]. Chest x-ray is notable for new cardiomegaly with pulmonary vascular congestion and slight bilateral interstitial edema. MRI acute small to moderate LEFT frontal lobe/ MCA territory infarct corresponding to perfusion abnormality. 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 Old basal ganglia.       SLP Plan  Continue with current plan of care     Recommendations  Diet recommendations: Thin liquid;Dysphagia 1 (puree) Liquids provided via: Cup;Straw Medication Administration: Crushed with puree Supervision: Patient able  to self feed;Full supervision/cueing for compensatory strategies Compensations: Slow rate;Small sips/bites;Minimize environmental distractions;Lingual sweep for clearance of pocketing Postural Changes and/or Swallow Maneuvers: Seated upright 90 degrees                General recommendations: Rehab consult Oral Care Recommendations: Oral care BID Follow up Recommendations: Inpatient Rehab Plan: Continue with current plan of care       GO                Royce Macadamia 02/07/2016, 10:53 AM Breck Coons Lonell Face.Ed [employer] [phone]

## 2016-02-07 NOTE — Progress Notes (Signed)
Hypoglycemic Event  CBG: 66  Treatment: 15 GM carbohydrate snack  Symptoms: None  Follow-up CBG: Time:0501 CBG Result:78  Possible Reasons for Event: Inadequate meal intake  Comments/MD notified:Yes, NP Maren Reamer    [place]-Asidi, Stepen Prins Efe

## 2016-02-07 NOTE — Progress Notes (Signed)
[facility] PROGRESS NOTE  [name] ZOX:[id number] DOB: Mar 31, 1960 DOA: 02/05/2016   PCP: [facility]     Assessment/Plan: Principal Problem:   Stroke (cerebrum) (HCC) Active Problems:   AKI (acute kidney injury) (HCC)   Essential hypertension   Acute on chronic diastolic CHF (congestive heart failure) (HCC)   Anemia of chronic disease   Diabetes mellitus, type II, insulin dependent (HCC)   OSA ? compliacne   Hypokalemia   Hypertensive urgency   Acute CVA (cerebrovascular accident) (HCC)   Cerebral infarction due to occlusion of left middle cerebral artery (HCC)   Hypertension   Right sided weakness   Acute on chronic congestive heart failure (HCC)   56 y.o.malewith medical history significant for chronic CHF, insulin-dependent diabetes mellitus, hypertension, depression, ulcerative colitis, prior CVA with minimal residual right-sided weakness, and obstructive sleep apnea who presents to the emergency department with dysarthria, confusion, lethargy, and right facial droop. Medical admission for further evaluation and management of acute CVA was advised. Patient will be admitted to the telemetry unit for ongoing evaluation and management of acute CVA.He did not receive IV t-PA due to being outside the window  MRI brain shows Acute small to moderate LEFT frontal lobe/ MCA territory infarct corresponding to perfusion abnormality.  Assessment and plan 1. Stroke:  Dominant left frontal/MCA territory infarct embolic pattern secondary to unknown source. Patient does have multiple stroke risk factors, however, cardiac embolic infarct cannot be ruled out.  Resultant  expressive aphasia, right hemiparesis  CTA head and neck L M3 occlusion, R pleural effusion, pulmonary edema.  CTP small to moderate size L MCA territory infarct  MRI  Small to moderate L frontal/MCA territory infarct. Old B BG, pontomedullary, R occipital/PCA infarcts.  2D Echo LV EF:  35%.Diffuse hypokinesis-cardiology consulted for further recommendations for CHF, also patient will need TEE with Loop recorder according to neurology  LE doppler no DVT  LDL 94  HgbA1c 6.4     Lovenox 40 mg sq daily for VTE prophylaxis  Diet Diet recommendations: Thin liquid;Dysphagia 1 (puree  No antithrombotic prior to admission, now on aspirin 300 mg suppository daily. Due to intracranial stenosis, neurology recommends DAPT with aspirin and Plavix for 3 months and then Plavix alone.  Patient counseled to be compliant with his antithrombotic medications  Ongoing aggressive stroke risk factor management  Therapy recommendations:     Disposition:  pending    2. Acute on chronic diastolic CHF  - Initially found to have BLE edema, JVD, crackles, BNP 3370, CXR with interstitial edema  - TTE (04/13/15) with EF 50-55%, grade 2 diastolic dysfunction, severe concentric hypertrophy, moderate-severe TR, mild AR, and moderate pulmonary HTN  - He has Lasix, Bumex, and Zaroxolyn, Aldactone, Coreg, and lisinopril on home med list - Received Lasix 80 IV q12h 1 day. Switch to by mouth diuretics - Hold lisinopril given AKI, resume as tolerated  2-D echo pending    3. Hypertension with hypertensive urgency - BP 200/140 in ED, likely exacerbated by acute CHF and acute cerebral artery occlusion  - Given the acute ischemic CVA, will treat only for SBP >220 or DBP >110 for now in acute phase  - His Norvasc and Coreg will be held initially, resumed as appropriate   4. Insulin-dependent DM  - A1c was 5.9% in 2016. Accu-Chek stable - Reduced dose of Lantus as CABG somewhat low this morning - He is currently NPO based on SLP eval    5. Hypokalemia -  Serum potassium 3.3 on admission , replete - Monitor on telemetry  - Follow BMP qAM during diuresis    6. Normocytic anemia  - Hgb 12.1 on admission  - Stable relative to priors with no sign of bleeding   7. OSA - CPAP qHS     DVT  prophylaxsis Lovenox  Code Status:  Full code    Family Communication: Discussed in detail with the patient/brother, all imaging results, lab results explained to the patient   Disposition Plan:  2 to 3 days . CIR?     Consultants:  Neurology  Cardiology  Procedures:  None  Antibiotics: Anti-infectives    None         HPI/Subjective: Patient is awake, follows commands  Objective: Vitals:   02/06/16 2059 02/06/16 2300 02/07/16 0114 02/07/16 0700  BP: (!) 177/106  (!) 154/106   Pulse: 80  63   Resp: 16 16 18    Temp: 98.2 F (36.8 C)  97.5 F (36.4 C)   TempSrc: Oral  Oral   SpO2: 100%  100%   Weight:    104.1 kg (229 lb 8 oz)  Height:        Intake/Output Summary (Last 24 hours) at 02/07/16 0851 Last data filed at 02/07/16 0452  Gross per 24 hour  Intake              360 ml  Output             2100 ml  Net            -1740 ml    Exam:  Examination:  General exam: Appears calm and comfortable  Respiratory system: Clear to auscultation. Respiratory effort normal. Cardiovascular system: S1 & S2 heard, RRR. No JVD, murmurs, rubs, gallops or clicks. No pedal edema. Gastrointestinal system: Abdomen is nondistended, soft and nontender. No organomegaly or masses felt. Normal bowel sounds heard. Central nervous system: sleepy drowsy, but arousable and following some commands. Still aphasic with right-sided weakness      Data Reviewed: I have personally reviewed following labs and imaging studies  Micro Results Recent Results (from the past 240 hour(s))  Blood culture (routine x 2)     Status: None (Preliminary result)   Collection Time: 02/05/16  3:32 PM  Result Value Ref Range Status   Specimen Description BLOOD LEFT HAND AEROBIC BOTTLE ONLY  Final   Special Requests 5CC  Final   Culture NO GROWTH < 24 HOURS  Final   Report Status PENDING  Incomplete  Blood culture (routine x 2)     Status: None (Preliminary result)   Collection Time: 02/05/16   3:37 PM  Result Value Ref Range Status   Specimen Description RIGHT ANTECUBITAL AEROBIC BOTTLE ONLY  Final   Special Requests 5CC  Final   Culture NO GROWTH < 24 HOURS  Final   Report Status PENDING  Incomplete  Urine culture     Status: None   Collection Time: 02/05/16  4:24 PM  Result Value Ref Range Status   Specimen Description URINE, CATHETERIZED  Final   Special Requests NONE  Final   Culture NO GROWTH  Final   Report Status 02/06/2016 FINAL  Final    Radiology Reports Ct Angio Head W Or Wo Contrast  Result Date: 02/05/2016 CLINICAL DATA:  Initial evaluation for acute right-sided weakness, slurred speech, aphasia. EXAM: CT ANGIOGRAPHY HEAD AND NECK TECHNIQUE: Multidetector CT imaging of the head and neck was performed using the standard protocol during  bolus administration of intravenous contrast. Multiplanar CT image reconstructions and MIPs were obtained to evaluate the vascular anatomy. Carotid stenosis measurements (when applicable) are obtained utilizing NASCET criteria, using the distal internal carotid diameter as the denominator. CONTRAST:  90 cc of Isovue 370. COMPARISON:  Prior CT from earlier the same day. FINDINGS: CT HEAD FINDINGS Brain: Atrophy with chronic microvascular ischemic disease again noted. Remote lacunar infarcts within the bilateral basal ganglia. Now evident is subtle evolving hypodensity involving the left insular cortex extending towards the left frontal operculum (series 201, image 18). This is more evident as compared to previous examination performed earlier the same day. No significant mass effect. No acute intracranial hemorrhage. Hyperdensity at the level of the left sylvian fissure suspicious for thrombus (series 201, image 19). No mass lesion, midline shift, or mass effect. No hydrocephalus. No extra-axial fluid collection. Vascular: Vascular calcifications within the carotid siphons. Hyperdensity at the level the left sylvian fissure suspicious for  thrombus (series 201, image 19). No other hyperdense vessel. Skull: Scalp soft tissues within normal limits.  Calvarium intact. Sinuses: Globes and orbital soft tissues within normal limits. Patient is status post scleral banding on the left. Mild opacity within the right ethmoidal air cells. Paranasal sinuses are otherwise clear. No mastoid effusion. CTA NECK FINDINGS Aortic arch: Visualized aortic arch of normal caliber with normal branch pattern. Scattered atheromatous plaque within the arch itself and about the origin of the great vessels without flow-limiting stenosis. Visualized subclavian arteries are widely patent. Right carotid system: Right common carotid artery patent from its origin to the bifurcation. Scattered calcified plaque about the right bifurcation without flow-limiting stenosis. Right ICA patent distally to the skullbase without stenosis, dissection, or occlusion. Left carotid system: Left common carotid artery patent from its origin to the bifurcation. Scattered calcified plaque about the left bifurcation without flow-limiting stenosis. Left ICA patent distally to the skullbase without stenosis, dissection, or occlusion. Vertebral arteries: Both of the vertebral arteries arise from the subclavian arteries. The the vertebral arteries widely patent without stenosis, dissection, or occlusion. Focal plaque noted at the origin of the right vertebral artery without high-grade stenosis. Skeleton: No acute osseous abnormality. No worrisome lytic or blastic osseous lesions. Other neck: Visualized soft tissues of the neck demonstrate no acute abnormality. Upper chest: Visualized upper mediastinum within normal limits. Layering right pleural effusion. Probable smaller left effusion. Scattered interlobular septal thickening within the partially visualized lungs, suggesting edema. Review of the MIP images confirms the above findings CTA HEAD FINDINGS Anterior circulation: Petrous segments patent  bilaterally. Scattered calcified atheromatous plaque within the cavernous/ supraclinoid ICAs with mild to moderate multifocal narrowing. ICA termini patent. Left A1 segment dominant and widely patent. Right A1 segment hypoplastic but patent as well. Anterior communicating artery normal. Anterior cerebral arteries patent to their distal aspects. Right M1 segment patent without stenosis or occlusion. Right MCA bifurcation normal. Right MCA branches well opacified to their distal aspects. Left M1 segment patent without stenosis or occlusion. Left MCA bifurcation normal. No proximal M2 branch occlusion. There is abrupt occlusion of a left M3 branch, middle division (series 602, image 80). This corresponds with previously seen hyperdensity on noncontrast portion of this exam. No other arterial branch occlusion. Left MCA branches otherwise patent. Posterior circulation: Scattered plaque within the V4 segments bilaterally, right greater than left with mild multifocal narrowing. Posterior inferior cerebral arteries patent bilaterally. Basilar artery widely patent. Superior cerebellar arteries patent bilaterally. Both of the posterior cerebral arteries supplied mainly via the basilar artery  and are patent to their distal aspects. Venous sinuses: Patent. Anatomic variants: No significant anatomic variant. No aneurysm or vascular malformation. Delayed phase: Not performed. Review of the MIP images confirms the above findings IMPRESSION: 1. 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 2. Scattered atheromatous plaque throughout the carotid siphons with mild to moderate multifocal narrowing. 3. Atheromatous plaque about the carotid bifurcations bilaterally without flow-limiting stenosis. 4. Large layering right pleural effusion, with smaller left pleural effusion. Interlobular septal thickening suggestive of pulmonary edema. Critical Value/emergent results were  called by telephone at the time of interpretation on 02/05/2016 at 6:19 pm to Dr. Ritta Slot , who verbally acknowledged these results. Electronically Signed   By: Rise Mu M.D.   On: 02/05/2016 19:04   Ct Head Wo Contrast  Result Date: 02/05/2016 CLINICAL DATA:  Altered mental status, aphasia, right-sided weakness EXAM: CT HEAD WITHOUT CONTRAST TECHNIQUE: Contiguous axial images were obtained from the base of the skull through the vertex without intravenous contrast. COMPARISON:  MRI 05/19/2000 FINDINGS: Brain: Old lacunar infarct in the left basal ganglia. Old infarct and encephalomalacia in the right occipital lobe. No acute intracranial abnormality. Specifically, no hemorrhage, hydrocephalus, mass lesion, acute infarction, or significant intracranial injury. Vascular: No hyperdense vessel or unexpected calcification. Skull: No acute calvarial abnormality. Sinuses/Orbits: Visualized paranasal sinuses and mastoids clear. Orbital soft tissues unremarkable. Other: None IMPRESSION: Old left basal ganglia and right occipital infarct. No acute intracranial abnormality. Electronically Signed   By: Charlett Nose M.D.   On: 02/05/2016 15:25   Ct Angio Neck W Or Wo Contrast  Result Date: 02/05/2016 CLINICAL DATA:  Initial evaluation for acute right-sided weakness, slurred speech, aphasia. EXAM: CT ANGIOGRAPHY HEAD AND NECK TECHNIQUE: Multidetector CT imaging of the head and neck was performed using the standard protocol during bolus administration of intravenous contrast. Multiplanar CT image reconstructions and MIPs were obtained to evaluate the vascular anatomy. Carotid stenosis measurements (when applicable) are obtained utilizing NASCET criteria, using the distal internal carotid diameter as the denominator. CONTRAST:  90 cc of Isovue 370. COMPARISON:  Prior CT from earlier the same day. FINDINGS: CT HEAD FINDINGS Brain: Atrophy with chronic microvascular ischemic disease again noted. Remote  lacunar infarcts within the bilateral basal ganglia. Now evident is subtle evolving hypodensity involving the left insular cortex extending towards the left frontal operculum (series 201, image 18). This is more evident as compared to previous examination performed earlier the same day. No significant mass effect. No acute intracranial hemorrhage. Hyperdensity at the level of the left sylvian fissure suspicious for thrombus (series 201, image 19). No mass lesion, midline shift, or mass effect. No hydrocephalus. No extra-axial fluid collection. Vascular: Vascular calcifications within the carotid siphons. Hyperdensity at the level the left sylvian fissure suspicious for thrombus (series 201, image 19). No other hyperdense vessel. Skull: Scalp soft tissues within normal limits.  Calvarium intact. Sinuses: Globes and orbital soft tissues within normal limits. Patient is status post scleral banding on the left. Mild opacity within the right ethmoidal air cells. Paranasal sinuses are otherwise clear. No mastoid effusion. CTA NECK FINDINGS Aortic arch: Visualized aortic arch of normal caliber with normal branch pattern. Scattered atheromatous plaque within the arch itself and about the origin of the great vessels without flow-limiting stenosis. Visualized subclavian arteries are widely patent. Right carotid system: Right common carotid artery patent from its origin to the bifurcation. Scattered calcified plaque about the right bifurcation without flow-limiting stenosis.  Right ICA patent distally to the skullbase without stenosis, dissection, or occlusion. Left carotid system: Left common carotid artery patent from its origin to the bifurcation. Scattered calcified plaque about the left bifurcation without flow-limiting stenosis. Left ICA patent distally to the skullbase without stenosis, dissection, or occlusion. Vertebral arteries: Both of the vertebral arteries arise from the subclavian arteries. The the vertebral  arteries widely patent without stenosis, dissection, or occlusion. Focal plaque noted at the origin of the right vertebral artery without high-grade stenosis. Skeleton: No acute osseous abnormality. No worrisome lytic or blastic osseous lesions. Other neck: Visualized soft tissues of the neck demonstrate no acute abnormality. Upper chest: Visualized upper mediastinum within normal limits. Layering right pleural effusion. Probable smaller left effusion. Scattered interlobular septal thickening within the partially visualized lungs, suggesting edema. Review of the MIP images confirms the above findings CTA HEAD FINDINGS Anterior circulation: Petrous segments patent bilaterally. Scattered calcified atheromatous plaque within the cavernous/ supraclinoid ICAs with mild to moderate multifocal narrowing. ICA termini patent. Left A1 segment dominant and widely patent. Right A1 segment hypoplastic but patent as well. Anterior communicating artery normal. Anterior cerebral arteries patent to their distal aspects. Right M1 segment patent without stenosis or occlusion. Right MCA bifurcation normal. Right MCA branches well opacified to their distal aspects. Left M1 segment patent without stenosis or occlusion. Left MCA bifurcation normal. No proximal M2 branch occlusion. There is abrupt occlusion of a left M3 branch, middle division (series 602, image 80). This corresponds with previously seen hyperdensity on noncontrast portion of this exam. No other arterial branch occlusion. Left MCA branches otherwise patent. Posterior circulation: Scattered plaque within the V4 segments bilaterally, right greater than left with mild multifocal narrowing. Posterior inferior cerebral arteries patent bilaterally. Basilar artery widely patent. Superior cerebellar arteries patent bilaterally. Both of the posterior cerebral arteries supplied mainly via the basilar artery and are patent to their distal aspects. Venous sinuses: Patent. Anatomic  variants: No significant anatomic variant. No aneurysm or vascular malformation. Delayed phase: Not performed. Review of the MIP images confirms the above findings IMPRESSION: 1. 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 2. Scattered atheromatous plaque throughout the carotid siphons with mild to moderate multifocal narrowing. 3. Atheromatous plaque about the carotid bifurcations bilaterally without flow-limiting stenosis. 4. Large layering right pleural effusion, with smaller left pleural effusion. Interlobular septal thickening suggestive of pulmonary edema. Critical Value/emergent results were called by telephone at the time of interpretation on 02/05/2016 at 6:19 pm to Dr. Ritta Slot , who verbally acknowledged these results. Electronically Signed   By: Rise Mu M.D.   On: 02/05/2016 19:04   Mr Brain Wo Contrast  Result Date: 02/06/2016 CLINICAL DATA:  Acute on chronic RIGHT-sided weakness, aphasia. Follow-up M3 occlusion. Assess stroke versus PRES. History of hypertension and diabetes. EXAM: MRI HEAD WITHOUT CONTRAST TECHNIQUE: Multiplanar, multiecho pulse sequences of the brain and surrounding structures were obtained without intravenous contrast. COMPARISON:  CT HEAD February 05, 2016 and MRI of the head May 20, 2015 FINDINGS: BRAIN: Confluent reduced diffusion measuring up to 5.8 cm in LEFT frontal lobe, involving the insula and operculum with low ADC values. Non contiguous patchy reduced diffusion LEFT anterior frontal lobe. No susceptibility artifact to suggest acute hemorrhage. The ventricles and sulci are normal for patient's age. Small area RIGHT occipital lobe encephalomalacia. Old LEFT pontomedullary infarcts. Old bilateral basal ganglia lacunar infarcts. Symmetric basal ganglia susceptibility artifact most consistent with old hemorrhage. Patchy  supratentorial and pontine white matter FLAIR T2 hyperintensities.  Mild ventriculomegaly on the basis of global parenchymal brain volume loss. No intraparenchymal mass effect, masses. No abnormal extra-axial fluid collections. VASCULAR: Normal major intracranial vascular flow voids present at skull base. SKULL AND UPPER CERVICAL SPINE: Empty sella. No suspicious calvarial bone marrow signal. Craniocervical junction maintained. SINUSES/ORBITS: Mild paranasal sinus mucosal thickening. The included ocular globes and orbital contents are non-suspicious. Status post LEFT scleral banding and ocular lens implant. OTHER: None. IMPRESSION: Acute small to moderate LEFT frontal lobe/ MCA territory infarct corresponding to perfusion abnormality. 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 Old basal ganglia and pontomedullary lacunar infarcts. Old RIGHT occipital lobe/ PCA territory infarct. Mild to moderate chronic small vessel ischemic disease. Mild atrophy. Electronically Signed   By: Awilda Metro M.D.   On: 02/06/2016 06:01   Ct Cerebral Perfusion W Contrast  Result Date: 02/05/2016 CLINICAL DATA:  Initial evaluation for acute right-sided weakness. EXAM: CT PERFUSION BRAIN TECHNIQUE: Multiphase CT imaging of the brain was performed following IV bolus contrast injection. Subsequent parametric perfusion maps were calculated using RAPID software. CONTRAST:  90 cc of Isovue 370. FINDINGS: CT Brain Perfusion Findings: CBF (<30%) Volume: 21 ccmL Perfusion (Tmax>6.0s) volume: 92 ccmL Mismatch Volume: 71 ccmL Infarction Location:Core infarct primarily involves the left frontal operculum/anterior left frontal lobe, with some extension towards the left insula and left temporal lobe. Please note that while the mismatch volume is measured as 71 cc of this exam, there is felt to be some artifact on the T max perfusion images, with elevated T-max seen not only around the core infarct, but also within the right cerebral hemisphere as well as the right cerebellar hemisphere. Overall, while  there is likely some penumbra about the area of core infarction, the recorded 71 cc is likely not accurate as to the volume of actual mismatch (which is likely significantly less, although difficult to accurately quantify on this exam). IMPRESSION: Acute small to moderate-sized left MCA territory infarct as above. Critical Value/emergent results were called by telephone at the time of interpretation on 02/05/2016 at 6:19 pm to Dr. Ritta Slot , who verbally acknowledged these results. Electronically Signed   By: Rise Mu M.D.   On: 02/05/2016 18:27   Dg Chest Port 1 View  Result Date: 02/05/2016 CLINICAL DATA:  Acute altered mental status. EXAM: PORTABLE CHEST 1 VIEW COMPARISON:  07/26/2015 FINDINGS: The patient has developed cardiomegaly with pulmonary vascular congestion and slight bilateral interstitial pulmonary edema. No acute bone abnormality. IMPRESSION: New cardiomegaly with pulmonary vascular congestion and slight bilateral interstitial pulmonary edema. Electronically Signed   By: Francene Boyers M.D.   On: 02/05/2016 16:01   Dg Swallowing Func-speech Pathology  Result Date: 02/06/2016 Objective Swallowing Evaluation: Type of Study: MBS-Modified Barium Swallow Study Patient Details Name: Delynn Pursley MRN: [id number] Date of Birth: Dec 24, 1960 Today's Date: 02/06/2016 Time: SLP Start Time (ACUTE ONLY): 1017-SLP Stop Time (ACUTE ONLY): 1033 SLP Time Calculation (min) (ACUTE ONLY): 16 min Past Medical History: Past Medical History: Diagnosis Date . BPH (benign prostatic hyperplasia)  . CHF (congestive heart failure) (HCC)  . Diabetes mellitus without complication (HCC)  . Diabetic retinopathy associated with type 2 diabetes mellitus (HCC)  . Hypertension  . Lymphedema  . OSA (obstructive sleep apnea)  . TIA (transient ischemic attack)  Past Surgical History: Past Surgical History: Procedure Laterality Date . BLADDER SURGERY   . CARDIAC CATHETERIZATION   HPI: Avon Mergenthaler a 56  y.o.malewith medical history significant for chronic CHF,  insulin-dependent diabetes mellitus, hypertension, depression, OSA, ulcerative colitis, prior CVA with minimal residual right-sided weakness who presents to the emergency department with dysarthria, confusion, lethargy, and right facial droop. Patient lives alone and family resides in [facility] [address], [place] and [place]. Chest x-ray is notable for new cardiomegaly with pulmonary vascular congestion and slight bilateral interstitial edema. MRI acute small to moderate LEFT frontal lobe/ MCA territory infarct corresponding to perfusion abnormality. 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 Old basal ganglia. MBS recommended following BSE. No Data Recorded Assessment / Plan / Recommendation CHL IP CLINICAL IMPRESSIONS 02/06/2016 Therapy Diagnosis Moderate oral phase dysphagia;Mild pharyngeal phase dysphagia Clinical Impression Labial spillage, delayed transit and intermittent piecemeal pattern describe pt's oral function. Swallow initiation delayed to pyriform sinuses due to decreased sensation without penetration or aspiration with consecutive cup and straw sips and minimal vallecular residue. Upon clinical observation during bedside swallow and following MBS, pt has multiple swallows and intermittent mild throat clears with the appearance of difficulty however pt safe with consecutive larger sips. Risk for pocketing present therefore recommend Dys 2 texture and thin liquids, crush meds, straws allowed, sit upright and check right side oral cavity for pocketing. ST will continue to follow.     Impact on safety and function Mild aspiration risk   CHL IP TREATMENT RECOMMENDATION 02/06/2016 Treatment Recommendations Therapy as outlined in treatment plan below   Prognosis 02/06/2016 Prognosis for Safe Diet Advancement Good Barriers to Reach Goals -- Barriers/Prognosis Comment -- CHL IP DIET RECOMMENDATION 02/06/2016 SLP Diet Recommendations Dysphagia 2 (Fine chop)  solids;Thin liquid Liquid Administration via Straw;Cup Medication Administration Crushed with puree Compensations Slow rate;Small sips/bites;Minimize environmental distractions;Lingual sweep for clearance of pocketing Postural Changes Seated upright at 90 degrees   CHL IP OTHER RECOMMENDATIONS 02/06/2016 Recommended Consults -- Oral Care Recommendations Oral care BID Other Recommendations --   CHL IP FOLLOW UP RECOMMENDATIONS 02/06/2016 Follow up Recommendations Inpatient Rehab   CHL IP FREQUENCY AND DURATION 02/06/2016 Speech Therapy Frequency (ACUTE ONLY) min 2x/week Treatment Duration 2 weeks      CHL IP ORAL PHASE 02/06/2016 Oral Phase Impaired Oral - Pudding Teaspoon -- Oral - Pudding Cup -- Oral - Honey Teaspoon -- Oral - Honey Cup -- Oral - Nectar Teaspoon -- Oral - Nectar Cup -- Oral - Nectar Straw -- Oral - Thin Teaspoon -- Oral - Thin Cup Delayed oral transit;Piecemeal swallowing Oral - Thin Straw Delayed oral transit;Piecemeal swallowing Oral - Puree -- Oral - Mech Soft -- Oral - Regular Delayed oral transit;Weak lingual manipulation Oral - Multi-Consistency -- Oral - Pill -- Oral Phase - Comment --  CHL IP PHARYNGEAL PHASE 02/06/2016 Pharyngeal Phase Impaired Pharyngeal- Pudding Teaspoon -- Pharyngeal -- Pharyngeal- Pudding Cup -- Pharyngeal -- Pharyngeal- Honey Teaspoon -- Pharyngeal -- Pharyngeal- Honey Cup -- Pharyngeal -- Pharyngeal- Nectar Teaspoon -- Pharyngeal -- Pharyngeal- Nectar Cup Delayed swallow initiation-pyriform sinuses Pharyngeal -- Pharyngeal- Nectar Straw -- Pharyngeal -- Pharyngeal- Thin Teaspoon -- Pharyngeal -- Pharyngeal- Thin Cup Delayed swallow initiation-pyriform sinuses;Pharyngeal residue - valleculae Pharyngeal -- Pharyngeal- Thin Straw Delayed swallow initiation-pyriform sinuses;Pharyngeal residue - valleculae Pharyngeal -- Pharyngeal- Puree -- Pharyngeal -- Pharyngeal- Mechanical Soft -- Pharyngeal -- Pharyngeal- Regular WFL Pharyngeal -- Pharyngeal- Multi-consistency -- Pharyngeal --  Pharyngeal- Pill -- Pharyngeal -- Pharyngeal Comment --  CHL IP CERVICAL ESOPHAGEAL PHASE 02/06/2016 Cervical Esophageal Phase WFL Pudding Teaspoon -- Pudding Cup -- Honey Teaspoon -- Honey Cup -- Nectar Teaspoon -- Nectar Cup -- Nectar Straw -- Thin Teaspoon -- Thin Cup -- Thin Straw -- Puree --  Mechanical Soft -- Regular -- Multi-consistency -- Pill -- Cervical Esophageal Comment -- No flowsheet data found. Royce Macadamia 02/06/2016, 11:14 AM Breck Coons Lonell Face.Ed CCC-SLP Pager [phone]                CBC  Recent Labs Lab 02/05/16 1531 02/05/16 1532  WBC  --  6.3  HGB 12.6* 12.1*  HCT 37.0* 37.0*  PLT  --  230  MCV  --  87.7  MCH  --  28.7  MCHC  --  32.7  RDW  --  16.1*  LYMPHSABS  --  1.0  MONOABS  --  0.3  EOSABS  --  0.1  BASOSABS  --  0.0    Chemistries   Recent Labs Lab 02/05/16 1531 02/05/16 1532 02/06/16 0429 02/07/16 0730  NA 143 143 145 143  K 3.6 3.3* 3.8 3.4*  CL 107 111 110 110  CO2  --  25 24 28   GLUCOSE 104* 105* 112* 70  BUN 33* 24* 23* 21*  CREATININE 1.50* 1.48* 1.45* 1.58*  CALCIUM  --  9.1 8.9 8.7*  MG  --   --  1.9  --   AST  --  42*  --   --   ALT  --  27  --   --   ALKPHOS  --  227*  --   --   BILITOT  --  0.9  --   --    ------------------------------------------------------------------------------------------------------------------ estimated creatinine clearance is 65.6 mL/min (by C-G formula based on SCr of 1.58 mg/dL (H)). ------------------------------------------------------------------------------------------------------------------  Recent Labs  02/05/16 2200  HGBA1C 6.4*   ------------------------------------------------------------------------------------------------------------------  Recent Labs  02/06/16 0429  CHOL 157  HDL 47  LDLCALC 94  TRIG 80  CHOLHDL 3.3   ------------------------------------------------------------------------------------------------------------------ No results for input(s): TSH,  T4TOTAL, T3FREE, THYROIDAB in the last 72 hours.  Invalid input(s): FREET3 ------------------------------------------------------------------------------------------------------------------ No results for input(s): VITAMINB12, FOLATE, FERRITIN, TIBC, IRON, RETICCTPCT in the last 72 hours.  Coagulation profile  Recent Labs Lab 02/05/16 1532  INR 1.18    No results for input(s): DDIMER in the last 72 hours.  Cardiac Enzymes No results for input(s): CKMB, TROPONINI, MYOGLOBIN in the last 168 hours.  Invalid input(s): CK ------------------------------------------------------------------------------------------------------------------ Invalid input(s): POCBNP   CBG:  Recent Labs Lab 02/06/16 2056 02/07/16 0001 02/07/16 0433 02/07/16 0501 02/07/16 0733  GLUCAP 103* 106* 66 78 67       Studies: Ct Angio Head W Or Wo Contrast  Result Date: 02/05/2016 CLINICAL DATA:  Initial evaluation for acute right-sided weakness, slurred speech, aphasia. EXAM: CT ANGIOGRAPHY HEAD AND NECK TECHNIQUE: Multidetector CT imaging of the head and neck was performed using the standard protocol during bolus administration of intravenous contrast. Multiplanar CT image reconstructions and MIPs were obtained to evaluate the vascular anatomy. Carotid stenosis measurements (when applicable) are obtained utilizing NASCET criteria, using the distal internal carotid diameter as the denominator. CONTRAST:  90 cc of Isovue 370. COMPARISON:  Prior CT from earlier the same day. FINDINGS: CT HEAD FINDINGS Brain: Atrophy with chronic microvascular ischemic disease again noted. Remote lacunar infarcts within the bilateral basal ganglia. Now evident is subtle evolving hypodensity involving the left insular cortex extending towards the left frontal operculum (series 201, image 18). This is more evident as compared to previous examination performed earlier the same day. No significant mass effect. No acute intracranial  hemorrhage. Hyperdensity at the level of the left sylvian fissure suspicious for thrombus (series 201, image 19). No  mass lesion, midline shift, or mass effect. No hydrocephalus. No extra-axial fluid collection. Vascular: Vascular calcifications within the carotid siphons. Hyperdensity at the level the left sylvian fissure suspicious for thrombus (series 201, image 19). No other hyperdense vessel. Skull: Scalp soft tissues within normal limits.  Calvarium intact. Sinuses: Globes and orbital soft tissues within normal limits. Patient is status post scleral banding on the left. Mild opacity within the right ethmoidal air cells. Paranasal sinuses are otherwise clear. No mastoid effusion. CTA NECK FINDINGS Aortic arch: Visualized aortic arch of normal caliber with normal branch pattern. Scattered atheromatous plaque within the arch itself and about the origin of the great vessels without flow-limiting stenosis. Visualized subclavian arteries are widely patent. Right carotid system: Right common carotid artery patent from its origin to the bifurcation. Scattered calcified plaque about the right bifurcation without flow-limiting stenosis. Right ICA patent distally to the skullbase without stenosis, dissection, or occlusion. Left carotid system: Left common carotid artery patent from its origin to the bifurcation. Scattered calcified plaque about the left bifurcation without flow-limiting stenosis. Left ICA patent distally to the skullbase without stenosis, dissection, or occlusion. Vertebral arteries: Both of the vertebral arteries arise from the subclavian arteries. The the vertebral arteries widely patent without stenosis, dissection, or occlusion. Focal plaque noted at the origin of the right vertebral artery without high-grade stenosis. Skeleton: No acute osseous abnormality. No worrisome lytic or blastic osseous lesions. Other neck: Visualized soft tissues of the neck demonstrate no acute abnormality. Upper chest:  Visualized upper mediastinum within normal limits. Layering right pleural effusion. Probable smaller left effusion. Scattered interlobular septal thickening within the partially visualized lungs, suggesting edema. Review of the MIP images confirms the above findings CTA HEAD FINDINGS Anterior circulation: Petrous segments patent bilaterally. Scattered calcified atheromatous plaque within the cavernous/ supraclinoid ICAs with mild to moderate multifocal narrowing. ICA termini patent. Left A1 segment dominant and widely patent. Right A1 segment hypoplastic but patent as well. Anterior communicating artery normal. Anterior cerebral arteries patent to their distal aspects. Right M1 segment patent without stenosis or occlusion. Right MCA bifurcation normal. Right MCA branches well opacified to their distal aspects. Left M1 segment patent without stenosis or occlusion. Left MCA bifurcation normal. No proximal M2 branch occlusion. There is abrupt occlusion of a left M3 branch, middle division (series 602, image 80). This corresponds with previously seen hyperdensity on noncontrast portion of this exam. No other arterial branch occlusion. Left MCA branches otherwise patent. Posterior circulation: Scattered plaque within the V4 segments bilaterally, right greater than left with mild multifocal narrowing. Posterior inferior cerebral arteries patent bilaterally. Basilar artery widely patent. Superior cerebellar arteries patent bilaterally. Both of the posterior cerebral arteries supplied mainly via the basilar artery and are patent to their distal aspects. Venous sinuses: Patent. Anatomic variants: No significant anatomic variant. No aneurysm or vascular malformation. Delayed phase: Not performed. Review of the MIP images confirms the above findings IMPRESSION: 1. 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 2. Scattered atheromatous plaque throughout the  carotid siphons with mild to moderate multifocal narrowing. 3. Atheromatous plaque about the carotid bifurcations bilaterally without flow-limiting stenosis. 4. Large layering right pleural effusion, with smaller left pleural effusion. Interlobular septal thickening suggestive of pulmonary edema. Critical Value/emergent results were called by telephone at the time of interpretation on 02/05/2016 at 6:19 pm to Dr. Ritta Slot , who verbally acknowledged these results. Electronically Signed   By: Janell Quiet.D.  On: 02/05/2016 19:04   Ct Head Wo Contrast  Result Date: 02/05/2016 CLINICAL DATA:  Altered mental status, aphasia, right-sided weakness EXAM: CT HEAD WITHOUT CONTRAST TECHNIQUE: Contiguous axial images were obtained from the base of the skull through the vertex without intravenous contrast. COMPARISON:  MRI 05/19/2000 FINDINGS: Brain: Old lacunar infarct in the left basal ganglia. Old infarct and encephalomalacia in the right occipital lobe. No acute intracranial abnormality. Specifically, no hemorrhage, hydrocephalus, mass lesion, acute infarction, or significant intracranial injury. Vascular: No hyperdense vessel or unexpected calcification. Skull: No acute calvarial abnormality. Sinuses/Orbits: Visualized paranasal sinuses and mastoids clear. Orbital soft tissues unremarkable. Other: None IMPRESSION: Old left basal ganglia and right occipital infarct. No acute intracranial abnormality. Electronically Signed   By: Charlett Nose M.D.   On: 02/05/2016 15:25   Ct Angio Neck W Or Wo Contrast  Result Date: 02/05/2016 CLINICAL DATA:  Initial evaluation for acute right-sided weakness, slurred speech, aphasia. EXAM: CT ANGIOGRAPHY HEAD AND NECK TECHNIQUE: Multidetector CT imaging of the head and neck was performed using the standard protocol during bolus administration of intravenous contrast. Multiplanar CT image reconstructions and MIPs were obtained to evaluate the vascular anatomy.  Carotid stenosis measurements (when applicable) are obtained utilizing NASCET criteria, using the distal internal carotid diameter as the denominator. CONTRAST:  90 cc of Isovue 370. COMPARISON:  Prior CT from earlier the same day. FINDINGS: CT HEAD FINDINGS Brain: Atrophy with chronic microvascular ischemic disease again noted. Remote lacunar infarcts within the bilateral basal ganglia. Now evident is subtle evolving hypodensity involving the left insular cortex extending towards the left frontal operculum (series 201, image 18). This is more evident as compared to previous examination performed earlier the same day. No significant mass effect. No acute intracranial hemorrhage. Hyperdensity at the level of the left sylvian fissure suspicious for thrombus (series 201, image 19). No mass lesion, midline shift, or mass effect. No hydrocephalus. No extra-axial fluid collection. Vascular: Vascular calcifications within the carotid siphons. Hyperdensity at the level the left sylvian fissure suspicious for thrombus (series 201, image 19). No other hyperdense vessel. Skull: Scalp soft tissues within normal limits.  Calvarium intact. Sinuses: Globes and orbital soft tissues within normal limits. Patient is status post scleral banding on the left. Mild opacity within the right ethmoidal air cells. Paranasal sinuses are otherwise clear. No mastoid effusion. CTA NECK FINDINGS Aortic arch: Visualized aortic arch of normal caliber with normal branch pattern. Scattered atheromatous plaque within the arch itself and about the origin of the great vessels without flow-limiting stenosis. Visualized subclavian arteries are widely patent. Right carotid system: Right common carotid artery patent from its origin to the bifurcation. Scattered calcified plaque about the right bifurcation without flow-limiting stenosis. Right ICA patent distally to the skullbase without stenosis, dissection, or occlusion. Left carotid system: Left common  carotid artery patent from its origin to the bifurcation. Scattered calcified plaque about the left bifurcation without flow-limiting stenosis. Left ICA patent distally to the skullbase without stenosis, dissection, or occlusion. Vertebral arteries: Both of the vertebral arteries arise from the subclavian arteries. The the vertebral arteries widely patent without stenosis, dissection, or occlusion. Focal plaque noted at the origin of the right vertebral artery without high-grade stenosis. Skeleton: No acute osseous abnormality. No worrisome lytic or blastic osseous lesions. Other neck: Visualized soft tissues of the neck demonstrate no acute abnormality. Upper chest: Visualized upper mediastinum within normal limits. Layering right pleural effusion. Probable smaller left effusion. Scattered interlobular septal thickening within the partially visualized lungs, suggesting  edema. Review of the MIP images confirms the above findings CTA HEAD FINDINGS Anterior circulation: Petrous segments patent bilaterally. Scattered calcified atheromatous plaque within the cavernous/ supraclinoid ICAs with mild to moderate multifocal narrowing. ICA termini patent. Left A1 segment dominant and widely patent. Right A1 segment hypoplastic but patent as well. Anterior communicating artery normal. Anterior cerebral arteries patent to their distal aspects. Right M1 segment patent without stenosis or occlusion. Right MCA bifurcation normal. Right MCA branches well opacified to their distal aspects. Left M1 segment patent without stenosis or occlusion. Left MCA bifurcation normal. No proximal M2 branch occlusion. There is abrupt occlusion of a left M3 branch, middle division (series 602, image 80). This corresponds with previously seen hyperdensity on noncontrast portion of this exam. No other arterial branch occlusion. Left MCA branches otherwise patent. Posterior circulation: Scattered plaque within the V4 segments bilaterally, right  greater than left with mild multifocal narrowing. Posterior inferior cerebral arteries patent bilaterally. Basilar artery widely patent. Superior cerebellar arteries patent bilaterally. Both of the posterior cerebral arteries supplied mainly via the basilar artery and are patent to their distal aspects. Venous sinuses: Patent. Anatomic variants: No significant anatomic variant. No aneurysm or vascular malformation. Delayed phase: Not performed. Review of the MIP images confirms the above findings IMPRESSION: 1. 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 2. Scattered atheromatous plaque throughout the carotid siphons with mild to moderate multifocal narrowing. 3. Atheromatous plaque about the carotid bifurcations bilaterally without flow-limiting stenosis. 4. Large layering right pleural effusion, with smaller left pleural effusion. Interlobular septal thickening suggestive of pulmonary edema. Critical Value/emergent results were called by telephone at the time of interpretation on 02/05/2016 at 6:19 pm to Dr. Ritta Slot , who verbally acknowledged these results. Electronically Signed   By: Rise Mu M.D.   On: 02/05/2016 19:04   Mr Brain Wo Contrast  Result Date: 02/06/2016 CLINICAL DATA:  Acute on chronic RIGHT-sided weakness, aphasia. Follow-up M3 occlusion. Assess stroke versus PRES. History of hypertension and diabetes. EXAM: MRI HEAD WITHOUT CONTRAST TECHNIQUE: Multiplanar, multiecho pulse sequences of the brain and surrounding structures were obtained without intravenous contrast. COMPARISON:  CT HEAD February 05, 2016 and MRI of the head May 20, 2015 FINDINGS: BRAIN: Confluent reduced diffusion measuring up to 5.8 cm in LEFT frontal lobe, involving the insula and operculum with low ADC values. Non contiguous patchy reduced diffusion LEFT anterior frontal lobe. No susceptibility artifact to suggest acute hemorrhage. The  ventricles and sulci are normal for patient's age. Small area RIGHT occipital lobe encephalomalacia. Old LEFT pontomedullary infarcts. Old bilateral basal ganglia lacunar infarcts. Symmetric basal ganglia susceptibility artifact most consistent with old hemorrhage. Patchy supratentorial and pontine white matter FLAIR T2 hyperintensities. Mild ventriculomegaly on the basis of global parenchymal brain volume loss. No intraparenchymal mass effect, masses. No abnormal extra-axial fluid collections. VASCULAR: Normal major intracranial vascular flow voids present at skull base. SKULL AND UPPER CERVICAL SPINE: Empty sella. No suspicious calvarial bone marrow signal. Craniocervical junction maintained. SINUSES/ORBITS: Mild paranasal sinus mucosal thickening. The included ocular globes and orbital contents are non-suspicious. Status post LEFT scleral banding and ocular lens implant. OTHER: None. IMPRESSION: Acute small to moderate LEFT frontal lobe/ MCA territory infarct corresponding to perfusion abnormality. 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 Old basal ganglia and pontomedullary lacunar infarcts. Old RIGHT occipital lobe/ PCA territory infarct. Mild to moderate chronic small vessel ischemic disease. Mild atrophy. Electronically Signed   By: Awilda Metro  M.D.   On: 02/06/2016 06:01   Ct Cerebral Perfusion W Contrast  Result Date: 02/05/2016 CLINICAL DATA:  Initial evaluation for acute right-sided weakness. EXAM: CT PERFUSION BRAIN TECHNIQUE: Multiphase CT imaging of the brain was performed following IV bolus contrast injection. Subsequent parametric perfusion maps were calculated using RAPID software. CONTRAST:  90 cc of Isovue 370. FINDINGS: CT Brain Perfusion Findings: CBF (<30%) Volume: 21 ccmL Perfusion (Tmax>6.0s) volume: 92 ccmL Mismatch Volume: 71 ccmL Infarction Location:Core infarct primarily involves the left frontal operculum/anterior left frontal lobe, with some extension towards the left  insula and left temporal lobe. Please note that while the mismatch volume is measured as 71 cc of this exam, there is felt to be some artifact on the T max perfusion images, with elevated T-max seen not only around the core infarct, but also within the right cerebral hemisphere as well as the right cerebellar hemisphere. Overall, while there is likely some penumbra about the area of core infarction, the recorded 71 cc is likely not accurate as to the volume of actual mismatch (which is likely significantly less, although difficult to accurately quantify on this exam). IMPRESSION: Acute small to moderate-sized left MCA territory infarct as above. Critical Value/emergent results were called by telephone at the time of interpretation on 02/05/2016 at 6:19 pm to Dr. Ritta Slot , who verbally acknowledged these results. Electronically Signed   By: Rise Mu M.D.   On: 02/05/2016 18:27   Dg Chest Port 1 View  Result Date: 02/05/2016 CLINICAL DATA:  Acute altered mental status. EXAM: PORTABLE CHEST 1 VIEW COMPARISON:  07/26/2015 FINDINGS: The patient has developed cardiomegaly with pulmonary vascular congestion and slight bilateral interstitial pulmonary edema. No acute bone abnormality. IMPRESSION: New cardiomegaly with pulmonary vascular congestion and slight bilateral interstitial pulmonary edema. Electronically Signed   By: Francene Boyers M.D.   On: 02/05/2016 16:01   Dg Swallowing Func-speech Pathology  Result Date: 02/06/2016 Objective Swallowing Evaluation: Type of Study: MBS-Modified Barium Swallow Study Patient Details Name: Jacinto Keil MRN: [id number] Date of Birth: 12/10/1960 Today's Date: 02/06/2016 Time: SLP Start Time (ACUTE ONLY): 1017-SLP Stop Time (ACUTE ONLY): 1033 SLP Time Calculation (min) (ACUTE ONLY): 16 min Past Medical History: Past Medical History: Diagnosis Date . BPH (benign prostatic hyperplasia)  . CHF (congestive heart failure) (HCC)  . Diabetes mellitus without  complication (HCC)  . Diabetic retinopathy associated with type 2 diabetes mellitus (HCC)  . Hypertension  . Lymphedema  . OSA (obstructive sleep apnea)  . TIA (transient ischemic attack)  Past Surgical History: Past Surgical History: Procedure Laterality Date . BLADDER SURGERY   . CARDIAC CATHETERIZATION   HPI: Maximiliano Cromartie a 56 y.o.malewith medical history significant for chronic CHF, insulin-dependent diabetes mellitus, hypertension, depression, OSA, ulcerative colitis, prior CVA with minimal residual right-sided weakness who presents to the emergency department with dysarthria, confusion, lethargy, and right facial droop. Patient lives alone and family resides in [facility], [place] and [place]. Chest x-ray is notable for new cardiomegaly with pulmonary vascular congestion and slight bilateral interstitial edema. MRI acute small to moderate LEFT frontal lobe/ MCA territory infarct corresponding to perfusion abnormality. 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 Old basal ganglia. MBS recommended following BSE. No Data Recorded Assessment / Plan / Recommendation CHL IP CLINICAL IMPRESSIONS 02/06/2016 Therapy Diagnosis Moderate oral phase dysphagia;Mild pharyngeal phase dysphagia Clinical Impression Labial spillage, delayed transit and intermittent piecemeal pattern describe pt's oral function. Swallow initiation delayed to pyriform sinuses due to decreased sensation without penetration or aspiration with  consecutive cup and straw sips and minimal vallecular residue. Upon clinical observation during bedside swallow and following MBS, pt has multiple swallows and intermittent mild throat clears with the appearance of difficulty however pt safe with consecutive larger sips. Risk for pocketing present therefore recommend Dys 2 texture and thin liquids, crush meds, straws allowed, sit upright and check right side oral cavity for pocketing. ST will continue to follow.     Impact on safety and function Mild  aspiration risk   CHL IP TREATMENT RECOMMENDATION 02/06/2016 Treatment Recommendations Therapy as outlined in treatment plan below   Prognosis 02/06/2016 Prognosis for Safe Diet Advancement Good Barriers to Reach Goals -- Barriers/Prognosis Comment -- CHL IP DIET RECOMMENDATION 02/06/2016 SLP Diet Recommendations Dysphagia 2 (Fine chop) solids;Thin liquid Liquid Administration via Straw;Cup Medication Administration Crushed with puree Compensations Slow rate;Small sips/bites;Minimize environmental distractions;Lingual sweep for clearance of pocketing Postural Changes Seated upright at 90 degrees   CHL IP OTHER RECOMMENDATIONS 02/06/2016 Recommended Consults -- Oral Care Recommendations Oral care BID Other Recommendations --   CHL IP FOLLOW UP RECOMMENDATIONS 02/06/2016 Follow up Recommendations Inpatient Rehab   CHL IP FREQUENCY AND DURATION 02/06/2016 Speech Therapy Frequency (ACUTE ONLY) min 2x/week Treatment Duration 2 weeks      CHL IP ORAL PHASE 02/06/2016 Oral Phase Impaired Oral - Pudding Teaspoon -- Oral - Pudding Cup -- Oral - Honey Teaspoon -- Oral - Honey Cup -- Oral - Nectar Teaspoon -- Oral - Nectar Cup -- Oral - Nectar Straw -- Oral - Thin Teaspoon -- Oral - Thin Cup Delayed oral transit;Piecemeal swallowing Oral - Thin Straw Delayed oral transit;Piecemeal swallowing Oral - Puree -- Oral - Mech Soft -- Oral - Regular Delayed oral transit;Weak lingual manipulation Oral - Multi-Consistency -- Oral - Pill -- Oral Phase - Comment --  CHL IP PHARYNGEAL PHASE 02/06/2016 Pharyngeal Phase Impaired Pharyngeal- Pudding Teaspoon -- Pharyngeal -- Pharyngeal- Pudding Cup -- Pharyngeal -- Pharyngeal- Honey Teaspoon -- Pharyngeal -- Pharyngeal- Honey Cup -- Pharyngeal -- Pharyngeal- Nectar Teaspoon -- Pharyngeal -- Pharyngeal- Nectar Cup Delayed swallow initiation-pyriform sinuses Pharyngeal -- Pharyngeal- Nectar Straw -- Pharyngeal -- Pharyngeal- Thin Teaspoon -- Pharyngeal -- Pharyngeal- Thin Cup Delayed swallow  initiation-pyriform sinuses;Pharyngeal residue - valleculae Pharyngeal -- Pharyngeal- Thin Straw Delayed swallow initiation-pyriform sinuses;Pharyngeal residue - valleculae Pharyngeal -- Pharyngeal- Puree -- Pharyngeal -- Pharyngeal- Mechanical Soft -- Pharyngeal -- Pharyngeal- Regular WFL Pharyngeal -- Pharyngeal- Multi-consistency -- Pharyngeal -- Pharyngeal- Pill -- Pharyngeal -- Pharyngeal Comment --  CHL IP CERVICAL ESOPHAGEAL PHASE 02/06/2016 Cervical Esophageal Phase WFL Pudding Teaspoon -- Pudding Cup -- Honey Teaspoon -- Honey Cup -- Nectar Teaspoon -- Nectar Cup -- Nectar Straw -- Thin Teaspoon -- Thin Cup -- Thin Straw -- Puree -- Mechanical Soft -- Regular -- Multi-consistency -- Pill -- Cervical Esophageal Comment -- No flowsheet data found. Royce Macadamia 02/06/2016, 11:14 AM Breck Coons Lonell Face.Ed CCC-SLP Pager [phone]                 Lab Results  Component Value Date   HGBA1C 6.4 (H) 02/05/2016   Lab Results  Component Value Date   LDLCALC 94 02/06/2016   CREATININE 1.58 (H) 02/07/2016       Scheduled Meds: . aspirin EC  325 mg Oral Daily   Or  . aspirin  300 mg Rectal Daily  . atorvastatin  20 mg Oral QHS  . calcium-vitamin D  1 tablet Oral Daily  . citalopram  40 mg Oral Daily  . clopidogrel  75 mg  Oral Daily  . doxazosin  4 mg Oral QHS  . enoxaparin (LOVENOX) injection  40 mg Subcutaneous Q24H  . insulin aspart  0-9 Units Subcutaneous Q4H  . insulin glargine  16 Units Subcutaneous QHS  . isosorbide mononitrate  30 mg Oral Daily  . multivitamin with minerals  1 tablet Oral Daily  . sodium chloride flush  3 mL Intravenous Q12H  . sulfaSALAzine  2,000 mg Oral BID  . tamsulosin  0.4 mg Oral QHS   Continuous Infusions:   LOS: 2 days    Time spent: >30 MINS    [facility]  [facility] Pager [phone]. If 7PM-7AM, please contact night-coverage at [email or web address], password [facility] 02/07/2016, 8:51 AM  LOS: 2 days

## 2016-02-08 ENCOUNTER — Encounter (HOSPITAL_COMMUNITY)
Admission: EM | Disposition: A | Discharge status: Rehabilitation Facility | Payer: Self-pay | Source: Home / Self Care | Attending: Internal Medicine

## 2016-02-08 ENCOUNTER — Inpatient Hospital Stay (HOSPITAL_COMMUNITY): Payer: Medicare Other

## 2016-02-08 ENCOUNTER — Encounter (HOSPITAL_COMMUNITY): Payer: Self-pay | Admitting: Nurse Practitioner

## 2016-02-08 DIAGNOSIS — I420 Dilated cardiomyopathy: Secondary | ICD-10-CM

## 2016-02-08 DIAGNOSIS — I639 Cerebral infarction, unspecified: Secondary | ICD-10-CM

## 2016-02-08 HISTORY — PX: TEE WITHOUT CARDIOVERSION: SHX5443

## 2016-02-08 HISTORY — PX: EP IMPLANTABLE DEVICE: SHX172B

## 2016-02-08 LAB — GLUCOSE, CAPILLARY
GLUCOSE-CAPILLARY: 66 mg/dL (ref 65–99)
GLUCOSE-CAPILLARY: 81 mg/dL (ref 65–99)
GLUCOSE-CAPILLARY: 89 mg/dL (ref 65–99)
GLUCOSE-CAPILLARY: 95 mg/dL (ref 65–99)
Glucose-Capillary: 94 mg/dL (ref 65–99)
Glucose-Capillary: 92 mg/dL (ref 65–99)
Glucose-Capillary: 101 mg/dL — ABNORMAL HIGH (ref 65–99)
Glucose-Capillary: 104 mg/dL — ABNORMAL HIGH (ref 65–99)

## 2016-02-08 LAB — CBC
HEMATOCRIT: 34.8 % — AB (ref 39.0–52.0)
Hemoglobin: 11.4 g/dL — ABNORMAL LOW (ref 13.0–17.0)
MCH: 28.5 pg (ref 26.0–34.0)
MCHC: 32.8 g/dL (ref 30.0–36.0)
MCV: 87 fL (ref 78.0–100.0)
PLATELETS: 219 10*3/uL (ref 150–400)
RBC: 4 MIL/uL — ABNORMAL LOW (ref 4.22–5.81)
RDW: 15.7 % — AB (ref 11.5–15.5)
WBC: 6.2 10*3/uL (ref 4.0–10.5)

## 2016-02-08 LAB — COMPREHENSIVE METABOLIC PANEL
ALT: 18 U/L (ref 17–63)
ANION GAP: 8 (ref 5–15)
AST: 27 U/L (ref 15–41)
Albumin: 2.3 g/dL — ABNORMAL LOW (ref 3.5–5.0)
Alkaline Phosphatase: 181 U/L — ABNORMAL HIGH (ref 38–126)
BILIRUBIN TOTAL: 0.6 mg/dL (ref 0.3–1.2)
BUN: 17 mg/dL (ref 6–20)
CHLORIDE: 107 mmol/L (ref 101–111)
CO2: 28 mmol/L (ref 22–32)
Calcium: 8.7 mg/dL — ABNORMAL LOW (ref 8.9–10.3)
Creatinine, Ser: 1.38 mg/dL — ABNORMAL HIGH (ref 0.61–1.24)
GFR calc Af Amer: >60 mL/min (ref >60)
GFR, EST NON AFRICAN AMERICAN: 57 mL/min — AB (ref >60)
Glucose, Bld: 86 mg/dL (ref 65–99)
POTASSIUM: 3.5 mmol/L (ref 3.5–5.1)
Sodium: 143 mmol/L (ref 135–145)
TOTAL PROTEIN: 5.1 g/dL — AB (ref 6.5–8.1)

## 2016-02-08 SURGERY — ECHOCARDIOGRAM, TRANSESOPHAGEAL
Anesthesia: Moderate Sedation

## 2016-02-08 SURGERY — LOOP RECORDER INSERTION
Anesthesia: LOCAL

## 2016-02-08 MED ORDER — MIDAZOLAM HCL 5 MG/ML IJ SOLN
INTRAMUSCULAR | Status: AC
  Filled 2016-02-08: qty 2

## 2016-02-08 MED ORDER — LIDOCAINE-EPINEPHRINE 1 %-1:100000 IJ SOLN
INTRAMUSCULAR | Status: AC
  Filled 2016-02-08: qty 1

## 2016-02-08 MED ORDER — INSULIN GLARGINE 100 UNIT/ML ~~LOC~~ SOLN
5.0000 [IU] | Freq: Every day | SUBCUTANEOUS | Status: DC
  Administered 2016-02-08 – 2016-02-10 (×3): 5 [IU] via SUBCUTANEOUS
  Filled 2016-02-08 (×3): qty 0.05

## 2016-02-08 MED ORDER — BUTAMBEN-TETRACAINE-BENZOCAINE 2-2-14 % EX AERO
INHALATION_SPRAY | CUTANEOUS | Status: DC | PRN
  Administered 2016-02-08: 2 via TOPICAL

## 2016-02-08 MED ORDER — LIDOCAINE-EPINEPHRINE 1 %-1:100000 IJ SOLN
INTRAMUSCULAR | Status: DC | PRN
  Administered 2016-02-08: 10 mL

## 2016-02-08 MED ORDER — LISINOPRIL 2.5 MG PO TABS: 2.5000 mg | ORAL_TABLET | Freq: Every day | ORAL | Status: DC

## 2016-02-08 MED ORDER — FENTANYL CITRATE (PF) 100 MCG/2ML IJ SOLN
INTRAMUSCULAR | Status: DC | PRN
  Administered 2016-02-08: 25 ug via INTRAVENOUS

## 2016-02-08 MED ORDER — SODIUM CHLORIDE 0.9 % IV SOLN
INTRAVENOUS | Status: DC
  Administered 2016-02-08: 500 mL via INTRAVENOUS

## 2016-02-08 MED ORDER — LABETALOL HCL 5 MG/ML IV SOLN
INTRAVENOUS | Status: DC | PRN
  Administered 2016-02-08: 10 mg via INTRAVENOUS

## 2016-02-08 MED ORDER — LABETALOL HCL 5 MG/ML IV SOLN
INTRAVENOUS | Status: AC
  Filled 2016-02-08: qty 4

## 2016-02-08 MED ORDER — FENTANYL CITRATE (PF) 100 MCG/2ML IJ SOLN
INTRAMUSCULAR | Status: AC
  Filled 2016-02-08: qty 2

## 2016-02-08 MED ORDER — MIDAZOLAM HCL 10 MG/2ML IJ SOLN
INTRAMUSCULAR | Status: DC | PRN
  Administered 2016-02-08 (×2): 2 mg via INTRAVENOUS

## 2016-02-08 SURGICAL SUPPLY — 2 items
LOOP REVEAL LINQSYS (Prosthesis & Implant Heart) ×2 IMPLANT
PACK LOOP INSERTION (CUSTOM PROCEDURE TRAY) ×2 IMPLANT

## 2016-02-08 NOTE — Progress Notes (Addendum)
I met with pt, his brother and sister in law at bedside. All are in agreement to admit to CIR pending medical workup completion. Noted for TEE and LOOP today. I spoke with Dr. Allyson Sabal and she states pt will not be medically ready today due to need for diuresis over the weekend. I will follow up on Monday. [phone]

## 2016-02-08 NOTE — Progress Notes (Addendum)
Occupational Therapy Treatment Patient Details Name: [name] MRN: [id number] DOB: [birth year] Today's Date: 02/08/2016    History of present illness 56 y.o. male admitted to [facility] on 02/05/16 for right facial droop and right sided weakness found to have a L MCA CVA.  Pt with significant PMHx of TIA, HTN, DM with diabetic retinopathy, and CHF.   OT comments  Pt progressing toward OT goals. Pt able to demonstrate slight active extension with R hand this date but not yet functional as of yet. Pt able to don/doff socks with max assist this session and assist to wash his feet. Improved ability to attend to R visual field and RUE intermittently this session but this remains impaired. Pt continues to require verbal and tactile cues for positioning of R UE during functional mobility, bed mobility, and ADL transfers. Pt remains a good candidate for CIR placement for continued rehabilitation services. OT will continue to follow acutely.   Follow Up Recommendations  CIR    Equipment Recommendations  3 in 1 bedside commode    Recommendations for Other Services Rehab consult    Precautions / Restrictions Precautions Precautions: Fall Precaution Comments: watch BP, right sided weakness Restrictions Weight Bearing Restrictions: No       Mobility Bed Mobility Overal bed mobility: Needs Assistance Bed Mobility: Supine to Sit Rolling: Mod assist;Min assist   Supine to sit: Supervision     General bed mobility comments: Supervision for safety, pt coming out of right side of bed and when given extra time, and use of railing he was able to work his way up to sitting.   Transfers Overall transfer level: Needs assistance Equipment used: Rolling walker (2 wheeled) Transfers: Sit to/from Stand Sit to Stand: +2 physical assistance;Mod assist         General transfer comment: Cues for hand placement assist to boost and facilitate use of RUE to push and reach.      Balance Overall balance  assessment: Needs assistance Sitting-balance support: Feet supported;Bilateral upper extremity supported Sitting balance-Leahy Scale: Fair Sitting balance - Comments: Pt needed assist in positioning his right hand flat on the bed as he had it curled under and was WB through his wrist    Standing balance support: Bilateral upper extremity supported Standing balance-Leahy Scale: Poor Standing balance comment: needs external assist in standing.                    ADL Overall ADL's : Needs assistance/impaired         Upper Body Bathing: Moderate assistance;Sitting   Lower Body Bathing: Maximal assistance;Sitting/lateral leans Lower Body Bathing Details (indicate cue type and reason): Max assist for donning/doffing socks. Able to  Upper Body Dressing : Moderate assistance Upper Body Dressing Details (indicate cue type and reason): cues to dress R UE Lower Body Dressing: Maximal assistance;Sitting/lateral leans   Toilet Transfer: Moderate assistance;+2 for physical assistance;+2 for safety/equipment;Ambulation;RW Toilet Transfer Details (indicate cue type and reason): Pt required VC's and tactile cues to don          Functional mobility during ADLs: Moderate assistance;+2 for physical assistance;+2 for safety/equipment;Rolling walker General ADL Comments: Pt demonstrated Brunstrom level III approaching IV and able to demonstrate slight active extension but this is not functional.Upon standing, pt began to urinate for lengthy period of time. He was able to don/doff socks with max assist while sitting. With VC's he was able to lift RUE for UB dressing tasks.      Vision  Additional Comments: Pt with improved ability to attend to R visual field this session. Able to easily locate photo of family member placed to the right as well as read signs on the R side with cues. Did tend to run into items on the R and need cues to leave room for therapist around  obstacles.   Perception     Praxis      Cognition   Behavior During Therapy: WFL for tasks assessed/performed Overall Cognitive Status: Impaired/Different from baseline Area of Impairment: Attention;Following commands;Safety/judgement;Awareness;Problem solving   Current Attention Level: Sustained    Following Commands: Follows one step commands consistently;Follows multi-step commands inconsistently;Follows multi-step commands with increased time Safety/Judgement: Decreased awareness of safety;Decreased awareness of deficits Awareness: Intellectual Problem Solving: Difficulty sequencing;Requires verbal cues;Requires tactile cues General Comments: Intermittent attention to RUE today.      Extremity/Trunk Assessment               Exercises     Shoulder Instructions       General Comments      Pertinent Vitals/ Pain       Pain Assessment: No/denies pain  Home Living                                          Prior Functioning/Environment              Frequency  Min 3X/week        Progress Toward Goals  OT Goals(current goals can now be found in the care plan section)  Progress towards OT goals: Progressing toward goals  Acute Rehab OT Goals Patient Stated Goal: Wants to walk OT Goal Formulation: With patient Time For Goal Achievement: 02/20/16 Potential to Achieve Goals: Good ADL Goals Pt Will Perform Grooming: with set-up;sitting Pt Will Transfer to Toilet: with mod assist;bedside commode;stand pivot transfer Pt/caregiver will Perform Home Exercise Program: Increased ROM;Increased strength;Right Upper extremity;With minimal assist;With written HEP provided Additional ADL Goal #1: pt will complete bed mobility supervision level as precursor to adls.   Plan Discharge plan remains appropriate    Co-evaluation    PT/OT/SLP Co-Evaluation/Treatment: Yes Reason for Co-Treatment: Complexity of the patient's impairments (multi-system  involvement);For patient/therapist safety PT goals addressed during session: Mobility/safety with mobility;Balance OT goals addressed during session: ADL's and self-care;Proper use of Adaptive equipment and DME      End of Session Equipment Utilized During Treatment: Gait belt;Rolling walker   Activity Tolerance Patient tolerated treatment well   Patient Left in chair;with call bell/phone within reach;with chair alarm set   Nurse Communication Other (comment) (Condom catheter came off.)        Time: 1208-1248 OT Time Calculation (min): 40 min  Charges: OT General Charges $OT Visit: 1 Procedure OT Treatments $Self Care/Home Management : 23-37 mins  Doristine Section, OTR/L [phone] 02/08/2016, 1:22 PM

## 2016-02-08 NOTE — Progress Notes (Signed)
Pt. On CPAP for h/s with 2 lpm Oxygen bled in 98%, tolerating well.

## 2016-02-08 NOTE — Progress Notes (Signed)
STROKE TEAM PROGRESS NOTE   SUBJECTIVE (INTERVAL HISTORY) Pt more awake alert and pedal edema improved after diuresis. Pending TEE and loop today.    OBJECTIVE Temp:  [98.1 F (36.7 C)-98.7 F (37.1 C)] 98.4 F (36.9 C) (01/05 1327) Pulse Rate:  [62-91] 79 (01/05 1450) Cardiac Rhythm: Normal sinus rhythm (01/05 0700) Resp:  [11-20] 14 (01/05 1450) BP: (140-211)/(82-115) 176/100 (01/05 1450) SpO2:  [94 %-100 %] 100 % (01/05 1450) Weight:  [103 kg (227 lb)] 103 kg (227 lb) (01/05 0319)  CBC:   Recent Labs Lab 02/05/16 1532 02/08/16 0710  WBC 6.3 6.2  NEUTROABS 5.0  --   HGB 12.1* 11.4*  HCT 37.0* 34.8*  MCV 87.7 87.0  PLT 230 219    Basic Metabolic Panel:   Recent Labs Lab 02/06/16 0429 02/07/16 0730 02/08/16 0710  NA 145 143 143  K 3.8 3.4* 3.5  CL 110 110 107  CO2 24 28 28   GLUCOSE 112* 70 86  BUN 23* 21* 17  CREATININE 1.45* 1.58* 1.38*  CALCIUM 8.9 8.7* 8.7*  MG 1.9  --   --     Lipid Panel:     Component Value Date/Time   CHOL 157 02/06/2016 0429   TRIG 80 02/06/2016 0429   HDL 47 02/06/2016 0429   CHOLHDL 3.3 02/06/2016 0429   VLDL 16 02/06/2016 0429   LDLCALC 94 02/06/2016 0429   HgbA1c:  Lab Results  Component Value Date   HGBA1C 6.4 (H) 02/05/2016   Urine Drug Screen:     Component Value Date/Time   LABOPIA NONE DETECTED 02/05/2016 1624   COCAINSCRNUR NONE DETECTED 02/05/2016 1624   LABBENZ NONE DETECTED 02/05/2016 1624   AMPHETMU NONE DETECTED 02/05/2016 1624   THCU NONE DETECTED 02/05/2016 1624   LABBARB NONE DETECTED 02/05/2016 1624      IMAGING I have personally reviewed the radiological images below and agree with the radiology interpretations.  Ct Head Wo Contrast 02/05/2016 Old left basal ganglia and right occipital infarct. No acute intracranial abnormality.   Ct Angio Head W Or Wo Contrast Ct Angio Neck W Or Wo Contrast 02/05/2016 1. Acute left M3 occlusion with evolving left MCA territory infarct, best seen on  perfusion portion of this exam. 2. Scattered atheromatous plaque throughout the carotid siphons with mild to moderate multifocal narrowing. 3. Atheromatous plaque about the carotid bifurcations bilaterally without flow-limiting stenosis. 4. Large layering right pleural effusion, with smaller left pleural effusion. Interlobular septal thickening suggestive of pulmonary edema.   Ct Cerebral Perfusion W Contrast 02/05/2016 Acute small to moderate-sized left MCA territory infarct   Mr Brain Wo Contrast 02/06/2016 Acute small to moderate LEFT frontal lobe/ MCA territory infarct corresponding to perfusion abnormality. 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 Old basal ganglia and pontomedullary lacunar infarcts. Old RIGHT occipital lobe/ PCA territory infarct. Mild to moderate chronic small vessel ischemic disease. Mild atrophy.   Dg Chest Port 1 View 02/05/2016 New cardiomegaly with pulmonary vascular congestion and slight bilateral interstitial pulmonary edema.   LE venous Doppler - negative for DVT  TTE  - Left ventricle: The cavity size was normal. Wall thickness was increased in a pattern of severe LVH. LV mass index 356 g/m2. RWT of 0.73, suggesting severely abnormal concentric hypertrophy. Systolic function was moderately to severely reduced. The estimated ejection fraction was 35%. Diffuse hypokinesis. Doppler parameters are consistent with pseduonormal left ventricular relaxation (grade 2 diastolic dysfunction). The E/e&' ratio is >15, suggesting elevated LV filling pressure. Isovolumic relaxation time: 126  ms. - Aortic valve: Mildly thickened and sclerotic leaflet tips. No stenosis. There was trivial regurgitation. - Mitral valve: Mildly thickened leaflets . There was trivial regurgitation. - Left atrium: Severely dilated. - Right ventricle: The cavity size was normal. Systolic function is moderately reduced. - Right atrium: Moderately dilated. - Inferior vena cava: The vessel was  dilated. The respirophasic diameter changes were blunted (< 50%), consistent with elevated central venous pressure. Impressions: - LVEF 35%, severe concentric LVH (IVSd of 2.3 cm), global hypokinesis, grade 2 DD with high LV filling pressure, sclerotic aortic leaflet tips without stenosis, trivial AI and MR, severe LAE and moderate RAE, moderate RV systolic dysfunction, dilated IVC, no pericardial effusion.  TEE 02/08/2016 Left Ventrical:Moderate - severe LV dysfunction - EF 30% Mitral Valve:trace MR Aortic Valve:trace AI Tricuspid Valve: mild TR  Pulmonic Valve: Trace PI Left Atrium/ Left atrial appendage:no thrombi Atrial septum:no ASD or PFO by color flow or bubble contrast.  Aorta:very minimal aortic atheroma   Loop Recorder Placed by Dr. Graciela Husbands on 02/08/2016   PHYSICAL EXAM  Temp:  [98.1 F (36.7 C)-98.7 F (37.1 C)] 98.4 F (36.9 C) (01/05 1327) Pulse Rate:  [62-91] 79 (01/05 1450) Resp:  [11-20] 14 (01/05 1450) BP: (140-211)/(82-115) 176/100 (01/05 1450) SpO2:  [94 %-100 %] 100 % (01/05 1450) Weight:  [103 kg (227 lb)] 103 kg (227 lb) (01/05 0319)  General - Well nourished, well developed, sleepy and drowsy, but arousable.  Ophthalmologic - Fundi not visualized due to noncooperation.  Cardiovascular - Regular rate and rhythm.  Neuro - Drowsy and sleepy, but arousable, following simple commands. Expressive aphasia, not able to name or repeat. Limited bilateral gaze, left gaze preference, able to cross midline, questionable for right neglect. PERRL, blinking to visual threat inconsistent bilaterally. Right facial droop. Tongue at right side in mouth. LUE 5/5, RUE 3/5, BLE 3/5 with limited DF and PF bilaterally. DTR 1+ and no babinski. Sensation, coordination and gait not tested.   ASSESSMENT/PLAN Mr. Navi Erber is a 56 y.o. male with history of previous stroke with mild right-sided hemiparesis  presenting with R sided weakness. He did not receive IV t-PA due to  being outside the window.   Stroke:  Dominant left frontal/MCA territory infarct embolic pattern secondary to unknown source. Patient does have multiple stroke risk factors, however, cardiac embolic source are most likely.  Resultant  expressive aphasia, right hemiparesis  CT old L BG and R occipital infarcts  CTA head and neck L M3 occlusion, R pleural effusion, pulmonary edema.  CTP small to moderate size L MCA territory infarct  MRI  Small to moderate L frontal/MCA territory infarct. Old B BG, pontomedullary, R occipital/PCA infarcts.  2D Echo  EF 35%. Down from 55% prior. Cardiology consult requested  LE doppler no DVT  TEE negative for embolic source.   loop recorder placed 02/08/2016  LDL 94  HgbA1c 6.4  Lovenox 40 mg sq daily for VTE prophylaxis Diet NPO time specified  No antithrombotic prior to admission, Due to intracranial stenosis, recommend DAPT with aspirin and Plavix for 3 months and then Plavix alone.  Patient counseled to be compliant with his antithrombotic medications  Ongoing aggressive stroke risk factor management  Therapy recommendations:  CIR  Disposition:  pending  Acute on chronic CHF  EF down from 55% to 35%  TEE showed EF 30%  Fluid overload  CTA showed pleural effusion  Cardiology on board  On lasix  Hx of stroke -   03/2014 pontine infarct  started on Aggrenox   A1c 13.7 and LDL 97   residual right-sided weakness and slurred speech  Hypertension Hypertensive Urgency  BP as high as 212/124 on arrival in setting of neurologic symptoms  Remains elevated  Permissive hypertension (OK if < 220/120) but gradually normalize in 5-7 days Receiving prn labetolol Long-term BP goal normotensive  Hyperlipidemia  Home meds:  lipitor 40, resumed in hospital  LDL 94, goal < 70  Continue statin at discharge  Diabetes type II  HgbA1c 6.4, goal < 7.0  On Lantus  SSI  CBG monitoring  DM RN following  Other Stroke Risk  Factors  Obesity, Body mass index is 31.66 kg/m., recommend weight loss, diet and exercise as appropriate   Obstructive sleep apnea  Other Active Problems  Hypokalemia  Hospital day # 3  Neurology will sign off. Please call with questions. Pt will follow up with Dr. Roda Shutters at [facility] in about 6 weeks. Thanks for the consult.  Marvel Plan, MD PhD Stroke Neurology 02/08/2016 9:57 PM   To contact Stroke Continuity provider, please refer to [email or web address]. After hours, contact General Neurology

## 2016-02-08 NOTE — Progress Notes (Signed)
   02/08/16 1708  Modified Rankin (Stroke Patients Only)  Pre-Morbid Rankin Score 3  Modified Rankin 4  Joycelyn Rua, [place] pager [phone]

## 2016-02-08 NOTE — Interval H&P Note (Signed)
History and Physical Interval Note:  02/08/2016 2:15 PM  [name]  has presented today for surgery, with the diagnosis of STROKE  The various methods of treatment have been discussed with the patient and family. After consideration of risks, benefits and other options for treatment, the patient has consented to  Procedure(s): TRANSESOPHAGEAL ECHOCARDIOGRAM (TEE) (N/A) as a surgical intervention .  The patient's history has been reviewed, patient examined, no change in status, stable for surgery.  I have reviewed the patient's chart and labs.  Questions were answered to the patient's satisfaction.     Kristeen Miss

## 2016-02-08 NOTE — Progress Notes (Signed)
SLP Cancellation Note  Patient Details Name: Timmy Dwinell MRN: [id number] DOB: 05-15-1960   Cancelled treatment:        NPO for TEE. Will follow along.   Royce Macadamia 02/08/2016, 10:39 AM   Breck Coons Lonell Face.Ed [employer] [phone]

## 2016-02-08 NOTE — Consult Note (Signed)
ELECTROPHYSIOLOGY CONSULT NOTE  Patient ID: [name] MRN: [id number], DOB/AGE: 1961/01/29   Admit date: 02/05/2016 Date of Consult: 02/08/2016  Primary Physician: [facility] Primary Cardiologist: new to [facility], Dr. Anne Fu Reason for Consultation: Cryptogenic stroke -  recommendations regarding Implantable Loop Recorder  History of Present Illness [name] was admitted on 02/05/2016 with CVA.  PMHx of chronic diastolic CHF (EF 78-29% in 04/2015), DMT2, HTN, ulcerative colitis, OSA on CPAP, prior CVA and depression.  They first developed symptoms while at home on the phone of slurred speech and dropped the phone, and facial droop.  He was also noted to have a clear decrease in his EF, markedly edematous with CHF and cardiology was called in and following, no ischemic w/u is planned at least at this time.   He remains with R sided deficit and expressive aphasia.  Imaging demonstrated Dominant left frontal/MCA territory infarct embolic pattern secondary to unknown source.  he has undergone workup for stroke including echocardiogram and carotid angio, LE dopplers are negative for DVT b/l.  The patient has been monitored on telemetry which has demonstrated sinus rhythm with no arrhythmias.  Inpatient stroke work-up is to be completed with a TEE.   Echocardiogram this admission demonstrated   Study Conclusions - Left ventricle: The cavity size was normal. Wall thickness was   increased in a pattern of severe LVH. LV mass index 356 g/m2. RWT   of 0.73, suggesting severely abnormal concentric hypertrophy.   Systolic function was moderately to severely reduced. The   estimated ejection fraction was 35%. Diffuse hypokinesis. Doppler   parameters are consistent with pseduonormal left ventricular   relaxation (grade 2 diastolic dysfunction). The E/e&' ratio is   >15, suggesting elevated LV filling pressure. Isovolumic   relaxation time: 126 ms. - Aortic valve: Mildly thickened and  sclerotic leaflet tips. No   stenosis. There was trivial regurgitation. - Mitral valve: Mildly thickened leaflets . There was trivial   regurgitation. - Left atrium: Severely dilated. (49mm) - Right ventricle: The cavity size was normal. Systolic function is   moderately reduced. - Right atrium: Moderately dilated. - Inferior vena cava: The vessel was dilated. The respirophasic   diameter changes were blunted (< 50%), consistent with elevated   central venous pressure. Impressions: - LVEF 35%, severe concentric LVH (IVSd of 2.3 cm), global   hypokinesis, grade 2 DD with high LV filling pressure, sclerotic   aortic leaflet tips without stenosis, trivial AI and MR, severe   LAE and moderate RAE, moderate RV systolic dysfunction, dilated   IVC, no pericardial effusion.  Lab work is reviewed.    EP has been asked to evaluate for placement of an implantable loop recorder to monitor for atrial fibrillation.   Past Medical History:  Diagnosis Date  . BPH (benign prostatic hyperplasia)   . CHF (congestive heart failure) (HCC)   . Diabetes mellitus without complication (HCC)   . Diabetic retinopathy associated with type 2 diabetes mellitus (HCC)   . Hypertension   . Lymphedema   . OSA (obstructive sleep apnea)   . TIA (transient ischemic attack)      Surgical History:  Past Surgical History:  Procedure Laterality Date  . BLADDER SURGERY    . CARDIAC CATHETERIZATION       Prescriptions Prior to Admission  Medication Sig Dispense Refill Last Dose  . acetaminophen (TYLENOL) 325 MG tablet Take 650 mg by mouth 3 (three) times daily as needed for mild pain, moderate pain or  headache.    See note at ?  . amLODipine (NORVASC) 10 MG tablet Take 10 mg by mouth daily.   See note at ?  . aspirin 325 MG tablet Take 325 mg by mouth daily.   See note at ?  . atorvastatin (LIPITOR) 40 MG tablet Take 20 mg by mouth at bedtime.    See note at ?  . bumetanide (BUMEX) 2 MG tablet Take 2 mg by  mouth 2 (two) times daily.   See note at ?  Marland Kitchen Calcium Carbonate-Vitamin D (CALCIUM-VITAMIN D) 500-200 MG-UNIT tablet Take 1 tablet by mouth daily.    See note at ?  . carvedilol (COREG) 25 MG tablet Take 50 mg by mouth 2 (two) times daily.    See note at ?  . citalopram (CELEXA) 40 MG tablet Take 40 mg by mouth daily.   See note at ?  . doxazosin (CARDURA) 8 MG tablet Take 4 mg by mouth at bedtime.    See note at ?  . ferrous sulfate 325 (65 FE) MG tablet Take 325 mg by mouth every Monday, Wednesday, and Friday.    See note at ?  . hydrALAZINE (APRESOLINE) 100 MG tablet Take 100 mg by mouth 3 (three) times daily.   See note at ?  . insulin aspart (NOVOLOG) 100 UNIT/ML injection Inject 3 Units into the skin 3 (three) times daily before meals.    See note at ?  . insulin glargine (LANTUS) 100 UNIT/ML injection Inject 18 Units into the skin at bedtime.    See note at ?  . lisinopril (PRINIVIL,ZESTRIL) 10 MG tablet Take 10 mg by mouth daily.   See note at ?  Marland Kitchen oxybutynin (DITROPAN) 5 MG tablet Take 5 mg by mouth 3 (three) times daily.   See note at ?  . pantoprazole (PROTONIX) 40 MG tablet Take 40 mg by mouth daily. Take on an empty stomach 30 minutes before a meal   See note at ?  . polyethylene glycol (MIRALAX / GLYCOLAX) packet Take 17 g by mouth daily as needed for mild constipation or moderate constipation.   Past Month at Unknown time  . potassium chloride SA (K-DUR,KLOR-CON) 20 MEQ tablet Take 20 mEq by mouth daily.   See Note at ?  . spironolactone (ALDACTONE) 25 MG tablet Take 50 mg by mouth daily.    See note at ?  . sulfaSALAzine (AZULFIDINE) 500 MG tablet Take 2,000 mg by mouth 2 (two) times daily after a meal.    See note at ?  . tamsulosin (FLOMAX) 0.4 MG CAPS capsule Take 0.4 mg by mouth at bedtime.    See note at ?  . docusate sodium (COLACE) 100 MG capsule Take 100 mg by mouth daily as needed for mild constipation.   Not Taking at Unknown time  . furosemide (LASIX) 40 MG tablet Take 1  tablet (40 mg total) by mouth every other day. (Patient not taking: Reported on 02/06/2016) 30 tablet  Not Taking at Unknown time  . hydrocerin (EUCERIN) CREA Apply 1 application topically daily.   Not Taking at Unknown time  . ibuprofen (ADVIL,MOTRIN) 800 MG tablet Take 800 mg by mouth 3 (three) times daily as needed for headache or moderate pain (with food).   Not Taking at Unknown time  . ipratropium (ATROVENT) 0.02 % nebulizer solution Take 2.5 mLs (0.5 mg total) by nebulization every 2 (two) hours as needed for wheezing or shortness of breath. (Patient not taking:  Reported on 02/06/2016) 75 mL 0 Not Taking at Unknown time  . isosorbide mononitrate (IMDUR) 30 MG 24 hr tablet Take 30 mg by mouth daily.   Not Taking at Unknown time  . lisinopril (PRINIVIL,ZESTRIL) 5 MG tablet Take 1 tablet (5 mg total) by mouth daily. (Patient not taking: Reported on 02/06/2016) 30 tablet 0 Not Taking at Unknown time  . loperamide (IMODIUM) 2 MG capsule Take 1 capsule (2 mg total) by mouth 4 (four) times daily as needed for diarrhea or loose stools. (Patient not taking: Reported on 02/06/2016) 12 capsule 0 Not Taking at Unknown time  . metolazone (ZAROXOLYN) 5 MG tablet Take 5 mg by mouth every 3 (three) days.   See note at ?  . Multiple Vitamin (MULTIVITAMIN WITH MINERALS) TABS tablet Take 1 tablet by mouth daily.   See note at ?  . ondansetron (ZOFRAN ODT) 4 MG disintegrating tablet Take 1 tablet (4 mg total) by mouth every 8 (eight) hours as needed for nausea or vomiting. (Patient not taking: Reported on 02/06/2016) 10 tablet 0 Not Taking at Unknown time  . silver sulfADIAZINE (SILVADENE) 1 % cream Apply 1 application topically daily as needed (skin care).    Not Taking at Unknown time    Inpatient Medications:  . aspirin EC  325 mg Oral Daily   Or  . aspirin  300 mg Rectal Daily  . atorvastatin  20 mg Oral QHS  . calcium-vitamin D  1 tablet Oral Daily  . carvedilol  50 mg Oral BID  . citalopram  40 mg Oral Daily  .  clopidogrel  75 mg Oral Daily  . doxazosin  4 mg Oral QHS  . enoxaparin (LOVENOX) injection  40 mg Subcutaneous Q24H  . furosemide  80 mg Intravenous BID  . insulin aspart  0-9 Units Subcutaneous Q4H  . insulin glargine  5 Units Subcutaneous QHS  . isosorbide mononitrate  30 mg Oral Daily  . lisinopril  2.5 mg Oral Daily  . magnesium oxide  400 mg Oral BID  . multivitamin with minerals  1 tablet Oral Daily  . potassium chloride  40 mEq Oral Daily  . sodium chloride flush  3 mL Intravenous Q12H  . sulfaSALAzine  2,000 mg Oral BID  . tamsulosin  0.4 mg Oral QHS    Allergies:  Allergies  Allergen Reactions  . Chicken Allergy Other (See Comments)    Sneezing   . Sildenafil Other (See Comments)    Per the [facility], this is possibly an allergy    Social History   Social History  . Marital status: Married    Spouse name: N/A  . Number of children: N/A  . Years of education: N/A   Occupational History  . Not on file.   Social History Main Topics  . Smoking status: Never Smoker  . Smokeless tobacco: Never Used  . Alcohol use No  . Drug use: No  . Sexual activity: Not on file   Other Topics Concern  . Not on file   Social History Narrative  . No narrative on file     Family History  Problem Relation Age of Onset  . Cancer Mother   . Cancer Father       Review of Systems is limited/difficult secondary to aphasia Nausea/episode of emesis this morning, otherwise no noted active complaints   Physical Exam: Vitals:   02/08/16 0014 02/08/16 0319 02/08/16 0545 02/08/16 1023  BP:  (!) 172/107 (!) 173/100 (!) 170/107  Pulse: 88 80 84 69  Resp: 18 18 20 20   Temp:  98.2 F (36.8 C) 98.7 F (37.1 C) 98.4 F (36.9 C)  TempSrc:  Oral Oral Oral  SpO2: 99% 96% 100% 97%  Weight:  227 lb (103 kg)    Height:        GEN- The patient is in NAD, alert nod/shakes head appropriately, follows direction.   Head- normocephalic, atraumatic Eyes-  Sclera clear, conjunctiva  pink Ears- hearing intact Oropharynx- clear Neck- supple Lungs- Clear to ausculation bilaterally, normal work of breathing Heart- Regular rate and rhythm, no murmurs, rubs or gallops  GI- soft, NT, ND Extremities- no clubbing, cyanosis, +++ edema R>L MS- no significant deformity or atrophy Skin- no rash or lesion Psych- difficult to assess   Labs:   Lab Results  Component Value Date   WBC 6.2 02/08/2016   HGB 11.4 (L) 02/08/2016   HCT 34.8 (L) 02/08/2016   MCV 87.0 02/08/2016   PLT 219 02/08/2016    Recent Labs Lab 02/08/16 0710  NA 143  K 3.5  CL 107  CO2 28  BUN 17  CREATININE 1.38*  CALCIUM 8.7*  PROT 5.1*  BILITOT 0.6  ALKPHOS 181*  ALT 18  AST 27  GLUCOSE 86   No results found for: CKTOTAL, CKMB, CKMBINDEX, TROPONINI Lab Results  Component Value Date   CHOL 157 02/06/2016   Lab Results  Component Value Date   HDL 47 02/06/2016   Lab Results  Component Value Date   LDLCALC 94 02/06/2016   Lab Results  Component Value Date   TRIG 80 02/06/2016   Lab Results  Component Value Date   CHOLHDL 3.3 02/06/2016   No results found for: LDLDIRECT  No results found for: DDIMER   Radiology/Studies:  Ct Angio Head W Or Wo Contrast Result Date: 02/05/2016 CLINICAL DATA:  Initial evaluation for acute right-sided weakness, slurred speech, aphasia. EXAM: CT ANGIOGRAPHY HEAD AND NECK TECHNIQUE: Multidetector CT imaging of the head and neck was performed using the standard protocol during bolus administration of intravenous contrast. Multiplanar CT image reconstructions and MIPs were obtained to evaluate the vascular anatomy. Carotid stenosis measurements (when applicable) are obtained utilizing NASCET criteria, using the distal internal carotid diameter as the denominator. CONTRAST:  90 cc of Isovue 370. COMPARISON:  Prior CT from earlier the same day. FINDINGS: CT HEAD FINDINGS Brain: Atrophy with chronic microvascular ischemic disease again noted. Remote lacunar  infarcts within the bilateral basal ganglia. Now evident is subtle evolving hypodensity involving the left insular cortex extending towards the left frontal operculum (series 201, image 18). This is more evident as compared to previous examination performed earlier the same day. No significant mass effect. No acute intracranial hemorrhage. Hyperdensity at the level of the left sylvian fissure suspicious for thrombus (series 201, image 19). No mass lesion, midline shift, or mass effect. No hydrocephalus. No extra-axial fluid collection. Vascular: Vascular calcifications within the carotid siphons. Hyperdensity at the level the left sylvian fissure suspicious for thrombus (series 201, image 19). No other hyperdense vessel. Skull: Scalp soft tissues within normal limits.  Calvarium intact. Sinuses: Globes and orbital soft tissues within normal limits. Patient is status post scleral banding on the left. Mild opacity within the right ethmoidal air cells. Paranasal sinuses are otherwise clear. No mastoid effusion. CTA NECK FINDINGS Aortic arch: Visualized aortic arch of normal caliber with normal branch pattern. Scattered atheromatous plaque within the arch itself and about the origin of the great vessels  without flow-limiting stenosis. Visualized subclavian arteries are widely patent. Right carotid system: Right common carotid artery patent from its origin to the bifurcation. Scattered calcified plaque about the right bifurcation without flow-limiting stenosis. Right ICA patent distally to the skullbase without stenosis, dissection, or occlusion. Left carotid system: Left common carotid artery patent from its origin to the bifurcation. Scattered calcified plaque about the left bifurcation without flow-limiting stenosis. Left ICA patent distally to the skullbase without stenosis, dissection, or occlusion. Vertebral arteries: Both of the vertebral arteries arise from the subclavian arteries. The the vertebral arteries  widely patent without stenosis, dissection, or occlusion. Focal plaque noted at the origin of the right vertebral artery without high-grade stenosis. Skeleton: No acute osseous abnormality. No worrisome lytic or blastic osseous lesions. Other neck: Visualized soft tissues of the neck demonstrate no acute abnormality. Upper chest: Visualized upper mediastinum within normal limits. Layering right pleural effusion. Probable smaller left effusion. Scattered interlobular septal thickening within the partially visualized lungs, suggesting edema. Review of the MIP images confirms the above findings CTA HEAD FINDINGS Anterior circulation: Petrous segments patent bilaterally. Scattered calcified atheromatous plaque within the cavernous/ supraclinoid ICAs with mild to moderate multifocal narrowing. ICA termini patent. Left A1 segment dominant and widely patent. Right A1 segment hypoplastic but patent as well. Anterior communicating artery normal. Anterior cerebral arteries patent to their distal aspects. Right M1 segment patent without stenosis or occlusion. Right MCA bifurcation normal. Right MCA branches well opacified to their distal aspects. Left M1 segment patent without stenosis or occlusion. Left MCA bifurcation normal. No proximal M2 branch occlusion. There is abrupt occlusion of a left M3 branch, middle division (series 602, image 80). This corresponds with previously seen hyperdensity on noncontrast portion of this exam. No other arterial branch occlusion. Left MCA branches otherwise patent. Posterior circulation: Scattered plaque within the V4 segments bilaterally, right greater than left with mild multifocal narrowing. Posterior inferior cerebral arteries patent bilaterally. Basilar artery widely patent. Superior cerebellar arteries patent bilaterally. Both of the posterior cerebral arteries supplied mainly via the basilar artery and are patent to their distal aspects. Venous sinuses: Patent. Anatomic variants: No  significant anatomic variant. No aneurysm or vascular malformation. Delayed phase: Not performed. Review of the MIP images confirms the above findings IMPRESSION: 1. 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 2. Scattered atheromatous plaque throughout the carotid siphons with mild to moderate multifocal narrowing. 3. Atheromatous plaque about the carotid bifurcations bilaterally without flow-limiting stenosis. 4. Large layering right pleural effusion, with smaller left pleural effusion. Interlobular septal thickening suggestive of pulmonary edema. Critical Value/emergent results were called by telephone at the time of interpretation on 02/05/2016 at 6:19 pm to Dr. Ritta Slot , who verbally acknowledged these results. Electronically Signed   By: Rise Mu M.D.   On: 02/05/2016 19:04   Ct Head Wo Contrast Result Date: 02/05/2016 CLINICAL DATA:  Altered mental status, aphasia, right-sided weakness EXAM: CT HEAD WITHOUT CONTRAST TECHNIQUE: Contiguous axial images were obtained from the base of the skull through the vertex without intravenous contrast. COMPARISON:  MRI 05/19/2000 FINDINGS: Brain: Old lacunar infarct in the left basal ganglia. Old infarct and encephalomalacia in the right occipital lobe. No acute intracranial abnormality. Specifically, no hemorrhage, hydrocephalus, mass lesion, acute infarction, or significant intracranial injury. Vascular: No hyperdense vessel or unexpected calcification. Skull: No acute calvarial abnormality. Sinuses/Orbits: Visualized paranasal sinuses and mastoids clear. Orbital soft tissues unremarkable. Other: None IMPRESSION: Old left basal  ganglia and right occipital infarct. No acute intracranial abnormality. Electronically Signed   By: Charlett Nose M.D.   On: 02/05/2016 15:25   Mr Brain Wo Contrast Result Date: 02/06/2016 CLINICAL DATA:  Acute on chronic RIGHT-sided weakness, aphasia.  Follow-up M3 occlusion. Assess stroke versus PRES. History of hypertension and diabetes. EXAM: MRI HEAD WITHOUT CONTRAST TECHNIQUE: Multiplanar, multiecho pulse sequences of the brain and surrounding structures were obtained without intravenous contrast. COMPARISON:  CT HEAD February 05, 2016 and MRI of the head May 20, 2015 FINDINGS: BRAIN: Confluent reduced diffusion measuring up to 5.8 cm in LEFT frontal lobe, involving the insula and operculum with low ADC values. Non contiguous patchy reduced diffusion LEFT anterior frontal lobe. No susceptibility artifact to suggest acute hemorrhage. The ventricles and sulci are normal for patient's age. Small area RIGHT occipital lobe encephalomalacia. Old LEFT pontomedullary infarcts. Old bilateral basal ganglia lacunar infarcts. Symmetric basal ganglia susceptibility artifact most consistent with old hemorrhage. Patchy supratentorial and pontine white matter FLAIR T2 hyperintensities. Mild ventriculomegaly on the basis of global parenchymal brain volume loss. No intraparenchymal mass effect, masses. No abnormal extra-axial fluid collections. VASCULAR: Normal major intracranial vascular flow voids present at skull base. SKULL AND UPPER CERVICAL SPINE: Empty sella. No suspicious calvarial bone marrow signal. Craniocervical junction maintained. SINUSES/ORBITS: Mild paranasal sinus mucosal thickening. The included ocular globes and orbital contents are non-suspicious. Status post LEFT scleral banding and ocular lens implant. OTHER: None. IMPRESSION: Acute small to moderate LEFT frontal lobe/ MCA territory infarct corresponding to perfusion abnormality. 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 Old basal ganglia and pontomedullary lacunar infarcts. Old RIGHT occipital lobe/ PCA territory infarct. Mild to moderate chronic small vessel ischemic disease. Mild atrophy. Electronically Signed   By: Awilda Metro M.D.   On: 02/06/2016 06:01   Ct Cerebral Perfusion W  Contrast Result Date: 02/05/2016 CLINICAL DATA:  Initial evaluation for acute right-sided weakness. EXAM: CT PERFUSION BRAIN TECHNIQUE: Multiphase CT imaging of the brain was performed following IV bolus contrast injection. Subsequent parametric perfusion maps were calculated using RAPID software. CONTRAST:  90 cc of Isovue 370. FINDINGS: CT Brain Perfusion Findings: CBF (<30%) Volume: 21 ccmL Perfusion (Tmax>6.0s) volume: 92 ccmL Mismatch Volume: 71 ccmL Infarction Location:Core infarct primarily involves the left frontal operculum/anterior left frontal lobe, with some extension towards the left insula and left temporal lobe. Please note that while the mismatch volume is measured as 71 cc of this exam, there is felt to be some artifact on the T max perfusion images, with elevated T-max seen not only around the core infarct, but also within the right cerebral hemisphere as well as the right cerebellar hemisphere. Overall, while there is likely some penumbra about the area of core infarction, the recorded 71 cc is likely not accurate as to the volume of actual mismatch (which is likely significantly less, although difficult to accurately quantify on this exam). IMPRESSION: Acute small to moderate-sized left MCA territory infarct as above. Critical Value/emergent results were called by telephone at the time of interpretation on 02/05/2016 at 6:19 pm to Dr. Ritta Slot , who verbally acknowledged these results. Electronically Signed   By: Rise Mu M.D.   On: 02/05/2016 18:27    12-lead ECG SR All prior EKG's in EPIC reviewed with no documented atrial fibrillation  Telemetry was reviewed by Dr. Graciela Husbands noting SR, frequent PAC's  Assessment and Plan:  1. Cryptogenic stroke 2. 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 Hypertensive heart disease 4. Sleep apnea 5. Diabetes The patient presents with cryptogenic  stroke.  The patient has a TEE planned for this afternoon.  Dr. Graciela Husbands spoke at length  with the patient about monitoring for afib with either a 30 day event monitor or an implantable loop recorder.  Risks, benefits, and alteratives to implantable loop recorder were discussed with the patient and family at bedside today.   At this time, the they are very clear in their decision to proceed with implantable loop recorder.   Wound care was reviewed with the patient (keep incision clean and dry for 3 days).  Wound check will be scheduled for the patient.    Renee Norberto Sorenson, PA-C 02/08/2016   Pt was examined, chart reviewed and family queried  Reviewed the indications for rhythm monitoring with the patient's brother who is his power of attorney; the patient himself is a phasic but appears to understand.  I also reviewed the case briefly with Dr. Anne Fu and we will place him on an ACE inhibitor given his cardiac myopathy and his diabetes.  Sherryl Manges

## 2016-02-08 NOTE — CV Procedure (Signed)
    Transesophageal Echocardiogram Note  [name] [id number] 1960/07/28  Procedure: Transesophageal Echocardiogram Indications: CVA  Procedure Details Consent: Obtained Time Out: Verified patient identification, verified procedure, site/side was marked, verified correct patient position, special equipment/implants available, Radiology Safety Procedures followed,  medications/allergies/relevent history reviewed, required imaging and test results available.  Performed  Medications:  During this procedure the patient is administered a total of Versed 4 mg and Fentanyl 25 mcg  to achieve and maintain moderate conscious sedation.  The patient's heart rate, blood pressure, and oxygen saturation are monitored continuously during the procedure. The period of conscious sedation is 30  minutes, of which I was present face-to-face 100% of this time.  Left Ventrical:  Moderate - severe LV dysfunction - EF 30%  Mitral Valve: trace MR  Aortic Valve: trace AI  Tricuspid Valve: mild  TR   Pulmonic Valve: Trace PI  Left Atrium/ Left atrial appendage: no thrombi  Atrial septum: no ASD or PFO by color flow or bubble contrast.   Aorta: very minimal aortic atheroma    Complications: No apparent complications Patient did tolerate procedure well.   Vesta Mixer, Montez Hageman., MD, [facility] 02/08/2016, 2:29 PM

## 2016-02-08 NOTE — Care Management Important Message (Signed)
Important Message  Patient Details  Name: [name] MRN: [id number] Date of Birth: 1960-10-28   Medicare Important Message Given:  Yes    Anes Rigel Stefan Church 02/08/2016, 12:53 PM

## 2016-02-08 NOTE — Progress Notes (Signed)
Physical Therapy Treatment Patient Details Name: [name] MRN: [id number] DOB: February 18, 1960 Today's Date: 02/08/2016    History of Present Illness 56 y.o. 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  Pt with significant PMHx of TIA, HTN, DM with diabetic retinopathy, and CHF.    PT Comments    Pt performed increased mobility from previous session but remains to require +2 external assist to function safely.  Pt upon standing began to urinate for a lengthy period of time, required  Increased ADLs to clean patient during co-tx.  Pt would continue to benefit from CIR placement.     Follow Up Recommendations  CIR     Equipment Recommendations  3in1 (PT);Rolling walker with 5" wheels;Other (comment)    Recommendations for Other Services Rehab consult     Precautions / Restrictions Precautions Precautions: Fall Precaution Comments: watch BP, right sided weakness Restrictions Weight Bearing Restrictions: No    Mobility  Bed Mobility Overal bed mobility: Needs Assistance Bed Mobility: Supine to Sit Rolling: Mod assist;Min assist   Supine to sit: Supervision     General bed mobility comments: Supervision for safety, pt coming out of right side of bed and when given extra time, and use of railing he was able to work his way up to sitting.   Transfers Overall transfer level: Needs assistance Equipment used: Rolling walker (2 wheeled) Transfers: Sit to/from Stand Sit to Stand: +2 physical assistance;Mod assist         General transfer comment: Cues for hand placement assist to boost and facilitate use of RUE to push and reach.    Ambulation/Gait Ambulation/Gait assistance: Mod assist;+2 physical assistance (to facilitate use of RUE on RW.  ) Ambulation Distance (Feet): 80 Feet Assistive device: Rolling walker (2 wheeled) Gait Pattern/deviations: Step-through pattern;Trunk flexed;Decreased stride  length;Shuffle Gait velocity: decreased Gait velocity interpretation: Below normal speed for age/gender General Gait Details: Pt remains to require assist with RUE.  Cues for sequencing and forward gaze, required increased assistance to turn.     Stairs            Wheelchair Mobility    Modified Rankin (Stroke Patients Only)       Balance Overall balance assessment: Needs assistance   Sitting balance-Leahy Scale: Fair Sitting balance - Comments: Pt needed assist in positioning his right hand flat on the bed as he had it curled under and was WB through his wrist      Standing balance-Leahy Scale: Poor Standing balance comment: needs external assist in standing.                     Cognition Arousal/Alertness: Awake/alert Behavior During Therapy: WFL for tasks assessed/performed Overall Cognitive Status: Impaired/Different from baseline Area of Impairment: Attention;Following commands;Safety/judgement;Awareness;Problem solving   Current Attention Level: Sustained   Following Commands: Follows one step commands consistently;Follows multi-step commands inconsistently;Follows multi-step commands with increased time Safety/Judgement: Decreased awareness of safety;Decreased awareness of deficits Awareness: Intellectual Problem Solving: Difficulty sequencing;Requires verbal cues;Requires tactile cues General Comments: Intermittent attention to RUE today.      Exercises      General Comments        Pertinent Vitals/Pain Pain Assessment: No/denies pain    Home Living                      Prior Function  PT Goals (current goals can now be found in the care plan section) Acute Rehab PT Goals Patient Stated Goal: Wants to walk Potential to Achieve Goals: Good Progress towards PT goals: Progressing toward goals    Frequency    Min 4X/week      PT Plan Current plan remains appropriate    Co-evaluation   Reason for  Co-Treatment: Complexity of the patient's impairments (multi-system involvement);For patient/therapist safety PT goals addressed during session: Mobility/safety with mobility;Balance OT goals addressed during session: ADL's and self-care;Proper use of Adaptive equipment and DME     End of Session Equipment Utilized During Treatment: Gait belt Activity Tolerance: Patient limited by fatigue Patient left: in chair;with call bell/phone within reach;with chair alarm set     Time: [phone] PT Time Calculation (min) (ACUTE ONLY): 39 min  Charges:  $Gait Training: 8-22 mins                    G Codes:      Florestine Avers 02/20/2016, 1:08 PM  Joycelyn Rua, PTA pager [phone]

## 2016-02-08 NOTE — H&P (View-Only) (Signed)
  ELECTROPHYSIOLOGY CONSULT NOTE  Patient ID: [name] MRN: [id number], DOB/AGE: 05/12/1960   Admit date: 02/05/2016 Date of Consult: 02/08/2016  Primary Physician: [facility] Primary Cardiologist: new to [facility], Dr. Skains Reason for Consultation: Cryptogenic stroke -  recommendations regarding Implantable Loop Recorder  History of Present Illness [name] was admitted on 02/05/2016 with CVA.  PMHx of chronic diastolic CHF (EF 50-55% in 04/2015), DMT2, HTN, ulcerative colitis, OSA on CPAP, prior CVA and depression.  They first developed symptoms while at home on the phone of slurred speech and dropped the phone, and facial droop.  He was also noted to have a clear decrease in his EF, markedly edematous with CHF and cardiology was called in and following, no ischemic w/u is planned at least at this time.   He remains with R sided deficit and expressive aphasia.  Imaging demonstrated Dominant left frontal/MCA territory infarct embolic pattern secondary to unknown source.  he has undergone workup for stroke including echocardiogram and carotid angio, LE dopplers are negative for DVT b/l.  The patient has been monitored on telemetry which has demonstrated sinus rhythm with no arrhythmias.  Inpatient stroke work-up is to be completed with a TEE.   Echocardiogram this admission demonstrated   Study Conclusions - Left ventricle: The cavity size was normal. Wall thickness was   increased in a pattern of severe LVH. LV mass index 356 g/m2. RWT   of 0.73, suggesting severely abnormal concentric hypertrophy.   Systolic function was moderately to severely reduced. The   estimated ejection fraction was 35%. Diffuse hypokinesis. Doppler   parameters are consistent with pseduonormal left ventricular   relaxation (grade 2 diastolic dysfunction). The E/e&' ratio is   >15, suggesting elevated LV filling pressure. Isovolumic   relaxation time: 126 ms. - Aortic valve: Mildly thickened and  sclerotic leaflet tips. No   stenosis. There was trivial regurgitation. - Mitral valve: Mildly thickened leaflets . There was trivial   regurgitation. - Left atrium: Severely dilated. (49mm) - Right ventricle: The cavity size was normal. Systolic function is   moderately reduced. - Right atrium: Moderately dilated. - Inferior vena cava: The vessel was dilated. The respirophasic   diameter changes were blunted (< 50%), consistent with elevated   central venous pressure. Impressions: - LVEF 35%, severe concentric LVH (IVSd of 2.3 cm), global   hypokinesis, grade 2 DD with high LV filling pressure, sclerotic   aortic leaflet tips without stenosis, trivial AI and MR, severe   LAE and moderate RAE, moderate RV systolic dysfunction, dilated   IVC, no pericardial effusion.  Lab work is reviewed.    EP has been asked to evaluate for placement of an implantable loop recorder to monitor for atrial fibrillation.   Past Medical History:  Diagnosis Date  . BPH (benign prostatic hyperplasia)   . CHF (congestive heart failure) (HCC)   . Diabetes mellitus without complication (HCC)   . Diabetic retinopathy associated with type 2 diabetes mellitus (HCC)   . Hypertension   . Lymphedema   . OSA (obstructive sleep apnea)   . TIA (transient ischemic attack)      Surgical History:  Past Surgical History:  Procedure Laterality Date  . BLADDER SURGERY    . CARDIAC CATHETERIZATION       Prescriptions Prior to Admission  Medication Sig Dispense Refill Last Dose  . acetaminophen (TYLENOL) 325 MG tablet Take 650 mg by mouth 3 (three) times daily as needed for mild pain, moderate pain or   headache.    See note at ?  . amLODipine (NORVASC) 10 MG tablet Take 10 mg by mouth daily.   See note at ?  . aspirin 325 MG tablet Take 325 mg by mouth daily.   See note at ?  . atorvastatin (LIPITOR) 40 MG tablet Take 20 mg by mouth at bedtime.    See note at ?  . bumetanide (BUMEX) 2 MG tablet Take 2 mg by  mouth 2 (two) times daily.   See note at ?  . Calcium Carbonate-Vitamin D (CALCIUM-VITAMIN D) 500-200 MG-UNIT tablet Take 1 tablet by mouth daily.    See note at ?  . carvedilol (COREG) 25 MG tablet Take 50 mg by mouth 2 (two) times daily.    See note at ?  . citalopram (CELEXA) 40 MG tablet Take 40 mg by mouth daily.   See note at ?  . doxazosin (CARDURA) 8 MG tablet Take 4 mg by mouth at bedtime.    See note at ?  . ferrous sulfate 325 (65 FE) MG tablet Take 325 mg by mouth every Monday, Wednesday, and Friday.    See note at ?  . hydrALAZINE (APRESOLINE) 100 MG tablet Take 100 mg by mouth 3 (three) times daily.   See note at ?  . insulin aspart (NOVOLOG) 100 UNIT/ML injection Inject 3 Units into the skin 3 (three) times daily before meals.    See note at ?  . insulin glargine (LANTUS) 100 UNIT/ML injection Inject 18 Units into the skin at bedtime.    See note at ?  . lisinopril (PRINIVIL,ZESTRIL) 10 MG tablet Take 10 mg by mouth daily.   See note at ?  . oxybutynin (DITROPAN) 5 MG tablet Take 5 mg by mouth 3 (three) times daily.   See note at ?  . pantoprazole (PROTONIX) 40 MG tablet Take 40 mg by mouth daily. Take on an empty stomach 30 minutes before a meal   See note at ?  . polyethylene glycol (MIRALAX / GLYCOLAX) packet Take 17 g by mouth daily as needed for mild constipation or moderate constipation.   Past Month at Unknown time  . potassium chloride SA (K-DUR,KLOR-CON) 20 MEQ tablet Take 20 mEq by mouth daily.   See Note at ?  . spironolactone (ALDACTONE) 25 MG tablet Take 50 mg by mouth daily.    See note at ?  . sulfaSALAzine (AZULFIDINE) 500 MG tablet Take 2,000 mg by mouth 2 (two) times daily after a meal.    See note at ?  . tamsulosin (FLOMAX) 0.4 MG CAPS capsule Take 0.4 mg by mouth at bedtime.    See note at ?  . docusate sodium (COLACE) 100 MG capsule Take 100 mg by mouth daily as needed for mild constipation.   Not Taking at Unknown time  . furosemide (LASIX) 40 MG tablet Take 1  tablet (40 mg total) by mouth every other day. (Patient not taking: Reported on 02/06/2016) 30 tablet  Not Taking at Unknown time  . hydrocerin (EUCERIN) CREA Apply 1 application topically daily.   Not Taking at Unknown time  . ibuprofen (ADVIL,MOTRIN) 800 MG tablet Take 800 mg by mouth 3 (three) times daily as needed for headache or moderate pain (with food).   Not Taking at Unknown time  . ipratropium (ATROVENT) 0.02 % nebulizer solution Take 2.5 mLs (0.5 mg total) by nebulization every 2 (two) hours as needed for wheezing or shortness of breath. (Patient not taking:   Reported on 02/06/2016) 75 mL 0 Not Taking at Unknown time  . isosorbide mononitrate (IMDUR) 30 MG 24 hr tablet Take 30 mg by mouth daily.   Not Taking at Unknown time  . lisinopril (PRINIVIL,ZESTRIL) 5 MG tablet Take 1 tablet (5 mg total) by mouth daily. (Patient not taking: Reported on 02/06/2016) 30 tablet 0 Not Taking at Unknown time  . loperamide (IMODIUM) 2 MG capsule Take 1 capsule (2 mg total) by mouth 4 (four) times daily as needed for diarrhea or loose stools. (Patient not taking: Reported on 02/06/2016) 12 capsule 0 Not Taking at Unknown time  . metolazone (ZAROXOLYN) 5 MG tablet Take 5 mg by mouth every 3 (three) days.   See note at ?  . Multiple Vitamin (MULTIVITAMIN WITH MINERALS) TABS tablet Take 1 tablet by mouth daily.   See note at ?  . ondansetron (ZOFRAN ODT) 4 MG disintegrating tablet Take 1 tablet (4 mg total) by mouth every 8 (eight) hours as needed for nausea or vomiting. (Patient not taking: Reported on 02/06/2016) 10 tablet 0 Not Taking at Unknown time  . silver sulfADIAZINE (SILVADENE) 1 % cream Apply 1 application topically daily as needed (skin care).    Not Taking at Unknown time    Inpatient Medications:  . aspirin EC  325 mg Oral Daily   Or  . aspirin  300 mg Rectal Daily  . atorvastatin  20 mg Oral QHS  . calcium-vitamin D  1 tablet Oral Daily  . carvedilol  50 mg Oral BID  . citalopram  40 mg Oral Daily  .  clopidogrel  75 mg Oral Daily  . doxazosin  4 mg Oral QHS  . enoxaparin (LOVENOX) injection  40 mg Subcutaneous Q24H  . furosemide  80 mg Intravenous BID  . insulin aspart  0-9 Units Subcutaneous Q4H  . insulin glargine  5 Units Subcutaneous QHS  . isosorbide mononitrate  30 mg Oral Daily  . lisinopril  2.5 mg Oral Daily  . magnesium oxide  400 mg Oral BID  . multivitamin with minerals  1 tablet Oral Daily  . potassium chloride  40 mEq Oral Daily  . sodium chloride flush  3 mL Intravenous Q12H  . sulfaSALAzine  2,000 mg Oral BID  . tamsulosin  0.4 mg Oral QHS    Allergies:  Allergies  Allergen Reactions  . Chicken Allergy Other (See Comments)    Sneezing   . Sildenafil Other (See Comments)    Per the [facility], this is possibly an allergy    Social History   Social History  . Marital status: Married    Spouse name: N/A  . Number of children: N/A  . Years of education: N/A   Occupational History  . Not on file.   Social History Main Topics  . Smoking status: Never Smoker  . Smokeless tobacco: Never Used  . Alcohol use No  . Drug use: No  . Sexual activity: Not on file   Other Topics Concern  . Not on file   Social History Narrative  . No narrative on file     Family History  Problem Relation Age of Onset  . Cancer Mother   . Cancer Father       Review of Systems is limited/difficult secondary to aphasia Nausea/episode of emesis this morning, otherwise no noted active complaints   Physical Exam: Vitals:   02/08/16 0014 02/08/16 0319 02/08/16 0545 02/08/16 1023  BP:  (!) 172/107 (!) 173/100 (!) 170/107    Pulse: 88 80 84 69  Resp: 18 18 20 20  Temp:  98.2 F (36.8 C) 98.7 F (37.1 C) 98.4 F (36.9 C)  TempSrc:  Oral Oral Oral  SpO2: 99% 96% 100% 97%  Weight:  227 lb (103 kg)    Height:        GEN- The patient is in NAD, alert nod/shakes head appropriately, follows direction.   Head- normocephalic, atraumatic Eyes-  Sclera clear, conjunctiva  pink Ears- hearing intact Oropharynx- clear Neck- supple Lungs- Clear to ausculation bilaterally, normal work of breathing Heart- Regular rate and rhythm, no murmurs, rubs or gallops  GI- soft, NT, ND Extremities- no clubbing, cyanosis, +++ edema R>L MS- no significant deformity or atrophy Skin- no rash or lesion Psych- difficult to assess   Labs:   Lab Results  Component Value Date   WBC 6.2 02/08/2016   HGB 11.4 (L) 02/08/2016   HCT 34.8 (L) 02/08/2016   MCV 87.0 02/08/2016   PLT 219 02/08/2016    Recent Labs Lab 02/08/16 0710  NA 143  K 3.5  CL 107  CO2 28  BUN 17  CREATININE 1.38*  CALCIUM 8.7*  PROT 5.1*  BILITOT 0.6  ALKPHOS 181*  ALT 18  AST 27  GLUCOSE 86   No results found for: CKTOTAL, CKMB, CKMBINDEX, TROPONINI Lab Results  Component Value Date   CHOL 157 02/06/2016   Lab Results  Component Value Date   HDL 47 02/06/2016   Lab Results  Component Value Date   LDLCALC 94 02/06/2016   Lab Results  Component Value Date   TRIG 80 02/06/2016   Lab Results  Component Value Date   CHOLHDL 3.3 02/06/2016   No results found for: LDLDIRECT  No results found for: DDIMER   Radiology/Studies:  Ct Angio Head W Or Wo Contrast Result Date: 02/05/2016 CLINICAL DATA:  Initial evaluation for acute right-sided weakness, slurred speech, aphasia. EXAM: CT ANGIOGRAPHY HEAD AND NECK TECHNIQUE: Multidetector CT imaging of the head and neck was performed using the standard protocol during bolus administration of intravenous contrast. Multiplanar CT image reconstructions and MIPs were obtained to evaluate the vascular anatomy. Carotid stenosis measurements (when applicable) are obtained utilizing NASCET criteria, using the distal internal carotid diameter as the denominator. CONTRAST:  90 cc of Isovue 370. COMPARISON:  Prior CT from earlier the same day. FINDINGS: CT HEAD FINDINGS Brain: Atrophy with chronic microvascular ischemic disease again noted. Remote lacunar  infarcts within the bilateral basal ganglia. Now evident is subtle evolving hypodensity involving the left insular cortex extending towards the left frontal operculum (series 201, image 18). This is more evident as compared to previous examination performed earlier the same day. No significant mass effect. No acute intracranial hemorrhage. Hyperdensity at the level of the left sylvian fissure suspicious for thrombus (series 201, image 19). No mass lesion, midline shift, or mass effect. No hydrocephalus. No extra-axial fluid collection. Vascular: Vascular calcifications within the carotid siphons. Hyperdensity at the level the left sylvian fissure suspicious for thrombus (series 201, image 19). No other hyperdense vessel. Skull: Scalp soft tissues within normal limits.  Calvarium intact. Sinuses: Globes and orbital soft tissues within normal limits. Patient is status post scleral banding on the left. Mild opacity within the right ethmoidal air cells. Paranasal sinuses are otherwise clear. No mastoid effusion. CTA NECK FINDINGS Aortic arch: Visualized aortic arch of normal caliber with normal branch pattern. Scattered atheromatous plaque within the arch itself and about the origin of the great vessels   without flow-limiting stenosis. Visualized subclavian arteries are widely patent. Right carotid system: Right common carotid artery patent from its origin to the bifurcation. Scattered calcified plaque about the right bifurcation without flow-limiting stenosis. Right ICA patent distally to the skullbase without stenosis, dissection, or occlusion. Left carotid system: Left common carotid artery patent from its origin to the bifurcation. Scattered calcified plaque about the left bifurcation without flow-limiting stenosis. Left ICA patent distally to the skullbase without stenosis, dissection, or occlusion. Vertebral arteries: Both of the vertebral arteries arise from the subclavian arteries. The the vertebral arteries  widely patent without stenosis, dissection, or occlusion. Focal plaque noted at the origin of the right vertebral artery without high-grade stenosis. Skeleton: No acute osseous abnormality. No worrisome lytic or blastic osseous lesions. Other neck: Visualized soft tissues of the neck demonstrate no acute abnormality. Upper chest: Visualized upper mediastinum within normal limits. Layering right pleural effusion. Probable smaller left effusion. Scattered interlobular septal thickening within the partially visualized lungs, suggesting edema. Review of the MIP images confirms the above findings CTA HEAD FINDINGS Anterior circulation: Petrous segments patent bilaterally. Scattered calcified atheromatous plaque within the cavernous/ supraclinoid ICAs with mild to moderate multifocal narrowing. ICA termini patent. Left A1 segment dominant and widely patent. Right A1 segment hypoplastic but patent as well. Anterior communicating artery normal. Anterior cerebral arteries patent to their distal aspects. Right M1 segment patent without stenosis or occlusion. Right MCA bifurcation normal. Right MCA branches well opacified to their distal aspects. Left M1 segment patent without stenosis or occlusion. Left MCA bifurcation normal. No proximal M2 branch occlusion. There is abrupt occlusion of a left M3 branch, middle division (series 602, image 80). This corresponds with previously seen hyperdensity on noncontrast portion of this exam. No other arterial branch occlusion. Left MCA branches otherwise patent. Posterior circulation: Scattered plaque within the V4 segments bilaterally, right greater than left with mild multifocal narrowing. Posterior inferior cerebral arteries patent bilaterally. Basilar artery widely patent. Superior cerebellar arteries patent bilaterally. Both of the posterior cerebral arteries supplied mainly via the basilar artery and are patent to their distal aspects. Venous sinuses: Patent. Anatomic variants: No  significant anatomic variant. No aneurysm or vascular malformation. Delayed phase: Not performed. Review of the MIP images confirms the above findings IMPRESSION: 1. 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 2. Scattered atheromatous plaque throughout the carotid siphons with mild to moderate multifocal narrowing. 3. Atheromatous plaque about the carotid bifurcations bilaterally without flow-limiting stenosis. 4. Large layering right pleural effusion, with smaller left pleural effusion. Interlobular septal thickening suggestive of pulmonary edema. Critical Value/emergent results were called by telephone at the time of interpretation on 02/05/2016 at 6:19 pm to Dr. MCNEILL KIRKPATRICK , who verbally acknowledged these results. Electronically Signed   By: Benjamin  McClintock M.D.   On: 02/05/2016 19:04   Ct Head Wo Contrast Result Date: 02/05/2016 CLINICAL DATA:  Altered mental status, aphasia, right-sided weakness EXAM: CT HEAD WITHOUT CONTRAST TECHNIQUE: Contiguous axial images were obtained from the base of the skull through the vertex without intravenous contrast. COMPARISON:  MRI 05/19/2000 FINDINGS: Brain: Old lacunar infarct in the left basal ganglia. Old infarct and encephalomalacia in the right occipital lobe. No acute intracranial abnormality. Specifically, no hemorrhage, hydrocephalus, mass lesion, acute infarction, or significant intracranial injury. Vascular: No hyperdense vessel or unexpected calcification. Skull: No acute calvarial abnormality. Sinuses/Orbits: Visualized paranasal sinuses and mastoids clear. Orbital soft tissues unremarkable. Other: None IMPRESSION: Old left basal   ganglia and right occipital infarct. No acute intracranial abnormality. Electronically Signed   By: Kevin  Dover M.D.   On: 02/05/2016 15:25   Mr Brain Wo Contrast Result Date: 02/06/2016 CLINICAL DATA:  Acute on chronic RIGHT-sided weakness, aphasia.  Follow-up M3 occlusion. Assess stroke versus PRES. History of hypertension and diabetes. EXAM: MRI HEAD WITHOUT CONTRAST TECHNIQUE: Multiplanar, multiecho pulse sequences of the brain and surrounding structures were obtained without intravenous contrast. COMPARISON:  CT HEAD February 05, 2016 and MRI of the head May 20, 2015 FINDINGS: BRAIN: Confluent reduced diffusion measuring up to 5.8 cm in LEFT frontal lobe, involving the insula and operculum with low ADC values. Non contiguous patchy reduced diffusion LEFT anterior frontal lobe. No susceptibility artifact to suggest acute hemorrhage. The ventricles and sulci are normal for patient's age. Small area RIGHT occipital lobe encephalomalacia. Old LEFT pontomedullary infarcts. Old bilateral basal ganglia lacunar infarcts. Symmetric basal ganglia susceptibility artifact most consistent with old hemorrhage. Patchy supratentorial and pontine white matter FLAIR T2 hyperintensities. Mild ventriculomegaly on the basis of global parenchymal brain volume loss. No intraparenchymal mass effect, masses. No abnormal extra-axial fluid collections. VASCULAR: Normal major intracranial vascular flow voids present at skull base. SKULL AND UPPER CERVICAL SPINE: Empty sella. No suspicious calvarial bone marrow signal. Craniocervical junction maintained. SINUSES/ORBITS: Mild paranasal sinus mucosal thickening. The included ocular globes and orbital contents are non-suspicious. Status post LEFT scleral banding and ocular lens implant. OTHER: None. IMPRESSION: Acute small to moderate LEFT frontal lobe/ MCA territory infarct corresponding to perfusion abnormality. 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 Old basal ganglia and pontomedullary lacunar infarcts. Old RIGHT occipital lobe/ PCA territory infarct. Mild to moderate chronic small vessel ischemic disease. Mild atrophy. Electronically Signed   By: Courtnay  Bloomer M.D.   On: 02/06/2016 06:01   Ct Cerebral Perfusion W  Contrast Result Date: 02/05/2016 CLINICAL DATA:  Initial evaluation for acute right-sided weakness. EXAM: CT PERFUSION BRAIN TECHNIQUE: Multiphase CT imaging of the brain was performed following IV bolus contrast injection. Subsequent parametric perfusion maps were calculated using RAPID software. CONTRAST:  90 cc of Isovue 370. FINDINGS: CT Brain Perfusion Findings: CBF (<30%) Volume: 21 ccmL Perfusion (Tmax>6.0s) volume: 92 ccmL Mismatch Volume: 71 ccmL Infarction Location:Core infarct primarily involves the left frontal operculum/anterior left frontal lobe, with some extension towards the left insula and left temporal lobe. Please note that while the mismatch volume is measured as 71 cc of this exam, there is felt to be some artifact on the T max perfusion images, with elevated T-max seen not only around the core infarct, but also within the right cerebral hemisphere as well as the right cerebellar hemisphere. Overall, while there is likely some penumbra about the area of core infarction, the recorded 71 cc is likely not accurate as to the volume of actual mismatch (which is likely significantly less, although difficult to accurately quantify on this exam). IMPRESSION: Acute small to moderate-sized left MCA territory infarct as above. Critical Value/emergent results were called by telephone at the time of interpretation on 02/05/2016 at 6:19 pm to Dr. MCNEILL KIRKPATRICK , who verbally acknowledged these results. Electronically Signed   By: Benjamin  McClintock M.D.   On: 02/05/2016 18:27    12-lead ECG SR All prior EKG's in EPIC reviewed with no documented atrial fibrillation  Telemetry was reviewed by Dr. Armie Moren noting SR, frequent PAC's  Assessment and Plan:  1. Cryptogenic stroke 2. 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 Hypertensive heart disease 4. Sleep apnea 5. Diabetes The patient presents with cryptogenic   stroke.  The patient has a TEE planned for this afternoon.  Dr. Sintia Mckissic spoke at length  with the patient about monitoring for afib with either a 30 day event monitor or an implantable loop recorder.  Risks, benefits, and alteratives to implantable loop recorder were discussed with the patient and family at bedside today.   At this time, the they are very clear in their decision to proceed with implantable loop recorder.   Wound care was reviewed with the patient (keep incision clean and dry for 3 days).  Wound check will be scheduled for the patient.    Renee Lynn Ursuy, PA-C 02/08/2016   Pt was examined, chart reviewed and family queried  Reviewed the indications for rhythm monitoring with the patient's brother who is his power of attorney; the patient himself is a phasic but appears to understand.  I also reviewed the case briefly with Dr. Skains and we will place him on an ACE inhibitor given his cardiac myopathy and his diabetes.  [name]  

## 2016-02-08 NOTE — Progress Notes (Signed)
Placed patient on CPAP for the night with oxygen set at 2lpm. 

## 2016-02-08 NOTE — Progress Notes (Signed)
Hypoglycemic Event  CBG:66  Treatment: 15 GM carbohydrate snack  Symptoms: None  Follow-up CBG: Time:0155 CBG Result:94  Possible Reasons for Event: Inadequate meal intake  Comments/MD notified:no new orders/ followed protocol    [name]

## 2016-02-08 NOTE — Progress Notes (Signed)
Inpatient Diabetes Program Recommendations  AACE/ADA: New Consensus Statement on Inpatient Glycemic Control (2015)  Target Ranges:  Prepandial:   less than 140 mg/dL      Peak postprandial:   less than 180 mg/dL (1-2 hours)      Critically ill patients:  140 - 180 mg/dL  Results for [name], [name] (MRN [id number]) as of 02/08/2016 07:14  Ref. Range 02/07/2016 05:01 02/07/2016 07:33 02/07/2016 11:57 02/07/2016 16:52 02/07/2016 20:24 02/08/2016 00:55 02/08/2016 02:01 02/08/2016 03:47  Glucose-Capillary Latest Ref Range: 65 - 99 mg/dL 78 67 400 (H) 867 (H) 619 (H) 66 94 92    Review of Glycemic Control  Diabetes history: DM2 Outpatient Diabetes medications: Lantus 18 units QHS, Novolog 3 units TID with meals Current orders for Inpatient glycemic control: Lantus 12 units QHS, Novolog 0-9 units Q4H   Inpatient Diabetes Program Recommendations:  Insulin - Basal: Last dose of Lantus 16 units was given on 02/06/16 at 22:55. In reviewing chart, noted patient did NOT receive any Lantus last night and glucose down to 66 mg/dl at 50:93 on 03/11/69. Please consider decreasing Lantus to 5 units QHS and if any further hypoglycemia noted with decreased Lantus dose, MD may want to consider discontinuing Lantus for now.  Thanks, Orlando Penner, RN, MSN, CDE Diabetes Coordinator Inpatient Diabetes Program [phone] (Team Pager from 8am to 5pm)

## 2016-02-08 NOTE — Progress Notes (Signed)
[facility] PROGRESS NOTE  [name] WUJ:[id number] DOB: May 12, 1960 DOA: 02/05/2016   PCP: [facility]     Assessment/Plan: Principal Problem:   Stroke (cerebrum) (HCC) Active Problems:   AKI (acute kidney injury) (HCC)   Essential hypertension   Acute on chronic diastolic CHF (congestive heart failure) (HCC)   Anemia of chronic disease   Diabetes mellitus, type II, insulin dependent (HCC)   OSA ? compliacne   Hypokalemia   Hypertensive urgency   Acute CVA (cerebrovascular accident) (HCC)   Cerebral infarction due to occlusion of left middle cerebral artery (HCC)   Hypertension   Right sided weakness   Acute on chronic congestive heart failure (HCC)   55 y.o.malewith medical history significant for chronic CHF, insulin-dependent diabetes mellitus, hypertension, depression, ulcerative colitis, prior CVA with minimal residual right-sided weakness, and obstructive sleep apnea who presents to the emergency department with dysarthria, confusion, lethargy, and right facial droop. Medical admission for further evaluation and management of acute CVA was advised. Patient will be admitted to the telemetry unit for ongoing evaluation and management of acute CVA.He did not receive IV t-PA due to being outside the window  MRI brain shows Acute small to moderate LEFT frontal lobe/ MCA territory infarct corresponding to perfusion abnormality.  Assessment and plan 1. Stroke:  Dominant left frontal/MCA territory infarct embolic pattern secondary to unknown source. Patient does have multiple stroke risk factors, however, cardiac embolic infarct cannot be ruled out.  Resultant  expressive aphasia, right hemiparesis  CTA head and neck L M3 occlusion, R pleural effusion, pulmonary edema.  CTP small to moderate size L MCA territory infarct  MRI  Small to moderate L frontal/MCA territory infarct. Old B BG, pontomedullary, R occipital/PCA infarcts.  2D Echo LV EF:  35%.Diffuse hypokinesis-cardiology consulted for further recommendations for CHF, scheduled for TEE with Loop recorder 1/5  LE doppler no DVT  LDL 94  HgbA1c 6.4     Lovenox 40 mg sq daily for VTE prophylaxis  Diet Diet recommendations: Thin liquid;Dysphagia 1 (puree  No antithrombotic prior to admission, now on aspirin 300 mg suppository daily. Due to intracranial stenosis, neurology recommends DAPT with aspirin and Plavix for 3 months and then Plavix alone.  Therapy recommendations:   CIR  Disposition:   CIR Monday   2. Acute on chronic systolic/diastolic CHF  - Initially found to have BLE edema, JVD, crackles, BNP 3370, CXR with interstitial edema  - TTE (04/13/15) with EF 50-55%, grade 2 diastolic dysfunction, severe concentric hypertrophy, moderate-severe TR, mild AR, and moderate pulmonary HTN  - He has Lasix, Bumex, and Zaroxolyn, Aldactone, Coreg, and lisinopril on home med list EF down to 35% from normal EF by echo done at [facility] in 04/2015. Given IV lasix 80mg  BID x1 day then transitioned to PO Lasix 60mg  BID. Net negative 2.3L. would continue with IV diuresis given elevated filling pressures on ECHO and marked LE edema.  Continue Lasix 80 mg IV twice a day, creatinine is stable Resume Coreg repeat echocardiogram likely in 2-3 months. If ejection fraction still remains decreased and he has recovered from his aphasia,  consider diagnostic heart catheterization at that time.  3. Hypertension with hypertensive urgency - BP 200/140 in ED, likely exacerbated by acute CHF and acute cerebral artery occlusion  - Given the acute ischemic CVA, will treat only for SBP >220 or DBP >110 for now in acute phase  Resumed Coreg  4. Insulin-dependent DM  - A1c was 5.9%  in 2016. Accu-Chek stable - Reduced dose of Lantus as CBG somewhat low this morning      5. Hypokalemia - Serum potassium 3.3 on admission , replete - Monitor on telemetry  - Follow BMP qAM during diuresis    6.  Normocytic anemia  - Hgb 12.1 on admission  - Stable relative to priors with no sign of bleeding   7. OSA - CPAP qHS     DVT prophylaxsis Lovenox  Code Status:  Full code    Family Communication: Discussed in detail with the patient/brother, all imaging results, lab results explained to the patient   Disposition Plan:  Continue IV diuresis, CIR likely Monday     Consultants:  Neurology  Cardiology  Procedures:  None  Antibiotics: Anti-infectives    None         HPI/Subjective: Patient is awake, follows commands  Objective: Vitals:   02/07/16 2103 02/08/16 0014 02/08/16 0319 02/08/16 0545  BP: (!) 140/92  (!) 172/107 (!) 173/100  Pulse: 91 88 80 84  Resp: 18 18 18 20   Temp: 98.1 F (36.7 C)  98.2 F (36.8 C) 98.7 F (37.1 C)  TempSrc: Oral  Oral Oral  SpO2: 100% 99% 96% 100%  Weight:   103 kg (227 lb)   Height:        Intake/Output Summary (Last 24 hours) at 02/08/16 0939 Last data filed at 02/08/16 0329  Gross per 24 hour  Intake                0 ml  Output             2100 ml  Net            -2100 ml    Exam:  Examination:  General exam: Appears calm and comfortable  Respiratory system: Clear to auscultation. Respiratory effort normal. Cardiovascular system: S1 & S2 heard, RRR. No JVD, murmurs, rubs, gallops or clicks. No pedal edema. Gastrointestinal system: Abdomen is nondistended, soft and nontender. No organomegaly or masses felt. Normal bowel sounds heard. Central nervous system: sleepy drowsy, but arousable and following some commands. Still aphasic with right-sided weakness      Data Reviewed: I have personally reviewed following labs and imaging studies  Micro Results Recent Results (from the past 240 hour(s))  Blood culture (routine x 2)     Status: None (Preliminary result)   Collection Time: 02/05/16  3:32 PM  Result Value Ref Range Status   Specimen Description BLOOD LEFT HAND AEROBIC BOTTLE ONLY  Final   Special  Requests 5CC  Final   Culture NO GROWTH 2 DAYS  Final   Report Status PENDING  Incomplete  Blood culture (routine x 2)     Status: None (Preliminary result)   Collection Time: 02/05/16  3:37 PM  Result Value Ref Range Status   Specimen Description RIGHT ANTECUBITAL AEROBIC BOTTLE ONLY  Final   Special Requests 5CC  Final   Culture NO GROWTH 2 DAYS  Final   Report Status PENDING  Incomplete  Urine culture     Status: None   Collection Time: 02/05/16  4:24 PM  Result Value Ref Range Status   Specimen Description URINE, CATHETERIZED  Final   Special Requests NONE  Final   Culture NO GROWTH  Final   Report Status 02/06/2016 FINAL  Final    Radiology Reports Ct Angio Head W Or Wo Contrast  Result Date: 02/05/2016 CLINICAL DATA:  Initial evaluation for acute right-sided weakness,  slurred speech, aphasia. EXAM: CT ANGIOGRAPHY HEAD AND NECK TECHNIQUE: Multidetector CT imaging of the head and neck was performed using the standard protocol during bolus administration of intravenous contrast. Multiplanar CT image reconstructions and MIPs were obtained to evaluate the vascular anatomy. Carotid stenosis measurements (when applicable) are obtained utilizing NASCET criteria, using the distal internal carotid diameter as the denominator. CONTRAST:  90 cc of Isovue 370. COMPARISON:  Prior CT from earlier the same day. FINDINGS: CT HEAD FINDINGS Brain: Atrophy with chronic microvascular ischemic disease again noted. Remote lacunar infarcts within the bilateral basal ganglia. Now evident is subtle evolving hypodensity involving the left insular cortex extending towards the left frontal operculum (series 201, image 18). This is more evident as compared to previous examination performed earlier the same day. No significant mass effect. No acute intracranial hemorrhage. Hyperdensity at the level of the left sylvian fissure suspicious for thrombus (series 201, image 19). No mass lesion, midline shift, or mass effect.  No hydrocephalus. No extra-axial fluid collection. Vascular: Vascular calcifications within the carotid siphons. Hyperdensity at the level the left sylvian fissure suspicious for thrombus (series 201, image 19). No other hyperdense vessel. Skull: Scalp soft tissues within normal limits.  Calvarium intact. Sinuses: Globes and orbital soft tissues within normal limits. Patient is status post scleral banding on the left. Mild opacity within the right ethmoidal air cells. Paranasal sinuses are otherwise clear. No mastoid effusion. CTA NECK FINDINGS Aortic arch: Visualized aortic arch of normal caliber with normal branch pattern. Scattered atheromatous plaque within the arch itself and about the origin of the great vessels without flow-limiting stenosis. Visualized subclavian arteries are widely patent. Right carotid system: Right common carotid artery patent from its origin to the bifurcation. Scattered calcified plaque about the right bifurcation without flow-limiting stenosis. Right ICA patent distally to the skullbase without stenosis, dissection, or occlusion. Left carotid system: Left common carotid artery patent from its origin to the bifurcation. Scattered calcified plaque about the left bifurcation without flow-limiting stenosis. Left ICA patent distally to the skullbase without stenosis, dissection, or occlusion. Vertebral arteries: Both of the vertebral arteries arise from the subclavian arteries. The the vertebral arteries widely patent without stenosis, dissection, or occlusion. Focal plaque noted at the origin of the right vertebral artery without high-grade stenosis. Skeleton: No acute osseous abnormality. No worrisome lytic or blastic osseous lesions. Other neck: Visualized soft tissues of the neck demonstrate no acute abnormality. Upper chest: Visualized upper mediastinum within normal limits. Layering right pleural effusion. Probable smaller left effusion. Scattered interlobular septal thickening within  the partially visualized lungs, suggesting edema. Review of the MIP images confirms the above findings CTA HEAD FINDINGS Anterior circulation: Petrous segments patent bilaterally. Scattered calcified atheromatous plaque within the cavernous/ supraclinoid ICAs with mild to moderate multifocal narrowing. ICA termini patent. Left A1 segment dominant and widely patent. Right A1 segment hypoplastic but patent as well. Anterior communicating artery normal. Anterior cerebral arteries patent to their distal aspects. Right M1 segment patent without stenosis or occlusion. Right MCA bifurcation normal. Right MCA branches well opacified to their distal aspects. Left M1 segment patent without stenosis or occlusion. Left MCA bifurcation normal. No proximal M2 branch occlusion. There is abrupt occlusion of a left M3 branch, middle division (series 602, image 80). This corresponds with previously seen hyperdensity on noncontrast portion of this exam. No other arterial branch occlusion. Left MCA branches otherwise patent. Posterior circulation: Scattered plaque within the V4 segments bilaterally, right greater than left with mild multifocal narrowing. Posterior inferior  cerebral arteries patent bilaterally. Basilar artery widely patent. Superior cerebellar arteries patent bilaterally. Both of the posterior cerebral arteries supplied mainly via the basilar artery and are patent to their distal aspects. Venous sinuses: Patent. Anatomic variants: No significant anatomic variant. No aneurysm or vascular malformation. Delayed phase: Not performed. Review of the MIP images confirms the above findings IMPRESSION: 1. 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 2. Scattered atheromatous plaque throughout the carotid siphons with mild to moderate multifocal narrowing. 3. Atheromatous plaque about the carotid bifurcations bilaterally without flow-limiting stenosis. 4. Large  layering right pleural effusion, with smaller left pleural effusion. Interlobular septal thickening suggestive of pulmonary edema. Critical Value/emergent results were called by telephone at the time of interpretation on 02/05/2016 at 6:19 pm to Dr. Ritta Slot , who verbally acknowledged these results. Electronically Signed   By: Rise Mu M.D.   On: 02/05/2016 19:04   Ct Head Wo Contrast  Result Date: 02/05/2016 CLINICAL DATA:  Altered mental status, aphasia, right-sided weakness EXAM: CT HEAD WITHOUT CONTRAST TECHNIQUE: Contiguous axial images were obtained from the base of the skull through the vertex without intravenous contrast. COMPARISON:  MRI 05/19/2000 FINDINGS: Brain: Old lacunar infarct in the left basal ganglia. Old infarct and encephalomalacia in the right occipital lobe. No acute intracranial abnormality. Specifically, no hemorrhage, hydrocephalus, mass lesion, acute infarction, or significant intracranial injury. Vascular: No hyperdense vessel or unexpected calcification. Skull: No acute calvarial abnormality. Sinuses/Orbits: Visualized paranasal sinuses and mastoids clear. Orbital soft tissues unremarkable. Other: None IMPRESSION: Old left basal ganglia and right occipital infarct. No acute intracranial abnormality. Electronically Signed   By: Charlett Nose M.D.   On: 02/05/2016 15:25   Ct Angio Neck W Or Wo Contrast  Result Date: 02/05/2016 CLINICAL DATA:  Initial evaluation for acute right-sided weakness, slurred speech, aphasia. EXAM: CT ANGIOGRAPHY HEAD AND NECK TECHNIQUE: Multidetector CT imaging of the head and neck was performed using the standard protocol during bolus administration of intravenous contrast. Multiplanar CT image reconstructions and MIPs were obtained to evaluate the vascular anatomy. Carotid stenosis measurements (when applicable) are obtained utilizing NASCET criteria, using the distal internal carotid diameter as the denominator. CONTRAST:  90 cc of  Isovue 370. COMPARISON:  Prior CT from earlier the same day. FINDINGS: CT HEAD FINDINGS Brain: Atrophy with chronic microvascular ischemic disease again noted. Remote lacunar infarcts within the bilateral basal ganglia. Now evident is subtle evolving hypodensity involving the left insular cortex extending towards the left frontal operculum (series 201, image 18). This is more evident as compared to previous examination performed earlier the same day. No significant mass effect. No acute intracranial hemorrhage. Hyperdensity at the level of the left sylvian fissure suspicious for thrombus (series 201, image 19). No mass lesion, midline shift, or mass effect. No hydrocephalus. No extra-axial fluid collection. Vascular: Vascular calcifications within the carotid siphons. Hyperdensity at the level the left sylvian fissure suspicious for thrombus (series 201, image 19). No other hyperdense vessel. Skull: Scalp soft tissues within normal limits.  Calvarium intact. Sinuses: Globes and orbital soft tissues within normal limits. Patient is status post scleral banding on the left. Mild opacity within the right ethmoidal air cells. Paranasal sinuses are otherwise clear. No mastoid effusion. CTA NECK FINDINGS Aortic arch: Visualized aortic arch of normal caliber with normal branch pattern. Scattered atheromatous plaque within the arch itself and about the origin of the great vessels without flow-limiting stenosis. Visualized subclavian arteries are widely  patent. Right carotid system: Right common carotid artery patent from its origin to the bifurcation. Scattered calcified plaque about the right bifurcation without flow-limiting stenosis. Right ICA patent distally to the skullbase without stenosis, dissection, or occlusion. Left carotid system: Left common carotid artery patent from its origin to the bifurcation. Scattered calcified plaque about the left bifurcation without flow-limiting stenosis. Left ICA patent distally to  the skullbase without stenosis, dissection, or occlusion. Vertebral arteries: Both of the vertebral arteries arise from the subclavian arteries. The the vertebral arteries widely patent without stenosis, dissection, or occlusion. Focal plaque noted at the origin of the right vertebral artery without high-grade stenosis. Skeleton: No acute osseous abnormality. No worrisome lytic or blastic osseous lesions. Other neck: Visualized soft tissues of the neck demonstrate no acute abnormality. Upper chest: Visualized upper mediastinum within normal limits. Layering right pleural effusion. Probable smaller left effusion. Scattered interlobular septal thickening within the partially visualized lungs, suggesting edema. Review of the MIP images confirms the above findings CTA HEAD FINDINGS Anterior circulation: Petrous segments patent bilaterally. Scattered calcified atheromatous plaque within the cavernous/ supraclinoid ICAs with mild to moderate multifocal narrowing. ICA termini patent. Left A1 segment dominant and widely patent. Right A1 segment hypoplastic but patent as well. Anterior communicating artery normal. Anterior cerebral arteries patent to their distal aspects. Right M1 segment patent without stenosis or occlusion. Right MCA bifurcation normal. Right MCA branches well opacified to their distal aspects. Left M1 segment patent without stenosis or occlusion. Left MCA bifurcation normal. No proximal M2 branch occlusion. There is abrupt occlusion of a left M3 branch, middle division (series 602, image 80). This corresponds with previously seen hyperdensity on noncontrast portion of this exam. No other arterial branch occlusion. Left MCA branches otherwise patent. Posterior circulation: Scattered plaque within the V4 segments bilaterally, right greater than left with mild multifocal narrowing. Posterior inferior cerebral arteries patent bilaterally. Basilar artery widely patent. Superior cerebellar arteries patent  bilaterally. Both of the posterior cerebral arteries supplied mainly via the basilar artery and are patent to their distal aspects. Venous sinuses: Patent. Anatomic variants: No significant anatomic variant. No aneurysm or vascular malformation. Delayed phase: Not performed. Review of the MIP images confirms the above findings IMPRESSION: 1. 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 2. Scattered atheromatous plaque throughout the carotid siphons with mild to moderate multifocal narrowing. 3. Atheromatous plaque about the carotid bifurcations bilaterally without flow-limiting stenosis. 4. Large layering right pleural effusion, with smaller left pleural effusion. Interlobular septal thickening suggestive of pulmonary edema. Critical Value/emergent results were called by telephone at the time of interpretation on 02/05/2016 at 6:19 pm to Dr. Ritta Slot , who verbally acknowledged these results. Electronically Signed   By: Rise Mu M.D.   On: 02/05/2016 19:04   Mr Brain Wo Contrast  Result Date: 02/06/2016 CLINICAL DATA:  Acute on chronic RIGHT-sided weakness, aphasia. Follow-up M3 occlusion. Assess stroke versus PRES. History of hypertension and diabetes. EXAM: MRI HEAD WITHOUT CONTRAST TECHNIQUE: Multiplanar, multiecho pulse sequences of the brain and surrounding structures were obtained without intravenous contrast. COMPARISON:  CT HEAD February 05, 2016 and MRI of the head May 20, 2015 FINDINGS: BRAIN: Confluent reduced diffusion measuring up to 5.8 cm in LEFT frontal lobe, involving the insula and operculum with low ADC values. Non contiguous patchy reduced diffusion LEFT anterior frontal lobe. No susceptibility artifact to suggest acute hemorrhage. The ventricles and sulci are normal for patient's age. Small area  RIGHT occipital lobe encephalomalacia. Old LEFT pontomedullary infarcts. Old bilateral basal ganglia lacunar  infarcts. Symmetric basal ganglia susceptibility artifact most consistent with old hemorrhage. Patchy supratentorial and pontine white matter FLAIR T2 hyperintensities. Mild ventriculomegaly on the basis of global parenchymal brain volume loss. No intraparenchymal mass effect, masses. No abnormal extra-axial fluid collections. VASCULAR: Normal major intracranial vascular flow voids present at skull base. SKULL AND UPPER CERVICAL SPINE: Empty sella. No suspicious calvarial bone marrow signal. Craniocervical junction maintained. SINUSES/ORBITS: Mild paranasal sinus mucosal thickening. The included ocular globes and orbital contents are non-suspicious. Status post LEFT scleral banding and ocular lens implant. OTHER: None. IMPRESSION: Acute small to moderate LEFT frontal lobe/ MCA territory infarct corresponding to perfusion abnormality. 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 Old basal ganglia and pontomedullary lacunar infarcts. Old RIGHT occipital lobe/ PCA territory infarct. Mild to moderate chronic small vessel ischemic disease. Mild atrophy. Electronically Signed   By: Awilda Metro M.D.   On: 02/06/2016 06:01   Ct Cerebral Perfusion W Contrast  Result Date: 02/05/2016 CLINICAL DATA:  Initial evaluation for acute right-sided weakness. EXAM: CT PERFUSION BRAIN TECHNIQUE: Multiphase CT imaging of the brain was performed following IV bolus contrast injection. Subsequent parametric perfusion maps were calculated using RAPID software. CONTRAST:  90 cc of Isovue 370. FINDINGS: CT Brain Perfusion Findings: CBF (<30%) Volume: 21 ccmL Perfusion (Tmax>6.0s) volume: 92 ccmL Mismatch Volume: 71 ccmL Infarction Location:Core infarct primarily involves the left frontal operculum/anterior left frontal lobe, with some extension towards the left insula and left temporal lobe. Please note that while the mismatch volume is measured as 71 cc of this exam, there is felt to be some artifact on the T max perfusion  images, with elevated T-max seen not only around the core infarct, but also within the right cerebral hemisphere as well as the right cerebellar hemisphere. Overall, while there is likely some penumbra about the area of core infarction, the recorded 71 cc is likely not accurate as to the volume of actual mismatch (which is likely significantly less, although difficult to accurately quantify on this exam). IMPRESSION: Acute small to moderate-sized left MCA territory infarct as above. Critical Value/emergent results were called by telephone at the time of interpretation on 02/05/2016 at 6:19 pm to Dr. Ritta Slot , who verbally acknowledged these results. Electronically Signed   By: Rise Mu M.D.   On: 02/05/2016 18:27   Dg Chest Port 1 View  Result Date: 02/05/2016 CLINICAL DATA:  Acute altered mental status. EXAM: PORTABLE CHEST 1 VIEW COMPARISON:  07/26/2015 FINDINGS: The patient has developed cardiomegaly with pulmonary vascular congestion and slight bilateral interstitial pulmonary edema. No acute bone abnormality. IMPRESSION: New cardiomegaly with pulmonary vascular congestion and slight bilateral interstitial pulmonary edema. Electronically Signed   By: Francene Boyers M.D.   On: 02/05/2016 16:01   Dg Swallowing Func-speech Pathology  Result Date: 02/06/2016 Objective Swallowing Evaluation: Type of Study: MBS-Modified Barium Swallow Study Patient Details Name: [name] MRN: [id number] Date of Birth: October 15, 1960 Today's Date: 02/06/2016 Time: SLP Start Time (ACUTE ONLY): 1017-SLP Stop Time (ACUTE ONLY): 1033 SLP Time Calculation (min) (ACUTE ONLY): 16 min Past Medical History: Past Medical History: Diagnosis Date . BPH (benign prostatic hyperplasia)  . CHF (congestive heart failure) (HCC)  . Diabetes mellitus without complication (HCC)  . Diabetic retinopathy associated with type 2 diabetes mellitus (HCC)  . Hypertension  . Lymphedema  . OSA (obstructive sleep apnea)  . TIA (transient  ischemic attack)  Past Surgical History: Past Surgical History:  Procedure Laterality Date . BLADDER SURGERY   . CARDIAC CATHETERIZATION   HPI: [name] a 56 y.o.malewith medical history significant for chronic CHF, insulin-dependent diabetes mellitus, hypertension, depression, OSA, ulcerative colitis, prior CVA with minimal residual right-sided weakness who presents to the emergency department with dysarthria, confusion, lethargy, and right facial droop. Patient lives alone and family resides in [facility], [place] and [place]. Chest x-ray is notable for new cardiomegaly with pulmonary vascular congestion and slight bilateral interstitial edema. MRI acute small to moderate LEFT frontal lobe/ MCA territory infarct corresponding to perfusion abnormality. 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 Old basal ganglia. MBS recommended following BSE. No Data Recorded Assessment / Plan / Recommendation CHL IP CLINICAL IMPRESSIONS 02/06/2016 Therapy Diagnosis Moderate oral phase dysphagia;Mild pharyngeal phase dysphagia Clinical Impression Labial spillage, delayed transit and intermittent piecemeal pattern describe pt's oral function. Swallow initiation delayed to pyriform sinuses due to decreased sensation without penetration or aspiration with consecutive cup and straw sips and minimal vallecular residue. Upon clinical observation during bedside swallow and following MBS, pt has multiple swallows and intermittent mild throat clears with the appearance of difficulty however pt safe with consecutive larger sips. Risk for pocketing present therefore recommend Dys 2 texture and thin liquids, crush meds, straws allowed, sit upright and check right side oral cavity for pocketing. ST will continue to follow.     Impact on safety and function Mild aspiration risk   CHL IP TREATMENT RECOMMENDATION 02/06/2016 Treatment Recommendations Therapy as outlined in treatment plan below   Prognosis 02/06/2016 Prognosis for Safe  Diet Advancement Good Barriers to Reach Goals -- Barriers/Prognosis Comment -- CHL IP DIET RECOMMENDATION 02/06/2016 SLP Diet Recommendations Dysphagia 2 (Fine chop) solids;Thin liquid Liquid Administration via Straw;Cup Medication Administration Crushed with puree Compensations Slow rate;Small sips/bites;Minimize environmental distractions;Lingual sweep for clearance of pocketing Postural Changes Seated upright at 90 degrees   CHL IP OTHER RECOMMENDATIONS 02/06/2016 Recommended Consults -- Oral Care Recommendations Oral care BID Other Recommendations --   CHL IP FOLLOW UP RECOMMENDATIONS 02/06/2016 Follow up Recommendations Inpatient Rehab   CHL IP FREQUENCY AND DURATION 02/06/2016 Speech Therapy Frequency (ACUTE ONLY) min 2x/week Treatment Duration 2 weeks      CHL IP ORAL PHASE 02/06/2016 Oral Phase Impaired Oral - Pudding Teaspoon -- Oral - Pudding Cup -- Oral - Honey Teaspoon -- Oral - Honey Cup -- Oral - Nectar Teaspoon -- Oral - Nectar Cup -- Oral - Nectar Straw -- Oral - Thin Teaspoon -- Oral - Thin Cup Delayed oral transit;Piecemeal swallowing Oral - Thin Straw Delayed oral transit;Piecemeal swallowing Oral - Puree -- Oral - Mech Soft -- Oral - Regular Delayed oral transit;Weak lingual manipulation Oral - Multi-Consistency -- Oral - Pill -- Oral Phase - Comment --  CHL IP PHARYNGEAL PHASE 02/06/2016 Pharyngeal Phase Impaired Pharyngeal- Pudding Teaspoon -- Pharyngeal -- Pharyngeal- Pudding Cup -- Pharyngeal -- Pharyngeal- Honey Teaspoon -- Pharyngeal -- Pharyngeal- Honey Cup -- Pharyngeal -- Pharyngeal- Nectar Teaspoon -- Pharyngeal -- Pharyngeal- Nectar Cup Delayed swallow initiation-pyriform sinuses Pharyngeal -- Pharyngeal- Nectar Straw -- Pharyngeal -- Pharyngeal- Thin Teaspoon -- Pharyngeal -- Pharyngeal- Thin Cup Delayed swallow initiation-pyriform sinuses;Pharyngeal residue - valleculae Pharyngeal -- Pharyngeal- Thin Straw Delayed swallow initiation-pyriform sinuses;Pharyngeal residue - valleculae Pharyngeal --  Pharyngeal- Puree -- Pharyngeal -- Pharyngeal- Mechanical Soft -- Pharyngeal -- Pharyngeal- Regular WFL Pharyngeal -- Pharyngeal- Multi-consistency -- Pharyngeal -- Pharyngeal- Pill -- Pharyngeal -- Pharyngeal Comment --  CHL IP CERVICAL ESOPHAGEAL PHASE 02/06/2016 Cervical Esophageal Phase WFL Pudding Teaspoon -- Pudding Cup --  Honey Teaspoon -- Honey Cup -- Nectar Teaspoon -- Nectar Cup -- Nectar Straw -- Thin Teaspoon -- Thin Cup -- Thin Straw -- Puree -- Mechanical Soft -- Regular -- Multi-consistency -- Pill -- Cervical Esophageal Comment -- No flowsheet data found. Royce Macadamia 02/06/2016, 11:14 AM Breck Coons Lonell Face.Ed CCC-SLP Pager [phone]                CBC  Recent Labs Lab 02/05/16 1531 02/05/16 1532 02/08/16 0710  WBC  --  6.3 6.2  HGB 12.6* 12.1* 11.4*  HCT 37.0* 37.0* 34.8*  PLT  --  230 219  MCV  --  87.7 87.0  MCH  --  28.7 28.5  MCHC  --  32.7 32.8  RDW  --  16.1* 15.7*  LYMPHSABS  --  1.0  --   MONOABS  --  0.3  --   EOSABS  --  0.1  --   BASOSABS  --  0.0  --     Chemistries   Recent Labs Lab 02/05/16 1531 02/05/16 1532 02/06/16 0429 02/07/16 0730 02/08/16 0710  NA 143 143 145 143 143  K 3.6 3.3* 3.8 3.4* 3.5  CL 107 111 110 110 107  CO2  --  25 24 28 28   GLUCOSE 104* 105* 112* 70 86  BUN 33* 24* 23* 21* 17  CREATININE 1.50* 1.48* 1.45* 1.58* 1.38*  CALCIUM  --  9.1 8.9 8.7* 8.7*  MG  --   --  1.9  --   --   AST  --  42*  --   --  27  ALT  --  27  --   --  18  ALKPHOS  --  227*  --   --  181*  BILITOT  --  0.9  --   --  0.6   ------------------------------------------------------------------------------------------------------------------ estimated creatinine clearance is 74.8 mL/min (by C-G formula based on SCr of 1.38 mg/dL (H)). ------------------------------------------------------------------------------------------------------------------  Recent Labs  02/05/16 2200  HGBA1C 6.4*    ------------------------------------------------------------------------------------------------------------------  Recent Labs  02/06/16 0429  CHOL 157  HDL 47  LDLCALC 94  TRIG 80  CHOLHDL 3.3   ------------------------------------------------------------------------------------------------------------------ No results for input(s): TSH, T4TOTAL, T3FREE, THYROIDAB in the last 72 hours.  Invalid input(s): FREET3 ------------------------------------------------------------------------------------------------------------------ No results for input(s): VITAMINB12, FOLATE, FERRITIN, TIBC, IRON, RETICCTPCT in the last 72 hours.  Coagulation profile  Recent Labs Lab 02/05/16 1532  INR 1.18    No results for input(s): DDIMER in the last 72 hours.  Cardiac Enzymes No results for input(s): CKMB, TROPONINI, MYOGLOBIN in the last 168 hours.  Invalid input(s): CK ------------------------------------------------------------------------------------------------------------------ Invalid input(s): POCBNP   CBG:  Recent Labs Lab 02/07/16 2024 02/08/16 0055 02/08/16 0201 02/08/16 0347 02/08/16 0840  GLUCAP 125* 66 94 92 81       Studies: Dg Swallowing Func-speech Pathology  Result Date: 02/06/2016 Objective Swallowing Evaluation: Type of Study: MBS-Modified Barium Swallow Study Patient Details Name: [name] MRN: [id number] Date of Birth: 01-Apr-1960 Today's Date: 02/06/2016 Time: SLP Start Time (ACUTE ONLY): 1017-SLP Stop Time (ACUTE ONLY): 1033 SLP Time Calculation (min) (ACUTE ONLY): 16 min Past Medical History: Past Medical History: Diagnosis Date . BPH (benign prostatic hyperplasia)  . CHF (congestive heart failure) (HCC)  . Diabetes mellitus without complication (HCC)  . Diabetic retinopathy associated with type 2 diabetes mellitus (HCC)  . Hypertension  . Lymphedema  . OSA (obstructive sleep apnea)  . TIA (transient ischemic attack)  Past Surgical History: Past  Surgical  History: Procedure Laterality Date . BLADDER SURGERY   . CARDIAC CATHETERIZATION   HPI: [name] a 56 y.o.malewith medical history significant for chronic CHF, insulin-dependent diabetes mellitus, hypertension, depression, OSA, ulcerative colitis, prior CVA with minimal residual right-sided weakness who presents to the emergency department with dysarthria, confusion, lethargy, and right facial droop. Patient lives alone and family resides in [facility], [place] and [place]. Chest x-ray is notable for new cardiomegaly with pulmonary vascular congestion and slight bilateral interstitial edema. MRI acute small to moderate LEFT frontal lobe/ MCA territory infarct corresponding to perfusion abnormality. 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 Old basal ganglia. MBS recommended following BSE. No Data Recorded Assessment / Plan / Recommendation CHL IP CLINICAL IMPRESSIONS 02/06/2016 Therapy Diagnosis Moderate oral phase dysphagia;Mild pharyngeal phase dysphagia Clinical Impression Labial spillage, delayed transit and intermittent piecemeal pattern describe pt's oral function. Swallow initiation delayed to pyriform sinuses due to decreased sensation without penetration or aspiration with consecutive cup and straw sips and minimal vallecular residue. Upon clinical observation during bedside swallow and following MBS, pt has multiple swallows and intermittent mild throat clears with the appearance of difficulty however pt safe with consecutive larger sips. Risk for pocketing present therefore recommend Dys 2 texture and thin liquids, crush meds, straws allowed, sit upright and check right side oral cavity for pocketing. ST will continue to follow.     Impact on safety and function Mild aspiration risk   CHL IP TREATMENT RECOMMENDATION 02/06/2016 Treatment Recommendations Therapy as outlined in treatment plan below   Prognosis 02/06/2016 Prognosis for Safe Diet Advancement Good Barriers to Reach Goals --  Barriers/Prognosis Comment -- CHL IP DIET RECOMMENDATION 02/06/2016 SLP Diet Recommendations Dysphagia 2 (Fine chop) solids;Thin liquid Liquid Administration via Straw;Cup Medication Administration Crushed with puree Compensations Slow rate;Small sips/bites;Minimize environmental distractions;Lingual sweep for clearance of pocketing Postural Changes Seated upright at 90 degrees   CHL IP OTHER RECOMMENDATIONS 02/06/2016 Recommended Consults -- Oral Care Recommendations Oral care BID Other Recommendations --   CHL IP FOLLOW UP RECOMMENDATIONS 02/06/2016 Follow up Recommendations Inpatient Rehab   CHL IP FREQUENCY AND DURATION 02/06/2016 Speech Therapy Frequency (ACUTE ONLY) min 2x/week Treatment Duration 2 weeks      CHL IP ORAL PHASE 02/06/2016 Oral Phase Impaired Oral - Pudding Teaspoon -- Oral - Pudding Cup -- Oral - Honey Teaspoon -- Oral - Honey Cup -- Oral - Nectar Teaspoon -- Oral - Nectar Cup -- Oral - Nectar Straw -- Oral - Thin Teaspoon -- Oral - Thin Cup Delayed oral transit;Piecemeal swallowing Oral - Thin Straw Delayed oral transit;Piecemeal swallowing Oral - Puree -- Oral - Mech Soft -- Oral - Regular Delayed oral transit;Weak lingual manipulation Oral - Multi-Consistency -- Oral - Pill -- Oral Phase - Comment --  CHL IP PHARYNGEAL PHASE 02/06/2016 Pharyngeal Phase Impaired Pharyngeal- Pudding Teaspoon -- Pharyngeal -- Pharyngeal- Pudding Cup -- Pharyngeal -- Pharyngeal- Honey Teaspoon -- Pharyngeal -- Pharyngeal- Honey Cup -- Pharyngeal -- Pharyngeal- Nectar Teaspoon -- Pharyngeal -- Pharyngeal- Nectar Cup Delayed swallow initiation-pyriform sinuses Pharyngeal -- Pharyngeal- Nectar Straw -- Pharyngeal -- Pharyngeal- Thin Teaspoon -- Pharyngeal -- Pharyngeal- Thin Cup Delayed swallow initiation-pyriform sinuses;Pharyngeal residue - valleculae Pharyngeal -- Pharyngeal- Thin Straw Delayed swallow initiation-pyriform sinuses;Pharyngeal residue - valleculae Pharyngeal -- Pharyngeal- Puree -- Pharyngeal -- Pharyngeal-  Mechanical Soft -- Pharyngeal -- Pharyngeal- Regular WFL Pharyngeal -- Pharyngeal- Multi-consistency -- Pharyngeal -- Pharyngeal- Pill -- Pharyngeal -- Pharyngeal Comment --  CHL IP CERVICAL ESOPHAGEAL PHASE 02/06/2016 Cervical Esophageal Phase WFL Pudding Teaspoon -- Pudding Cup --  Honey Teaspoon -- Honey Cup -- Nectar Teaspoon -- Nectar Cup -- Nectar Straw -- Thin Teaspoon -- Thin Cup -- Thin Straw -- Puree -- Mechanical Soft -- Regular -- Multi-consistency -- Pill -- Cervical Esophageal Comment -- No flowsheet data found. Royce Macadamia 02/06/2016, 11:14 AM Breck Coons Lonell Face.Ed CCC-SLP Pager [phone]                 Lab Results  Component Value Date   HGBA1C 6.4 (H) 02/05/2016   Lab Results  Component Value Date   LDLCALC 94 02/06/2016   CREATININE 1.38 (H) 02/08/2016       Scheduled Meds: . aspirin EC  325 mg Oral Daily   Or  . aspirin  300 mg Rectal Daily  . atorvastatin  20 mg Oral QHS  . calcium-vitamin D  1 tablet Oral Daily  . carvedilol  50 mg Oral BID  . citalopram  40 mg Oral Daily  . clopidogrel  75 mg Oral Daily  . doxazosin  4 mg Oral QHS  . enoxaparin (LOVENOX) injection  40 mg Subcutaneous Q24H  . furosemide  80 mg Intravenous BID  . insulin aspart  0-9 Units Subcutaneous Q4H  . insulin glargine  5 Units Subcutaneous QHS  . isosorbide mononitrate  30 mg Oral Daily  . magnesium oxide  400 mg Oral BID  . multivitamin with minerals  1 tablet Oral Daily  . potassium chloride  40 mEq Oral Daily  . sodium chloride flush  3 mL Intravenous Q12H  . sulfaSALAzine  2,000 mg Oral BID  . tamsulosin  0.4 mg Oral QHS   Continuous Infusions:   LOS: 3 days    Time spent: >30 MINS    [facility]  [facility] Pager [phone]. If 7PM-7AM, please contact night-coverage at [email or web address], password [email or web address] 02/08/2016, 9:39 AM  LOS: 3 days

## 2016-02-09 DIAGNOSIS — I5023 Acute on chronic systolic (congestive) heart failure: Secondary | ICD-10-CM

## 2016-02-09 DIAGNOSIS — I160 Hypertensive urgency: Secondary | ICD-10-CM

## 2016-02-09 LAB — BASIC METABOLIC PANEL
Anion gap: 8 (ref 5–15)
BUN: 18 mg/dL (ref 6–20)
CALCIUM: 8.8 mg/dL — AB (ref 8.9–10.3)
CHLORIDE: 107 mmol/L (ref 101–111)
CO2: 28 mmol/L (ref 22–32)
CREATININE: 1.43 mg/dL — AB (ref 0.61–1.24)
GFR calc non Af Amer: 54 mL/min — ABNORMAL LOW (ref >60)
Glucose, Bld: 84 mg/dL (ref 65–99)
Potassium: 3.6 mmol/L (ref 3.5–5.1)
Sodium: 143 mmol/L (ref 135–145)

## 2016-02-09 LAB — GLUCOSE, CAPILLARY
GLUCOSE-CAPILLARY: 88 mg/dL (ref 65–99)
GLUCOSE-CAPILLARY: 75 mg/dL (ref 65–99)
GLUCOSE-CAPILLARY: 80 mg/dL (ref 65–99)
Glucose-Capillary: 84 mg/dL (ref 65–99)
Glucose-Capillary: 97 mg/dL (ref 65–99)

## 2016-02-09 MED ORDER — PANTOPRAZOLE SODIUM 40 MG PO TBEC
40.0000 mg | DELAYED_RELEASE_TABLET | Freq: Every day | ORAL | Status: DC
  Administered 2016-02-10 – 2016-02-11 (×2): 40 mg via ORAL
  Filled 2016-02-09 (×3): qty 1

## 2016-02-09 MED ORDER — LISINOPRIL 10 MG PO TABS: 10.0000 mg | ORAL_TABLET | Freq: Every day | ORAL | Status: DC

## 2016-02-09 NOTE — Progress Notes (Signed)
Patient Name: [name] Date of Encounter: 02/09/2016  Primary Cardiologist: Dr [facility] Problem List     Principal Problem:   [facility] Active Problems:   AKI (acute kidney injury) (HCC)   Essential hypertension   Acute on chronic diastolic CHF (congestive heart failure) (HCC)   Anemia of chronic disease   Diabetes mellitus, type II, insulin dependent (HCC)   OSA ? compliacne   Hypokalemia   Hypertensive urgency   Acute CVA (cerebrovascular accident) (HCC)   Cerebral infarction due to occlusion of left middle cerebral artery (HCC)   Hypertension   Right sided weakness   Acute on chronic congestive heart failure (HCC)     Subjective   Aphasic; shakes head no to CP or dyspnea.  Inpatient Medications    Scheduled Meds: . aspirin EC  325 mg Oral Daily   Or  . aspirin  300 mg Rectal Daily  . atorvastatin  20 mg Oral QHS  . calcium-vitamin D  1 tablet Oral Daily  . carvedilol  50 mg Oral BID  . citalopram  40 mg Oral Daily  . clopidogrel  75 mg Oral Daily  . doxazosin  4 mg Oral QHS  . enoxaparin (LOVENOX) injection  40 mg Subcutaneous Q24H  . furosemide  80 mg Intravenous BID  . insulin aspart  0-9 Units Subcutaneous Q4H  . insulin glargine  5 Units Subcutaneous QHS  . isosorbide mononitrate  30 mg Oral Daily  . lisinopril  2.5 mg Oral Daily  . magnesium oxide  400 mg Oral BID  . multivitamin with minerals  1 tablet Oral Daily  . potassium chloride  40 mEq Oral Daily  . sodium chloride flush  3 mL Intravenous Q12H  . sulfaSALAzine  2,000 mg Oral BID  . tamsulosin  0.4 mg Oral QHS   Continuous Infusions:  PRN Meds: sodium chloride, acetaminophen, docusate sodium, labetalol, ondansetron (ZOFRAN) IV, polyethylene glycol, sodium chloride flush   Vital Signs    Vitals:   02/08/16 2206 02/09/16 0206 02/09/16 0444 02/09/16 0520  BP: (!) 179/92 (!) 161/87  (!) 160/87  Pulse: 66 60  (!) 59  Resp: 18 20  20   Temp: 97.7 F (36.5 C)  97.7 F (36.5 C)  97.5 F (36.4 C)  TempSrc: Oral Oral  Oral  SpO2: 100% 97%  99%  Weight:   222 lb 8 oz (100.9 kg)   Height:        Intake/Output Summary (Last 24 hours) at 02/09/16 0913 Last data filed at 02/08/16 1200  Gross per 24 hour  Intake                0 ml  Output                0 ml  Net                0 ml   Filed Weights   02/07/16 0700 02/08/16 0319 02/09/16 0444  Weight: 229 lb 8 oz (104.1 kg) 227 lb (103 kg) 222 lb 8 oz (100.9 kg)    Physical Exam    GEN: Well nourished, well developed, in no acute distress.  HEENT: Grossly normal.  Neck: Supple Cardiac: RRR, 1+ lower ext edema Respiratory:  CTA anteriorly GI: Soft, nontender, nondistended. MS: no deformity or atrophy. Skin: warm and dry, no rash. Neuro:  Aphasic, right side weak   Labs    CBC  Recent Labs  02/08/16 0710  WBC  6.2  HGB 11.4*  HCT 34.8*  MCV 87.0  PLT 219   Basic Metabolic Panel  Recent Labs  02/08/16 0710 02/09/16 0724  NA 143 143  K 3.5 3.6  CL 107 107  CO2 28 28  GLUCOSE 86 84  BUN 17 18  CREATININE 1.38* 1.43*  CALCIUM 8.7* 8.8*   Liver Function Tests  Recent Labs  02/08/16 0710  AST 27  ALT 18  ALKPHOS 181*  BILITOT 0.6  PROT 5.1*  ALBUMIN 2.3*     Telemetry    Sinus with PVCs - Personally Reviewed  Patient Profile     56 yo male admitted with CVA; Echo showed newly reduced LV systolic function (EF 35); TEE showed no SOE; ILR placed. Also being treated for CHF and severe HTN.  Assessment & Plan    1 acute systolic congestive heart failure-patient remains volume overloaded. Would continue present dose of Lasix and follow renal function.  2 cardiomyopathy-etiology unclear. 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 Continue carvedilol. Resume lisinopril 10 mg daily. Titrate medications as tolerated. Repeat echocardiogram 3 months. If LV function remains decreased would need ischemia evaluation likely catheterization.  Would not pursue at this point given recent CVA.  3 hypertension-blood pressure remains elevated. Add lisinopril as outlined above and follow.  4 chronic stage III kidney disease-follow renal function closely with above medication adjustments.  5 status post CVA-transesophageal echocardiogram showed no source of embolus. Implantable loop recorder has been placed. Further management per primary service.  Olga Millers, MD   Signed, Olga Millers, MD  02/09/2016, 9:13 AM

## 2016-02-09 NOTE — Progress Notes (Signed)
[facility] PROGRESS NOTE  [name] ZOX:[id number] DOB: [birth year] DOA: 02/05/2016   PCP: [facility]     Assessment/Plan: Principal Problem:   Stroke (cerebrum) (HCC) Active Problems:   AKI (acute kidney injury) (HCC)   Essential hypertension   Acute on chronic diastolic CHF (congestive heart failure) (HCC)   Anemia of chronic disease   Diabetes mellitus, type II, insulin dependent (HCC)   OSA ? compliacne   Hypokalemia   Hypertensive urgency   Acute CVA (cerebrovascular accident) (HCC)   Cerebral infarction due to occlusion of left middle cerebral artery (HCC)   Hypertension   Right sided weakness   Acute on chronic congestive heart failure (HCC)   56 y.o.malewith medical history significant for chronic CHF, insulin-dependent diabetes mellitus, hypertension, depression, ulcerative colitis, prior CVA with minimal residual right-sided weakness, and obstructive sleep apnea who presents to the emergency department with dysarthria, confusion, lethargy, and right facial droop. Medical admission for further evaluation and management of acute CVA was advised. Patient will be admitted to the telemetry unit for ongoing evaluation and management of acute CVA.He did not receive IV t-PA due to being outside the window  MRI brain shows Acute small to moderate LEFT frontal lobe/ MCA territory infarct corresponding to perfusion abnormality.  Assessment and plan 1. Stroke:  Dominant left frontal/MCA territory infarct embolic pattern secondary to unknown source. Patient does have multiple stroke risk factors, however, cardiac embolic infarct cannot be ruled out.  Resultant  expressive aphasia, right hemiparesis  CTA head and neck L M3 occlusion, R pleural effusion, pulmonary edema.  CTP small to moderate size L MCA territory infarct  MRI  Small to moderate L frontal/MCA territory infarct. Old B BG, pontomedullary, R occipital/PCA infarcts.  2D Echo LV EF:  35%.Diffuse hypokinesis-cardiology consulted for further recommendations for CHF, TEE negative for embolic source.   loop recorder placed 02/08/2016  LE doppler no DVT  LDL 94  HgbA1c 6.4     Lovenox 40 mg sq daily for VTE prophylaxis  Diet Diet recommendations: Thin liquid;Dysphagia 1 (puree  No antithrombotic prior to admission, now on aspirin 300 mg suppository daily. Due to intracranial stenosis, neurology recommends DAPT with aspirin and Plavix for 3 months and then Plavix alone.  Therapy recommendations:   CIR  Disposition:   CIR Monday   2. Acute on chronic systolic/diastolic CHF  - Initially found to have BLE edema, JVD, crackles, BNP 3370, CXR with interstitial edema  - TTE (04/13/15) with EF 50-55%, grade 2 diastolic dysfunction, severe concentric hypertrophy, moderate-severe TR, mild AR, and moderate pulmonary HTN  - He has Lasix, Bumex, and Zaroxolyn, Aldactone, Coreg, and lisinopril on home med list EF down to 35% from normal EF by echo done at [facility] in 04/2015. Given IV lasix 80mg  BID x1 day then transitioned to PO Lasix 60mg  BID. Body weight down from 240>222 lbs  would continue with IV diuresis given elevated filling pressures on ECHO and marked LE edema.  Continue Lasix 80 mg IV twice a day, creatinine is stable Continue Coreg, lisinopril, Imdur, IV Lasix repeat echocardiogram likely in 2-3 months. If ejection fraction still remains decreased and he has recovered from his aphasia,  consider diagnostic heart catheterization at that time.  3. Hypertension with hypertensive urgency - BP 200/140 in ED, likely exacerbated by acute CHF and acute cerebral artery occlusion  - Given the acute ischemic CVA, will treat only for SBP >220 or DBP >110 for now in acute phase  4. Insulin-dependent DM  - A1c was 5.9% in 2016. Accu-Chek stable - Reduced dose of Lantus as CBG somewhat low this morning      5. Hypokalemia - Serum potassium 3.3 on admission , replete -  Monitor on telemetry  - Follow BMP qAM during diuresis    6. Normocytic anemia  - Hgb 12.1 on admission  - Stable relative to priors with no sign of bleeding   7. OSA - CPAP qHS     DVT prophylaxsis Lovenox  Code Status:  Full code    Family Communication: Discussed in detail with the patient/brother, all imaging results, lab results explained to the patient   Disposition Plan:  Continue IV diuresis, CIR likely Monday     Consultants:  Neurology  Cardiology  Procedures:  None  Antibiotics: Anti-infectives    None         HPI/Subjective: Patient is awake, follows commands  Objective: Vitals:   02/08/16 2206 02/09/16 0206 02/09/16 0444 02/09/16 0520  BP: (!) 179/92 (!) 161/87  (!) 160/87  Pulse: 66 60  (!) 59  Resp: 18 20  20   Temp: 97.7 F (36.5 C) 97.7 F (36.5 C)  97.5 F (36.4 C)  TempSrc: Oral Oral  Oral  SpO2: 100% 97%  99%  Weight:   100.9 kg (222 lb 8 oz)   Height:        Intake/Output Summary (Last 24 hours) at 02/09/16 0919 Last data filed at 02/08/16 1200  Gross per 24 hour  Intake                0 ml  Output                0 ml  Net                0 ml    Exam:  Examination:  General exam: Appears calm and comfortable  Respiratory system: Clear to auscultation. Respiratory effort normal. Cardiovascular system: S1 & S2 heard, RRR. No JVD, murmurs, rubs, gallops or clicks. No pedal edema. Gastrointestinal system: Abdomen is nondistended, soft and nontender. No organomegaly or masses felt. Normal bowel sounds heard. Central nervous system: sleepy drowsy, but arousable and following some commands. Still aphasic with right-sided weakness      Data Reviewed: I have personally reviewed following labs and imaging studies  Micro Results Recent Results (from the past 240 hour(s))  Blood culture (routine x 2)     Status: None (Preliminary result)   Collection Time: 02/05/16  3:32 PM  Result Value Ref Range Status   Specimen  Description BLOOD LEFT HAND AEROBIC BOTTLE ONLY  Final   Special Requests 5CC  Final   Culture NO GROWTH 3 DAYS  Final   Report Status PENDING  Incomplete  Blood culture (routine x 2)     Status: None (Preliminary result)   Collection Time: 02/05/16  3:37 PM  Result Value Ref Range Status   Specimen Description RIGHT ANTECUBITAL AEROBIC BOTTLE ONLY  Final   Special Requests 5CC  Final   Culture NO GROWTH 3 DAYS  Final   Report Status PENDING  Incomplete  Urine culture     Status: None   Collection Time: 02/05/16  4:24 PM  Result Value Ref Range Status   Specimen Description URINE, CATHETERIZED  Final   Special Requests NONE  Final   Culture NO GROWTH  Final   Report Status 02/06/2016 FINAL  Final    Radiology Reports Ct Angio  Head W Or Wo Contrast  Result Date: 02/05/2016 CLINICAL DATA:  Initial evaluation for acute right-sided weakness, slurred speech, aphasia. EXAM: CT ANGIOGRAPHY HEAD AND NECK TECHNIQUE: Multidetector CT imaging of the head and neck was performed using the standard protocol during bolus administration of intravenous contrast. Multiplanar CT image reconstructions and MIPs were obtained to evaluate the vascular anatomy. Carotid stenosis measurements (when applicable) are obtained utilizing NASCET criteria, using the distal internal carotid diameter as the denominator. CONTRAST:  90 cc of Isovue 370. COMPARISON:  Prior CT from earlier the same day. FINDINGS: CT HEAD FINDINGS Brain: Atrophy with chronic microvascular ischemic disease again noted. Remote lacunar infarcts within the bilateral basal ganglia. Now evident is subtle evolving hypodensity involving the left insular cortex extending towards the left frontal operculum (series 201, image 18). This is more evident as compared to previous examination performed earlier the same day. No significant mass effect. No acute intracranial hemorrhage. Hyperdensity at the level of the left sylvian fissure suspicious for thrombus  (series 201, image 19). No mass lesion, midline shift, or mass effect. No hydrocephalus. No extra-axial fluid collection. Vascular: Vascular calcifications within the carotid siphons. Hyperdensity at the level the left sylvian fissure suspicious for thrombus (series 201, image 19). No other hyperdense vessel. Skull: Scalp soft tissues within normal limits.  Calvarium intact. Sinuses: Globes and orbital soft tissues within normal limits. Patient is status post scleral banding on the left. Mild opacity within the right ethmoidal air cells. Paranasal sinuses are otherwise clear. No mastoid effusion. CTA NECK FINDINGS Aortic arch: Visualized aortic arch of normal caliber with normal branch pattern. Scattered atheromatous plaque within the arch itself and about the origin of the great vessels without flow-limiting stenosis. Visualized subclavian arteries are widely patent. Right carotid system: Right common carotid artery patent from its origin to the bifurcation. Scattered calcified plaque about the right bifurcation without flow-limiting stenosis. Right ICA patent distally to the skullbase without stenosis, dissection, or occlusion. Left carotid system: Left common carotid artery patent from its origin to the bifurcation. Scattered calcified plaque about the left bifurcation without flow-limiting stenosis. Left ICA patent distally to the skullbase without stenosis, dissection, or occlusion. Vertebral arteries: Both of the vertebral arteries arise from the subclavian arteries. The the vertebral arteries widely patent without stenosis, dissection, or occlusion. Focal plaque noted at the origin of the right vertebral artery without high-grade stenosis. Skeleton: No acute osseous abnormality. No worrisome lytic or blastic osseous lesions. Other neck: Visualized soft tissues of the neck demonstrate no acute abnormality. Upper chest: Visualized upper mediastinum within normal limits. Layering right pleural effusion. Probable  smaller left effusion. Scattered interlobular septal thickening within the partially visualized lungs, suggesting edema. Review of the MIP images confirms the above findings CTA HEAD FINDINGS Anterior circulation: Petrous segments patent bilaterally. Scattered calcified atheromatous plaque within the cavernous/ supraclinoid ICAs with mild to moderate multifocal narrowing. ICA termini patent. Left A1 segment dominant and widely patent. Right A1 segment hypoplastic but patent as well. Anterior communicating artery normal. Anterior cerebral arteries patent to their distal aspects. Right M1 segment patent without stenosis or occlusion. Right MCA bifurcation normal. Right MCA branches well opacified to their distal aspects. Left M1 segment patent without stenosis or occlusion. Left MCA bifurcation normal. No proximal M2 branch occlusion. There is abrupt occlusion of a left M3 branch, middle division (series 602, image 80). This corresponds with previously seen hyperdensity on noncontrast portion of this exam. No other arterial branch occlusion. Left MCA branches otherwise patent. Posterior  circulation: Scattered plaque within the V4 segments bilaterally, right greater than left with mild multifocal narrowing. Posterior inferior cerebral arteries patent bilaterally. Basilar artery widely patent. Superior cerebellar arteries patent bilaterally. Both of the posterior cerebral arteries supplied mainly via the basilar artery and are patent to their distal aspects. Venous sinuses: Patent. Anatomic variants: No significant anatomic variant. No aneurysm or vascular malformation. Delayed phase: Not performed. Review of the MIP images confirms the above findings IMPRESSION: 1. 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 2. Scattered atheromatous plaque throughout the carotid siphons with mild to moderate multifocal narrowing. 3. Atheromatous plaque about the  carotid bifurcations bilaterally without flow-limiting stenosis. 4. Large layering right pleural effusion, with smaller left pleural effusion. Interlobular septal thickening suggestive of pulmonary edema. Critical Value/emergent results were called by telephone at the time of interpretation on 02/05/2016 at 6:19 pm to Dr. Ritta Slot , who verbally acknowledged these results. Electronically Signed   By: Rise Mu M.D.   On: 02/05/2016 19:04   Ct Head Wo Contrast  Result Date: 02/05/2016 CLINICAL DATA:  Altered mental status, aphasia, right-sided weakness EXAM: CT HEAD WITHOUT CONTRAST TECHNIQUE: Contiguous axial images were obtained from the base of the skull through the vertex without intravenous contrast. COMPARISON:  MRI 05/19/2000 FINDINGS: Brain: Old lacunar infarct in the left basal ganglia. Old infarct and encephalomalacia in the right occipital lobe. No acute intracranial abnormality. Specifically, no hemorrhage, hydrocephalus, mass lesion, acute infarction, or significant intracranial injury. Vascular: No hyperdense vessel or unexpected calcification. Skull: No acute calvarial abnormality. Sinuses/Orbits: Visualized paranasal sinuses and mastoids clear. Orbital soft tissues unremarkable. Other: None IMPRESSION: Old left basal ganglia and right occipital infarct. No acute intracranial abnormality. Electronically Signed   By: Charlett Nose M.D.   On: 02/05/2016 15:25   Ct Angio Neck W Or Wo Contrast  Result Date: 02/05/2016 CLINICAL DATA:  Initial evaluation for acute right-sided weakness, slurred speech, aphasia. EXAM: CT ANGIOGRAPHY HEAD AND NECK TECHNIQUE: Multidetector CT imaging of the head and neck was performed using the standard protocol during bolus administration of intravenous contrast. Multiplanar CT image reconstructions and MIPs were obtained to evaluate the vascular anatomy. Carotid stenosis measurements (when applicable) are obtained utilizing NASCET criteria, using the  distal internal carotid diameter as the denominator. CONTRAST:  90 cc of Isovue 370. COMPARISON:  Prior CT from earlier the same day. FINDINGS: CT HEAD FINDINGS Brain: Atrophy with chronic microvascular ischemic disease again noted. Remote lacunar infarcts within the bilateral basal ganglia. Now evident is subtle evolving hypodensity involving the left insular cortex extending towards the left frontal operculum (series 201, image 18). This is more evident as compared to previous examination performed earlier the same day. No significant mass effect. No acute intracranial hemorrhage. Hyperdensity at the level of the left sylvian fissure suspicious for thrombus (series 201, image 19). No mass lesion, midline shift, or mass effect. No hydrocephalus. No extra-axial fluid collection. Vascular: Vascular calcifications within the carotid siphons. Hyperdensity at the level the left sylvian fissure suspicious for thrombus (series 201, image 19). No other hyperdense vessel. Skull: Scalp soft tissues within normal limits.  Calvarium intact. Sinuses: Globes and orbital soft tissues within normal limits. Patient is status post scleral banding on the left. Mild opacity within the right ethmoidal air cells. Paranasal sinuses are otherwise clear. No mastoid effusion. CTA NECK FINDINGS Aortic arch: Visualized aortic arch of normal caliber with normal branch pattern. Scattered atheromatous plaque within the  arch itself and about the origin of the great vessels without flow-limiting stenosis. Visualized subclavian arteries are widely patent. Right carotid system: Right common carotid artery patent from its origin to the bifurcation. Scattered calcified plaque about the right bifurcation without flow-limiting stenosis. Right ICA patent distally to the skullbase without stenosis, dissection, or occlusion. Left carotid system: Left common carotid artery patent from its origin to the bifurcation. Scattered calcified plaque about the left  bifurcation without flow-limiting stenosis. Left ICA patent distally to the skullbase without stenosis, dissection, or occlusion. Vertebral arteries: Both of the vertebral arteries arise from the subclavian arteries. The the vertebral arteries widely patent without stenosis, dissection, or occlusion. Focal plaque noted at the origin of the right vertebral artery without high-grade stenosis. Skeleton: No acute osseous abnormality. No worrisome lytic or blastic osseous lesions. Other neck: Visualized soft tissues of the neck demonstrate no acute abnormality. Upper chest: Visualized upper mediastinum within normal limits. Layering right pleural effusion. Probable smaller left effusion. Scattered interlobular septal thickening within the partially visualized lungs, suggesting edema. Review of the MIP images confirms the above findings CTA HEAD FINDINGS Anterior circulation: Petrous segments patent bilaterally. Scattered calcified atheromatous plaque within the cavernous/ supraclinoid ICAs with mild to moderate multifocal narrowing. ICA termini patent. Left A1 segment dominant and widely patent. Right A1 segment hypoplastic but patent as well. Anterior communicating artery normal. Anterior cerebral arteries patent to their distal aspects. Right M1 segment patent without stenosis or occlusion. Right MCA bifurcation normal. Right MCA branches well opacified to their distal aspects. Left M1 segment patent without stenosis or occlusion. Left MCA bifurcation normal. No proximal M2 branch occlusion. There is abrupt occlusion of a left M3 branch, middle division (series 602, image 80). This corresponds with previously seen hyperdensity on noncontrast portion of this exam. No other arterial branch occlusion. Left MCA branches otherwise patent. Posterior circulation: Scattered plaque within the V4 segments bilaterally, right greater than left with mild multifocal narrowing. Posterior inferior cerebral arteries patent bilaterally.  Basilar artery widely patent. Superior cerebellar arteries patent bilaterally. Both of the posterior cerebral arteries supplied mainly via the basilar artery and are patent to their distal aspects. Venous sinuses: Patent. Anatomic variants: No significant anatomic variant. No aneurysm or vascular malformation. Delayed phase: Not performed. Review of the MIP images confirms the above findings IMPRESSION: 1. 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 2. Scattered atheromatous plaque throughout the carotid siphons with mild to moderate multifocal narrowing. 3. Atheromatous plaque about the carotid bifurcations bilaterally without flow-limiting stenosis. 4. Large layering right pleural effusion, with smaller left pleural effusion. Interlobular septal thickening suggestive of pulmonary edema. Critical Value/emergent results were called by telephone at the time of interpretation on 02/05/2016 at 6:19 pm to Dr. Ritta Slot , who verbally acknowledged these results. Electronically Signed   By: Rise Mu M.D.   On: 02/05/2016 19:04   Mr Brain Wo Contrast  Result Date: 02/06/2016 CLINICAL DATA:  Acute on chronic RIGHT-sided weakness, aphasia. Follow-up M3 occlusion. Assess stroke versus PRES. History of hypertension and diabetes. EXAM: MRI HEAD WITHOUT CONTRAST TECHNIQUE: Multiplanar, multiecho pulse sequences of the brain and surrounding structures were obtained without intravenous contrast. COMPARISON:  CT HEAD February 05, 2016 and MRI of the head May 20, 2015 FINDINGS: BRAIN: Confluent reduced diffusion measuring up to 5.8 cm in LEFT frontal lobe, involving the insula and operculum with low ADC values. Non contiguous patchy reduced diffusion LEFT anterior frontal lobe.  No susceptibility artifact to suggest acute hemorrhage. The ventricles and sulci are normal for patient's age. Small area RIGHT occipital lobe encephalomalacia. Old LEFT  pontomedullary infarcts. Old bilateral basal ganglia lacunar infarcts. Symmetric basal ganglia susceptibility artifact most consistent with old hemorrhage. Patchy supratentorial and pontine white matter FLAIR T2 hyperintensities. Mild ventriculomegaly on the basis of global parenchymal brain volume loss. No intraparenchymal mass effect, masses. No abnormal extra-axial fluid collections. VASCULAR: Normal major intracranial vascular flow voids present at skull base. SKULL AND UPPER CERVICAL SPINE: Empty sella. No suspicious calvarial bone marrow signal. Craniocervical junction maintained. SINUSES/ORBITS: Mild paranasal sinus mucosal thickening. The included ocular globes and orbital contents are non-suspicious. Status post LEFT scleral banding and ocular lens implant. OTHER: None. IMPRESSION: Acute small to moderate LEFT frontal lobe/ MCA territory infarct corresponding to perfusion abnormality. 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 Old basal ganglia and pontomedullary lacunar infarcts. Old RIGHT occipital lobe/ PCA territory infarct. Mild to moderate chronic small vessel ischemic disease. Mild atrophy. Electronically Signed   By: Awilda Metro M.D.   On: 02/06/2016 06:01   Ct Cerebral Perfusion W Contrast  Result Date: 02/05/2016 CLINICAL DATA:  Initial evaluation for acute right-sided weakness. EXAM: CT PERFUSION BRAIN TECHNIQUE: Multiphase CT imaging of the brain was performed following IV bolus contrast injection. Subsequent parametric perfusion maps were calculated using RAPID software. CONTRAST:  90 cc of Isovue 370. FINDINGS: CT Brain Perfusion Findings: CBF (<30%) Volume: 21 ccmL Perfusion (Tmax>6.0s) volume: 92 ccmL Mismatch Volume: 71 ccmL Infarction Location:Core infarct primarily involves the left frontal operculum/anterior left frontal lobe, with some extension towards the left insula and left temporal lobe. Please note that while the mismatch volume is measured as 71 cc of this exam,  there is felt to be some artifact on the T max perfusion images, with elevated T-max seen not only around the core infarct, but also within the right cerebral hemisphere as well as the right cerebellar hemisphere. Overall, while there is likely some penumbra about the area of core infarction, the recorded 71 cc is likely not accurate as to the volume of actual mismatch (which is likely significantly less, although difficult to accurately quantify on this exam). IMPRESSION: Acute small to moderate-sized left MCA territory infarct as above. Critical Value/emergent results were called by telephone at the time of interpretation on 02/05/2016 at 6:19 pm to Dr. Ritta Slot , who verbally acknowledged these results. Electronically Signed   By: Rise Mu M.D.   On: 02/05/2016 18:27   Dg Chest Port 1 View  Result Date: 02/05/2016 CLINICAL DATA:  Acute altered mental status. EXAM: PORTABLE CHEST 1 VIEW COMPARISON:  07/26/2015 FINDINGS: The patient has developed cardiomegaly with pulmonary vascular congestion and slight bilateral interstitial pulmonary edema. No acute bone abnormality. IMPRESSION: New cardiomegaly with pulmonary vascular congestion and slight bilateral interstitial pulmonary edema. Electronically Signed   By: Francene Boyers M.D.   On: 02/05/2016 16:01   Dg Swallowing Func-speech Pathology  Result Date: 02/06/2016 Objective Swallowing Evaluation: Type of Study: MBS-Modified Barium Swallow Study Patient Details Name: Uzziah Rigg MRN: [id number] Date of Birth: 25-Feb-1960 Today's Date: 02/06/2016 Time: SLP Start Time (ACUTE ONLY): 1017-SLP Stop Time (ACUTE ONLY): 1033 SLP Time Calculation (min) (ACUTE ONLY): 16 min Past Medical History: Past Medical History: Diagnosis Date . BPH (benign prostatic hyperplasia)  . CHF (congestive heart failure) (HCC)  . Diabetes mellitus without complication (HCC)  . Diabetic retinopathy associated with type 2 diabetes mellitus (HCC)  . Hypertension  .  Lymphedema  .  OSA (obstructive sleep apnea)  . TIA (transient ischemic attack)  Past Surgical History: Past Surgical History: Procedure Laterality Date . BLADDER SURGERY   . CARDIAC CATHETERIZATION   HPI: Jersey Ravenscroft a 56 y.o.malewith medical history significant for chronic CHF, insulin-dependent diabetes mellitus, hypertension, depression, OSA, ulcerative colitis, prior CVA with minimal residual right-sided weakness who presents to the emergency department with dysarthria, confusion, lethargy, and right facial droop. Patient lives alone and family resides in [facility], [place] and [place]. Chest x-ray is notable for new cardiomegaly with pulmonary vascular congestion and slight bilateral interstitial edema. MRI acute small to moderate LEFT frontal lobe/ MCA territory infarct corresponding to perfusion abnormality. 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 Old basal ganglia. MBS recommended following BSE. No Data Recorded Assessment / Plan / Recommendation CHL IP CLINICAL IMPRESSIONS 02/06/2016 Therapy Diagnosis Moderate oral phase dysphagia;Mild pharyngeal phase dysphagia Clinical Impression Labial spillage, delayed transit and intermittent piecemeal pattern describe pt's oral function. Swallow initiation delayed to pyriform sinuses due to decreased sensation without penetration or aspiration with consecutive cup and straw sips and minimal vallecular residue. Upon clinical observation during bedside swallow and following MBS, pt has multiple swallows and intermittent mild throat clears with the appearance of difficulty however pt safe with consecutive larger sips. Risk for pocketing present therefore recommend Dys 2 texture and thin liquids, crush meds, straws allowed, sit upright and check right side oral cavity for pocketing. ST will continue to follow.     Impact on safety and function Mild aspiration risk   CHL IP TREATMENT RECOMMENDATION 02/06/2016 Treatment Recommendations Therapy as outlined in  treatment plan below   Prognosis 02/06/2016 Prognosis for Safe Diet Advancement Good Barriers to Reach Goals -- Barriers/Prognosis Comment -- CHL IP DIET RECOMMENDATION 02/06/2016 SLP Diet Recommendations Dysphagia 2 (Fine chop) solids;Thin liquid Liquid Administration via Straw;Cup Medication Administration Crushed with puree Compensations Slow rate;Small sips/bites;Minimize environmental distractions;Lingual sweep for clearance of pocketing Postural Changes Seated upright at 90 degrees   CHL IP OTHER RECOMMENDATIONS 02/06/2016 Recommended Consults -- Oral Care Recommendations Oral care BID Other Recommendations --   CHL IP FOLLOW UP RECOMMENDATIONS 02/06/2016 Follow up Recommendations Inpatient Rehab   CHL IP FREQUENCY AND DURATION 02/06/2016 Speech Therapy Frequency (ACUTE ONLY) min 2x/week Treatment Duration 2 weeks      CHL IP ORAL PHASE 02/06/2016 Oral Phase Impaired Oral - Pudding Teaspoon -- Oral - Pudding Cup -- Oral - Honey Teaspoon -- Oral - Honey Cup -- Oral - Nectar Teaspoon -- Oral - Nectar Cup -- Oral - Nectar Straw -- Oral - Thin Teaspoon -- Oral - Thin Cup Delayed oral transit;Piecemeal swallowing Oral - Thin Straw Delayed oral transit;Piecemeal swallowing Oral - Puree -- Oral - Mech Soft -- Oral - Regular Delayed oral transit;Weak lingual manipulation Oral - Multi-Consistency -- Oral - Pill -- Oral Phase - Comment --  CHL IP PHARYNGEAL PHASE 02/06/2016 Pharyngeal Phase Impaired Pharyngeal- Pudding Teaspoon -- Pharyngeal -- Pharyngeal- Pudding Cup -- Pharyngeal -- Pharyngeal- Honey Teaspoon -- Pharyngeal -- Pharyngeal- Honey Cup -- Pharyngeal -- Pharyngeal- Nectar Teaspoon -- Pharyngeal -- Pharyngeal- Nectar Cup Delayed swallow initiation-pyriform sinuses Pharyngeal -- Pharyngeal- Nectar Straw -- Pharyngeal -- Pharyngeal- Thin Teaspoon -- Pharyngeal -- Pharyngeal- Thin Cup Delayed swallow initiation-pyriform sinuses;Pharyngeal residue - valleculae Pharyngeal -- Pharyngeal- Thin Straw Delayed swallow  initiation-pyriform sinuses;Pharyngeal residue - valleculae Pharyngeal -- Pharyngeal- Puree -- Pharyngeal -- Pharyngeal- Mechanical Soft -- Pharyngeal -- Pharyngeal- Regular WFL Pharyngeal -- Pharyngeal- Multi-consistency -- Pharyngeal -- Pharyngeal- Pill -- Pharyngeal -- Pharyngeal Comment --  CHL IP CERVICAL ESOPHAGEAL PHASE 02/06/2016 Cervical Esophageal Phase WFL Pudding Teaspoon -- Pudding Cup -- Honey Teaspoon -- Honey Cup -- Nectar Teaspoon -- Nectar Cup -- Nectar Straw -- Thin Teaspoon -- Thin Cup -- Thin Straw -- Puree -- Mechanical Soft -- Regular -- Multi-consistency -- Pill -- Cervical Esophageal Comment -- No flowsheet data found. Royce Macadamia 02/06/2016, 11:14 AM Breck Coons Lonell Face.Ed CCC-SLP Pager [phone]                CBC  Recent Labs Lab 02/05/16 1531 02/05/16 1532 02/08/16 0710  WBC  --  6.3 6.2  HGB 12.6* 12.1* 11.4*  HCT 37.0* 37.0* 34.8*  PLT  --  230 219  MCV  --  87.7 87.0  MCH  --  28.7 28.5  MCHC  --  32.7 32.8  RDW  --  16.1* 15.7*  LYMPHSABS  --  1.0  --   MONOABS  --  0.3  --   EOSABS  --  0.1  --   BASOSABS  --  0.0  --     Chemistries   Recent Labs Lab 02/05/16 1532 02/06/16 0429 02/07/16 0730 02/08/16 0710 02/09/16 0724  NA 143 145 143 143 143  K 3.3* 3.8 3.4* 3.5 3.6  CL 111 110 110 107 107  CO2 25 24 28 28 28   GLUCOSE 105* 112* 70 86 84  BUN 24* 23* 21* 17 18  CREATININE 1.48* 1.45* 1.58* 1.38* 1.43*  CALCIUM 9.1 8.9 8.7* 8.7* 8.8*  MG  --  1.9  --   --   --   AST 42*  --   --  27  --   ALT 27  --   --  18  --   ALKPHOS 227*  --   --  181*  --   BILITOT 0.9  --   --  0.6  --    ------------------------------------------------------------------------------------------------------------------ estimated creatinine clearance is 71.4 mL/min (by C-G formula based on SCr of 1.43 mg/dL (H)). ------------------------------------------------------------------------------------------------------------------ No results for input(s):  HGBA1C in the last 72 hours. ------------------------------------------------------------------------------------------------------------------ No results for input(s): CHOL, HDL, LDLCALC, TRIG, CHOLHDL, LDLDIRECT in the last 72 hours. ------------------------------------------------------------------------------------------------------------------ No results for input(s): TSH, T4TOTAL, T3FREE, THYROIDAB in the last 72 hours.  Invalid input(s): FREET3 ------------------------------------------------------------------------------------------------------------------ No results for input(s): VITAMINB12, FOLATE, FERRITIN, TIBC, IRON, RETICCTPCT in the last 72 hours.  Coagulation profile  Recent Labs Lab 02/05/16 1532  INR 1.18    No results for input(s): DDIMER in the last 72 hours.  Cardiac Enzymes No results for input(s): CKMB, TROPONINI, MYOGLOBIN in the last 168 hours.  Invalid input(s): CK ------------------------------------------------------------------------------------------------------------------ Invalid input(s): POCBNP   CBG:  Recent Labs Lab 02/08/16 1654 02/08/16 2009 02/08/16 2342 02/09/16 0353 02/09/16 0732  GLUCAP 101* 104* 95 88 84       Studies: No results found.    Lab Results  Component Value Date   HGBA1C 6.4 (H) 02/05/2016   Lab Results  Component Value Date   LDLCALC 94 02/06/2016   CREATININE 1.43 (H) 02/09/2016       Scheduled Meds: . aspirin EC  325 mg Oral Daily   Or  . aspirin  300 mg Rectal Daily  . atorvastatin  20 mg Oral QHS  . calcium-vitamin D  1 tablet Oral Daily  . carvedilol  50 mg Oral BID  . citalopram  40 mg Oral Daily  . clopidogrel  75 mg Oral Daily  . doxazosin  4 mg Oral  QHS  . enoxaparin (LOVENOX) injection  40 mg Subcutaneous Q24H  . furosemide  80 mg Intravenous BID  . insulin aspart  0-9 Units Subcutaneous Q4H  . insulin glargine  5 Units Subcutaneous QHS  . isosorbide mononitrate  30 mg Oral Daily   . lisinopril  2.5 mg Oral Daily  . magnesium oxide  400 mg Oral BID  . multivitamin with minerals  1 tablet Oral Daily  . pantoprazole  40 mg Oral Daily  . potassium chloride  40 mEq Oral Daily  . sodium chloride flush  3 mL Intravenous Q12H  . sulfaSALAzine  2,000 mg Oral BID  . tamsulosin  0.4 mg Oral QHS   Continuous Infusions:   LOS: 4 days    Time spent: >30 MINS    [facility]  [facility] Pager [phone]. If 7PM-7AM, please contact night-coverage at [email or web address], password [facility] 02/09/2016, 9:19 AM  LOS: 4 days

## 2016-02-09 NOTE — Progress Notes (Signed)
Patient medications given crushed with ice cream.  Patient vomited moderate amount approximately 3 minutes later.

## 2016-02-09 NOTE — Progress Notes (Signed)
Verbal order to give extra dose Plavix, aspirin suppository.

## 2016-02-10 DIAGNOSIS — I10 Essential (primary) hypertension: Secondary | ICD-10-CM

## 2016-02-10 DIAGNOSIS — E876 Hypokalemia: Secondary | ICD-10-CM

## 2016-02-10 LAB — CBC
HEMATOCRIT: 38.9 % — AB (ref 39.0–52.0)
HEMOGLOBIN: 12.5 g/dL — AB (ref 13.0–17.0)
MCH: 28.4 pg (ref 26.0–34.0)
MCHC: 32.1 g/dL (ref 30.0–36.0)
MCV: 88.4 fL (ref 78.0–100.0)
Platelets: 212 10*3/uL (ref 150–400)
RBC: 4.4 MIL/uL (ref 4.22–5.81)
RDW: 16.1 % — ABNORMAL HIGH (ref 11.5–15.5)
WBC: 6.6 10*3/uL (ref 4.0–10.5)

## 2016-02-10 LAB — CULTURE, BLOOD (ROUTINE X 2)
CULTURE: NO GROWTH
Culture: NO GROWTH

## 2016-02-10 LAB — GLUCOSE, CAPILLARY
GLUCOSE-CAPILLARY: 63 mg/dL — AB (ref 65–99)
GLUCOSE-CAPILLARY: 107 mg/dL — AB (ref 65–99)
GLUCOSE-CAPILLARY: 99 mg/dL (ref 65–99)
GLUCOSE-CAPILLARY: 106 mg/dL — AB (ref 65–99)
GLUCOSE-CAPILLARY: 130 mg/dL — AB (ref 65–99)
Glucose-Capillary: 100 mg/dL — ABNORMAL HIGH (ref 65–99)
Glucose-Capillary: 82 mg/dL (ref 65–99)

## 2016-02-10 LAB — BASIC METABOLIC PANEL
ANION GAP: 7 (ref 5–15)
BUN: 17 mg/dL (ref 6–20)
CHLORIDE: 104 mmol/L (ref 101–111)
CO2: 32 mmol/L (ref 22–32)
Calcium: 8.8 mg/dL — ABNORMAL LOW (ref 8.9–10.3)
Creatinine, Ser: 1.39 mg/dL — ABNORMAL HIGH (ref 0.61–1.24)
GFR calc Af Amer: >60 mL/min (ref >60)
GFR, EST NON AFRICAN AMERICAN: 56 mL/min — AB (ref >60)
Glucose, Bld: 107 mg/dL — ABNORMAL HIGH (ref 65–99)
POTASSIUM: 3.2 mmol/L — AB (ref 3.5–5.1)
SODIUM: 143 mmol/L (ref 135–145)

## 2016-02-10 MED ORDER — INSULIN ASPART 100 UNIT/ML ~~LOC~~ SOLN
0.0000 [IU] | SUBCUTANEOUS | Status: DC
Start: 2016-02-10 — End: 2016-02-10

## 2016-02-10 MED ORDER — INSULIN ASPART 100 UNIT/ML ~~LOC~~ SOLN
0.0000 [IU] | Freq: Three times a day (TID) | SUBCUTANEOUS | Status: DC
  Administered 2016-02-10: 1 [IU] via SUBCUTANEOUS

## 2016-02-10 MED ORDER — POTASSIUM CHLORIDE CRYS ER 20 MEQ PO TBCR
40.0000 meq | EXTENDED_RELEASE_TABLET | Freq: Two times a day (BID) | ORAL | Status: AC
  Administered 2016-02-10 – 2016-02-11 (×3): 40 meq via ORAL
  Filled 2016-02-10 (×3): qty 2

## 2016-02-10 MED ORDER — DEXTROSE 50 % IV SOLN
INTRAVENOUS | Status: AC
  Administered 2016-02-10: 25 mL
  Filled 2016-02-10: qty 50

## 2016-02-10 MED ORDER — LISINOPRIL 20 MG PO TABS
20.0000 mg | ORAL_TABLET | Freq: Every day | ORAL | Status: DC
  Administered 2016-02-10 – 2016-02-11 (×2): 20 mg via ORAL
  Filled 2016-02-10 (×2): qty 1

## 2016-02-10 NOTE — Evaluation (Signed)
Clinical/Bedside Swallow Evaluation Patient Details  Name: [name] MRN: [id number] Date of Birth: [birth year]  Today's Date: 02/10/2016 Time: SLP Start Time (ACUTE ONLY): 1143 SLP Stop Time (ACUTE ONLY): 1204 SLP Time Calculation (min) (ACUTE ONLY): 21 min  Past Medical History:  Past Medical History:  Diagnosis Date  . BPH (benign prostatic hyperplasia)   . CHF (congestive heart failure) (HCC)   . Diabetes mellitus without complication (HCC)   . Diabetic retinopathy associated with type 2 diabetes mellitus (HCC)   . Hypertension   . Lymphedema   . OSA (obstructive sleep apnea)   . TIA (transient ischemic attack)    Past Surgical History:  Past Surgical History:  Procedure Laterality Date  . BLADDER SURGERY    . CARDIAC CATHETERIZATION     HPI:  [name] a 56 y.o.malewith medical history significant for chronic CHF, insulin-dependent diabetes mellitus, hypertension, depression, OSA, ulcerative colitis, prior CVA with minimal residual right-sided weakness who presents to the emergency department with dysarthria, confusion, lethargy, and right facial droop. Patient lives alone and family resides in [facility], [place] and [place]. Chest x-ray is notable for new cardiomegaly with pulmonary vascular congestion and slight bilateral interstitial edema. MRI acute small to moderate LEFT frontal lobe/ MCA territory infarct corresponding to perfusion abnormality. 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 Old basal ganglia.    Assessment / Plan / Recommendation Clinical Impression  ST follow up at request of nursing due to concerns about swallowing.  Nursing was reporting issues with pocketing.  When ST entered the room he was sleeping soundly and had difficulty waking up.  Given verbal and tactile stimuli he was finally able to waken enough to participate.  The patient was assessed using dysphagia 1 (pureed material) and thin liquids via spoon, cup and straw.  The patient  was extremely slow orally to move the pureed material and required max cues to do so.  Post swallow mild oral residue was noted.  Use of a liquid wash assisted to clear oral cavity.  Oral transit was much more timely given thin liquids.  Anterior escape was not observed today.  Overt s/s of aspiration were not seen at bedside.   Recommend continue with a dysphagia 1 diet with thin liquids with FULL ASSISTANCE for all intake.  ST will continue to follow during acute stay.      Aspiration Risk  Mild aspiration risk    Diet Recommendation   Dysphagia 1 with thin liquids.    Medication Administration: Crushed with puree    Other  Recommendations Oral Care Recommendations: Oral care BID   Follow up Recommendations Inpatient Rehab      Frequency and Duration min 2x/week  2 weeks       Prognosis Prognosis for Safe Diet Advancement: Good      Swallow Study   General Date of Onset: 02/05/16 HPI: [name] a 56 y.o.malewith medical history significant for chronic CHF, insulin-dependent diabetes mellitus, hypertension, depression, OSA, ulcerative colitis, prior CVA with minimal residual right-sided weakness who presents to the emergency department with dysarthria, confusion, lethargy, and right facial droop. Patient lives alone and family resides in [facility], [place] and [place]. Chest x-ray is notable for new cardiomegaly with pulmonary vascular congestion and slight bilateral interstitial edema. MRI acute small to moderate LEFT frontal lobe/ MCA territory infarct corresponding to perfusion abnormality. 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 Old basal ganglia.  Type of Study: Bedside Swallow Evaluation Previous Swallow Assessment: 0/04/2016 MBS  Diet Prior to this  Study: Dysphagia 1 (puree);Thin liquids Temperature Spikes Noted: No History of Recent Intubation: No Behavior/Cognition: Cooperative;Lethargic/Drowsy;Requires cueing Oral Cavity Assessment: Dry Oral Care  Completed by SLP: No Oral Cavity - Dentition: Missing dentition;Poor condition Vision: Functional for self-feeding Self-Feeding Abilities: Total assist Patient Positioning: Partially reclined Baseline Vocal Quality: Low vocal intensity Volitional Cough: Cognitively unable to elicit Volitional Swallow: Unable to elicit    Oral/Motor/Sensory Function     Ice Chips Ice chips: Not tested   Thin Liquid Thin Liquid: Impaired Presentation: Cup;Straw;Spoon Oral Phase Impairments: Reduced labial seal Oral Phase Functional Implications: Prolonged oral transit Pharyngeal  Phase Impairments: Suspected delayed Swallow    Nectar Thick Nectar Thick Liquid: Not tested   Honey Thick Honey Thick Liquid: Not tested   Puree Puree: Impaired Presentation: Spoon Oral Phase Impairments: Reduced labial seal;Poor awareness of bolus;Impaired mastication Oral Phase Functional Implications: Prolonged oral transit;Oral residue;Oral holding Pharyngeal Phase Impairments: Suspected delayed Swallow   Solid   GO   Solid: Not tested        Dimas Aguas, MA, CCC-SLP Acute Rehab SLP [phone] Dimas Aguas N 02/10/2016,12:34 PM

## 2016-02-10 NOTE — PMR Pre-admission (Signed)
PMR Admission Coordinator Pre-Admission Assessment  Patient: [name] is an 56 y.o., male MRN: [id number] DOB: May 07, 1960 Height: 5\' 11"  (180.3 cm) Weight: 96.4 kg (212 lb 8 oz)              Insurance Information HMO:     PPO:      PCP:      IPA:      80/20: yes     OTHER: no HMO PRIMARY: Medicare a and b      Policy#: [id number]      Subscriber: pt Benefits:  Phone #: Passport one online     Name: 02/06/2016 Eff. Date: a 01/04/08 b 08/04/2010     Deduct: $1340      Out of Pocket Max: none      Life Max: none CIR: 100%      SNF: 20 full days Outpatient: 80%     Co-Pay: 20% Home Health: 100%      Co-Pay: none DME: 80%     Co-Pay: 20% Providers: pt choice  SECONDARY: Sharen Counter      Policy#: [id number]      Subscriber: pt  Medicaid Application Date:       Case Manager:  Disability Application Date:       Case Worker:   Emergency Contact Information Contact Information    Name Relation Home Work Mobile   Hollingsworth,Mitchell Brother  [phone]   Cillian, Vallee   [phone]     Current Medical History  Patient Admitting Diagnosis: left frontal MCA infarct  History of Present Illness:   : HPI: Azlaan Crissey a 56 y.o.right handed malewith history of diastolic congestive heart failure,OSA,diabetes mellitus and peripheral neuropathy, hypertension, TIA maintained on aspirin, chronic renal insufficiency creatinine baseline 1.50-1.58. Presented 02/05/2016 with slurred speech, altered mental status and right-sided weakness. He was hypertensive 200/140. EKG showed sinus rhythm with LAFB and LVH. Chest x-ray new cardiomegaly with pulmonary vascular congestion and slight bilateral interstitial edema. Cranial CT scan negative for acute changes. 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 CT cerebral perfusion showed acute small to moderate-sized left MCA territory infarct. Patient did not receive TPA. CT angiogram head and neck showed acute left M3 occlusion  as well as scattered atheromatousplaque. MRI completed 02/07/2016 showed moderate left frontal lobe MCA territory infarct as well as old bilateral basal ganglia infarcts and right occipital lobe. Echocardiogram with ejection fraction of 35% diffuse hypokinesis with grade 2 diastolic dysfunction. Venous Doppler studies negative for DVT. Maintained on aspirin for Plavix/ CVA prophylaxis 3 months then Plavix alone. Subcutaneous Lovenox for DVT prophylaxis. Dysphagia# 2thin liquid diets.TEE completed 02/08/2016 showing severe LV dysfunction with EF of 30% no thrombi and loop recorder was placed.   Cardiology consulted for acute systolic heart failure and newly reduced ejection fraction.: Plan outlined:     1 acute systolic congestive heart failure-patient remains volume overloaded but improving. Would continue present dose of Lasix and follow renal function. May be able to reduce dose in AM 02/11/16. 2 cardiomyopathy-etiology unclear. 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 Continue carvedilol. Increase lisinopril to 20 mg daily. Titrate medications as tolerated. Repeat echocardiogram 3 months. If LV function remains decreased would need ischemia evaluation likely catheterization. Would not pursue at this point given recent CVA. 3 hypertension-blood pressure remains elevated. Increase lisinopril and adjust regimen as needed. 4 chronic stage III kidney disease-follow renal function closely with above medication adjustments.  Total: 7 NIHSS  Past Medical History  Past Medical History:  Diagnosis Date  . BPH (benign prostatic hyperplasia)   . CHF (congestive heart failure) (HCC)   . Diabetes mellitus without complication (HCC)   . Diabetic retinopathy associated with type 2 diabetes mellitus (HCC)   . Hypertension   . Lymphedema   . OSA (obstructive sleep apnea)   . TIA (transient ischemic attack)    Family History  family history includes Cancer in his father and  mother.  Prior Rehab/Hospitalizations:  Has the patient had major surgery during 100 days prior to admission? No  Current Medications   Current Facility-Administered Medications:  .  0.9 %  sodium chloride infusion, 250 mL, Intravenous, PRN, Lavone Neri Opyd, MD .  acetaminophen (TYLENOL) tablet 650 mg, 650 mg, Oral, Q4H PRN, Briscoe Deutscher, MD .  aspirin EC tablet 325 mg, 325 mg, Oral, Daily, 325 mg at 02/11/16 1059 **OR** aspirin suppository 300 mg, 300 mg, Rectal, Daily, Marvel Plan, MD, 300 mg at 02/10/16 1036 .  atorvastatin (LIPITOR) tablet 20 mg, 20 mg, Oral, QHS, Lavone Neri Opyd, MD, 20 mg at 02/10/16 2104 .  calcium-vitamin D (OSCAL WITH D) 500-200 MG-UNIT per tablet 1 tablet, 1 tablet, Oral, Daily, Briscoe Deutscher, MD, 1 tablet at 02/11/16 1059 .  carvedilol (COREG) tablet 50 mg, 50 mg, Oral, BID, Janetta Hora, PA-C, 50 mg at 02/11/16 1059 .  citalopram (CELEXA) tablet 40 mg, 40 mg, Oral, Daily, Lavone Neri Opyd, MD, 40 mg at 02/11/16 1059 .  clopidogrel (PLAVIX) tablet 75 mg, 75 mg, Oral, Daily, Marvel Plan, MD, 75 mg at 02/11/16 1059 .  docusate sodium (COLACE) capsule 100 mg, 100 mg, Oral, Daily PRN, Lavone Neri Opyd, MD .  doxazosin (CARDURA) tablet 4 mg, 4 mg, Oral, QHS, Lavone Neri Opyd, MD, 4 mg at 02/10/16 2104 .  enoxaparin (LOVENOX) injection 40 mg, 40 mg, Subcutaneous, Q24H, Lavone Neri Opyd, MD, 40 mg at 02/10/16 2103 .  furosemide (LASIX) injection 80 mg, 80 mg, Intravenous, BID, Janetta Hora, PA-C, 80 mg at 02/11/16 0830 .  insulin aspart (novoLOG) injection 0-9 Units, 0-9 Units, Subcutaneous, TID AC & HS, Richarda Overlie, MD, 1 Units at 02/10/16 2208 .  insulin glargine (LANTUS) injection 5 Units, 5 Units, Subcutaneous, QHS, Richarda Overlie, MD, 5 Units at 02/10/16 2103 .  isosorbide mononitrate (IMDUR) 24 hr tablet 30 mg, 30 mg, Oral, Daily, Lavone Neri Opyd, MD, 30 mg at 02/11/16 1059 .  labetalol (NORMODYNE,TRANDATE) injection 10 mg, 10 mg, Intravenous, Q10 min PRN, Briscoe Deutscher, MD, 10 mg at 02/09/16 1834 .  lisinopril (PRINIVIL,ZESTRIL) tablet 20 mg, 20 mg, Oral, Daily, Lewayne Bunting, MD, 20 mg at 02/11/16 1059 .  magnesium oxide (MAG-OX) tablet 400 mg, 400 mg, Oral, BID, Richarda Overlie, MD, 400 mg at 02/11/16 1059 .  multivitamin with minerals tablet 1 tablet, 1 tablet, Oral, Daily, Briscoe Deutscher, MD, 1 tablet at 02/11/16 1059 .  ondansetron (ZOFRAN) injection 4 mg, 4 mg, Intravenous, Q6H PRN, Lavone Neri Opyd, MD, 4 mg at 02/08/16 1010 .  pantoprazole (PROTONIX) EC tablet 40 mg, 40 mg, Oral, Daily, Richarda Overlie, MD, 40 mg at 02/11/16 1059 .  polyethylene glycol (MIRALAX / GLYCOLAX) packet 17 g, 17 g, Oral, Daily PRN, Lavone Neri Opyd, MD .  sodium chloride flush (NS) 0.9 % injection 3 mL, 3 mL, Intravenous, Q12H, Lavone Neri Opyd, MD, 3 mL at 02/11/16 1059 .  sodium chloride flush (NS) 0.9 % injection 3 mL, 3 mL, Intravenous, PRN, Briscoe Deutscher, MD .  sulfaSALAzine (AZULFIDINE) tablet 2,000 mg, 2,000 mg, Oral, BID, Lavone Neri Opyd, MD, 2,000 mg at 02/11/16 1059 .  tamsulosin (FLOMAX) capsule 0.4 mg, 0.4 mg, Oral, QHS, Lavone Neri Opyd, MD, 0.4 mg at 02/10/16 2104  Patients Current Diet: DIET - DYS 1 Room service appropriate? 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 Walker  Prior Functional Level Prior Function Level of Independence: Independent with assistive device(s) Comments: pt used  RW, but declining health  Self Care: Did the patient need help bathing, dressing, using the toilet or eating?  Independent  Indoor Mobility: Did the patient need assistance with walking from room to room (with or without device)? Independent  Stairs: Did the patient need assistance with internal or external stairs (with or without device)? Independent  Functional Cognition: Did the patient need help planning regular tasks such as shopping or remembering to take medications? Independent  Current Functional Level Cognition  Arousal/Alertness: Awake/alert (somewhat drowsy) Overall Cognitive Status: Impaired/Different from baseline Current Attention Level: Sustained Orientation Level: Oriented to person Following Commands: Follows one step commands consistently, Follows multi-step commands inconsistently, Follows multi-step commands with increased time Safety/Judgement: Decreased awareness of safety, Decreased awareness of deficits General Comments: Intermittent attention to RUE today.   Attention: Sustained Sustained Attention: Impaired Sustained Attention Impairment: Verbal basic, Functional basic Memory:  (will assess further) Awareness: Impaired Awareness Impairment: Intellectual impairment, Emergent impairment, Anticipatory impairment Problem Solving: Impaired Problem Solving Impairment: Verbal basic, Functional basic Safety/Judgment: Impaired    Extremity Assessment (includes Sensation/Coordination)  Upper Extremity Assessment: RUE deficits/detail RUE Deficits / Details: activation present of R UE when R arm is in visual field. pt in supine able to bring elbow to mouth, active movement bicep and tricep , Brunstrom III grasp. pt opening closing digits but with max cues and L UE at the same time. Pt demonstrates strong inattention to R side RUE Sensation: decreased light touch, decreased proprioception RUE Coordination: decreased fine motor, decreased gross motor  Lower Extremity  Assessment: Defer to PT evaluation RLE Deficits / Details: right leg at least 3/5 knee extension, but in standing he hyperextends it.  He is able to move it both forward and backward with weakness throughout.      ADLs  Overall ADL's : Needs assistance/impaired Eating/Feeding: Set up, Sitting Eating/Feeding Details (indicate cue type and reason): cues to beware of drinking coffee it was hot Grooming: Wash/dry face, Minimal assistance, Sitting Upper Body Bathing: Moderate assistance, Sitting Lower Body Bathing: Maximal assistance, Sitting/lateral leans Lower Body Bathing Details (indicate cue type and reason): Max assist for donning/doffing socks. Able to  Upper Body Dressing : Moderate assistance Upper Body Dressing Details (indicate cue type and reason): cues to dress R UE Lower Body Dressing: Maximal assistance, Sitting/lateral leans Lower Body Dressing Details (indicate cue type and reason): 2-3 plus pitting edema present in feet Toilet Transfer: Moderate assistance, +2 for physical assistance, +2 for safety/equipment, Ambulation, RW Toilet Transfer Details (indicate cue type and reason): Pt required VC's and tactile  cues to don  Functional mobility during ADLs: Moderate assistance, +2 for physical assistance, +2 for safety/equipment, Rolling walker General ADL Comments: Pt demonstrated Brunstrom level III approaching IV and able to demonstrate slight active extension but this is not functional.Upon standing, pt began to urinate for lengthy period of time. He was able to don/doff socks with max assist while sitting. With VC's he was able to lift RUE for UB dressing tasks.    Mobility  Overal bed mobility: Needs Assistance Bed Mobility: Supine to Sit Rolling: Mod assist, Min assist Supine to sit: Supervision General bed mobility comments: Supervision for safety, pt coming out of right side of bed and when given extra time, and use of railing he was able to work his way up to sitting.      Transfers  Overall transfer level: Needs assistance Equipment used: Rolling walker (2 wheeled) Transfers: Sit to/from Stand Sit to Stand: +2 physical assistance, Mod assist Stand pivot transfers: +2 safety/equipment, Mod assist General transfer comment: Cues for hand placement assist to boost and facilitate use of RUE to push and reach.      Ambulation / Gait / Stairs / Wheelchair Mobility  Ambulation/Gait Ambulation/Gait assistance: Mod assist, +2 physical assistance (to facilitate use of RUE on RW.  ) Ambulation Distance (Feet): 80 Feet Assistive device: Rolling walker (2 wheeled) Gait Pattern/deviations: Step-through pattern, Trunk flexed, Decreased stride length, Shuffle General Gait Details: Pt remains to require assist with RUE.  Cues for sequencing and forward gaze, required increased assistance to turn.   Gait velocity: decreased Gait velocity interpretation: Below normal speed for age/gender    Posture / Balance Dynamic Sitting Balance Sitting balance - Comments: Pt needed assist in positioning his right hand flat on the bed as he had it curled under and was WB through his wrist  Balance Overall balance assessment: Needs assistance Sitting-balance support: Feet supported, Bilateral upper extremity supported Sitting balance-Leahy Scale: Fair Sitting balance - Comments: Pt needed assist in positioning his right hand flat on the bed as he had it curled under and was WB through his wrist  Standing balance support: Bilateral upper extremity supported Standing balance-Leahy Scale: Poor Standing balance comment: needs external assist in standing.     Special needs/care consideration BiPAP/CPAP yes CPM n/a Continuous Drip IV  N/a Dialysis  N/a Life Vest  N/a Oxygen  N/a Special Bed  N/a Trach Size  N/a Wound Vac (area)  N/a Skin very dry skin to lower extremities                             Bowel mgmt: incontinent LBM 1/3 Bladder mgmt: condom catheter Diabetic mgmt Hgb A1c  6.4 Durable POA for Health Care and Living Will provided by brother, Clovis Riley and placed to chart Centralized intake worksheet for VA pension claim to assist with cost of ALF provided for SW completion on CIR by brother provided    Previous Home Environment Living Arrangements: Alone  Lives With: Alone Available Help at Discharge:  (plan is for d/c to ALF or SNF at d/c) Type of Home: Apartment Home Layout: One level Home Access: Level entry Bathroom Shower/Tub: Tub/shower unit, Engineer, building services: Standard Bathroom Accessibility: Yes How Accessible: Accessible via walker Home Care Services: No Additional Comments: brother reports poor living conditions, hoarding, disawray that family was not aware of  Discharge Living Setting Plans for Discharge Living Setting:  (plans to d/c to SNF or ALF depending on  level of recovery io) Type of Home at Discharge: Skilled Nursing Facility (plans for SNF or ALF) Care Facility Name at Discharge: Brother prefers facility in [facility] area Does the patient have any problems obtaining your medications?: No  Social/Family/Support Systems Patient Roles: Parent (has 35 yo son who lives with his Mom in [place]) Solicitor Information: brother and sister in Social worker Anticipated Caregiver: care facility staff Anticipated Industrial/product designer Information: see above Ability/Limitations of Caregiver: brother works Engineer, structural Availability: Other (Comment) Discharge Plan Discussed with Primary Caregiver: Yes Is Caregiver In Agreement with Plan?: Yes Does Caregiver/Family have Issues with Lodging/Transportation while Pt is in Rehab?: No  Goals/Additional Needs Patient/Family Goal for Rehab: Mod I to supervision with PT, OT, and SLP Expected length of stay: [facility] 20 - 24 days prior to placement Special Service Needs: Brother is Medical POA and has provided document to chart Pt/Family Agrees to Admission and willing to participate: Yes Program Orientation  Provided & Reviewed with Pt/Caregiver Including Roles  & Responsibilities: Yes  Decrease burden of Care through IP rehab admission: Diet advancement, Decrease number of caregivers, Bowel and bladder program and Patient/family education. Plans to admit to decrease burden of care to discharge to ALF or SNF depending on level of care reached at that time.  Possible need for SNF placement upon discharge: expect ALF vs SNF placement in the [facility] area near his brother.  Patient Condition: This patient's medical and functional status has changed since the consult dated: 02/07/2016 in which the Rehabilitation Physician determined and documented that the patient's condition is appropriate for intensive rehabilitative care in an inpatient rehabilitation facility. See "History of Present Illness" (above) for medical update. Functional changes are: overall mod assist. Patient's medical and functional status update has been discussed with the Rehabilitation physician and patient remains appropriate for inpatient rehabilitation. Will admit to inpatient rehab today.  Preadmission Screen Completed By:  Clois Dupes, 02/11/2016 11:44 AM ______________________________________________________________________   Discussed status with Dr. Allena Katz on 02/11/2016 at  1143 and received telephone approval for admission today.  Admission Coordinator:  Clois Dupes, time 2130 Date 02/11/2016

## 2016-02-10 NOTE — Progress Notes (Signed)
RT NOTE:  Per report pt will let staff know when he is ready to put CPAP on .

## 2016-02-10 NOTE — Progress Notes (Signed)
Hypoglycemic Event  CBG: 63  Treatment: D50 IV 25 mL  Symptoms: None  Follow-up CBG: Time:5;04 CBG Result:107  Possible Reasons for Event: Inadequate meal intake  Comments/MD notified:Dr Gregor Hams, Carvell Hoeffner [place]

## 2016-02-10 NOTE — Progress Notes (Signed)
Dietary called regarding patients diet.  Notified of chicken allergy.  Dietary aware and will correct order.

## 2016-02-10 NOTE — Progress Notes (Signed)
[facility] PROGRESS NOTE  [name] ZOX:[id number] DOB: [birth year] DOA: 02/05/2016   PCP: [facility]     Assessment/Plan: Principal Problem:   Stroke (cerebrum) (HCC) Active Problems:   AKI (acute kidney injury) (HCC)   Essential hypertension   Acute on chronic diastolic CHF (congestive heart failure) (HCC)   Anemia of chronic disease   Diabetes mellitus, type II, insulin dependent (HCC)   OSA ? compliacne   Hypokalemia   Hypertensive urgency   Acute CVA (cerebrovascular accident) (HCC)   Cerebral infarction due to occlusion of left middle cerebral artery (HCC)   Hypertension   Right sided weakness   Acute on chronic congestive heart failure (HCC)   56 y.o.malewith medical history significant for chronic CHF, insulin-dependent diabetes mellitus, hypertension, depression, ulcerative colitis, prior CVA with minimal residual right-sided weakness, and obstructive sleep apnea who presents to the emergency department with dysarthria, confusion, lethargy, and right facial droop. Medical admission for further evaluation and management of acute CVA was advised. Patient will be admitted to the telemetry unit for ongoing evaluation and management of acute CVA.He did not receive IV t-PA due to being outside the window  MRI brain shows Acute small to moderate LEFT frontal lobe/ MCA territory infarct corresponding to perfusion abnormality.  Assessment and plan 1. Stroke:  Dominant left frontal/MCA territory infarct embolic pattern secondary to unknown source. Patient does have multiple stroke risk factors, however, cardiac embolic infarct cannot be ruled out.  Resultant  expressive aphasia, right hemiparesis  CTA head and neck L M3 occlusion, R pleural effusion, pulmonary edema.  CTP small to moderate size L MCA territory infarct  MRI  Small to moderate L frontal/MCA territory infarct. Old B BG, pontomedullary, R occipital/PCA infarcts.  2D Echo LV EF:  35%.Diffuse hypokinesis-cardiology consulted for further recommendations for CHF, TEE negative for embolic source.   loop recorder placed 02/08/2016  LE doppler no DVT  LDL 94  HgbA1c 6.4     Lovenox 40 mg sq daily for VTE prophylaxis  Diet Diet recommendations: Thin liquid;Dysphagia 1 (puree  No antithrombotic prior to admission, now on aspirin 300 mg suppository daily. Due to intracranial stenosis, neurology recommends DAPT with aspirin and Plavix for 3 months and then Plavix alone.  Therapy recommendations:   CIR  Disposition:   CIR Monday   2. Acute on chronic systolic/diastolic CHF  - Initially found to have BLE edema, JVD, crackles, BNP 3370, CXR with interstitial edema  - TTE (04/13/15) with EF 50-55%,  - He had Lasix, Bumex, and Zaroxolyn, Aldactone, Coreg, and lisinopril on home med list EF down to 35%  , . Body weight down from 240>214 lbs  would continue with IV diuresis given elevated filling pressures on ECHO and marked LE edema.  Continue Lasix 80 mg IV twice a day, creatinine is stable,Would continue present dose of Lasix and follow renal function. May be able to reduce dose in AM.  Continue Coreg, lisinopril, Imdur, IV Lasix  Increase lisinopril to 20 mg daily. Titrate medications as tolerated. Repeat echocardiogram 3 months. If LV function remains decreased would need ischemia evaluation likely catheterization  3. Hypertension with hypertensive urgency - BP 200/140 in ED, likely exacerbated by acute CHF and acute cerebral artery occlusion  - Given the acute ischemic CVA, will treat only for SBP >220 or DBP >110 for now in acute phase    4. Insulin-dependent DM  - A1c was 5.9% in 2016. Accu-Chek stable - Reduced dose of  Lantus        5. Hypokalemia - Serum potassium 3.3 on admission , replete - Monitor on telemetry  - Follow BMP qAM during diuresis    6. Normocytic anemia  - Hgb 12.1 on admission  - Stable relative to priors with no sign of bleeding    7. OSA - CPAP qHS     DVT prophylaxsis Lovenox  Code Status:  Full code    Family Communication: Discussed in detail with the patient/brother, all imaging results, lab results explained to the patient   Disposition Plan:  Continue IV diuresis, CIR likely Monday     Consultants:  Neurology  Cardiology  Procedures:  None  Antibiotics: Anti-infectives    None         HPI/Subjective: Aphasic; shakes head no to CP or dyspnea; no pain otherwise.  Objective: Vitals:   02/09/16 1900 02/09/16 2032 02/10/16 0100 02/10/16 0500  BP: (!) 171/97 (!) 187/104 (!) 143/93 (!) 153/84  Pulse:   73 84  Resp:  20 20 20   Temp:  98.5 F (36.9 C) 98 F (36.7 C) 97.7 F (36.5 C)  TempSrc:  Oral Oral Oral  SpO2:  100% 95% 94%  Weight:    97.1 kg (214 lb)  Height:        Intake/Output Summary (Last 24 hours) at 02/10/16 0933 Last data filed at 02/10/16 0600  Gross per 24 hour  Intake              360 ml  Output             1400 ml  Net            -1040 ml    Exam:  Examination:  General exam: Appears calm and comfortable  Respiratory system: Clear to auscultation. Respiratory effort normal. Cardiovascular system: S1 & S2 heard, RRR. No JVD, murmurs, rubs, gallops or clicks. No pedal edema. Gastrointestinal system: Abdomen is nondistended, soft and nontender. No organomegaly or masses felt. Normal bowel sounds heard. Central nervous system: sleepy drowsy, but arousable and following some commands. Still aphasic with right-sided weakness      Data Reviewed: I have personally reviewed following labs and imaging studies  Micro Results Recent Results (from the past 240 hour(s))  Blood culture (routine x 2)     Status: None (Preliminary result)   Collection Time: 02/05/16  3:32 PM  Result Value Ref Range Status   Specimen Description BLOOD LEFT HAND AEROBIC BOTTLE ONLY  Final   Special Requests 5CC  Final   Culture NO GROWTH 4 DAYS  Final   Report Status  PENDING  Incomplete  Blood culture (routine x 2)     Status: None (Preliminary result)   Collection Time: 02/05/16  3:37 PM  Result Value Ref Range Status   Specimen Description RIGHT ANTECUBITAL AEROBIC BOTTLE ONLY  Final   Special Requests 5CC  Final   Culture NO GROWTH 4 DAYS  Final   Report Status PENDING  Incomplete  Urine culture     Status: None   Collection Time: 02/05/16  4:24 PM  Result Value Ref Range Status   Specimen Description URINE, CATHETERIZED  Final   Special Requests NONE  Final   Culture NO GROWTH  Final   Report Status 02/06/2016 FINAL  Final    Radiology Reports Ct Angio Head W Or Wo Contrast  Result Date: 02/05/2016 CLINICAL DATA:  Initial evaluation for acute right-sided weakness, slurred speech, aphasia. EXAM: CT ANGIOGRAPHY HEAD  AND NECK TECHNIQUE: Multidetector CT imaging of the head and neck was performed using the standard protocol during bolus administration of intravenous contrast. Multiplanar CT image reconstructions and MIPs were obtained to evaluate the vascular anatomy. Carotid stenosis measurements (when applicable) are obtained utilizing NASCET criteria, using the distal internal carotid diameter as the denominator. CONTRAST:  90 cc of Isovue 370. COMPARISON:  Prior CT from earlier the same day. FINDINGS: CT HEAD FINDINGS Brain: Atrophy with chronic microvascular ischemic disease again noted. Remote lacunar infarcts within the bilateral basal ganglia. Now evident is subtle evolving hypodensity involving the left insular cortex extending towards the left frontal operculum (series 201, image 18). This is more evident as compared to previous examination performed earlier the same day. No significant mass effect. No acute intracranial hemorrhage. Hyperdensity at the level of the left sylvian fissure suspicious for thrombus (series 201, image 19). No mass lesion, midline shift, or mass effect. No hydrocephalus. No extra-axial fluid collection. Vascular: Vascular  calcifications within the carotid siphons. Hyperdensity at the level the left sylvian fissure suspicious for thrombus (series 201, image 19). No other hyperdense vessel. Skull: Scalp soft tissues within normal limits.  Calvarium intact. Sinuses: Globes and orbital soft tissues within normal limits. Patient is status post scleral banding on the left. Mild opacity within the right ethmoidal air cells. Paranasal sinuses are otherwise clear. No mastoid effusion. CTA NECK FINDINGS Aortic arch: Visualized aortic arch of normal caliber with normal branch pattern. Scattered atheromatous plaque within the arch itself and about the origin of the great vessels without flow-limiting stenosis. Visualized subclavian arteries are widely patent. Right carotid system: Right common carotid artery patent from its origin to the bifurcation. Scattered calcified plaque about the right bifurcation without flow-limiting stenosis. Right ICA patent distally to the skullbase without stenosis, dissection, or occlusion. Left carotid system: Left common carotid artery patent from its origin to the bifurcation. Scattered calcified plaque about the left bifurcation without flow-limiting stenosis. Left ICA patent distally to the skullbase without stenosis, dissection, or occlusion. Vertebral arteries: Both of the vertebral arteries arise from the subclavian arteries. The the vertebral arteries widely patent without stenosis, dissection, or occlusion. Focal plaque noted at the origin of the right vertebral artery without high-grade stenosis. Skeleton: No acute osseous abnormality. No worrisome lytic or blastic osseous lesions. Other neck: Visualized soft tissues of the neck demonstrate no acute abnormality. Upper chest: Visualized upper mediastinum within normal limits. Layering right pleural effusion. Probable smaller left effusion. Scattered interlobular septal thickening within the partially visualized lungs, suggesting edema. Review of the MIP  images confirms the above findings CTA HEAD FINDINGS Anterior circulation: Petrous segments patent bilaterally. Scattered calcified atheromatous plaque within the cavernous/ supraclinoid ICAs with mild to moderate multifocal narrowing. ICA termini patent. Left A1 segment dominant and widely patent. Right A1 segment hypoplastic but patent as well. Anterior communicating artery normal. Anterior cerebral arteries patent to their distal aspects. Right M1 segment patent without stenosis or occlusion. Right MCA bifurcation normal. Right MCA branches well opacified to their distal aspects. Left M1 segment patent without stenosis or occlusion. Left MCA bifurcation normal. No proximal M2 branch occlusion. There is abrupt occlusion of a left M3 branch, middle division (series 602, image 80). This corresponds with previously seen hyperdensity on noncontrast portion of this exam. No other arterial branch occlusion. Left MCA branches otherwise patent. Posterior circulation: Scattered plaque within the V4 segments bilaterally, right greater than left with mild multifocal narrowing. Posterior inferior cerebral arteries patent bilaterally. Basilar artery widely  patent. Superior cerebellar arteries patent bilaterally. Both of the posterior cerebral arteries supplied mainly via the basilar artery and are patent to their distal aspects. Venous sinuses: Patent. Anatomic variants: No significant anatomic variant. No aneurysm or vascular malformation. Delayed phase: Not performed. Review of the MIP images confirms the above findings IMPRESSION: 1. 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 2. Scattered atheromatous plaque throughout the carotid siphons with mild to moderate multifocal narrowing. 3. Atheromatous plaque about the carotid bifurcations bilaterally without flow-limiting stenosis. 4. Large layering right pleural effusion, with smaller left pleural effusion.  Interlobular septal thickening suggestive of pulmonary edema. Critical Value/emergent results were called by telephone at the time of interpretation on 02/05/2016 at 6:19 pm to Dr. Ritta Slot , who verbally acknowledged these results. Electronically Signed   By: Rise Mu M.D.   On: 02/05/2016 19:04   Ct Head Wo Contrast  Result Date: 02/05/2016 CLINICAL DATA:  Altered mental status, aphasia, right-sided weakness EXAM: CT HEAD WITHOUT CONTRAST TECHNIQUE: Contiguous axial images were obtained from the base of the skull through the vertex without intravenous contrast. COMPARISON:  MRI 05/19/2000 FINDINGS: Brain: Old lacunar infarct in the left basal ganglia. Old infarct and encephalomalacia in the right occipital lobe. No acute intracranial abnormality. Specifically, no hemorrhage, hydrocephalus, mass lesion, acute infarction, or significant intracranial injury. Vascular: No hyperdense vessel or unexpected calcification. Skull: No acute calvarial abnormality. Sinuses/Orbits: Visualized paranasal sinuses and mastoids clear. Orbital soft tissues unremarkable. Other: None IMPRESSION: Old left basal ganglia and right occipital infarct. No acute intracranial abnormality. Electronically Signed   By: Charlett Nose M.D.   On: 02/05/2016 15:25   Ct Angio Neck W Or Wo Contrast  Result Date: 02/05/2016 CLINICAL DATA:  Initial evaluation for acute right-sided weakness, slurred speech, aphasia. EXAM: CT ANGIOGRAPHY HEAD AND NECK TECHNIQUE: Multidetector CT imaging of the head and neck was performed using the standard protocol during bolus administration of intravenous contrast. Multiplanar CT image reconstructions and MIPs were obtained to evaluate the vascular anatomy. Carotid stenosis measurements (when applicable) are obtained utilizing NASCET criteria, using the distal internal carotid diameter as the denominator. CONTRAST:  90 cc of Isovue 370. COMPARISON:  Prior CT from earlier the same day. FINDINGS:  CT HEAD FINDINGS Brain: Atrophy with chronic microvascular ischemic disease again noted. Remote lacunar infarcts within the bilateral basal ganglia. Now evident is subtle evolving hypodensity involving the left insular cortex extending towards the left frontal operculum (series 201, image 18). This is more evident as compared to previous examination performed earlier the same day. No significant mass effect. No acute intracranial hemorrhage. Hyperdensity at the level of the left sylvian fissure suspicious for thrombus (series 201, image 19). No mass lesion, midline shift, or mass effect. No hydrocephalus. No extra-axial fluid collection. Vascular: Vascular calcifications within the carotid siphons. Hyperdensity at the level the left sylvian fissure suspicious for thrombus (series 201, image 19). No other hyperdense vessel. Skull: Scalp soft tissues within normal limits.  Calvarium intact. Sinuses: Globes and orbital soft tissues within normal limits. Patient is status post scleral banding on the left. Mild opacity within the right ethmoidal air cells. Paranasal sinuses are otherwise clear. No mastoid effusion. CTA NECK FINDINGS Aortic arch: Visualized aortic arch of normal caliber with normal branch pattern. Scattered atheromatous plaque within the arch itself and about the origin of the great vessels without flow-limiting stenosis. Visualized subclavian arteries are widely patent. Right carotid system: Right common carotid  artery patent from its origin to the bifurcation. Scattered calcified plaque about the right bifurcation without flow-limiting stenosis. Right ICA patent distally to the skullbase without stenosis, dissection, or occlusion. Left carotid system: Left common carotid artery patent from its origin to the bifurcation. Scattered calcified plaque about the left bifurcation without flow-limiting stenosis. Left ICA patent distally to the skullbase without stenosis, dissection, or occlusion. Vertebral  arteries: Both of the vertebral arteries arise from the subclavian arteries. The the vertebral arteries widely patent without stenosis, dissection, or occlusion. Focal plaque noted at the origin of the right vertebral artery without high-grade stenosis. Skeleton: No acute osseous abnormality. No worrisome lytic or blastic osseous lesions. Other neck: Visualized soft tissues of the neck demonstrate no acute abnormality. Upper chest: Visualized upper mediastinum within normal limits. Layering right pleural effusion. Probable smaller left effusion. Scattered interlobular septal thickening within the partially visualized lungs, suggesting edema. Review of the MIP images confirms the above findings CTA HEAD FINDINGS Anterior circulation: Petrous segments patent bilaterally. Scattered calcified atheromatous plaque within the cavernous/ supraclinoid ICAs with mild to moderate multifocal narrowing. ICA termini patent. Left A1 segment dominant and widely patent. Right A1 segment hypoplastic but patent as well. Anterior communicating artery normal. Anterior cerebral arteries patent to their distal aspects. Right M1 segment patent without stenosis or occlusion. Right MCA bifurcation normal. Right MCA branches well opacified to their distal aspects. Left M1 segment patent without stenosis or occlusion. Left MCA bifurcation normal. No proximal M2 branch occlusion. There is abrupt occlusion of a left M3 branch, middle division (series 602, image 80). This corresponds with previously seen hyperdensity on noncontrast portion of this exam. No other arterial branch occlusion. Left MCA branches otherwise patent. Posterior circulation: Scattered plaque within the V4 segments bilaterally, right greater than left with mild multifocal narrowing. Posterior inferior cerebral arteries patent bilaterally. Basilar artery widely patent. Superior cerebellar arteries patent bilaterally. Both of the posterior cerebral arteries supplied mainly via  the basilar artery and are patent to their distal aspects. Venous sinuses: Patent. Anatomic variants: No significant anatomic variant. No aneurysm or vascular malformation. Delayed phase: Not performed. Review of the MIP images confirms the above findings IMPRESSION: 1. 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 2. Scattered atheromatous plaque throughout the carotid siphons with mild to moderate multifocal narrowing. 3. Atheromatous plaque about the carotid bifurcations bilaterally without flow-limiting stenosis. 4. Large layering right pleural effusion, with smaller left pleural effusion. Interlobular septal thickening suggestive of pulmonary edema. Critical Value/emergent results were called by telephone at the time of interpretation on 02/05/2016 at 6:19 pm to Dr. Ritta Slot , who verbally acknowledged these results. Electronically Signed   By: Rise Mu M.D.   On: 02/05/2016 19:04   Mr Brain Wo Contrast  Result Date: 02/06/2016 CLINICAL DATA:  Acute on chronic RIGHT-sided weakness, aphasia. Follow-up M3 occlusion. Assess stroke versus PRES. History of hypertension and diabetes. EXAM: MRI HEAD WITHOUT CONTRAST TECHNIQUE: Multiplanar, multiecho pulse sequences of the brain and surrounding structures were obtained without intravenous contrast. COMPARISON:  CT HEAD February 05, 2016 and MRI of the head May 20, 2015 FINDINGS: BRAIN: Confluent reduced diffusion measuring up to 5.8 cm in LEFT frontal lobe, involving the insula and operculum with low ADC values. Non contiguous patchy reduced diffusion LEFT anterior frontal lobe. No susceptibility artifact to suggest acute hemorrhage. The ventricles and sulci are normal for patient's age. Small area RIGHT occipital lobe encephalomalacia. Old LEFT pontomedullary  infarcts. Old bilateral basal ganglia lacunar infarcts. Symmetric basal ganglia susceptibility artifact most consistent  with old hemorrhage. Patchy supratentorial and pontine white matter FLAIR T2 hyperintensities. Mild ventriculomegaly on the basis of global parenchymal brain volume loss. No intraparenchymal mass effect, masses. No abnormal extra-axial fluid collections. VASCULAR: Normal major intracranial vascular flow voids present at skull base. SKULL AND UPPER CERVICAL SPINE: Empty sella. No suspicious calvarial bone marrow signal. Craniocervical junction maintained. SINUSES/ORBITS: Mild paranasal sinus mucosal thickening. The included ocular globes and orbital contents are non-suspicious. Status post LEFT scleral banding and ocular lens implant. OTHER: None. IMPRESSION: Acute small to moderate LEFT frontal lobe/ MCA territory infarct corresponding to perfusion abnormality. 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 Old basal ganglia and pontomedullary lacunar infarcts. Old RIGHT occipital lobe/ PCA territory infarct. Mild to moderate chronic small vessel ischemic disease. Mild atrophy. Electronically Signed   By: Awilda Metro M.D.   On: 02/06/2016 06:01   Ct Cerebral Perfusion W Contrast  Result Date: 02/05/2016 CLINICAL DATA:  Initial evaluation for acute right-sided weakness. EXAM: CT PERFUSION BRAIN TECHNIQUE: Multiphase CT imaging of the brain was performed following IV bolus contrast injection. Subsequent parametric perfusion maps were calculated using RAPID software. CONTRAST:  90 cc of Isovue 370. FINDINGS: CT Brain Perfusion Findings: CBF (<30%) Volume: 21 ccmL Perfusion (Tmax>6.0s) volume: 92 ccmL Mismatch Volume: 71 ccmL Infarction Location:Core infarct primarily involves the left frontal operculum/anterior left frontal lobe, with some extension towards the left insula and left temporal lobe. Please note that while the mismatch volume is measured as 71 cc of this exam, there is felt to be some artifact on the T max perfusion images, with elevated T-max seen not only around the core infarct, but also  within the right cerebral hemisphere as well as the right cerebellar hemisphere. Overall, while there is likely some penumbra about the area of core infarction, the recorded 71 cc is likely not accurate as to the volume of actual mismatch (which is likely significantly less, although difficult to accurately quantify on this exam). IMPRESSION: Acute small to moderate-sized left MCA territory infarct as above. Critical Value/emergent results were called by telephone at the time of interpretation on 02/05/2016 at 6:19 pm to Dr. Ritta Slot , who verbally acknowledged these results. Electronically Signed   By: Rise Mu M.D.   On: 02/05/2016 18:27   Dg Chest Port 1 View  Result Date: 02/05/2016 CLINICAL DATA:  Acute altered mental status. EXAM: PORTABLE CHEST 1 VIEW COMPARISON:  07/26/2015 FINDINGS: The patient has developed cardiomegaly with pulmonary vascular congestion and slight bilateral interstitial pulmonary edema. No acute bone abnormality. IMPRESSION: New cardiomegaly with pulmonary vascular congestion and slight bilateral interstitial pulmonary edema. Electronically Signed   By: Francene Boyers M.D.   On: 02/05/2016 16:01   Dg Swallowing Func-speech Pathology  Result Date: 02/06/2016 Objective Swallowing Evaluation: Type of Study: MBS-Modified Barium Swallow Study Patient Details Name: [name] MRN: [id number] Date of Birth: October 29, 1960 Today's Date: 02/06/2016 Time: SLP Start Time (ACUTE ONLY): 1017-SLP Stop Time (ACUTE ONLY): 1033 SLP Time Calculation (min) (ACUTE ONLY): 16 min Past Medical History: Past Medical History: Diagnosis Date . BPH (benign prostatic hyperplasia)  . CHF (congestive heart failure) (HCC)  . Diabetes mellitus without complication (HCC)  . Diabetic retinopathy associated with type 2 diabetes mellitus (HCC)  . Hypertension  . Lymphedema  . OSA (obstructive sleep apnea)  . TIA (transient ischemic attack)  Past Surgical History: Past Surgical History: Procedure  Laterality Date . BLADDER SURGERY   .  CARDIAC CATHETERIZATION   HPI: Rian Wassil a 56 y.o.malewith medical history significant for chronic CHF, insulin-dependent diabetes mellitus, hypertension, depression, OSA, ulcerative colitis, prior CVA with minimal residual right-sided weakness who presents to the emergency department with dysarthria, confusion, lethargy, and right facial droop. Patient lives alone and family resides in [facility], [place] and [place]. Chest x-ray is notable for new cardiomegaly with pulmonary vascular congestion and slight bilateral interstitial edema. MRI acute small to moderate LEFT frontal lobe/ MCA territory infarct corresponding to perfusion abnormality. 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 Old basal ganglia. MBS recommended following BSE. No Data Recorded Assessment / Plan / Recommendation [facility] IP CLINICAL IMPRESSIONS 02/06/2016 Therapy Diagnosis Moderate oral phase dysphagia;Mild pharyngeal phase dysphagia Clinical Impression Labial spillage, delayed transit and intermittent piecemeal pattern describe pt's oral function. Swallow initiation delayed to pyriform sinuses due to decreased sensation without penetration or aspiration with consecutive cup and straw sips and minimal vallecular residue. Upon clinical observation during bedside swallow and following MBS, pt has multiple swallows and intermittent mild throat clears with the appearance of difficulty however pt safe with consecutive larger sips. Risk for pocketing present therefore recommend Dys 2 texture and thin liquids, crush meds, straws allowed, sit upright and check right side oral cavity for pocketing. ST will continue to follow.     Impact on safety and function Mild aspiration risk   [facility] IP TREATMENT RECOMMENDATION 02/06/2016 Treatment Recommendations Therapy as outlined in treatment plan below   Prognosis 02/06/2016 Prognosis for Safe Diet Advancement Good Barriers to Reach Goals -- Barriers/Prognosis Comment  -- [facility] IP DIET RECOMMENDATION 02/06/2016 SLP Diet Recommendations Dysphagia 2 (Fine chop) solids;Thin liquid Liquid Administration via Straw;Cup Medication Administration Crushed with puree Compensations Slow rate;Small sips/bites;Minimize environmental distractions;Lingual sweep for clearance of pocketing Postural Changes Seated upright at 90 degrees   [facility] IP OTHER RECOMMENDATIONS 02/06/2016 Recommended Consults -- Oral Care Recommendations Oral care BID Other Recommendations --   [facility] IP FOLLOW UP RECOMMENDATIONS 02/06/2016 Follow up Recommendations Inpatient Rehab   [facility] IP FREQUENCY AND DURATION 02/06/2016 Speech Therapy Frequency (ACUTE ONLY) min 2x/week Treatment Duration 2 weeks      [facility] IP ORAL PHASE 02/06/2016 Oral Phase Impaired Oral - Pudding Teaspoon -- Oral - Pudding Cup -- Oral - Honey Teaspoon -- Oral - Honey Cup -- Oral - Nectar Teaspoon -- Oral - Nectar Cup -- Oral - Nectar Straw -- Oral - Thin Teaspoon -- Oral - Thin Cup Delayed oral transit;Piecemeal swallowing Oral - Thin Straw Delayed oral transit;Piecemeal swallowing Oral - Puree -- Oral - Mech Soft -- Oral - Regular Delayed oral transit;Weak lingual manipulation Oral - Multi-Consistency -- Oral - Pill -- Oral Phase - Comment --  [facility] IP PHARYNGEAL PHASE 02/06/2016 Pharyngeal Phase Impaired Pharyngeal- Pudding Teaspoon -- Pharyngeal -- Pharyngeal- Pudding Cup -- Pharyngeal -- Pharyngeal- Honey Teaspoon -- Pharyngeal -- Pharyngeal- Honey Cup -- Pharyngeal -- Pharyngeal- Nectar Teaspoon -- Pharyngeal -- Pharyngeal- Nectar Cup Delayed swallow initiation-pyriform sinuses Pharyngeal -- Pharyngeal- Nectar Straw -- Pharyngeal -- Pharyngeal- Thin Teaspoon -- Pharyngeal -- Pharyngeal- Thin Cup Delayed swallow initiation-pyriform sinuses;Pharyngeal residue - valleculae Pharyngeal -- Pharyngeal- Thin Straw Delayed swallow initiation-pyriform sinuses;Pharyngeal residue - valleculae Pharyngeal -- Pharyngeal- Puree -- Pharyngeal -- Pharyngeal- Mechanical Soft -- Pharyngeal  -- Pharyngeal- Regular WFL Pharyngeal -- Pharyngeal- Multi-consistency -- Pharyngeal -- Pharyngeal- Pill -- Pharyngeal -- Pharyngeal Comment --  [facility] IP CERVICAL ESOPHAGEAL PHASE 02/06/2016 Cervical Esophageal Phase WFL Pudding Teaspoon -- Pudding Cup -- Honey Teaspoon -- Honey Cup -- Nectar Teaspoon --  Nectar Cup -- Nectar Straw -- Thin Teaspoon -- Thin Cup -- Thin Straw -- Puree -- Mechanical Soft -- Regular -- Multi-consistency -- Pill -- Cervical Esophageal Comment -- No flowsheet data found. Royce Macadamia 02/06/2016, 11:14 AM Breck Coons Lonell Face.Ed CCC-SLP Pager [phone]                CBC  Recent Labs Lab 02/05/16 1531 02/05/16 1532 02/08/16 0710 02/10/16 0534  WBC  --  6.3 6.2 6.6  HGB 12.6* 12.1* 11.4* 12.5*  HCT 37.0* 37.0* 34.8* 38.9*  PLT  --  230 219 212  MCV  --  87.7 87.0 88.4  MCH  --  28.7 28.5 28.4  MCHC  --  32.7 32.8 32.1  RDW  --  16.1* 15.7* 16.1*  LYMPHSABS  --  1.0  --   --   MONOABS  --  0.3  --   --   EOSABS  --  0.1  --   --   BASOSABS  --  0.0  --   --     Chemistries   Recent Labs Lab 02/05/16 1532 02/06/16 0429 02/07/16 0730 02/08/16 0710 02/09/16 0724 02/10/16 0534  NA 143 145 143 143 143 143  K 3.3* 3.8 3.4* 3.5 3.6 3.2*  CL 111 110 110 107 107 104  CO2 25 24 28 28 28  32  GLUCOSE 105* 112* 70 86 84 107*  BUN 24* 23* 21* 17 18 17   CREATININE 1.48* 1.45* 1.58* 1.38* 1.43* 1.39*  CALCIUM 9.1 8.9 8.7* 8.7* 8.8* 8.8*  MG  --  1.9  --   --   --   --   AST 42*  --   --  27  --   --   ALT 27  --   --  18  --   --   ALKPHOS 227*  --   --  181*  --   --   BILITOT 0.9  --   --  0.6  --   --    ------------------------------------------------------------------------------------------------------------------ estimated creatinine clearance is 72.2 mL/min (by C-G formula based on SCr of 1.39 mg/dL (H)). ------------------------------------------------------------------------------------------------------------------ No results for input(s):  HGBA1C in the last 72 hours. ------------------------------------------------------------------------------------------------------------------ No results for input(s): CHOL, HDL, LDLCALC, TRIG, CHOLHDL, LDLDIRECT in the last 72 hours. ------------------------------------------------------------------------------------------------------------------ No results for input(s): TSH, T4TOTAL, T3FREE, THYROIDAB in the last 72 hours.  Invalid input(s): FREET3 ------------------------------------------------------------------------------------------------------------------ No results for input(s): VITAMINB12, FOLATE, FERRITIN, TIBC, IRON, RETICCTPCT in the last 72 hours.  Coagulation profile  Recent Labs Lab 02/05/16 1532  INR 1.18    No results for input(s): DDIMER in the last 72 hours.  Cardiac Enzymes No results for input(s): CKMB, TROPONINI, MYOGLOBIN in the last 168 hours.  Invalid input(s): CK ------------------------------------------------------------------------------------------------------------------ Invalid input(s): POCBNP   CBG:  Recent Labs Lab 02/09/16 2037 02/10/16 0014 02/10/16 0426 02/10/16 0504 02/10/16 0645  GLUCAP 97 82 63* 107* 99       Studies: No results found.    Lab Results  Component Value Date   HGBA1C 6.4 (H) 02/05/2016   Lab Results  Component Value Date   LDLCALC 94 02/06/2016   CREATININE 1.39 (H) 02/10/2016       Scheduled Meds: . aspirin EC  325 mg Oral Daily   Or  . aspirin  300 mg Rectal Daily  . atorvastatin  20 mg Oral QHS  . calcium-vitamin D  1 tablet Oral Daily  . carvedilol  50 mg Oral BID  .  citalopram  40 mg Oral Daily  . clopidogrel  75 mg Oral Daily  . doxazosin  4 mg Oral QHS  . enoxaparin (LOVENOX) injection  40 mg Subcutaneous Q24H  . furosemide  80 mg Intravenous BID  . insulin aspart  0-9 Units Subcutaneous Q2H while awake  . insulin glargine  5 Units Subcutaneous QHS  . isosorbide mononitrate  30 mg  Oral Daily  . lisinopril  20 mg Oral Daily  . magnesium oxide  400 mg Oral BID  . multivitamin with minerals  1 tablet Oral Daily  . pantoprazole  40 mg Oral Daily  . potassium chloride  40 mEq Oral BID  . sodium chloride flush  3 mL Intravenous Q12H  . sulfaSALAzine  2,000 mg Oral BID  . tamsulosin  0.4 mg Oral QHS   Continuous Infusions:   LOS: 5 days    Time spent: >30 MINS    [facility]  [facility] Pager [phone]. If 7PM-7AM, please contact night-coverage at [email or web address], password [facility] 02/10/2016, 9:33 AM  LOS: 5 days

## 2016-02-10 NOTE — Progress Notes (Signed)
Patient Name: [name] Date of Encounter: 02/10/2016  Primary Cardiologist: Dr [facility] Problem List     Principal Problem:   Stroke (cerebrum) [facility]) Active Problems:   AKI (acute kidney injury) (HCC)   Essential hypertension   Acute on chronic diastolic CHF (congestive heart failure) (HCC)   Anemia of chronic disease   Diabetes mellitus, type II, insulin dependent (HCC)   OSA ? compliacne   Hypokalemia   Hypertensive urgency   Acute CVA (cerebrovascular accident) (HCC)   Cerebral infarction due to occlusion of left middle cerebral artery (HCC)   Hypertension   Right sided weakness   Acute on chronic congestive heart failure (HCC)     Subjective   Aphasic; shakes head no to CP or dyspnea; no pain otherwise.  Inpatient Medications    Scheduled Meds: . aspirin EC  325 mg Oral Daily   Or  . aspirin  300 mg Rectal Daily  . atorvastatin  20 mg Oral QHS  . calcium-vitamin D  1 tablet Oral Daily  . carvedilol  50 mg Oral BID  . citalopram  40 mg Oral Daily  . clopidogrel  75 mg Oral Daily  . doxazosin  4 mg Oral QHS  . enoxaparin (LOVENOX) injection  40 mg Subcutaneous Q24H  . furosemide  80 mg Intravenous BID  . insulin aspart  0-9 Units Subcutaneous Q2H while awake  . insulin glargine  5 Units Subcutaneous QHS  . isosorbide mononitrate  30 mg Oral Daily  . lisinopril  10 mg Oral Daily  . magnesium oxide  400 mg Oral BID  . multivitamin with minerals  1 tablet Oral Daily  . pantoprazole  40 mg Oral Daily  . potassium chloride  40 mEq Oral Daily  . sodium chloride flush  3 mL Intravenous Q12H  . sulfaSALAzine  2,000 mg Oral BID  . tamsulosin  0.4 mg Oral QHS   Continuous Infusions:  PRN Meds: sodium chloride, acetaminophen, docusate sodium, labetalol, ondansetron (ZOFRAN) IV, polyethylene glycol, sodium chloride flush   Vital Signs    Vitals:   02/09/16 1900 02/09/16 2032 02/10/16 0100 02/10/16 0500  BP: (!) 171/97 (!) 187/104 (!) 143/93 (!)  153/84  Pulse:   73 84  Resp:  20 20 20   Temp:  98.5 F (36.9 C) 98 F (36.7 C) 97.7 F (36.5 C)  TempSrc:  Oral Oral Oral  SpO2:  100% 95% 94%  Weight:    214 lb (97.1 kg)  Height:        Intake/Output Summary (Last 24 hours) at 02/10/16 0900 Last data filed at 02/10/16 0600  Gross per 24 hour  Intake              360 ml  Output             1400 ml  Net            -1040 ml   Filed Weights   02/08/16 0319 02/09/16 0444 02/10/16 0500  Weight: 227 lb (103 kg) 222 lb 8 oz (100.9 kg) 214 lb (97.1 kg)    Physical Exam    GEN: Well nourished, well developed, in no acute distress.  HEENT: Grossly normal.  Neck: Supple Cardiac: RRR, 1+ lower ext edema (R> L) Respiratory:  CTA anteriorly GI: Soft, nontender, nondistended. MS: no deformity or atrophy. Skin: warm and dry, no rash. Neuro:  Aphasic, right side weak   Labs    CBC  Recent Labs  02/08/16 0710  02/10/16 0534  WBC 6.2 6.6  HGB 11.4* 12.5*  HCT 34.8* 38.9*  MCV 87.0 88.4  PLT 219 212   Basic Metabolic Panel  Recent Labs  02/09/16 0724 02/10/16 0534  NA 143 143  K 3.6 3.2*  CL 107 104  CO2 28 32  GLUCOSE 84 107*  BUN 18 17  CREATININE 1.43* 1.39*  CALCIUM 8.8* 8.8*   Liver Function Tests  Recent Labs  02/08/16 0710  AST 27  ALT 18  ALKPHOS 181*  BILITOT 0.6  PROT 5.1*  ALBUMIN 2.3*     Telemetry    Sinus with PACs and PVCs - Personally Reviewed  Patient Profile     56 yo male admitted with CVA; Echo showed newly reduced LV systolic function (EF 35); TEE showed no SOE; ILR placed. Also being treated for CHF and severe HTN.  Assessment & Plan    1 acute systolic congestive heart failure-patient remains volume overloaded but improving. Would continue present dose of Lasix and follow renal function. May be able to reduce dose in AM.   2 cardiomyopathy-etiology unclear. 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 Continue carvedilol. Increase lisinopril to 20  mg daily. Titrate medications as tolerated. Repeat echocardiogram 3 months. If LV function remains decreased would need ischemia evaluation likely catheterization. Would not pursue at this point given recent CVA.  3 hypertension-blood pressure remains elevated. Increase lisinopril and adjust regimen as needed.  4 chronic stage III kidney disease-follow renal function closely with above medication adjustments.  5 status post CVA-transesophageal echocardiogram showed no source of embolus. Implantable loop recorder has been placed. Further management per primary service.  6 Hypokalemia-supplement  Olga Millers, MD   Signed, Olga Millers, MD  02/10/2016, 9:00 AM

## 2016-02-11 ENCOUNTER — Inpatient Hospital Stay (HOSPITAL_COMMUNITY)
Admission: RE | Admit: 2016-02-11 | Discharge: 2016-02-27 | DRG: 056 | Disposition: A | Discharge status: Skilled Nursing Facility | Payer: Medicare Other | Source: Intra-hospital | Attending: Physical Medicine & Rehabilitation | Admitting: Physical Medicine & Rehabilitation

## 2016-02-11 ENCOUNTER — Encounter (HOSPITAL_COMMUNITY): Payer: Self-pay | Admitting: Internal Medicine

## 2016-02-11 DIAGNOSIS — K5190 Ulcerative colitis, unspecified, without complications: Secondary | ICD-10-CM | POA: Diagnosis not present

## 2016-02-11 DIAGNOSIS — R1310 Dysphagia, unspecified: Secondary | ICD-10-CM | POA: Diagnosis not present

## 2016-02-11 DIAGNOSIS — E8809 Other disorders of plasma-protein metabolism, not elsewhere classified: Secondary | ICD-10-CM | POA: Diagnosis not present

## 2016-02-11 DIAGNOSIS — G8194 Hemiplegia, unspecified affecting left nondominant side: Secondary | ICD-10-CM

## 2016-02-11 DIAGNOSIS — Z7982 Long term (current) use of aspirin: Secondary | ICD-10-CM

## 2016-02-11 DIAGNOSIS — N400 Enlarged prostate without lower urinary tract symptoms: Secondary | ICD-10-CM

## 2016-02-11 DIAGNOSIS — I69319 Unspecified symptoms and signs involving cognitive functions following cerebral infarction: Secondary | ICD-10-CM | POA: Diagnosis not present

## 2016-02-11 DIAGNOSIS — E1142 Type 2 diabetes mellitus with diabetic polyneuropathy: Secondary | ICD-10-CM

## 2016-02-11 DIAGNOSIS — I890 Lymphedema, not elsewhere classified: Secondary | ICD-10-CM | POA: Diagnosis not present

## 2016-02-11 DIAGNOSIS — E785 Hyperlipidemia, unspecified: Secondary | ICD-10-CM

## 2016-02-11 DIAGNOSIS — I69351 Hemiplegia and hemiparesis following cerebral infarction affecting right dominant side: Principal | ICD-10-CM

## 2016-02-11 DIAGNOSIS — I69322 Dysarthria following cerebral infarction: Secondary | ICD-10-CM | POA: Diagnosis not present

## 2016-02-11 DIAGNOSIS — I69391 Dysphagia following cerebral infarction: Secondary | ICD-10-CM

## 2016-02-11 DIAGNOSIS — I5043 Acute on chronic combined systolic (congestive) and diastolic (congestive) heart failure: Secondary | ICD-10-CM | POA: Diagnosis not present

## 2016-02-11 DIAGNOSIS — I10 Essential (primary) hypertension: Secondary | ICD-10-CM | POA: Diagnosis not present

## 2016-02-11 DIAGNOSIS — I130 Hypertensive heart and chronic kidney disease with heart failure and stage 1 through stage 4 chronic kidney disease, or unspecified chronic kidney disease: Secondary | ICD-10-CM | POA: Diagnosis not present

## 2016-02-11 DIAGNOSIS — R600 Localized edema: Secondary | ICD-10-CM

## 2016-02-11 DIAGNOSIS — F329 Major depressive disorder, single episode, unspecified: Secondary | ICD-10-CM

## 2016-02-11 DIAGNOSIS — I63512 Cerebral infarction due to unspecified occlusion or stenosis of left middle cerebral artery: Secondary | ICD-10-CM | POA: Diagnosis present

## 2016-02-11 DIAGNOSIS — N183 Chronic kidney disease, stage 3 unspecified: Secondary | ICD-10-CM

## 2016-02-11 DIAGNOSIS — G8191 Hemiplegia, unspecified affecting right dominant side: Secondary | ICD-10-CM

## 2016-02-11 DIAGNOSIS — I69390 Apraxia following cerebral infarction: Secondary | ICD-10-CM | POA: Diagnosis not present

## 2016-02-11 DIAGNOSIS — I63312 Cerebral infarction due to thrombosis of left middle cerebral artery: Secondary | ICD-10-CM

## 2016-02-11 DIAGNOSIS — Z794 Long term (current) use of insulin: Secondary | ICD-10-CM | POA: Diagnosis not present

## 2016-02-11 DIAGNOSIS — R4701 Aphasia: Secondary | ICD-10-CM

## 2016-02-11 DIAGNOSIS — F32A Depression, unspecified: Secondary | ICD-10-CM

## 2016-02-11 DIAGNOSIS — G4733 Obstructive sleep apnea (adult) (pediatric): Secondary | ICD-10-CM | POA: Diagnosis not present

## 2016-02-11 DIAGNOSIS — I69320 Aphasia following cerebral infarction: Secondary | ICD-10-CM

## 2016-02-11 DIAGNOSIS — Z8673 Personal history of transient ischemic attack (TIA), and cerebral infarction without residual deficits: Secondary | ICD-10-CM

## 2016-02-11 DIAGNOSIS — Z79899 Other long term (current) drug therapy: Secondary | ICD-10-CM | POA: Diagnosis not present

## 2016-02-11 DIAGNOSIS — R609 Edema, unspecified: Secondary | ICD-10-CM | POA: Diagnosis not present

## 2016-02-11 DIAGNOSIS — E11319 Type 2 diabetes mellitus with unspecified diabetic retinopathy without macular edema: Secondary | ICD-10-CM | POA: Diagnosis not present

## 2016-02-11 DIAGNOSIS — K51919 Ulcerative colitis, unspecified with unspecified complications: Secondary | ICD-10-CM

## 2016-02-11 LAB — CREATININE, SERUM
Creatinine, Ser: 1.59 mg/dL — ABNORMAL HIGH (ref 0.61–1.24)
GFR calc non Af Amer: 48 mL/min — ABNORMAL LOW (ref >60)
GFR, EST AFRICAN AMERICAN: 55 mL/min — AB (ref >60)

## 2016-02-11 LAB — GLUCOSE, CAPILLARY
GLUCOSE-CAPILLARY: 72 mg/dL (ref 65–99)
GLUCOSE-CAPILLARY: 68 mg/dL (ref 65–99)
GLUCOSE-CAPILLARY: 132 mg/dL — AB (ref 65–99)
GLUCOSE-CAPILLARY: 207 mg/dL — AB (ref 65–99)

## 2016-02-11 LAB — COMPREHENSIVE METABOLIC PANEL
ALT: 13 U/L — AB (ref 17–63)
AST: 24 U/L (ref 15–41)
Albumin: 2.1 g/dL — ABNORMAL LOW (ref 3.5–5.0)
Alkaline Phosphatase: 148 U/L — ABNORMAL HIGH (ref 38–126)
Anion gap: 7 (ref 5–15)
BILIRUBIN TOTAL: 0.5 mg/dL (ref 0.3–1.2)
BUN: 19 mg/dL (ref 6–20)
CO2: 32 mmol/L (ref 22–32)
CREATININE: 1.57 mg/dL — AB (ref 0.61–1.24)
Calcium: 8.4 mg/dL — ABNORMAL LOW (ref 8.9–10.3)
Chloride: 106 mmol/L (ref 101–111)
GFR calc Af Amer: 56 mL/min — ABNORMAL LOW (ref >60)
GFR, EST NON AFRICAN AMERICAN: 48 mL/min — AB (ref >60)
GLUCOSE: 67 mg/dL (ref 65–99)
Potassium: 3.6 mmol/L (ref 3.5–5.1)
Sodium: 145 mmol/L (ref 135–145)
TOTAL PROTEIN: 5 g/dL — AB (ref 6.5–8.1)

## 2016-02-11 LAB — CBC
HCT: 35.6 % — ABNORMAL LOW (ref 39.0–52.0)
HEMOGLOBIN: 11.3 g/dL — AB (ref 13.0–17.0)
MCH: 28.3 pg (ref 26.0–34.0)
MCHC: 31.7 g/dL (ref 30.0–36.0)
MCV: 89 fL (ref 78.0–100.0)
Platelets: 191 10*3/uL (ref 150–400)
RBC: 4 MIL/uL — AB (ref 4.22–5.81)
RDW: 15.9 % — ABNORMAL HIGH (ref 11.5–15.5)
WBC: 8.8 10*3/uL (ref 4.0–10.5)

## 2016-02-11 MED ORDER — POTASSIUM CHLORIDE 20 MEQ/15ML (10%) PO SOLN
40.0000 meq | Freq: Every day | ORAL | Status: DC
  Administered 2016-02-11 – 2016-02-14 (×4): 40 meq via ORAL
  Filled 2016-02-11 (×5): qty 30

## 2016-02-11 MED ORDER — LISINOPRIL 20 MG PO TABS
20.0000 mg | ORAL_TABLET | Freq: Every day | ORAL | Status: DC
  Administered 2016-02-12 – 2016-02-27 (×16): 20 mg via ORAL
  Filled 2016-02-11 (×16): qty 1

## 2016-02-11 MED ORDER — ATORVASTATIN CALCIUM 20 MG PO TABS
20.0000 mg | ORAL_TABLET | Freq: Every day | ORAL | Status: DC
  Administered 2016-02-11 – 2016-02-26 (×16): 20 mg via ORAL
  Filled 2016-02-11 (×16): qty 1

## 2016-02-11 MED ORDER — CLOPIDOGREL BISULFATE 75 MG PO TABS: 75.0000 mg | ORAL_TABLET | Freq: Every day | ORAL | 2 refills | Status: AC

## 2016-02-11 MED ORDER — ISOSORBIDE MONONITRATE ER 30 MG PO TB24
30.0000 mg | ORAL_TABLET | Freq: Every day | ORAL | Status: DC
  Administered 2016-02-12 – 2016-02-14 (×3): 30 mg via ORAL
  Filled 2016-02-11 (×5): qty 1

## 2016-02-11 MED ORDER — PANTOPRAZOLE SODIUM 40 MG PO TBEC
40.0000 mg | DELAYED_RELEASE_TABLET | Freq: Every day | ORAL | Status: DC
  Administered 2016-02-12 – 2016-02-14 (×3): 40 mg via ORAL
  Filled 2016-02-11 (×4): qty 1

## 2016-02-11 MED ORDER — DOXAZOSIN MESYLATE 4 MG PO TABS
4.0000 mg | ORAL_TABLET | Freq: Every day | ORAL | Status: DC
  Administered 2016-02-11 – 2016-02-26 (×16): 4 mg via ORAL
  Filled 2016-02-11 (×16): qty 1

## 2016-02-11 MED ORDER — SORBITOL 70 % SOLN
30.0000 mL | Freq: Every day | Status: DC | PRN
  Administered 2016-02-14 – 2016-02-16 (×2): 30 mL via ORAL
  Filled 2016-02-11 (×2): qty 30

## 2016-02-11 MED ORDER — ASPIRIN 300 MG RE SUPP: 300.0000 mg | Freq: Every day | RECTAL | Status: DC

## 2016-02-11 MED ORDER — MAGNESIUM OXIDE 400 (241.3 MG) MG PO TABS
400.0000 mg | ORAL_TABLET | Freq: Two times a day (BID) | ORAL | Status: DC
  Administered 2016-02-11 – 2016-02-26 (×31): 400 mg via ORAL
  Filled 2016-02-11 (×31): qty 1

## 2016-02-11 MED ORDER — MAGNESIUM OXIDE 400 (241.3 MG) MG PO TABS: 400.0000 mg | ORAL_TABLET | Freq: Two times a day (BID) | ORAL | 0 refills | Status: AC

## 2016-02-11 MED ORDER — FUROSEMIDE 80 MG PO TABS: 80.0000 mg | ORAL_TABLET | Freq: Every day | ORAL | 2 refills | Status: AC

## 2016-02-11 MED ORDER — ONDANSETRON HCL 4 MG PO TABS: 4.0000 mg | ORAL_TABLET | Freq: Four times a day (QID) | ORAL | Status: DC | PRN

## 2016-02-11 MED ORDER — CLOPIDOGREL BISULFATE 75 MG PO TABS
75.0000 mg | ORAL_TABLET | Freq: Every day | ORAL | Status: DC
  Administered 2016-02-12 – 2016-02-27 (×16): 75 mg via ORAL
  Filled 2016-02-11 (×16): qty 1

## 2016-02-11 MED ORDER — ONDANSETRON HCL 4 MG/2ML IJ SOLN: 4.0000 mg | Freq: Four times a day (QID) | INTRAMUSCULAR | Status: DC | PRN

## 2016-02-11 MED ORDER — INSULIN ASPART 100 UNIT/ML ~~LOC~~ SOLN
0.0000 [IU] | Freq: Three times a day (TID) | SUBCUTANEOUS | Status: DC
  Administered 2016-02-11: 2 [IU] via SUBCUTANEOUS
  Administered 2016-02-11 – 2016-02-12 (×2): 3 [IU] via SUBCUTANEOUS
  Administered 2016-02-14 (×2): 1 [IU] via SUBCUTANEOUS
  Administered 2016-02-14: 2 [IU] via SUBCUTANEOUS
  Administered 2016-02-15 – 2016-02-19 (×7): 1 [IU] via SUBCUTANEOUS
  Administered 2016-02-20: 2 [IU] via SUBCUTANEOUS
  Administered 2016-02-21: 1 [IU] via SUBCUTANEOUS
  Administered 2016-02-21: 2 [IU] via SUBCUTANEOUS
  Administered 2016-02-21: 1 [IU] via SUBCUTANEOUS
  Administered 2016-02-22: 2 [IU] via SUBCUTANEOUS
  Administered 2016-02-23: 1 [IU] via SUBCUTANEOUS
  Administered 2016-02-23: 2 [IU] via SUBCUTANEOUS
  Administered 2016-02-24 – 2016-02-26 (×5): 1 [IU] via SUBCUTANEOUS

## 2016-02-11 MED ORDER — ENOXAPARIN SODIUM 40 MG/0.4ML ~~LOC~~ SOLN
40.0000 mg | SUBCUTANEOUS | Status: DC
  Administered 2016-02-11 – 2016-02-26 (×16): 40 mg via SUBCUTANEOUS
  Filled 2016-02-11 (×16): qty 0.4

## 2016-02-11 MED ORDER — INSULIN GLARGINE 100 UNIT/ML ~~LOC~~ SOLN
5.0000 [IU] | Freq: Every day | SUBCUTANEOUS | Status: DC
  Administered 2016-02-11 – 2016-02-12 (×2): 5 [IU] via SUBCUTANEOUS
  Filled 2016-02-11 (×2): qty 0.05

## 2016-02-11 MED ORDER — CITALOPRAM HYDROBROMIDE 20 MG PO TABS
40.0000 mg | ORAL_TABLET | Freq: Every day | ORAL | Status: DC
  Administered 2016-02-12 – 2016-02-26 (×15): 40 mg via ORAL
  Filled 2016-02-11 (×15): qty 2

## 2016-02-11 MED ORDER — INSULIN GLARGINE 100 UNIT/ML ~~LOC~~ SOLN: 5.0000 [IU] | Freq: Every day | SUBCUTANEOUS | 11 refills | Status: AC

## 2016-02-11 MED ORDER — DOCUSATE SODIUM 100 MG PO CAPS: 100.0000 mg | ORAL_CAPSULE | Freq: Every day | ORAL | Status: DC | PRN

## 2016-02-11 MED ORDER — SULFASALAZINE 500 MG PO TABS
2000.0000 mg | ORAL_TABLET | Freq: Two times a day (BID) | ORAL | Status: DC
  Administered 2016-02-11 – 2016-02-26 (×31): 2000 mg via ORAL
  Filled 2016-02-11 (×34): qty 4

## 2016-02-11 MED ORDER — ASPIRIN EC 325 MG PO TBEC
325.0000 mg | DELAYED_RELEASE_TABLET | Freq: Every day | ORAL | Status: DC
  Administered 2016-02-12 – 2016-02-14 (×3): 325 mg via ORAL
  Filled 2016-02-11 (×4): qty 1

## 2016-02-11 MED ORDER — DOXAZOSIN MESYLATE 4 MG PO TABS: 4.0000 mg | ORAL_TABLET | Freq: Every day | ORAL | 0 refills | Status: AC

## 2016-02-11 MED ORDER — TAMSULOSIN HCL 0.4 MG PO CAPS
0.4000 mg | ORAL_CAPSULE | Freq: Every day | ORAL | Status: DC
  Administered 2016-02-11 – 2016-02-26 (×16): 0.4 mg via ORAL
  Filled 2016-02-11 (×16): qty 1

## 2016-02-11 MED ORDER — FUROSEMIDE 80 MG PO TABS: 80.0000 mg | ORAL_TABLET | Freq: Every day | ORAL | Status: DC

## 2016-02-11 MED ORDER — POTASSIUM CHLORIDE CRYS ER 20 MEQ PO TBCR: 40.0000 meq | EXTENDED_RELEASE_TABLET | Freq: Every day | ORAL | Status: DC

## 2016-02-11 MED ORDER — CALCIUM CARBONATE-VITAMIN D 500-200 MG-UNIT PO TABS
1.0000 | ORAL_TABLET | Freq: Every day | ORAL | Status: DC
  Administered 2016-02-12 – 2016-02-26 (×15): 1 via ORAL
  Filled 2016-02-11 (×15): qty 1

## 2016-02-11 MED ORDER — LISINOPRIL 20 MG PO TABS
20.0000 mg | ORAL_TABLET | Freq: Every day | ORAL | 2 refills | Status: AC
Start: 2016-02-11 — End: 2016-03-12

## 2016-02-11 MED ORDER — ADULT MULTIVITAMIN W/MINERALS CH
1.0000 | ORAL_TABLET | Freq: Every day | ORAL | Status: DC
Start: 2016-02-12 — End: 2016-02-27
  Administered 2016-02-12 – 2016-02-26 (×13): 1 via ORAL
  Filled 2016-02-11 (×15): qty 1

## 2016-02-11 MED ORDER — ACETAMINOPHEN 325 MG PO TABS: 650.0000 mg | ORAL_TABLET | ORAL | Status: DC | PRN

## 2016-02-11 MED ORDER — FUROSEMIDE 40 MG PO TABS
80.0000 mg | ORAL_TABLET | Freq: Two times a day (BID) | ORAL | Status: DC
  Administered 2016-02-11 – 2016-02-14 (×7): 80 mg via ORAL
  Filled 2016-02-11 (×3): qty 1
  Filled 2016-02-11: qty 2
  Filled 2016-02-11 (×2): qty 1
  Filled 2016-02-11: qty 2
  Filled 2016-02-11: qty 1

## 2016-02-11 MED ORDER — ENOXAPARIN SODIUM 40 MG/0.4ML ~~LOC~~ SOLN: 40.0000 mg | SUBCUTANEOUS | Status: DC

## 2016-02-11 MED ORDER — SPIRONOLACTONE 25 MG PO TABS: 25.0000 mg | ORAL_TABLET | Freq: Every day | ORAL | 0 refills | Status: AC

## 2016-02-11 MED ORDER — POTASSIUM CHLORIDE CRYS ER 20 MEQ PO TBCR: 40.0000 meq | EXTENDED_RELEASE_TABLET | Freq: Every day | ORAL | 0 refills | Status: AC

## 2016-02-11 MED ORDER — FUROSEMIDE 40 MG PO TABS: 80.0000 mg | ORAL_TABLET | Freq: Two times a day (BID) | ORAL | Status: DC

## 2016-02-11 MED ORDER — POLYETHYLENE GLYCOL 3350 17 G PO PACK
17.0000 g | PACK | Freq: Every day | ORAL | Status: DC | PRN
  Administered 2016-02-16: 17 g via ORAL
  Filled 2016-02-11 (×2): qty 1

## 2016-02-11 MED ORDER — CARVEDILOL 25 MG PO TABS
50.0000 mg | ORAL_TABLET | Freq: Two times a day (BID) | ORAL | Status: DC
  Administered 2016-02-11 – 2016-02-27 (×32): 50 mg via ORAL
  Filled 2016-02-11 (×32): qty 2

## 2016-02-11 MED ORDER — HYDRALAZINE HCL 100 MG PO TABS: 50.0000 mg | ORAL_TABLET | Freq: Three times a day (TID) | ORAL | 1 refills | Status: AC

## 2016-02-11 NOTE — Progress Notes (Signed)
Standley Brooking, RN Rehab Admission Coordinator Signed Physical Medicine and Rehabilitation  PMR Pre-admission Date of Service: 02/10/2016 1:46 PM  Related encounter: ED to Hosp-Admission (Discharged) from 02/05/2016 in [facility]       [] Hide copied text PMR Admission Coordinator Pre-Admission Assessment  Patient: [name] is an 56 y.o., male MRN: [id number] DOB: 1960/08/14 Height: 5\' 11"  (180.3 cm) Weight: 96.4 kg (212 lb 8 oz)                                                                                                                                                  Insurance Information HMO:     PPO:      PCP:      IPA:      80/20: yes     OTHER: no HMO PRIMARY: Medicare a and b      Policy#: [id number]      Subscriber: pt Benefits:  Phone #: Passport one online     Name: 02/06/2016 Eff. Date: a 01/04/08 b 08/04/2010     Deduct: $1340      Out of Pocket Max: none      Life Max: none CIR: 100%      SNF: 20 full days Outpatient: 80%     Co-Pay: 20% Home Health: 100%      Co-Pay: none DME: 80%     Co-Pay: 20% Providers: pt choice  SECONDARY: Sharen Counter      Policy#: [id number]      Subscriber: pt  Medicaid Application Date:       Case Manager:  Disability Application Date:       Case Worker:   Emergency Contact Information        Contact Information    Name Relation Home Work Mobile   Gadd,Mitchell Brother  [phone]   Jashon, Ishida   [phone]     Current Medical History  Patient Admitting Diagnosis: left frontal MCA infarct  History of Present Illness:   : HPI: [name] a 56 y.o.right handed malewith history of diastolic congestive heart failure,OSA,diabetes mellitus and peripheral neuropathy, hypertension, TIA maintained on aspirin, chronic renal insufficiency creatinine baseline 1.50-1.58. Presented 02/05/2016 with slurred speech, altered mental status and right-sided  weakness. He was hypertensive 200/140. EKG showed sinus rhythm with LAFB and LVH. Chest x-ray new cardiomegaly with pulmonary vascular congestion and slight bilateral interstitial edema. Cranial CT scan negative for acute changes. 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 CT cerebral perfusion showed acute small to moderate-sized left MCA territory infarct. Patient did not receive TPA. CT angiogram head and neck showed acute left M3 occlusion as well as scattered atheromatousplaque. MRI completed 02/07/2016 showed moderate left frontal lobe MCA territory infarct as well as old bilateral basal ganglia infarcts and right occipital lobe. Echocardiogram with ejection fraction  of 35% diffuse hypokinesis with grade 2 diastolic dysfunction. Venous Doppler studies negative for DVT. Maintained on aspirin forPlavix/CVA prophylaxis3 months then Plavix alone. Subcutaneous Lovenox for DVT prophylaxis. Dysphagia# 2thin liquid diets.TEE completed 02/08/2016 showing severe LV dysfunction with EF of 30% no thrombi and loop recorder was placed.   Cardiology consulted for acute systolic heart failure and newly reduced ejection fraction.: Plan outlined:    1 acute systolic congestive heart failure-patient remains volume overloaded but improving. Would continue present dose of Lasix and follow renal function. May be able to reduce dose in AM 02/11/16. 2 cardiomyopathy-etiology unclear. 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 Continue carvedilol. Increase lisinopril to 20 mg daily. Titrate medications as tolerated. Repeat echocardiogram 3 months. If LV function remains decreased would need ischemia evaluation likely catheterization. Would not pursue at this point given recent CVA. 3 hypertension-blood pressure remains elevated. Increase lisinopril and adjust regimen as needed. 4 chronic stage III kidney disease-follow renal function closely with above medication  adjustments.  Total: 7 NIHSS  Past Medical History      Past Medical History:  Diagnosis Date  . BPH (benign prostatic hyperplasia)   . CHF (congestive heart failure) (HCC)   . Diabetes mellitus without complication (HCC)   . Diabetic retinopathy associated with type 2 diabetes mellitus (HCC)   . Hypertension   . Lymphedema   . OSA (obstructive sleep apnea)   . TIA (transient ischemic attack)    Family History  family history includes Cancer in his father and mother.  Prior Rehab/Hospitalizations:  Has the patient had major surgery during 100 days prior to admission? No  Current Medications   Current Facility-Administered Medications:  .  0.9 %  sodium chloride infusion, 250 mL, Intravenous, PRN, Lavone Neri Opyd, MD .  acetaminophen (TYLENOL) tablet 650 mg, 650 mg, Oral, Q4H PRN, Briscoe Deutscher, MD .  aspirin EC tablet 325 mg, 325 mg, Oral, Daily, 325 mg at 02/11/16 1059 **OR** aspirin suppository 300 mg, 300 mg, Rectal, Daily, Marvel Plan, MD, 300 mg at 02/10/16 1036 .  atorvastatin (LIPITOR) tablet 20 mg, 20 mg, Oral, QHS, Lavone Neri Opyd, MD, 20 mg at 02/10/16 2104 .  calcium-vitamin D (OSCAL WITH D) 500-200 MG-UNIT per tablet 1 tablet, 1 tablet, Oral, Daily, Briscoe Deutscher, MD, 1 tablet at 02/11/16 1059 .  carvedilol (COREG) tablet 50 mg, 50 mg, Oral, BID, Janetta Hora, PA-C, 50 mg at 02/11/16 1059 .  citalopram (CELEXA) tablet 40 mg, 40 mg, Oral, Daily, Lavone Neri Opyd, MD, 40 mg at 02/11/16 1059 .  clopidogrel (PLAVIX) tablet 75 mg, 75 mg, Oral, Daily, Marvel Plan, MD, 75 mg at 02/11/16 1059 .  docusate sodium (COLACE) capsule 100 mg, 100 mg, Oral, Daily PRN, Lavone Neri Opyd, MD .  doxazosin (CARDURA) tablet 4 mg, 4 mg, Oral, QHS, Lavone Neri Opyd, MD, 4 mg at 02/10/16 2104 .  enoxaparin (LOVENOX) injection 40 mg, 40 mg, Subcutaneous, Q24H, Lavone Neri Opyd, MD, 40 mg at 02/10/16 2103 .  furosemide (LASIX) injection 80 mg, 80 mg, Intravenous, BID, Janetta Hora, PA-C, 80 mg at 02/11/16 0830 .  insulin aspart (novoLOG) injection 0-9 Units, 0-9 Units, Subcutaneous, TID AC & HS, Richarda Overlie, MD, 1 Units at 02/10/16 2208 .  insulin glargine (LANTUS) injection 5 Units, 5 Units, Subcutaneous, QHS, Richarda Overlie, MD, 5 Units at 02/10/16 2103 .  isosorbide mononitrate (IMDUR) 24 hr tablet 30 mg, 30 mg, Oral, Daily, Lavone Neri Opyd, MD, 30 mg at 02/11/16  1059 .  labetalol (NORMODYNE,TRANDATE) injection 10 mg, 10 mg, Intravenous, Q10 min PRN, Briscoe Deutscher, MD, 10 mg at 02/09/16 1834 .  lisinopril (PRINIVIL,ZESTRIL) tablet 20 mg, 20 mg, Oral, Daily, Lewayne Bunting, MD, 20 mg at 02/11/16 1059 .  magnesium oxide (MAG-OX) tablet 400 mg, 400 mg, Oral, BID, Richarda Overlie, MD, 400 mg at 02/11/16 1059 .  multivitamin with minerals tablet 1 tablet, 1 tablet, Oral, Daily, Briscoe Deutscher, MD, 1 tablet at 02/11/16 1059 .  ondansetron (ZOFRAN) injection 4 mg, 4 mg, Intravenous, Q6H PRN, Lavone Neri Opyd, MD, 4 mg at 02/08/16 1010 .  pantoprazole (PROTONIX) EC tablet 40 mg, 40 mg, Oral, Daily, Richarda Overlie, MD, 40 mg at 02/11/16 1059 .  polyethylene glycol (MIRALAX / GLYCOLAX) packet 17 g, 17 g, Oral, Daily PRN, Lavone Neri Opyd, MD .  sodium chloride flush (NS) 0.9 % injection 3 mL, 3 mL, Intravenous, Q12H, Lavone Neri Opyd, MD, 3 mL at 02/11/16 1059 .  sodium chloride flush (NS) 0.9 % injection 3 mL, 3 mL, Intravenous, PRN, Lavone Neri Opyd, MD .  sulfaSALAzine (AZULFIDINE) tablet 2,000 mg, 2,000 mg, Oral, BID, Lavone Neri Opyd, MD, 2,000 mg at 02/11/16 1059 .  tamsulosin (FLOMAX) capsule 0.4 mg, 0.4 mg, Oral, QHS, Lavone Neri Opyd, MD, 0.4 mg at 02/10/16 2104  Patients Current Diet: DIET - DYS 1 Room service appropriate? Yes; Fluid consistency: Thin Diet - low sodium heart healthy  Precautions / Restrictions Precautions Precautions: Fall Precaution Comments: watch BP, right sided weakness Restrictions Weight Bearing Restrictions: No   Has the patient had 2 or more  falls or a fall with injury in the past year?No  Prior Activity Level Limited Community (1-2x/wk): rode bus system, did not Higher education careers adviser / Equipment Home Assistive Devices/Equipment: Environmental consultant (specify type) Home Equipment: Walker - 2 wheels  Prior Device Use: Indicate devices/aids used by the patient prior to current illness, exacerbation or injury? Walker  Prior Functional Level Prior Function Level of Independence: Independent with assistive device(s) Comments: pt used RW, but declining health  Self Care: Did the patient need help bathing, dressing, using the toilet or eating?  Independent  Indoor Mobility: Did the patient need assistance with walking from room to room (with or without device)? Independent  Stairs: Did the patient need assistance with internal or external stairs (with or without device)? Independent  Functional Cognition: Did the patient need help planning regular tasks such as shopping or remembering to take medications? Independent  Current Functional Level Cognition Arousal/Alertness: Awake/alert (somewhat drowsy) Overall Cognitive Status: Impaired/Different from baseline Current Attention Level: Sustained Orientation Level: Oriented to person Following Commands: Follows one step commands consistently, Follows multi-step commands inconsistently, Follows multi-step commands with increased time Safety/Judgement: Decreased awareness of safety, Decreased awareness of deficits General Comments: Intermittent attention to RUE today.   Attention: Sustained Sustained Attention: Impaired Sustained Attention Impairment: Verbal basic, Functional basic Memory:  (will assess further) Awareness: Impaired Awareness Impairment: Intellectual impairment, Emergent impairment, Anticipatory impairment Problem Solving: Impaired Problem Solving Impairment: Verbal basic, Functional basic Safety/Judgment: Impaired    Extremity Assessment (includes  Sensation/Coordination) Upper Extremity Assessment: RUE deficits/detail RUE Deficits / Details: activation present of R UE when R arm is in visual field. pt in supine able to bring elbow to mouth, active movement bicep and tricep , Brunstrom III grasp. pt opening closing digits but with max cues and L UE at the same time. Pt demonstrates strong inattention to R side RUE Sensation: decreased light  touch, decreased proprioception RUE Coordination: decreased fine motor, decreased gross motor  Lower Extremity Assessment: Defer to PT evaluation RLE Deficits / Details: right leg at least 3/5 knee extension, but in standing he hyperextends it.  He is able to move it both forward and backward with weakness throughout.     ADLs Overall ADL's : Needs assistance/impaired Eating/Feeding: Set up, Sitting Eating/Feeding Details (indicate cue type and reason): cues to beware of drinking coffee it was hot Grooming: Wash/dry face, Minimal assistance, Sitting Upper Body Bathing: Moderate assistance, Sitting Lower Body Bathing: Maximal assistance, Sitting/lateral leans Lower Body Bathing Details (indicate cue type and reason): Max assist for donning/doffing socks. Able to  Upper Body Dressing : Moderate assistance Upper Body Dressing Details (indicate cue type and reason): cues to dress R UE Lower Body Dressing: Maximal assistance, Sitting/lateral leans Lower Body Dressing Details (indicate cue type and reason): 2-3 plus pitting edema present in feet Toilet Transfer: Moderate assistance, +2 for physical assistance, +2 for safety/equipment, Ambulation, RW Toilet Transfer Details (indicate cue type and reason): Pt required VC's and tactile cues to don  Functional mobility during ADLs: Moderate assistance, +2 for physical assistance, +2 for safety/equipment, Rolling walker General ADL Comments: Pt demonstrated Brunstrom level III approaching IV and able to demonstrate slight active extension but this is not  functional.Upon standing, pt began to urinate for lengthy period of time. He was able to don/doff socks with max assist while sitting. With VC's he was able to lift RUE for UB dressing tasks.   Mobility Overal bed mobility: Needs Assistance Bed Mobility: Supine to Sit Rolling: Mod assist, Min assist Supine to sit: Supervision General bed mobility comments: Supervision for safety, pt coming out of right side of bed and when given extra time, and use of railing he was able to work his way up to sitting.    Transfers Overall transfer level: Needs assistance Equipment used: Rolling walker (2 wheeled) Transfers: Sit to/from Stand Sit to Stand: +2 physical assistance, Mod assist Stand pivot transfers: +2 safety/equipment, Mod assist General transfer comment: Cues for hand placement assist to boost and facilitate use of RUE to push and reach.     Ambulation / Gait / Stairs / Wheelchair Mobility Ambulation/Gait Ambulation/Gait assistance: Mod assist, +2 physical assistance (to facilitate use of RUE on RW.  ) Ambulation Distance (Feet): 80 Feet Assistive device: Rolling walker (2 wheeled) Gait Pattern/deviations: Step-through pattern, Trunk flexed, Decreased stride length, Shuffle General Gait Details: Pt remains to require assist with RUE.  Cues for sequencing and forward gaze, required increased assistance to turn.   Gait velocity: decreased Gait velocity interpretation: Below normal speed for age/gender   Posture / Balance Dynamic Sitting Balance Sitting balance - Comments: Pt needed assist in positioning his right hand flat on the bed as he had it curled under and was WB through his wrist  Balance Overall balance assessment: Needs assistance Sitting-balance support: Feet supported, Bilateral upper extremity supported Sitting balance-Leahy Scale: Fair Sitting balance - Comments: Pt needed assist in positioning his right hand flat on the bed as he had it curled under and was WB through his wrist   Standing balance support: Bilateral upper extremity supported Standing balance-Leahy Scale: Poor Standing balance comment: needs external assist in standing.    Special needs/care consideration BiPAP/CPAP yes CPM n/a Continuous Drip IV  N/a Dialysis  N/a Life Vest  N/a Oxygen  N/a Special Bed  N/a Trach Size  N/a Wound Vac (area)  N/a Skin very dry skin  to lower extremities                             Bowel mgmt: incontinent LBM 1/3 Bladder mgmt: condom catheter Diabetic mgmt Hgb A1c 6.4 Durable POA for Health Care and Living Will provided by brother, Clovis Riley and placed to chart Centralized intake worksheet for VA pension claim to assist with cost of ALF provided for SW completion on CIR by brother provided    Previous Home Environment Living Arrangements: Alone  Lives With: Alone Available Help at Discharge:  (plan is for d/c to ALF or SNF at d/c) Type of Home: Apartment Home Layout: One level Home Access: Level entry Bathroom Shower/Tub: Tub/shower unit, Engineer, building services: Standard Bathroom Accessibility: Yes How Accessible: Accessible via walker Home Care Services: No Additional Comments: brother reports poor living conditions, hoarding, disawray that family was not aware of  Discharge Living Setting Plans for Discharge Living Setting:  (plans to d/c to SNF or ALF depending on level of recovery io) Type of Home at Discharge: Skilled Nursing Facility (plans for SNF or ALF) Care Facility Name at Discharge: Brother prefers facility in [facility] area Does the patient have any problems obtaining your medications?: No  Social/Family/Support Systems Patient Roles: Parent (has 63 yo son who lives with his Mom in [place]) Solicitor Information: brother and sister in Social worker Anticipated Caregiver: care facility staff Anticipated Industrial/product designer Information: see above Ability/Limitations of Caregiver: brother works Engineer, structural Availability: Other (Comment) Discharge  Plan Discussed with Primary Caregiver: Yes Is Caregiver In Agreement with Plan?: Yes Does Caregiver/Family have Issues with Lodging/Transportation while Pt is in Rehab?: No  Goals/Additional Needs Patient/Family Goal for Rehab: Mod I to supervision with PT, OT, and SLP Expected length of stay: ELOS 20 - 24 days prior to placement Special Service Needs: Brother is Medical POA and has provided document to chart Pt/Family Agrees to Admission and willing to participate: Yes Program Orientation Provided & Reviewed with Pt/Caregiver Including Roles  & Responsibilities: Yes  Decrease burden of Care through IP rehab admission: Diet advancement, Decrease number of caregivers, Bowel and bladder program and Patient/family education. Plans to admit to decrease burden of care to discharge to ALF or SNF depending on level of care reached at that time.  Possible need for SNF placement upon discharge: expect ALF vs SNF placement in the [facility] area near his brother.  Patient Condition: This patient's medical and functional status has changed since the consult dated: 02/07/2016 in which the Rehabilitation Physician determined and documented that the patient's condition is appropriate for intensive rehabilitative care in an inpatient rehabilitation facility. See "History of Present Illness" (above) for medical update. Functional changes are: overall mod assist. Patient's medical and functional status update has been discussed with the Rehabilitation physician and patient remains appropriate for inpatient rehabilitation. Will admit to inpatient rehab today.  Preadmission Screen Completed By:  Clois Dupes, 02/11/2016 11:44 AM ______________________________________________________________________   Discussed status with Dr. Allena Katz on 02/11/2016 at  1143 and received telephone approval for admission today.  Admission Coordinator:  Clois Dupes, time 1610 Date 02/11/2016        Cosigned by: Ankit Karis Juba, MD at 02/11/2016 11:46 AM  Revision History

## 2016-02-11 NOTE — Progress Notes (Addendum)
Patient arrived on unit with RN and NT. Loop recorder boxed, no set up needed per PA.  belongings packed into closet. Vitals within normal, patient denies pain. Patient oriented to unit. Resting comfortably.  Patient able to express himself if given adequate time. Expressed he is a fan of [place] food, eager to eat grape leaves and souvlaki when possible.

## 2016-02-11 NOTE — Progress Notes (Signed)
Patient's pre-lunch glucose reading 68.  Did not consume any breakfast.  Gave patient yogurt and orange magic cup with medications and subsequently fed patient lunch.  Will continue to monitor. Chrismer Radar

## 2016-02-11 NOTE — Progress Notes (Signed)
Pt medically ready and in agreement to admit to inpt rehab today. I have notified Dr. Susie Cassette and RN CM. I will contact pt's brother, to notify of CIR admission today. [phone]

## 2016-02-11 NOTE — Progress Notes (Signed)
Occupational Therapy Treatment Patient Details Name: [name] MRN: [id number] DOB: 1960-06-07 Today's Date: 02/11/2016    History of present illness 56 y.o. male admitted to [facility] on 02/05/16 for right facial droop and right sided weakness found to have a L MCA CVA.  Pt with significant PMHx of TIA, HTN, DM with diabetic retinopathy, and CHF.   OT comments  Pt progressing well toward OT goals. Pt with improved ability to initiate tasks with R hand this session as evidenced by attempts to scoot to EOB with R UE although he required assistance due to weakness. He required decreased assistance to maintain grasp on RW this session. Pt able to grasp sock from bed rail with R hand. Noted increased tightness in R digits this session but improving functional use of R UE. Pt able to release paper towel into trash can after oral care at sink. He required mod assist +2 for mobility. Pt remains an excellent candidate for CIR placement to improve independence with ADL.   Follow Up Recommendations  CIR    Equipment Recommendations  3 in 1 bedside commode    Recommendations for Other Services Rehab consult    Precautions / Restrictions Precautions Precautions: Fall Precaution Comments: right sided weakness Restrictions Weight Bearing Restrictions: No       Mobility Bed Mobility Overal bed mobility: Needs Assistance Bed Mobility: Supine to Sit Rolling: Min assist;+2 for physical assistance         General bed mobility comments: Cues for hand placement on rail and cues to reach with RUE.  Pt performed sit to supine with assist for trunk and assist with lifting B LEs against gravity.    Transfers Overall transfer level: Needs assistance Equipment used: Rolling walker (2 wheeled) Transfers: Sit to/from Stand Sit to Stand: +2 physical assistance;Mod assist Stand pivot transfers: +2 safety/equipment;Mod assist       General transfer comment: Cues for hand placement, patient initiates  pushing with RUE.  Poor strength in wrist requiring assist.      Balance Overall balance assessment: Needs assistance Sitting-balance support: Feet supported;Bilateral upper extremity supported Sitting balance-Leahy Scale: Fair Sitting balance - Comments: seated puttting socks on with assist from OT   Standing balance support: Bilateral upper extremity supported Standing balance-Leahy Scale: Poor Standing balance comment: needs external assist in standing.                    ADL Overall ADL's : Needs assistance/impaired     Grooming: Oral care;Moderate assistance;Standing (+2 assist)               Lower Body Dressing: Moderate assistance               Functional mobility during ADLs: Moderate assistance;+2 for physical assistance;+2 for safety/equipment;Rolling walker General ADL Comments: Increased tightness noted in R digits this session. Pt demonstrated Brunstrom level III approaching IV and able to demonstrate slight active extension but this is not functional.Upon standing, pt began to urinate for lengthy period of time. He was able to don/doff socks with max assist while sitting. With VC's he was able to lift RUE for UB dressing tasks.      Vision                     Perception     Praxis      Cognition   Behavior During Therapy: [facility] for tasks assessed/performed Overall Cognitive Status: Impaired/Different from baseline Area of Impairment: Attention;Following commands;Safety/judgement;Awareness;Problem solving  Current Attention Level: Sustained    Following Commands: Follows one step commands consistently;Follows multi-step commands inconsistently;Follows multi-step commands with increased time Safety/Judgement: Decreased awareness of safety;Decreased awareness of deficits Awareness: Intellectual Problem Solving: Difficulty sequencing;Requires verbal cues;Requires tactile cues General Comments: Improved attention to RUE today.       Extremity/Trunk Assessment               Exercises     Shoulder Instructions       General Comments      Pertinent Vitals/ Pain       Pain Assessment: No/denies pain  Home Living                                          Prior Functioning/Environment              Frequency  Min 3X/week        Progress Toward Goals  OT Goals(current goals can now be found in the care plan section)  Progress towards OT goals: Progressing toward goals  Acute Rehab OT Goals Patient Stated Goal: Wants to walk OT Goal Formulation: With patient Time For Goal Achievement: 02/20/16 Potential to Achieve Goals: Good ADL Goals Pt Will Perform Grooming: with set-up;sitting Pt Will Transfer to Toilet: with mod assist;bedside commode;stand pivot transfer Pt/caregiver will Perform Home Exercise Program: Increased ROM;Increased strength;Right Upper extremity;With minimal assist;With written HEP provided Additional ADL Goal #1: pt will complete bed mobility supervision level as precursor to adls.   Plan Discharge plan remains appropriate    Co-evaluation    PT/OT/SLP Co-Evaluation/Treatment: Yes Reason for Co-Treatment: Complexity of the patient's impairments (multi-system involvement);For patient/therapist safety PT goals addressed during session: Mobility/safety with mobility;Balance OT goals addressed during session: ADL's and self-care      End of Session Equipment Utilized During Treatment: Gait belt;Rolling walker   Activity Tolerance Patient tolerated treatment well   Patient Left with call bell/phone within reach;in bed;with bed alarm set   Nurse Communication Other (comment) (Pt ready to go to rehab)        Time: 1415-1444 OT Time Calculation (min): 29 min  Charges: OT General Charges $OT Visit: 1 Procedure OT Treatments $Self Care/Home Management : 8-22 mins  Doristine Section, OTR/L [phone] 02/11/2016, 5:20 PM

## 2016-02-11 NOTE — Discharge Summary (Addendum)
Physician Discharge Summary  [name] MRN: [id number] DOB/AGE: 55-Jul-1962 55 y.o.  PCP: [facility] date: 02/05/2016 Discharge date: 02/11/2016  Discharge Diagnoses:    Principal Problem:   Stroke (cerebrum) [facility]) Active Problems:   AKI (acute kidney injury) ([place])   Essential hypertension   Acute on chronic diastolic CHF (congestive heart failure) (HCC)   Anemia of chronic disease   Diabetes mellitus, type II, insulin dependent (HCC)   OSA ? compliacne   Hypokalemia   Hypertensive urgency   Acute CVA (cerebrovascular accident) ([place])   Cerebral infarction due to occlusion of left middle cerebral artery ([place])   Hypertension   Right sided weakness   Acute on chronic congestive heart failure ([place])    Follow-up recommendations Follow-up with PCP in 3-5 days , including all  additional recommended appointments as below Follow-up CBC, CMP in 3-5 days  Patient would need to follow-up with cardiology to determine need for cardiac catheterization in the future  Speech therapy Dysphagia 1 with thin liquids.    Medication Administration: Crushed with puree    Other  Recommendations Oral Care Recommendations: Oral care BID        Current Discharge Medication List    START taking these medications   Details  clopidogrel (PLAVIX) 75 MG tablet Take 1 tablet (75 mg total) by mouth daily. Qty: 30 tablet, Refills: 2    magnesium oxide (MAG-OX) 400 (241.3 Mg) MG tablet Take 1 tablet (400 mg total) by mouth 2 (two) times daily. Qty: 60 tablet, Refills: 0      CONTINUE these medications which have CHANGED   Details  doxazosin (CARDURA) 4 MG tablet Take 1 tablet (4 mg total) by mouth at bedtime. Qty: 30 tablet, Refills: 0    furosemide (LASIX) 40 MG tablet Take 2 tablets (80 mg total) by mouth   daily. Qty: 30 tablet    hydrALAZINE (APRESOLINE) 100 MG tablet Take 0.5 tablets (50 mg total) by mouth 3 (three) times daily. Qty: 90 tablet, Refills: 1     insulin glargine (LANTUS) 100 UNIT/ML injection Inject 0.05 mLs (5 Units total) into the skin at bedtime. Qty: 10 mL, Refills: 11    lisinopril (PRINIVIL,ZESTRIL) 20 MG tablet Take 1 tablet (20 mg total) by mouth daily. Qty: 30 tablet, Refills: 2    potassium chloride SA (K-DUR,KLOR-CON) 20 MEQ tablet Take 2 tablets (40 mEq total) by mouth daily. Qty: 60 tablet, Refills: 0    spironolactone (ALDACTONE) 25 MG tablet Take 1 tablet (25 mg total) by mouth daily. Qty: 30 tablet, Refills: 0      CONTINUE these medications which have NOT CHANGED   Details  acetaminophen (TYLENOL) 325 MG tablet Take 650 mg by mouth 3 (three) times daily as needed for mild pain, moderate pain or headache.     aspirin 325 MG tablet Take 325 mg by mouth daily.    atorvastatin (LIPITOR) 40 MG tablet Take 20 mg by mouth at bedtime.     Calcium Carbonate-Vitamin D (CALCIUM-VITAMIN D) 500-200 MG-UNIT tablet Take 1 tablet by mouth daily.     carvedilol (COREG) 25 MG tablet Take 50 mg by mouth 2 (two) times daily.     citalopram (CELEXA) 40 MG tablet Take 40 mg by mouth daily.    ferrous sulfate 325 (65 FE) MG tablet Take 325 mg by mouth every Monday, Wednesday, and Friday.     insulin aspart (NOVOLOG) 100 UNIT/ML injection Inject 3 Units into the skin 3 (three) times  daily before meals.     oxybutynin (DITROPAN) 5 MG tablet Take 5 mg by mouth 3 (three) times daily.    pantoprazole (PROTONIX) 40 MG tablet Take 40 mg by mouth daily. Take on an empty stomach 30 minutes before a meal    polyethylene glycol (MIRALAX / GLYCOLAX) packet Take 17 g by mouth daily as needed for mild constipation or moderate constipation.    sulfaSALAzine (AZULFIDINE) 500 MG tablet Take 2,000 mg by mouth 2 (two) times daily after a meal.     tamsulosin (FLOMAX) 0.4 MG CAPS capsule Take 0.4 mg by mouth at bedtime.     docusate sodium (COLACE) 100 MG capsule Take 100 mg by mouth daily as needed for mild constipation.    hydrocerin  (EUCERIN) CREA Apply 1 application topically daily.    ipratropium (ATROVENT) 0.02 % nebulizer solution Take 2.5 mLs (0.5 mg total) by nebulization every 2 (two) hours as needed for wheezing or shortness of breath. Qty: 75 mL, Refills: 0    isosorbide mononitrate (IMDUR) 30 MG 24 hr tablet Take 30 mg by mouth daily.    Multiple Vitamin (MULTIVITAMIN WITH MINERALS) TABS tablet Take 1 tablet by mouth daily.    ondansetron (ZOFRAN ODT) 4 MG disintegrating tablet Take 1 tablet (4 mg total) by mouth every 8 (eight) hours as needed for nausea or vomiting. Qty: 10 tablet, Refills: 0    silver sulfADIAZINE (SILVADENE) 1 % cream Apply 1 application topically daily as needed (skin care).       STOP taking these medications     amLODipine (NORVASC) 10 MG tablet      bumetanide (BUMEX) 2 MG tablet      ibuprofen (ADVIL,MOTRIN) 800 MG tablet      loperamide (IMODIUM) 2 MG capsule      metolazone (ZAROXOLYN) 5 MG tablet          Discharge Condition: Overall prognosis guarded   Discharge Instructions Get Medicines reviewed and adjusted: Please take all your medications with you for your next visit with your Primary MD  Please request your Primary MD to go over all hospital tests and procedure/radiological results at the follow up, please ask your Primary MD to get all Hospital records sent to his/her office.  If you experience worsening of your admission symptoms, develop shortness of breath, life threatening emergency, suicidal or homicidal thoughts you must seek medical attention immediately by calling 911 or calling your MD immediately if symptoms less severe.  You must read complete instructions/literature along with all the possible adverse reactions/side effects for all the Medicines you take and that have been prescribed to you. Take any new Medicines after you have completely understood and accpet all the possible adverse reactions/side effects.   Do not drive when taking Pain  medications.   Do not take more than prescribed Pain, Sleep and Anxiety Medications  Special Instructions: If you have smoked or chewed Tobacco in the last 2 yrs please stop smoking, stop any regular Alcohol and or any Recreational drug use.  Wear Seat belts while driving.  Please note  You were cared for by a hospitalist during your hospital stay. Once you are discharged, your primary care physician will handle any further medical issues. Please note that NO REFILLS for any discharge medications will be authorized once you are discharged, as it is imperative that you return to your primary care physician (or establish a relationship with a primary care physician if you do not have one) for your aftercare  needs so that they can reassess your need for medications and monitor your lab values.  Discharge Instructions    Ambulatory referral to Neurology    Complete by:  As directed    Pt will follow up with Dr. Erlinda Hong at [facility] in about 2 months. Thanks.   Diet - low sodium heart healthy    Complete by:  As directed    Increase activity slowly    Complete by:  As directed        Allergies  Allergen Reactions  . Chicken Allergy Other (See Comments)    Sneezing   . Sildenafil Other (See Comments)    Per the [place], this is possibly an allergy      Disposition: Inpatient rehabilitation   Consults:  Cardiology Neurology     Significant Diagnostic Studies:  Ct Angio Head W Or Wo Contrast  Result Date: 02/05/2016 CLINICAL DATA:  Initial evaluation for acute right-sided weakness, slurred speech, aphasia. EXAM: CT ANGIOGRAPHY HEAD AND NECK TECHNIQUE: Multidetector CT imaging of the head and neck was performed using the standard protocol during bolus administration of intravenous contrast. Multiplanar CT image reconstructions and MIPs were obtained to evaluate the vascular anatomy. Carotid stenosis measurements (when applicable) are obtained utilizing NASCET criteria, using the distal  internal carotid diameter as the denominator. CONTRAST:  90 cc of Isovue 370. COMPARISON:  Prior CT from earlier the same day. FINDINGS: CT HEAD FINDINGS Brain: Atrophy with chronic microvascular ischemic disease again noted. Remote lacunar infarcts within the bilateral basal ganglia. Now evident is subtle evolving hypodensity involving the left insular cortex extending towards the left frontal operculum (series 201, image 18). This is more evident as compared to previous examination performed earlier the same day. No significant mass effect. No acute intracranial hemorrhage. Hyperdensity at the level of the left sylvian fissure suspicious for thrombus (series 201, image 19). No mass lesion, midline shift, or mass effect. No hydrocephalus. No extra-axial fluid collection. Vascular: Vascular calcifications within the carotid siphons. Hyperdensity at the level the left sylvian fissure suspicious for thrombus (series 201, image 19). No other hyperdense vessel. Skull: Scalp soft tissues within normal limits.  Calvarium intact. Sinuses: Globes and orbital soft tissues within normal limits. Patient is status post scleral banding on the left. Mild opacity within the right ethmoidal air cells. Paranasal sinuses are otherwise clear. No mastoid effusion. CTA NECK FINDINGS Aortic arch: Visualized aortic arch of normal caliber with normal branch pattern. Scattered atheromatous plaque within the arch itself and about the origin of the great vessels without flow-limiting stenosis. Visualized subclavian arteries are widely patent. Right carotid system: Right common carotid artery patent from its origin to the bifurcation. Scattered calcified plaque about the right bifurcation without flow-limiting stenosis. Right ICA patent distally to the skullbase without stenosis, dissection, or occlusion. Left carotid system: Left common carotid artery patent from its origin to the bifurcation. Scattered calcified plaque about the left  bifurcation without flow-limiting stenosis. Left ICA patent distally to the skullbase without stenosis, dissection, or occlusion. Vertebral arteries: Both of the vertebral arteries arise from the subclavian arteries. The the vertebral arteries widely patent without stenosis, dissection, or occlusion. Focal plaque noted at the origin of the right vertebral artery without high-grade stenosis. Skeleton: No acute osseous abnormality. No worrisome lytic or blastic osseous lesions. Other neck: Visualized soft tissues of the neck demonstrate no acute abnormality. Upper chest: Visualized upper mediastinum within normal limits. Layering right pleural effusion. Probable smaller left effusion. Scattered interlobular septal thickening within the  partially visualized lungs, suggesting edema. Review of the MIP images confirms the above findings CTA HEAD FINDINGS Anterior circulation: Petrous segments patent bilaterally. Scattered calcified atheromatous plaque within the cavernous/ supraclinoid ICAs with mild to moderate multifocal narrowing. ICA termini patent. Left A1 segment dominant and widely patent. Right A1 segment hypoplastic but patent as well. Anterior communicating artery normal. Anterior cerebral arteries patent to their distal aspects. Right M1 segment patent without stenosis or occlusion. Right MCA bifurcation normal. Right MCA branches well opacified to their distal aspects. Left M1 segment patent without stenosis or occlusion. Left MCA bifurcation normal. No proximal M2 branch occlusion. There is abrupt occlusion of a left M3 branch, middle division (series 602, image 80). This corresponds with previously seen hyperdensity on noncontrast portion of this exam. No other arterial branch occlusion. Left MCA branches otherwise patent. Posterior circulation: Scattered plaque within the V4 segments bilaterally, right greater than left with mild multifocal narrowing. Posterior inferior cerebral arteries patent bilaterally.  Basilar artery widely patent. Superior cerebellar arteries patent bilaterally. Both of the posterior cerebral arteries supplied mainly via the basilar artery and are patent to their distal aspects. Venous sinuses: Patent. Anatomic variants: No significant anatomic variant. No aneurysm or vascular malformation. Delayed phase: Not performed. Review of the MIP images confirms the above findings IMPRESSION: 1. 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 2. Scattered atheromatous plaque throughout the carotid siphons with mild to moderate multifocal narrowing. 3. Atheromatous plaque about the carotid bifurcations bilaterally without flow-limiting stenosis. 4. Large layering right pleural effusion, with smaller left pleural effusion. Interlobular septal thickening suggestive of pulmonary edema. Critical Value/emergent results were called by telephone at the time of interpretation on 02/05/2016 at 6:19 pm to Dr. Roland Rack , who verbally acknowledged these results. Electronically Signed   By: Jeannine Boga M.D.   On: 02/05/2016 19:04   Ct Head Wo Contrast  Result Date: 02/05/2016 CLINICAL DATA:  Altered mental status, aphasia, right-sided weakness EXAM: CT HEAD WITHOUT CONTRAST TECHNIQUE: Contiguous axial images were obtained from the base of the skull through the vertex without intravenous contrast. COMPARISON:  MRI 05/19/2000 FINDINGS: Brain: Old lacunar infarct in the left basal ganglia. Old infarct and encephalomalacia in the right occipital lobe. No acute intracranial abnormality. Specifically, no hemorrhage, hydrocephalus, mass lesion, acute infarction, or significant intracranial injury. Vascular: No hyperdense vessel or unexpected calcification. Skull: No acute calvarial abnormality. Sinuses/Orbits: Visualized paranasal sinuses and mastoids clear. Orbital soft tissues unremarkable. Other: None IMPRESSION: Old left basal ganglia and  right occipital infarct. No acute intracranial abnormality. Electronically Signed   By: Rolm Baptise M.D.   On: 02/05/2016 15:25   Ct Angio Neck W Or Wo Contrast  Result Date: 02/05/2016 CLINICAL DATA:  Initial evaluation for acute right-sided weakness, slurred speech, aphasia. EXAM: CT ANGIOGRAPHY HEAD AND NECK TECHNIQUE: Multidetector CT imaging of the head and neck was performed using the standard protocol during bolus administration of intravenous contrast. Multiplanar CT image reconstructions and MIPs were obtained to evaluate the vascular anatomy. Carotid stenosis measurements (when applicable) are obtained utilizing NASCET criteria, using the distal internal carotid diameter as the denominator. CONTRAST:  90 cc of Isovue 370. COMPARISON:  Prior CT from earlier the same day. FINDINGS: CT HEAD FINDINGS Brain: Atrophy with chronic microvascular ischemic disease again noted. Remote lacunar infarcts within the bilateral basal ganglia. Now evident is subtle evolving hypodensity involving the left insular cortex extending towards the left frontal operculum (series 201,  image 18). This is more evident as compared to previous examination performed earlier the same day. No significant mass effect. No acute intracranial hemorrhage. Hyperdensity at the level of the left sylvian fissure suspicious for thrombus (series 201, image 19). No mass lesion, midline shift, or mass effect. No hydrocephalus. No extra-axial fluid collection. Vascular: Vascular calcifications within the carotid siphons. Hyperdensity at the level the left sylvian fissure suspicious for thrombus (series 201, image 19). No other hyperdense vessel. Skull: Scalp soft tissues within normal limits.  Calvarium intact. Sinuses: Globes and orbital soft tissues within normal limits. Patient is status post scleral banding on the left. Mild opacity within the right ethmoidal air cells. Paranasal sinuses are otherwise clear. No mastoid effusion. CTA NECK  FINDINGS Aortic arch: Visualized aortic arch of normal caliber with normal branch pattern. Scattered atheromatous plaque within the arch itself and about the origin of the great vessels without flow-limiting stenosis. Visualized subclavian arteries are widely patent. Right carotid system: Right common carotid artery patent from its origin to the bifurcation. Scattered calcified plaque about the right bifurcation without flow-limiting stenosis. Right ICA patent distally to the skullbase without stenosis, dissection, or occlusion. Left carotid system: Left common carotid artery patent from its origin to the bifurcation. Scattered calcified plaque about the left bifurcation without flow-limiting stenosis. Left ICA patent distally to the skullbase without stenosis, dissection, or occlusion. Vertebral arteries: Both of the vertebral arteries arise from the subclavian arteries. The the vertebral arteries widely patent without stenosis, dissection, or occlusion. Focal plaque noted at the origin of the right vertebral artery without high-grade stenosis. Skeleton: No acute osseous abnormality. No worrisome lytic or blastic osseous lesions. Other neck: Visualized soft tissues of the neck demonstrate no acute abnormality. Upper chest: Visualized upper mediastinum within normal limits. Layering right pleural effusion. Probable smaller left effusion. Scattered interlobular septal thickening within the partially visualized lungs, suggesting edema. Review of the MIP images confirms the above findings CTA HEAD FINDINGS Anterior circulation: Petrous segments patent bilaterally. Scattered calcified atheromatous plaque within the cavernous/ supraclinoid ICAs with mild to moderate multifocal narrowing. ICA termini patent. Left A1 segment dominant and widely patent. Right A1 segment hypoplastic but patent as well. Anterior communicating artery normal. Anterior cerebral arteries patent to their distal aspects. Right M1 segment patent  without stenosis or occlusion. Right MCA bifurcation normal. Right MCA branches well opacified to their distal aspects. Left M1 segment patent without stenosis or occlusion. Left MCA bifurcation normal. No proximal M2 branch occlusion. There is abrupt occlusion of a left M3 branch, middle division (series 602, image 80). This corresponds with previously seen hyperdensity on noncontrast portion of this exam. No other arterial branch occlusion. Left MCA branches otherwise patent. Posterior circulation: Scattered plaque within the V4 segments bilaterally, right greater than left with mild multifocal narrowing. Posterior inferior cerebral arteries patent bilaterally. Basilar artery widely patent. Superior cerebellar arteries patent bilaterally. Both of the posterior cerebral arteries supplied mainly via the basilar artery and are patent to their distal aspects. Venous sinuses: Patent. Anatomic variants: No significant anatomic variant. No aneurysm or vascular malformation. Delayed phase: Not performed. Review of the MIP images confirms the above findings IMPRESSION: 1. 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 2. Scattered atheromatous plaque throughout the carotid siphons with mild to moderate multifocal narrowing. 3. Atheromatous plaque about the carotid bifurcations bilaterally without flow-limiting stenosis. 4. Large layering right pleural effusion, with smaller left pleural effusion. Interlobular septal thickening  suggestive of pulmonary edema. Critical Value/emergent results were called by telephone at the time of interpretation on 02/05/2016 at 6:19 pm to Dr. Roland Rack , who verbally acknowledged these results. Electronically Signed   By: Jeannine Boga M.D.   On: 02/05/2016 19:04   Mr Brain Wo Contrast  Result Date: 02/06/2016 CLINICAL DATA:  Acute on chronic RIGHT-sided weakness, aphasia. Follow-up M3 occlusion. Assess stroke  versus PRES. History of hypertension and diabetes. EXAM: MRI HEAD WITHOUT CONTRAST TECHNIQUE: Multiplanar, multiecho pulse sequences of the brain and surrounding structures were obtained without intravenous contrast. COMPARISON:  CT HEAD February 05, 2016 and MRI of the head May 20, 2015 FINDINGS: BRAIN: Confluent reduced diffusion measuring up to 5.8 cm in LEFT frontal lobe, involving the insula and operculum with low ADC values. Non contiguous patchy reduced diffusion LEFT anterior frontal lobe. No susceptibility artifact to suggest acute hemorrhage. The ventricles and sulci are normal for patient's age. Small area RIGHT occipital lobe encephalomalacia. Old LEFT pontomedullary infarcts. Old bilateral basal ganglia lacunar infarcts. Symmetric basal ganglia susceptibility artifact most consistent with old hemorrhage. Patchy supratentorial and pontine white matter FLAIR T2 hyperintensities. Mild ventriculomegaly on the basis of global parenchymal brain volume loss. No intraparenchymal mass effect, masses. No abnormal extra-axial fluid collections. VASCULAR: Normal major intracranial vascular flow voids present at skull base. SKULL AND UPPER CERVICAL SPINE: Empty sella. No suspicious calvarial bone marrow signal. Craniocervical junction maintained. SINUSES/ORBITS: Mild paranasal sinus mucosal thickening. The included ocular globes and orbital contents are non-suspicious. Status post LEFT scleral banding and ocular lens implant. OTHER: None. IMPRESSION: Acute small to moderate LEFT frontal lobe/ MCA territory infarct corresponding to perfusion abnormality. 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 Old basal ganglia and pontomedullary lacunar infarcts. Old RIGHT occipital lobe/ PCA territory infarct. Mild to moderate chronic small vessel ischemic disease. Mild atrophy. Electronically Signed   By: Elon Alas M.D.   On: 02/06/2016 06:01   Ct Cerebral Perfusion W Contrast  Result Date: 02/05/2016 CLINICAL  DATA:  Initial evaluation for acute right-sided weakness. EXAM: CT PERFUSION BRAIN TECHNIQUE: Multiphase CT imaging of the brain was performed following IV bolus contrast injection. Subsequent parametric perfusion maps were calculated using RAPID software. CONTRAST:  90 cc of Isovue 370. FINDINGS: CT Brain Perfusion Findings: CBF (<30%) Volume: 21 ccmL Perfusion (Tmax>6.0s) volume: 92 ccmL Mismatch Volume: 71 ccmL Infarction Location:Core infarct primarily involves the left frontal operculum/anterior left frontal lobe, with some extension towards the left insula and left temporal lobe. Please note that while the mismatch volume is measured as 71 cc of this exam, there is felt to be some artifact on the T max perfusion images, with elevated T-max seen not only around the core infarct, but also within the right cerebral hemisphere as well as the right cerebellar hemisphere. Overall, while there is likely some penumbra about the area of core infarction, the recorded 71 cc is likely not accurate as to the volume of actual mismatch (which is likely significantly less, although difficult to accurately quantify on this exam). IMPRESSION: Acute small to moderate-sized left MCA territory infarct as above. Critical Value/emergent results were called by telephone at the time of interpretation on 02/05/2016 at 6:19 pm to Dr. Roland Rack , who verbally acknowledged these results. Electronically Signed   By: Jeannine Boga M.D.   On: 02/05/2016 18:27   Dg Chest Port 1 View  Result Date: 02/05/2016 CLINICAL DATA:  Acute altered mental status. EXAM: PORTABLE CHEST 1 VIEW COMPARISON:  07/26/2015 FINDINGS: The patient  has developed cardiomegaly with pulmonary vascular congestion and slight bilateral interstitial pulmonary edema. No acute bone abnormality. IMPRESSION: New cardiomegaly with pulmonary vascular congestion and slight bilateral interstitial pulmonary edema. Electronically Signed   By: Lorriane Shire M.D.    On: 02/05/2016 16:01   Dg Swallowing Func-speech Pathology  Result Date: 02/06/2016 Objective Swallowing Evaluation: Type of Study: MBS-Modified Barium Swallow Study Patient Details Name: Eliezer Khawaja MRN: [id number] Date of Birth: 1960-05-14 Today's Date: 02/06/2016 Time: SLP Start Time (ACUTE ONLY): 1017-SLP Stop Time (ACUTE ONLY): 1033 SLP Time Calculation (min) (ACUTE ONLY): 16 min Past Medical History: Past Medical History: Diagnosis Date . BPH (benign prostatic hyperplasia)  . CHF (congestive heart failure) ([place])  . Diabetes mellitus without complication ([place])  . Diabetic retinopathy associated with type 2 diabetes mellitus ([place])  . Hypertension  . Lymphedema  . OSA (obstructive sleep apnea)  . TIA (transient ischemic attack)  Past Surgical History: Past Surgical History: Procedure Laterality Date . BLADDER SURGERY   . CARDIAC CATHETERIZATION   HPI: Karee Christopherson a 56 y.o.malewith medical history significant for chronic CHF, insulin-dependent diabetes mellitus, hypertension, depression, OSA, ulcerative colitis, prior CVA with minimal residual right-sided weakness who presents to the emergency department with dysarthria, confusion, lethargy, and right facial droop. Patient lives alone and family resides in [facility], [place] and [place]. Chest x-ray is notable for new cardiomegaly with pulmonary vascular congestion and slight bilateral interstitial edema. MRI acute small to moderate LEFT frontal lobe/ MCA territory infarct corresponding to perfusion abnormality. 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 Old basal ganglia. MBS recommended following BSE. No Data Recorded Assessment / Plan / Recommendation CHL IP CLINICAL IMPRESSIONS 02/06/2016 Therapy Diagnosis Moderate oral phase dysphagia;Mild pharyngeal phase dysphagia Clinical Impression Labial spillage, delayed transit and intermittent piecemeal pattern describe pt's oral function. Swallow initiation delayed to pyriform sinuses due to decreased  sensation without penetration or aspiration with consecutive cup and straw sips and minimal vallecular residue. Upon clinical observation during bedside swallow and following MBS, pt has multiple swallows and intermittent mild throat clears with the appearance of difficulty however pt safe with consecutive larger sips. Risk for pocketing present therefore recommend Dys 2 texture and thin liquids, crush meds, straws allowed, sit upright and check right side oral cavity for pocketing. ST will continue to follow.     Impact on safety and function Mild aspiration risk   CHL IP TREATMENT RECOMMENDATION 02/06/2016 Treatment Recommendations Therapy as outlined in treatment plan below   Prognosis 02/06/2016 Prognosis for Safe Diet Advancement Good Barriers to Reach Goals -- Barriers/Prognosis Comment -- CHL IP DIET RECOMMENDATION 02/06/2016 SLP Diet Recommendations Dysphagia 2 (Fine chop) solids;Thin liquid Liquid Administration via Straw;Cup Medication Administration Crushed with puree Compensations Slow rate;Small sips/bites;Minimize environmental distractions;Lingual sweep for clearance of pocketing Postural Changes Seated upright at 90 degrees   CHL IP OTHER RECOMMENDATIONS 02/06/2016 Recommended Consults -- Oral Care Recommendations Oral care BID Other Recommendations --   CHL IP FOLLOW UP RECOMMENDATIONS 02/06/2016 Follow up Recommendations Inpatient Rehab   CHL IP FREQUENCY AND DURATION 02/06/2016 Speech Therapy Frequency (ACUTE ONLY) min 2x/week Treatment Duration 2 weeks      CHL IP ORAL PHASE 02/06/2016 Oral Phase Impaired Oral - Pudding Teaspoon -- Oral - Pudding Cup -- Oral - Honey Teaspoon -- Oral - Honey Cup -- Oral - Nectar Teaspoon -- Oral - Nectar Cup -- Oral - Nectar Straw -- Oral - Thin Teaspoon -- Oral - Thin Cup Delayed oral transit;Piecemeal swallowing Oral - Thin Straw Delayed oral transit;Piecemeal  swallowing Oral - Puree -- Oral - Mech Soft -- Oral - Regular Delayed oral transit;Weak lingual manipulation Oral -  Multi-Consistency -- Oral - Pill -- Oral Phase - Comment --  CHL IP PHARYNGEAL PHASE 02/06/2016 Pharyngeal Phase Impaired Pharyngeal- Pudding Teaspoon -- Pharyngeal -- Pharyngeal- Pudding Cup -- Pharyngeal -- Pharyngeal- Honey Teaspoon -- Pharyngeal -- Pharyngeal- Honey Cup -- Pharyngeal -- Pharyngeal- Nectar Teaspoon -- Pharyngeal -- Pharyngeal- Nectar Cup Delayed swallow initiation-pyriform sinuses Pharyngeal -- Pharyngeal- Nectar Straw -- Pharyngeal -- Pharyngeal- Thin Teaspoon -- Pharyngeal -- Pharyngeal- Thin Cup Delayed swallow initiation-pyriform sinuses;Pharyngeal residue - valleculae Pharyngeal -- Pharyngeal- Thin Straw Delayed swallow initiation-pyriform sinuses;Pharyngeal residue - valleculae Pharyngeal -- Pharyngeal- Puree -- Pharyngeal -- Pharyngeal- Mechanical Soft -- Pharyngeal -- Pharyngeal- Regular WFL Pharyngeal -- Pharyngeal- Multi-consistency -- Pharyngeal -- Pharyngeal- Pill -- Pharyngeal -- Pharyngeal Comment --  CHL IP CERVICAL ESOPHAGEAL PHASE 02/06/2016 Cervical Esophageal Phase WFL Pudding Teaspoon -- Pudding Cup -- Honey Teaspoon -- Honey Cup -- Nectar Teaspoon -- Nectar Cup -- Nectar Straw -- Thin Teaspoon -- Thin Cup -- Thin Straw -- Puree -- Mechanical Soft -- Regular -- Multi-consistency -- Pill -- Cervical Esophageal Comment -- No flowsheet data found. Houston Siren 02/06/2016, 11:14 AM Orbie Pyo Colvin Caroli.Ed CCC-SLP Pager [phone]               TEE LV EF: 30% -   35%  ------------------------------------------------------------------- Indications:      Bacteremia 790.7.  ------------------------------------------------------------------- History:   PMH:   Congestive heart failure.  Transient ischemic attack.  Risk factors:  Hypertension. Diabetes mellitus.  ------------------------------------------------------------------- Study Conclusions  - Left ventricle: The cavity size was normal. Wall thickness was   increased in a pattern of moderate LVH. Systolic  function was   moderately to severely reduced. The estimated ejection fraction   was in the range of 30% to 35%. - Aortic valve: There was trivial regurgitation. - Mitral valve: No evidence of vegetation. - Left atrium: No evidence of thrombus in the atrial cavity or   appendage. - Right ventricle: Systolic function was moderately reduced. - Atrial septum: No defect or patent foramen ovale was identified. - Tricuspid valve: No evidence of vegetation.   Filed Weights   02/09/16 0444 02/10/16 0500 02/11/16 0408  Weight: 100.9 kg (222 lb 8 oz) 97.1 kg (214 lb) 96.4 kg (212 lb 8 oz)     Microbiology: Recent Results (from the past 240 hour(s))  Blood culture (routine x 2)     Status: None   Collection Time: 02/05/16  3:32 PM  Result Value Ref Range Status   Specimen Description BLOOD LEFT HAND AEROBIC BOTTLE ONLY  Final   Special Requests 5CC  Final   Culture NO GROWTH 5 DAYS  Final   Report Status 02/10/2016 FINAL  Final  Blood culture (routine x 2)     Status: None   Collection Time: 02/05/16  3:37 PM  Result Value Ref Range Status   Specimen Description RIGHT ANTECUBITAL AEROBIC BOTTLE ONLY  Final   Special Requests 5CC  Final   Culture NO GROWTH 5 DAYS  Final   Report Status 02/10/2016 FINAL  Final  Urine culture     Status: None   Collection Time: 02/05/16  4:24 PM  Result Value Ref Range Status   Specimen Description URINE, CATHETERIZED  Final   Special Requests NONE  Final   Culture NO GROWTH  Final   Report Status 02/06/2016 FINAL  Final  Blood Culture    Component Value Date/Time   SDES URINE, CATHETERIZED 02/05/2016 1624   SPECREQUEST NONE 02/05/2016 1624   CULT NO GROWTH 02/05/2016 1624   REPTSTATUS 02/06/2016 FINAL 02/05/2016 1624      Labs: Results for orders placed or performed during the hospital encounter of 02/05/16 (from the past 48 hour(s))  Glucose, capillary     Status: None   Collection Time: 02/09/16 11:02 AM  Result Value Ref Range    Glucose-Capillary 75 65 - 99 mg/dL  Glucose, capillary     Status: None   Collection Time: 02/09/16  3:59 PM  Result Value Ref Range   Glucose-Capillary 80 65 - 99 mg/dL  Glucose, capillary     Status: None   Collection Time: 02/09/16  8:37 PM  Result Value Ref Range   Glucose-Capillary 97 65 - 99 mg/dL  Glucose, capillary     Status: None   Collection Time: 02/10/16 12:14 AM  Result Value Ref Range   Glucose-Capillary 82 65 - 99 mg/dL  Glucose, capillary     Status: Abnormal   Collection Time: 02/10/16  4:26 AM  Result Value Ref Range   Glucose-Capillary 63 (L) 65 - 99 mg/dL   Comment 1 Notify RN   Glucose, capillary     Status: Abnormal   Collection Time: 02/10/16  5:04 AM  Result Value Ref Range   Glucose-Capillary 107 (H) 65 - 99 mg/dL  Basic metabolic panel     Status: Abnormal   Collection Time: 02/10/16  5:34 AM  Result Value Ref Range   Sodium 143 135 - 145 mmol/L   Potassium 3.2 (L) 3.5 - 5.1 mmol/L   Chloride 104 101 - 111 mmol/L   CO2 32 22 - 32 mmol/L   Glucose, Bld 107 (H) 65 - 99 mg/dL   BUN 17 6 - 20 mg/dL   Creatinine, Ser 1.39 (H) 0.61 - 1.24 mg/dL   Calcium 8.8 (L) 8.9 - 10.3 mg/dL   GFR calc non Af Amer 56 (L) >60 mL/min   GFR calc Af Amer >60 >60 mL/min    Comment: (NOTE) The eGFR has been calculated using the CKD EPI equation. This calculation has not been validated in all clinical situations. eGFR's persistently <60 mL/min signify possible Chronic Kidney Disease.    Anion gap 7 5 - 15  CBC     Status: Abnormal   Collection Time: 02/10/16  5:34 AM  Result Value Ref Range   WBC 6.6 4.0 - 10.5 K/uL   RBC 4.40 4.22 - 5.81 MIL/uL   Hemoglobin 12.5 (L) 13.0 - 17.0 g/dL   HCT 38.9 (L) 39.0 - 52.0 %   MCV 88.4 78.0 - 100.0 fL   MCH 28.4 26.0 - 34.0 pg   MCHC 32.1 30.0 - 36.0 g/dL   RDW 16.1 (H) 11.5 - 15.5 %   Platelets 212 150 - 400 K/uL  Glucose, capillary     Status: None   Collection Time: 02/10/16  6:45 AM  Result Value Ref Range    Glucose-Capillary 99 65 - 99 mg/dL  Glucose, capillary     Status: Abnormal   Collection Time: 02/10/16 10:59 AM  Result Value Ref Range   Glucose-Capillary 106 (H) 65 - 99 mg/dL   Comment 1 Notify RN    Comment 2 Document in Chart   Glucose, capillary     Status: Abnormal   Collection Time: 02/10/16  4:52 PM  Result Value Ref Range   Glucose-Capillary 100 (  H) 65 - 99 mg/dL  Glucose, capillary     Status: Abnormal   Collection Time: 02/10/16  9:55 PM  Result Value Ref Range   Glucose-Capillary 130 (H) 65 - 99 mg/dL   Comment 1 Notify RN    Comment 2 Document in Chart   Glucose, capillary     Status: None   Collection Time: 02/11/16  6:13 AM  Result Value Ref Range   Glucose-Capillary 72 65 - 99 mg/dL   Comment 1 Notify RN    Comment 2 Document in Chart   Comprehensive metabolic panel     Status: Abnormal   Collection Time: 02/11/16  8:39 AM  Result Value Ref Range   Sodium 145 135 - 145 mmol/L   Potassium 3.6 3.5 - 5.1 mmol/L   Chloride 106 101 - 111 mmol/L   CO2 32 22 - 32 mmol/L   Glucose, Bld 67 65 - 99 mg/dL   BUN 19 6 - 20 mg/dL   Creatinine, Ser 1.57 (H) 0.61 - 1.24 mg/dL   Calcium 8.4 (L) 8.9 - 10.3 mg/dL   Total Protein 5.0 (L) 6.5 - 8.1 g/dL   Albumin 2.1 (L) 3.5 - 5.0 g/dL   AST 24 15 - 41 U/L   ALT 13 (L) 17 - 63 U/L   Alkaline Phosphatase 148 (H) 38 - 126 U/L   Total Bilirubin 0.5 0.3 - 1.2 mg/dL   GFR calc non Af Amer 48 (L) >60 mL/min   GFR calc Af Amer 56 (L) >60 mL/min    Comment: (NOTE) The eGFR has been calculated using the CKD EPI equation. This calculation has not been validated in all clinical situations. eGFR's persistently <60 mL/min signify possible Chronic Kidney Disease.    Anion gap 7 5 - 15     Lipid Panel     Component Value Date/Time   CHOL 157 02/06/2016 0429   TRIG 80 02/06/2016 0429   HDL 47 02/06/2016 0429   CHOLHDL 3.3 02/06/2016 0429   VLDL 16 02/06/2016 0429   LDLCALC 94 02/06/2016 0429     Lab Results  Component  Value Date   HGBA1C 6.4 (H) 02/05/2016     Lab Results  Component Value Date   LDLCALC 94 02/06/2016   CREATININE 1.57 (H) 02/11/2016     HPI :*   Demarquez Ciolek was admitted on 02/05/2016 with CVA.  PMHx of chronic diastolic CHF (EF 13-24% in 04/2015), DMT2, HTN, ulcerative colitis, OSA on CPAP, prior CVA and depression.  They first developed symptoms while at home on the phone of slurred speech and dropped the phone, and facial droop. Patient  admitted to  telemetry unit for ongoing evaluation and management of acute CVA.Hedidnot receive IV t-PA due to being outside the window  MRI brain shows Acute small to moderate LEFT frontal lobe/ MCA territory infarct corresponding to perfusion abnormality.  He was also noted to have a clear decrease in his EF, markedly edematous with CHF and cardiology was called in and following, no ischemic w/u is planned at least at this time.   He remains with R sided deficit and expressive aphasia.  Imaging demonstrated Dominant left frontal/MCA territory infarct embolic pattern secondary to unknown source.  he has undergone workup for stroke including echocardiogram and carotid angio, LE dopplers are negative for DVT b/l.  The patient has been monitored on telemetry which has demonstrated sinus rhythm with no arrhythmias.  Inpatient stroke work-up is to be completed with a TEE.  HOSPITAL COURSE: *  1. Stroke: Dominant left frontal/MCA territoryinfarct embolic pattern secondary to unknownsource. Patient does have multiple stroke risk factors, however, cardiac embolic infarct cannot be ruled out.  Resultant expressive aphasia, right hemiparesis  CTA head and neck L M3 occlusion, R pleural effusion, pulmonary edema.  CTP small to moderate size L MCA territory infarct  MRISmall to moderate L frontal/MCA territory infarct. Old B BG, pontomedullary, R occipital/PCA infarcts.  2D Echo LV EF: 35%.Diffuse hypokinesis-cardiology consulted for further  recommendations for CHF, TEE negative for embolic source.   loop recorder placed 02/08/2016  LE doppler no DVT  LDL94  HgbA1c 6.4    Lovenox 40 mg sq daily for VTE prophylaxis  Diet Diet recommendations: Thin liquid;Dysphagia 1 (puree  No antithromboticprior to admission, now on aspirin 300 mg suppository daily. Due to intracranial stenosis, neurology recommends DAPT with aspirin and Plavix for 3 months and then Plavix alone.  Therapy recommendations:  CIR  Disposition:  CIR  1/8   2. Acute on chronic systolic/diastolicCHF  - Initially found to have BLE edema, JVD, crackles, BNP 3370, CXR with interstitial edema  - TTE (04/13/15) with EF 50-55%,  - He had Lasix, Bumex, and Zaroxolyn, Aldactone, Coreg, and lisinopril on home med list EF down to 35%  , . Body weight down from 240>212 lbs  Patient treated with IV diuresis given elevated filling pressures on ECHO and marked LE edema.  Now switched to PO Lasix, creatinine is stable, Continue lisinopril, Coreg, Imdur, low-dose hydralazine, added Aldactone Continue above meds as long as blood pressure is stable. Contact cardiology if needed Repeat echocardiogram 3 months. If LV function remains decreased would need ischemia evaluation likely catheterization  3. Hypertension with hypertensive urgency - BP 200/140 in ED, likely exacerbated by acute CHF and acute cerebral artery occlusion  - Given the acute ischemic CVA, will treat only for SBP >220 or DBP >110 for now in acute phase    4. Insulin-dependent DM  - A1c was 5.9% in 2016. Accu-Chek stable - Reduced dose of Lantus        5. Hypokalemia Repleted - Monitor on telemetry  Follow electrolytes and renal function closely  6. Normocytic anemia  - Hgb 12.1 on admission  - Stable relative to priors with no sign of bleeding  7. OSA - CPAP qHS      Discharge Exam:   Blood pressure (!) 161/76, pulse 60, temperature 98.8 F (37.1 C), temperature source  Oral, resp. rate 20, height _0  (1.803 m), weight 96.4 kg (212 lb 8 oz), SpO2 99 %.  General exam: Appears calm and comfortable  Respiratory system: Clear to auscultation. Respiratory effort normal. Cardiovascular system: S1 & S2 heard, RRR. No JVD, murmurs, rubs, gallops or clicks. No pedal edema. Gastrointestinal system: Abdomen is nondistended, soft and nontender. No organomegaly or masses felt. Normal bowel sounds heard. Central nervous system: sleepy drowsy, but arousable and following some commands. Still aphasic with right-sided weakness       Follow-up Information    Xu,Jindong, MD. Schedule an appointment as soon as possible for a visit in 6 week(s).   Specialty:  Neurology Contact information: [address] [place] [address] [phone]        [facility]. Call.   Why:  Hospital follow-up Contact information: [place] [address] [phone]        Candee Furbish, MD. Call.   Specialty:  Cardiology Why:  Hospital follow-up, possible cardiac cath  needed Contact information: [address] [place] [address] [phone]           Signed: Reyne Dumas 02/11/2016, 10:36 AM        Time spent >45 mins

## 2016-02-11 NOTE — Care Management Note (Signed)
Case Management Note  Patient Details  Name: [name] MRN: [id number] Date of Birth: 1960/07/10  Subjective/Objective:                    Action/Plan: Pt discharging to CIR today. No further needs per CM.   Expected Discharge Date:                  Expected Discharge Plan:  IP Rehab Facility  In-House Referral:     Discharge planning Services     Post Acute Care Choice:    Choice offered to:     DME Arranged:    DME Agency:     HH Arranged:    HH Agency:     Status of Service:  Completed, signed off  If discussed at [employer] of Stay Meetings, dates discussed:    Additional Comments:  Kermit Balo, RN 02/11/2016, 11:30 AM

## 2016-02-11 NOTE — Progress Notes (Signed)
Patient Name: [name] Date of Encounter: 02/11/2016  Primary Cardiologist:   Dr. Dewayne Hatch Problem List     Principal Problem:   Stroke (cerebrum) [facility]) Active Problems:   AKI (acute kidney injury) (HCC)   Essential hypertension   Acute on chronic diastolic CHF (congestive heart failure) (HCC)   Anemia of chronic disease   Diabetes mellitus, type II, insulin dependent (HCC)   OSA ? compliacne   Hypokalemia   Hypertensive urgency   Acute CVA (cerebrovascular accident) (HCC)   Cerebral infarction due to occlusion of left middle cerebral artery (HCC)   Hypertension   Right sided weakness   Acute on chronic congestive heart failure (HCC)   Expressive aphasia   Left hemiparesis (HCC)   Depression   Dysphagia, post-stroke   Benign essential HTN   Type 2 diabetes mellitus with peripheral neuropathy (HCC)   Ulcerative colitis with complication (HCC)   Benign prostatic hyperplasia   Hyperlipidemia   Stage 3 chronic kidney disease   Peripheral edema     Subjective   Denies chest pain or SOB.    Inpatient Medications    Scheduled Meds: . aspirin EC  325 mg Oral Daily   Or  . aspirin  300 mg Rectal Daily  . atorvastatin  20 mg Oral QHS  . calcium-vitamin D  1 tablet Oral Daily  . carvedilol  50 mg Oral BID  . citalopram  40 mg Oral Daily  . clopidogrel  75 mg Oral Daily  . doxazosin  4 mg Oral QHS  . enoxaparin (LOVENOX) injection  40 mg Subcutaneous Q24H  . furosemide  80 mg Intravenous BID  . insulin aspart  0-9 Units Subcutaneous TID AC & HS  . insulin glargine  5 Units Subcutaneous QHS  . isosorbide mononitrate  30 mg Oral Daily  . lisinopril  20 mg Oral Daily  . magnesium oxide  400 mg Oral BID  . multivitamin with minerals  1 tablet Oral Daily  . pantoprazole  40 mg Oral Daily  . sodium chloride flush  3 mL Intravenous Q12H  . sulfaSALAzine  2,000 mg Oral BID  . tamsulosin  0.4 mg Oral QHS   Continuous Infusions:  PRN Meds: sodium  chloride, acetaminophen, docusate sodium, labetalol, ondansetron (ZOFRAN) IV, polyethylene glycol, sodium chloride flush   Vital Signs    Vitals:   02/11/16 0100 02/11/16 0408 02/11/16 0530 02/11/16 1011  BP: 140/78  136/84 (!) 161/76  Pulse: 60  81 60  Resp: 18  18 20   Temp: 97.8 F (36.6 C)  98.6 F (37 C) 98.8 F (37.1 C)  TempSrc: Oral  Oral Oral  SpO2: 99%  99% 99%  Weight:  212 lb 8 oz (96.4 kg)    Height:        Intake/Output Summary (Last 24 hours) at 02/11/16 1249 Last data filed at 02/10/16 2200  Gross per 24 hour  Intake                0 ml  Output              900 ml  Net             -900 ml   Filed Weights   02/09/16 0444 02/10/16 0500 02/11/16 0408  Weight: 222 lb 8 oz (100.9 kg) 214 lb (97.1 kg) 212 lb 8 oz (96.4 kg)    Physical Exam    GEN: NAD.  Neck:  No JVD Cardiac: Regular Rate  and Rhythm, no murmurs, rubs, or gallops.  No edema.  Radials/DP/PT 2+  and equal bilaterally.  Respiratory:  Respirations  regular and unlabored, clear to auscultation bilaterally. GI: Soft, nontender, nondistended, BS + x 4. Skin: warm and dry, no rash. Neuro:  Diffuse weakness.   Psych:  AAOx3.  Normal affect.  Labs    CBC  Recent Labs  02/10/16 0534  WBC 6.6  HGB 12.5*  HCT 38.9*  MCV 88.4  PLT 212   Basic Metabolic Panel  Recent Labs  02/10/16 0534 02/11/16 0839  NA 143 145  K 3.2* 3.6  CL 104 106  CO2 32 32  GLUCOSE 107* 67  BUN 17 19  CREATININE 1.39* 1.57*  CALCIUM 8.8* 8.4*   Liver Function Tests  Recent Labs  02/11/16 0839  AST 24  ALT 13*  ALKPHOS 148*  BILITOT 0.5  PROT 5.0*  ALBUMIN 2.1*   No results for input(s): LIPASE, AMYLASE in the last 72 hours. Cardiac Enzymes No results for input(s): CKTOTAL, CKMB, CKMBINDEX, TROPONINI in the last 72 hours. BNP Invalid input(s): POCBNP D-Dimer No results for input(s): DDIMER in the last 72 hours. Hemoglobin A1C No results for input(s): HGBA1C in the last 72 hours. Fasting Lipid  Panel No results for input(s): CHOL, HDL, LDLCALC, TRIG, CHOLHDL, LDLDIRECT in the last 72 hours. Thyroid Function Tests No results for input(s): TSH, T4TOTAL, T3FREE, THYROIDAB in the last 72 hours.  Invalid input(s): FREET3  Telemetry    Rare ventricular and atrial ectopy.  Otherwise NSR.  - Personally Reviewed  ECG    NA - Personally Reviewed  Radiology    No results found.  Cardiac Studies   TEE - Left ventricle: The cavity size was normal. Wall thickness was   increased in a pattern of moderate LVH. Systolic function was   moderately to severely reduced. The estimated ejection fraction   was in the range of 30% to 35%. - Aortic valve: There was trivial regurgitation. - Mitral valve: No evidence of vegetation. - Left atrium: No evidence of thrombus in the atrial cavity or   appendage. - Right ventricle: Systolic function was moderately reduced. - Atrial septum: No defect or patent foramen ovale was identified. - Tricuspid valve: No evidence of vegetation.  Patient Profile     56 yo male admitted with CVA; Echo showed newly reduced LV systolic function (EF 35); TEE showed no SOE; ILR placed. Also being treated for CHF and severe HTN.  Assessment & Plan    ACUTE SYSTOLIC HF:  Minus 7500 cc since admit.  Weight is down 28 lbs.  Creat is up slightly.  I will change to PO Lasix  HTN:  BP is slightly labile but for the most part well controlled.  Continue current therapy.   CKD STAGE III:  Creat is up slightly.  As above.   CVA:  Status post implanted loop.    HYPOKALEMIA:    Supplemented and OK today.   Signed, Rollene Rotunda, MD  02/11/2016, 12:49 PM

## 2016-02-11 NOTE — H&P (Signed)
Physical Medicine and Rehabilitation Admission H&P    Chief Complaint  Patient presents with  . Stroke Symptoms  : HPI:  [name] is a 56 y.o. right handed male with history of diastolic congestive heart failure,OSA, diabetes mellitus and peripheral neuropathy, hypertension, TIA maintained on aspirin, chronic renal insufficiency creatinine baseline 1.50-1.58. Per chart review patient lives alone. Reported to be independent with assistive device prior to admission. One level apartment. Presented 02/05/2016 with slurred speech, altered mental status and right-sided weakness. He was hypertensive 200/140. EKG showed sinus rhythm with LAFB and LVH. Chest x-ray new cardiomegaly with pulmonary vascular congestion and slight bilateral interstitial edema. Cranial CT scan negative for acute changes. 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 CT cerebral perfusion showed acute small to moderate-sized left MCA territory infarct. Patient did not receive TPA. CT angiogram head and neck showed acute left M3 occlusion as well as scattered atheromatous plaque. MRI completed 02/07/2016 showed moderate left frontal lobe MCA territory infarct as well as old bilateral basal ganglia infarcts and right occipital lobe. Echocardiogram with ejection fraction of 35% diffuse hypokinesis with grade 2 diastolic dysfunction. Venous Doppler studies negative for DVT. Maintained on aspirin for Plavix/ CVA prophylaxis 3 months then Plavix alone. Subcutaneous Lovenox for DVT prophylaxis. Dysphagia# 2 thin liquid diets.TEE completed 02/08/2016 showing severe LV dysfunction with EF of 30% no thrombi and loop recorder was placed. Physical and occupational therapy evaluations completed with recommendations of physical medicine rehabilitation consult.Patient was admitted for comprehensive rehabilitation program  Review of Systems  Constitutional: Negative for chills and fever.  HENT: Negative for hearing loss and tinnitus.    Eyes: Negative for blurred vision and double vision.  Respiratory: Negative for cough and shortness of breath.   Cardiovascular: Negative for chest pain, palpitations and leg swelling.  Gastrointestinal: Positive for constipation. Negative for nausea and vomiting.  Genitourinary: Positive for urgency.  Musculoskeletal: Positive for myalgias.  Skin: Negative for rash.  Neurological: Positive for speech change and weakness. Negative for seizures and loss of consciousness.  All other systems reviewed and are negative.  Past Medical History:  Diagnosis Date  . BPH (benign prostatic hyperplasia)   . CHF (congestive heart failure) ([facility])   . Diabetes mellitus without complication ([facility])   . Diabetic retinopathy associated with type 2 diabetes mellitus ([facility])   . Hypertension   . Lymphedema   . OSA (obstructive sleep apnea)   . TIA (transient ischemic attack)    Past Surgical History:  Procedure Laterality Date  . BLADDER SURGERY    . CARDIAC CATHETERIZATION     Family History  Problem Relation Age of Onset  . Cancer Mother   . Cancer Father    Social History:  reports that he has never smoked. He has never used smokeless tobacco. He reports that he does not drink alcohol or use drugs. Allergies:  Allergies  Allergen Reactions  . Chicken Allergy Other (See Comments)    Sneezing   . Sildenafil Other (See Comments)    Per the Portal, this is possibly an allergy   Medications Prior to Admission  Medication Sig Dispense Refill  . acetaminophen (TYLENOL) 325 MG tablet Take 650 mg by mouth 3 (three) times daily as needed for mild pain, moderate pain or headache.     Marland Kitchen amLODipine (NORVASC) 10 MG tablet Take 10 mg by mouth daily.    Marland Kitchen aspirin 325 MG tablet Take 325 mg by mouth daily.    Marland Kitchen atorvastatin (LIPITOR) 40 MG  tablet Take 20 mg by mouth at bedtime.     . bumetanide (BUMEX) 2 MG tablet Take 2 mg by mouth 2 (two) times daily.    . Calcium Carbonate-Vitamin D (CALCIUM-VITAMIN D)  500-200 MG-UNIT tablet Take 1 tablet by mouth daily.     . carvedilol (COREG) 25 MG tablet Take 50 mg by mouth 2 (two) times daily.     . citalopram (CELEXA) 40 MG tablet Take 40 mg by mouth daily.    Marland Kitchen doxazosin (CARDURA) 8 MG tablet Take 4 mg by mouth at bedtime.     . ferrous sulfate 325 (65 FE) MG tablet Take 325 mg by mouth every Monday, Wednesday, and Friday.     . hydrALAZINE (APRESOLINE) 100 MG tablet Take 100 mg by mouth 3 (three) times daily.    . insulin aspart (NOVOLOG) 100 UNIT/ML injection Inject 3 Units into the skin 3 (three) times daily before meals.     . insulin glargine (LANTUS) 100 UNIT/ML injection Inject 18 Units into the skin at bedtime.     Marland Kitchen lisinopril (PRINIVIL,ZESTRIL) 10 MG tablet Take 10 mg by mouth daily.    Marland Kitchen oxybutynin (DITROPAN) 5 MG tablet Take 5 mg by mouth 3 (three) times daily.    . pantoprazole (PROTONIX) 40 MG tablet Take 40 mg by mouth daily. Take on an empty stomach 30 minutes before a meal    . polyethylene glycol (MIRALAX / GLYCOLAX) packet Take 17 g by mouth daily as needed for mild constipation or moderate constipation.    . potassium chloride SA (K-DUR,KLOR-CON) 20 MEQ tablet Take 20 mEq by mouth daily.    Marland Kitchen spironolactone (ALDACTONE) 25 MG tablet Take 50 mg by mouth daily.     Marland Kitchen sulfaSALAzine (AZULFIDINE) 500 MG tablet Take 2,000 mg by mouth 2 (two) times daily after a meal.     . tamsulosin (FLOMAX) 0.4 MG CAPS capsule Take 0.4 mg by mouth at bedtime.     . docusate sodium (COLACE) 100 MG capsule Take 100 mg by mouth daily as needed for mild constipation.    . furosemide (LASIX) 40 MG tablet Take 1 tablet (40 mg total) by mouth every other day. (Patient not taking: Reported on 02/06/2016) 30 tablet   . hydrocerin (EUCERIN) CREA Apply 1 application topically daily.    Marland Kitchen ibuprofen (ADVIL,MOTRIN) 800 MG tablet Take 800 mg by mouth 3 (three) times daily as needed for headache or moderate pain (with food).    Marland Kitchen ipratropium (ATROVENT) 0.02 % nebulizer  solution Take 2.5 mLs (0.5 mg total) by nebulization every 2 (two) hours as needed for wheezing or shortness of breath. (Patient not taking: Reported on 02/06/2016) 75 mL 0  . isosorbide mononitrate (IMDUR) 30 MG 24 hr tablet Take 30 mg by mouth daily.    Marland Kitchen lisinopril (PRINIVIL,ZESTRIL) 5 MG tablet Take 1 tablet (5 mg total) by mouth daily. (Patient not taking: Reported on 02/06/2016) 30 tablet 0  . loperamide (IMODIUM) 2 MG capsule Take 1 capsule (2 mg total) by mouth 4 (four) times daily as needed for diarrhea or loose stools. (Patient not taking: Reported on 02/06/2016) 12 capsule 0  . metolazone (ZAROXOLYN) 5 MG tablet Take 5 mg by mouth every 3 (three) days.    . Multiple Vitamin (MULTIVITAMIN WITH MINERALS) TABS tablet Take 1 tablet by mouth daily.    . ondansetron (ZOFRAN ODT) 4 MG disintegrating tablet Take 1 tablet (4 mg total) by mouth every 8 (eight) hours as needed for nausea  or vomiting. (Patient not taking: Reported on 02/06/2016) 10 tablet 0  . silver sulfADIAZINE (SILVADENE) 1 % cream Apply 1 application topically daily as needed (skin care).       Home: Home Living Family/patient expects to be discharged to:: Private residence Living Arrangements: Alone Type of Home: Apartment Home Access: Level entry Home Layout: One [place]: Walker - 2 wheels Additional Comments: Unsure of accuracy of hx as pt is giving inconsistant head nods and inconsistant answers to yes/no questions.    Lives With: Alone   Functional History: Prior Function Level of Independence: Independent with assistive device(s) Comments: Pt reports he walked with a walker, but not sure how accurate his answers are at this time.   Functional Status:  Mobility: Bed Mobility Overal bed mobility: Needs Assistance Bed Mobility: Supine to Sit Rolling: Mod assist, Min assist Supine to sit: Supervision General bed mobility comments: Supervision for safety, pt coming out of right side of bed and when given  extra time, and use of railing he was able to work his way up to sitting.  Transfers Overall transfer level: Needs assistance Equipment used: Rolling walker (2 wheeled) Transfers: Sit to/from Stand Sit to Stand: +2 physical assistance, Mod assist Stand pivot transfers: +2 safety/equipment, Mod assist General transfer comment: Two person mod assist to power up to standing from low bed.  Verbal cues and manual assist to get right foot in safe position to get to standing and assist for hand placement on RW on right (PT has to hold hand onto walker). Verbal cues for safe hand placement and speed during transitions.  Ambulation/Gait Ambulation/Gait assistance: +2 physical assistance, +2 safety/equipment, Min assist Ambulation Distance (Feet): 55 Feet Assistive device: Rolling walker (2 wheeled) Gait Pattern/deviations: Step-to pattern, Decreased weight shift to right, Decreased dorsiflexion - right, Decreased step length - right General Gait Details: Pt was able to ambulate a short distance with RW.  PT had to physically hold his right hand onto the RW, but pt able to progress right foot forward with steppage gait pattern and improved right knee hyper extension today.  Pt initally was two person assist with gait, but transitioned to one person assist and chair to follow.  Gait velocity: decreased Gait velocity interpretation: Below normal speed for age/gender    ADL: ADL Overall ADL's : Needs assistance/impaired Eating/Feeding: Set up, Sitting Eating/Feeding Details (indicate cue type and reason): cues to beware of drinking coffee it was hot Grooming: Wash/dry face, Minimal assistance, Sitting Upper Body Bathing: Moderate assistance Lower Body Bathing: Total assistance Upper Body Dressing : Moderate assistance Upper Body Dressing Details (indicate cue type and reason): cues to dress R UE Lower Body Dressing: Total assistance Lower Body Dressing Details (indicate cue type and reason): 2-3 plus  pitting edema present in feet Toilet Transfer: +2 for physical assistance, Moderate assistance, Stand-pivot General ADL Comments: Pt transferred to chair from EOB. pt with R inattention  Cognition: Cognition Overall Cognitive Status: Impaired/Different from baseline Arousal/Alertness: Awake/alert (somewhat drowsy) Orientation Level: Oriented to person Attention: Sustained Sustained Attention: Impaired Sustained Attention Impairment: Verbal basic, Functional basic Memory:  (will assess further) Awareness: Impaired Awareness Impairment: Intellectual impairment, Emergent impairment, Anticipatory impairment Problem Solving: Impaired Problem Solving Impairment: Verbal basic, Functional basic Safety/Judgment: Impaired Cognition Arousal/Alertness: Awake/alert Behavior During Therapy: WFL for tasks assessed/performed Overall Cognitive Status: Impaired/Different from baseline Area of Impairment: Attention, Following commands, Safety/judgement, Awareness, Problem solving Current Attention Level: Sustained Following Commands: Follows one step commands consistently, Follows multi-step commands inconsistently, Follows  multi-step commands with increased time Safety/Judgement: Decreased awareness of safety, Decreased awareness of deficits Awareness: Intellectual Problem Solving: Difficulty sequencing, Requires verbal cues, Requires tactile cues General Comments: Pt better attending to his right side today.  Coming across midline with and without cues.    Physical Exam: Blood pressure (!) 173/100, pulse 84, temperature 98.7 F (37.1 C), temperature source Oral, resp. rate 20, height 5' 11" (1.803 m), weight 103 kg (227 lb), SpO2 100 %. Physical Exam  Vitals reviewed. Constitutional: He appears well-developed and well-nourished.  HENT:  Head: Normocephalic and atraumatic.  Eyes: Conjunctivae are normal. Right eye exhibits no discharge. Left eye exhibits no discharge.  Neck: Normal range of  motion. Neck supple. No tracheal deviation present. No thyromegaly present.  Cardiovascular: Normal rate and regular rhythm.   Respiratory: Effort normal and breath sounds normal. No respiratory distress.  GI: Soft. Bowel sounds are normal. He exhibits no distension. There is no tenderness. There is no rebound and no guarding.  Musculoskeletal: He exhibits edema. He exhibits no tenderness.  Substantial R>L lymphedema 3+  Neurological:  Neurological: He is alert.  Makes eye contact with examiner.  Severe dysarthria with some expressive langauge deficits Some delay in processing.  Left gaze preference.  Follows simple commands.  RUE 3+/5 prox to distal with apraxia RLE: 3/5 proximal to distal with apraxia and limited due to edema LUE/LLE: 4-/5 proximal to distal   Skin: Skin is warm and dry.  Psychiatric: He has a normal mood and affect. His behavior is normal.    Results for orders placed or performed during the hospital encounter of 02/05/16 (from the past 48 hour(s))  Glucose, capillary     Status: Abnormal   Collection Time: 02/06/16 12:36 PM  Result Value Ref Range   Glucose-Capillary 123 (H) 65 - 99 mg/dL  Glucose, capillary     Status: Abnormal   Collection Time: 02/06/16  4:47 PM  Result Value Ref Range   Glucose-Capillary 109 (H) 65 - 99 mg/dL  Glucose, capillary     Status: Abnormal   Collection Time: 02/06/16  8:56 PM  Result Value Ref Range   Glucose-Capillary 103 (H) 65 - 99 mg/dL   Comment 1 Notify RN    Comment 2 Document in Chart   Glucose, capillary     Status: Abnormal   Collection Time: 02/07/16 12:01 AM  Result Value Ref Range   Glucose-Capillary 106 (H) 65 - 99 mg/dL   Comment 1 Notify RN    Comment 2 Document in Chart   Glucose, capillary     Status: None   Collection Time: 02/07/16  4:33 AM  Result Value Ref Range   Glucose-Capillary 66 65 - 99 mg/dL   Comment 1 Notify RN    Comment 2 Document in Chart   Glucose, capillary     Status: None    Collection Time: 02/07/16  5:01 AM  Result Value Ref Range   Glucose-Capillary 78 65 - 99 mg/dL   Comment 1 Notify RN    Comment 2 Document in Chart   Basic metabolic panel     Status: Abnormal   Collection Time: 02/07/16  7:30 AM  Result Value Ref Range   Sodium 143 135 - 145 mmol/L   Potassium 3.4 (L) 3.5 - 5.1 mmol/L   Chloride 110 101 - 111 mmol/L   CO2 28 22 - 32 mmol/L   Glucose, Bld 70 65 - 99 mg/dL   BUN 21 (H) 6 - 20 mg/dL  Creatinine, Ser 1.58 (H) 0.61 - 1.24 mg/dL   Calcium 8.7 (L) 8.9 - 10.3 mg/dL   GFR calc non Af Amer 48 (L) >60 mL/min   GFR calc Af Amer 56 (L) >60 mL/min    Comment: (NOTE) The eGFR has been calculated using the CKD EPI equation. This calculation has not been validated in all clinical situations. eGFR's persistently <60 mL/min signify possible Chronic Kidney Disease.    Anion gap 5 5 - 15  Glucose, capillary     Status: None   Collection Time: 02/07/16  7:33 AM  Result Value Ref Range   Glucose-Capillary 67 65 - 99 mg/dL  Glucose, capillary     Status: Abnormal   Collection Time: 02/07/16 11:57 AM  Result Value Ref Range   Glucose-Capillary 110 (H) 65 - 99 mg/dL  Glucose, capillary     Status: Abnormal   Collection Time: 02/07/16  4:52 PM  Result Value Ref Range   Glucose-Capillary 122 (H) 65 - 99 mg/dL  Glucose, capillary     Status: Abnormal   Collection Time: 02/07/16  8:24 PM  Result Value Ref Range   Glucose-Capillary 125 (H) 65 - 99 mg/dL  Glucose, capillary     Status: None   Collection Time: 02/08/16 12:55 AM  Result Value Ref Range   Glucose-Capillary 66 65 - 99 mg/dL  Glucose, capillary     Status: None   Collection Time: 02/08/16  2:01 AM  Result Value Ref Range   Glucose-Capillary 94 65 - 99 mg/dL  Glucose, capillary     Status: None   Collection Time: 02/08/16  3:47 AM  Result Value Ref Range   Glucose-Capillary 92 65 - 99 mg/dL   Dg Swallowing Func-speech Pathology  Result Date: 02/06/2016 Objective Swallowing  Evaluation: Type of Study: MBS-Modified Barium Swallow Study Patient Details Name: Heath Tesler MRN: [id number] Date of Birth: 1960-06-15 Today's Date: 02/06/2016 Time: SLP Start Time (ACUTE ONLY): 1017-SLP Stop Time (ACUTE ONLY): 1033 SLP Time Calculation (min) (ACUTE ONLY): 16 min Past Medical History: Past Medical History: Diagnosis Date . BPH (benign prostatic hyperplasia)  . CHF (congestive heart failure) ([facility])  . Diabetes mellitus without complication ([facility])  . Diabetic retinopathy associated with type 2 diabetes mellitus ([facility])  . Hypertension  . Lymphedema  . OSA (obstructive sleep apnea)  . TIA (transient ischemic attack)  Past Surgical History: Past Surgical History: Procedure Laterality Date . BLADDER SURGERY   . CARDIAC CATHETERIZATION   HPI: Gedalya Jim a 56 y.o.malewith medical history significant for chronic CHF, insulin-dependent diabetes mellitus, hypertension, depression, OSA, ulcerative colitis, prior CVA with minimal residual right-sided weakness who presents to the emergency department with dysarthria, confusion, lethargy, and right facial droop. Patient lives alone and family resides in [facility], [place] and [place]. Chest x-ray is notable for new cardiomegaly with pulmonary vascular congestion and slight bilateral interstitial edema. MRI acute small to moderate LEFT frontal lobe/ MCA territory infarct corresponding to perfusion abnormality. 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 Old basal ganglia. MBS recommended following BSE. No Data Recorded Assessment / Plan / Recommendation CHL IP CLINICAL IMPRESSIONS 02/06/2016 Therapy Diagnosis Moderate oral phase dysphagia;Mild pharyngeal phase dysphagia Clinical Impression Labial spillage, delayed transit and intermittent piecemeal pattern describe pt's oral function. Swallow initiation delayed to pyriform sinuses due to decreased sensation without penetration or aspiration with consecutive cup and straw sips and minimal vallecular  residue. Upon clinical observation during bedside swallow and following MBS, pt has multiple swallows and intermittent mild throat clears with  the appearance of difficulty however pt safe with consecutive larger sips. Risk for pocketing present therefore recommend Dys 2 texture and thin liquids, crush meds, straws allowed, sit upright and check right side oral cavity for pocketing. ST will continue to follow.     Impact on safety and function Mild aspiration risk   CHL IP TREATMENT RECOMMENDATION 02/06/2016 Treatment Recommendations Therapy as outlined in treatment plan below   Prognosis 02/06/2016 Prognosis for Safe Diet Advancement Good Barriers to Reach Goals -- Barriers/Prognosis Comment -- CHL IP DIET RECOMMENDATION 02/06/2016 SLP Diet Recommendations Dysphagia 2 (Fine chop) solids;Thin liquid Liquid Administration via Straw;Cup Medication Administration Crushed with puree Compensations Slow rate;Small sips/bites;Minimize environmental distractions;Lingual sweep for clearance of pocketing Postural Changes Seated upright at 90 degrees   CHL IP OTHER RECOMMENDATIONS 02/06/2016 Recommended Consults -- Oral Care Recommendations Oral care BID Other Recommendations --   CHL IP FOLLOW UP RECOMMENDATIONS 02/06/2016 Follow up Recommendations Inpatient Rehab   CHL IP FREQUENCY AND DURATION 02/06/2016 Speech Therapy Frequency (ACUTE ONLY) min 2x/week Treatment Duration 2 weeks      CHL IP ORAL PHASE 02/06/2016 Oral Phase Impaired Oral - Pudding Teaspoon -- Oral - Pudding Cup -- Oral - Honey Teaspoon -- Oral - Honey Cup -- Oral - Nectar Teaspoon -- Oral - Nectar Cup -- Oral - Nectar Straw -- Oral - Thin Teaspoon -- Oral - Thin Cup Delayed oral transit;Piecemeal swallowing Oral - Thin Straw Delayed oral transit;Piecemeal swallowing Oral - Puree -- Oral - Mech Soft -- Oral - Regular Delayed oral transit;Weak lingual manipulation Oral - Multi-Consistency -- Oral - Pill -- Oral Phase - Comment --  CHL IP PHARYNGEAL PHASE 02/06/2016 Pharyngeal  Phase Impaired Pharyngeal- Pudding Teaspoon -- Pharyngeal -- Pharyngeal- Pudding Cup -- Pharyngeal -- Pharyngeal- Honey Teaspoon -- Pharyngeal -- Pharyngeal- Honey Cup -- Pharyngeal -- Pharyngeal- Nectar Teaspoon -- Pharyngeal -- Pharyngeal- Nectar Cup Delayed swallow initiation-pyriform sinuses Pharyngeal -- Pharyngeal- Nectar Straw -- Pharyngeal -- Pharyngeal- Thin Teaspoon -- Pharyngeal -- Pharyngeal- Thin Cup Delayed swallow initiation-pyriform sinuses;Pharyngeal residue - valleculae Pharyngeal -- Pharyngeal- Thin Straw Delayed swallow initiation-pyriform sinuses;Pharyngeal residue - valleculae Pharyngeal -- Pharyngeal- Puree -- Pharyngeal -- Pharyngeal- Mechanical Soft -- Pharyngeal -- Pharyngeal- Regular WFL Pharyngeal -- Pharyngeal- Multi-consistency -- Pharyngeal -- Pharyngeal- Pill -- Pharyngeal -- Pharyngeal Comment --  CHL IP CERVICAL ESOPHAGEAL PHASE 02/06/2016 Cervical Esophageal Phase WFL Pudding Teaspoon -- Pudding Cup -- Honey Teaspoon -- Honey Cup -- Nectar Teaspoon -- Nectar Cup -- Nectar Straw -- Thin Teaspoon -- Thin Cup -- Thin Straw -- Puree -- Mechanical Soft -- Regular -- Multi-consistency -- Pill -- Cervical Esophageal Comment -- No flowsheet data found. [place] Siren 02/06/2016, 11:14 AM Orbie Pyo Colvin Caroli.Ed CCC-SLP Pager [phone]                  Medical Problem List and Plan: 1.  Left hemiparesis, aphasia, dysphagia secondary to left MCA infarct 2.  DVT Prophylaxis/Anticoagulation: Lovenox 40 mg daily 3. Pain Management: Tylenol as needed 4. Mood: Celexa 40 mg daily 5. Neuropsych: This patient is capable of making decisions on he is own behalf. 6. Skin/Wound Care: Routine skin checks 7. Fluids/Electrolytes/Nutrition: Routine I&O with follow-up chemistries  Dysphagia 1 thin, advance as tolerated 8. Acute on chronic combined congestive heart failure. Continue Lasix. Monitor for any signs of fluid overload. Daily weights. 9. Hypertension. Cardura 4 mg daily, Imdur 30  mg daily, Coreg 50 mg twice a day, lisinopril 20 mg daily. Monitor with increased mobility 10. Diabetes mellitus  with peripheral neuropathy. Hemoglobin A1c 6.4. Lantus insulin 5 units daily. Check blood sugars before meals and at bedtime. Diabetic teaching 11. Ulcerative colitis. Sulfasalazine 2000 mg twice a day 12. BPH. Flomax 0.4 mg daily at bedtime. Check PVR 3 13. Hyperlipidemia. Lipitor 14. CRI stage III. Creatinine 1.50-1.58 15. LE edema/Lymphedema: Cont diuretics and compression.  Post Admission Physician Evaluation: 1. Preadmission assessment reviewed and changes made below. 2. Functional deficits secondary  to  left MCA infarct. 3. Patient is admitted to receive collaborative, interdisciplinary care between the physiatrist, rehab nursing staff, and therapy team. 4. Patient's level of medical complexity and substantial therapy needs in context of that medical necessity cannot be provided at a lesser intensity of care such as a SNF. 5. Patient has experienced substantial functional loss from his/her baseline which was documented above under the "Functional History" and "Functional Status" headings.  Judging by the patient's diagnosis, physical exam, and functional history, the patient has potential for functional progress which will result in measurable gains while on inpatient rehab.  These gains will be of substantial and practical use upon discharge  in facilitating mobility and self-care at the household level. 6. Physiatrist will provide 24 hour management of medical needs as well as oversight of the therapy plan/treatment and provide guidance as appropriate regarding the interaction of the two. 7. The Preadmission Screening has been reviewed and patient status is unchanged unless otherwise stated above. 8. 24 hour rehab nursing will assist with bladder management, bowel management, safety, disease management, medication administration and patient education  and help integrate therapy  concepts, techniques,education, etc. 9. PT will assess and treat for/with: Lower extremity strength, range of motion, stamina, balance, functional mobility, safety, adaptive techniques and equipment, coping skills, pain control, stroke education.   Goals are: Min A. 10. OT will assess and treat for/with: ADL's, functional mobility, safety, upper extremity strength, adaptive techniques and equipment, ego support, and community reintegration.   Goals are: Min A. Therapy may proceed with showering this patient. 11. SLP will assess and treat for/with: speech, cognition, swallowing.  Goals are: Mod I/Supervision. 12. Case Management and Social Worker will assess and treat for psychological issues and discharge planning. 76. Team conference will be held weekly to assess progress toward goals and to determine barriers to discharge. 14. Patient will receive at least 3 hours of therapy per day at least 5 days per week. 15. ELOS: 18-23 days.       16. Prognosis:  good     Delice Lesch, MD, Mellody Drown Cathlyn Parsons., PA-C 02/08/2016

## 2016-02-11 NOTE — Progress Notes (Signed)
Kyle Oyster, MD Physician Addendum Physical Medicine and Rehabilitation  Consult Note Date of Service: 02/07/2016 6:27 AM  Related encounter: ED to Hosp-Admission (Discharged) from 02/05/2016 in [facility]     Expand All Collapse All   [] Hide copied text [] Hover for attribution information      Physical Medicine and Rehabilitation Consult Reason for Consult: Left frontal MCA territory infarct Referring Physician: [facility]   HPI: [name] is a 56 y.o. right handed male with history of diastolic congestive heart failure,OSA, diabetes mellitus and peripheral neuropathy, hypertension, TIA maintained on aspirin. Per chart review patient lives alone. Reported to be independent with assistive device prior to admission. One level apartment. Presented 02/05/2016 with slurred speech, altered mental status and right-sided weakness. Cranial CT scan negative for acute changes. 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 CT cerebral perfusion showed acute small to moderate-sized left MCA territory infarct. Patient did not receive TPA. CT angiogram head and neck showed acute left M3 occlusion as well as scattered atheromatous plaque. MRI completed 02/07/2016 showed moderate left frontal lobe MCA territory infarct as well as old bilateral basal ganglia infarcts and right occipital lobe. Echocardiogram pending. Venous Doppler studies negative for DVT. Maintained on aspirin for CVA prophylaxis. Subcutaneous Lovenox for DVT prophylaxis. Dysphagia# 2 thin liquid diets. Physical and occupational therapy evaluations completed with recommendations of physical medicine rehabilitation consult.   Review of Systems  Unable to perform ROS: Language   Past Medical History:  Diagnosis Date  . BPH (benign prostatic hyperplasia)   . CHF (congestive heart failure) (HCC)   . Diabetes mellitus without complication (HCC)   . Diabetic retinopathy associated  with type 2 diabetes mellitus (HCC)   . Hypertension   . Lymphedema   . OSA (obstructive sleep apnea)   . TIA (transient ischemic attack)         Past Surgical History:  Procedure Laterality Date  . BLADDER SURGERY    . CARDIAC CATHETERIZATION          Family History  Problem Relation Age of Onset  . Cancer Mother   . Cancer Father    Social History:  reports that he has never smoked. He has never used smokeless tobacco. He reports that he does not drink alcohol or use drugs. Allergies:       Allergies  Allergen Reactions  . Chicken Allergy Other (See Comments)    Sneezing   . Sildenafil Other (See Comments)    Per the [facility], this is possibly an allergy   Medications Prior to Admission  Medication Sig Dispense Refill  . acetaminophen (TYLENOL) 325 MG tablet Take 650 mg by mouth 3 (three) times daily as needed for mild pain, moderate pain or headache.     Marland Kitchen amLODipine (NORVASC) 10 MG tablet Take 10 mg by mouth daily.    Marland Kitchen aspirin 325 MG tablet Take 325 mg by mouth daily.    Marland Kitchen atorvastatin (LIPITOR) 40 MG tablet Take 20 mg by mouth at bedtime.     . bumetanide (BUMEX) 2 MG tablet Take 2 mg by mouth 2 (two) times daily.    . Calcium Carbonate-Vitamin D (CALCIUM-VITAMIN D) 500-200 MG-UNIT tablet Take 1 tablet by mouth daily.     . carvedilol (COREG) 25 MG tablet Take 50 mg by mouth 2 (two) times daily.     . citalopram (CELEXA) 40 MG tablet Take 40 mg by mouth daily.    Marland Kitchen doxazosin (CARDURA) 8 MG  tablet Take 4 mg by mouth at bedtime.     . ferrous sulfate 325 (65 FE) MG tablet Take 325 mg by mouth every Monday, Wednesday, and Friday.     . hydrALAZINE (APRESOLINE) 100 MG tablet Take 100 mg by mouth 3 (three) times daily.    . insulin aspart (NOVOLOG) 100 UNIT/ML injection Inject 3 Units into the skin 3 (three) times daily before meals.     . insulin glargine (LANTUS) 100 UNIT/ML injection Inject 18 Units into the skin at bedtime.       Marland Kitchen lisinopril (PRINIVIL,ZESTRIL) 10 MG tablet Take 10 mg by mouth daily.    Marland Kitchen oxybutynin (DITROPAN) 5 MG tablet Take 5 mg by mouth 3 (three) times daily.    . pantoprazole (PROTONIX) 40 MG tablet Take 40 mg by mouth daily. Take on an empty stomach 30 minutes before a meal    . polyethylene glycol (MIRALAX / GLYCOLAX) packet Take 17 g by mouth daily as needed for mild constipation or moderate constipation.    . potassium chloride SA (K-DUR,KLOR-CON) 20 MEQ tablet Take 20 mEq by mouth daily.    Marland Kitchen spironolactone (ALDACTONE) 25 MG tablet Take 50 mg by mouth daily.     Marland Kitchen sulfaSALAzine (AZULFIDINE) 500 MG tablet Take 2,000 mg by mouth 2 (two) times daily after a meal.     . tamsulosin (FLOMAX) 0.4 MG CAPS capsule Take 0.4 mg by mouth at bedtime.     . docusate sodium (COLACE) 100 MG capsule Take 100 mg by mouth daily as needed for mild constipation.    . furosemide (LASIX) 40 MG tablet Take 1 tablet (40 mg total) by mouth every other day. (Patient not taking: Reported on 02/06/2016) 30 tablet   . hydrocerin (EUCERIN) CREA Apply 1 application topically daily.    Marland Kitchen ibuprofen (ADVIL,MOTRIN) 800 MG tablet Take 800 mg by mouth 3 (three) times daily as needed for headache or moderate pain (with food).    Marland Kitchen ipratropium (ATROVENT) 0.02 % nebulizer solution Take 2.5 mLs (0.5 mg total) by nebulization every 2 (two) hours as needed for wheezing or shortness of breath. (Patient not taking: Reported on 02/06/2016) 75 mL 0  . isosorbide mononitrate (IMDUR) 30 MG 24 hr tablet Take 30 mg by mouth daily.    Marland Kitchen lisinopril (PRINIVIL,ZESTRIL) 5 MG tablet Take 1 tablet (5 mg total) by mouth daily. (Patient not taking: Reported on 02/06/2016) 30 tablet 0  . loperamide (IMODIUM) 2 MG capsule Take 1 capsule (2 mg total) by mouth 4 (four) times daily as needed for diarrhea or loose stools. (Patient not taking: Reported on 02/06/2016) 12 capsule 0  . metolazone (ZAROXOLYN) 5 MG tablet Take 5 mg by mouth every 3  (three) days.    . Multiple Vitamin (MULTIVITAMIN WITH MINERALS) TABS tablet Take 1 tablet by mouth daily.    . ondansetron (ZOFRAN ODT) 4 MG disintegrating tablet Take 1 tablet (4 mg total) by mouth every 8 (eight) hours as needed for nausea or vomiting. (Patient not taking: Reported on 02/06/2016) 10 tablet 0  . silver sulfADIAZINE (SILVADENE) 1 % cream Apply 1 application topically daily as needed (skin care).       Home: Home Living Family/patient expects to be discharged to:: Private residence Living Arrangements: Alone Type of Home: Apartment Home Access: Level entry Home Layout: One level Home Equipment: Walker - 2 wheels Additional Comments: Unsure of accuracy of hx as pt is giving inconsistant head nods and inconsistant answers to yes/no questions.  Lives With: Alone  Functional History: Prior Function Level of Independence: Independent with assistive device(s) Comments: Pt reports he walked with a walker, but not sure how accurate his answers are at this time.  Functional Status:  Mobility: Bed Mobility Overal bed mobility: Needs Assistance Bed Mobility: Supine to Sit, Rolling Rolling: Mod assist, Min assist Supine to sit: +2 for safety/equipment, Min assist General bed mobility comments: Min assist to help progress legs EOB and support trunk during transition to sitting.   Min assist to roll to his right side and mod assist to roll to his left side.  Transfers Overall transfer level: Needs assistance Equipment used: 1 person hand held assist Transfers: Sit to/from Stand, Stand Pivot Transfers Sit to Stand: +2 physical assistance, Mod assist Stand pivot transfers: +2 safety/equipment, Mod assist General transfer comment: Two person mod assist to support trunk, anteriorly weight shift, block and stabilize his right leg.  Pt was able to take steps around to the elevated chair to his left by dragging his hyperextended  R leg behind him.   Ambulation/Gait General  Gait Details: Did not assess today.  Might be safer with third person assisting given his size and his right inattention.     ADL: ADL Overall ADL's : Needs assistance/impaired Eating/Feeding: Set up, Sitting Eating/Feeding Details (indicate cue type and reason): cues to beware of drinking coffee it was hot Grooming: Wash/dry face, Minimal assistance, Sitting Upper Body Bathing: Moderate assistance Lower Body Bathing: Total assistance Upper Body Dressing : Moderate assistance Upper Body Dressing Details (indicate cue type and reason): cues to dress R UE Lower Body Dressing: Total assistance Lower Body Dressing Details (indicate cue type and reason): 2-3 plus pitting edema present in feet Toilet Transfer: +2 for physical assistance, Moderate assistance, Stand-pivot General ADL Comments: Pt transferred to chair from EOB. pt with R inattention  Cognition: Cognition Overall Cognitive Status: Impaired/Different from baseline Arousal/Alertness: Awake/alert (somewhat drowsy) Orientation Level: Oriented to person, Disoriented to situation, Disoriented to time Attention: Sustained Sustained Attention: Impaired Sustained Attention Impairment: Verbal basic, Functional basic Memory:  (will assess further) Awareness: Impaired Awareness Impairment: Intellectual impairment, Emergent impairment, Anticipatory impairment Problem Solving: Impaired Problem Solving Impairment: Verbal basic, Functional basic Safety/Judgment: Impaired Cognition Arousal/Alertness: Awake/alert Behavior During Therapy: WFL for tasks assessed/performed Overall Cognitive Status: Impaired/Different from baseline Area of Impairment: Following commands, Safety/judgement, Awareness, Problem solving Following Commands: Follows one step commands consistently (on the left side) Safety/Judgement: Decreased awareness of safety, Decreased awareness of deficits Awareness: Intellectual Problem Solving: Difficulty sequencing,  Requires verbal cues, Requires tactile cues General Comments: Pt with left gaze and right inattention.  Needs cues to come past midline and attend to the right.  He is able to help manage his arm after you help him find it visually.  Not sure of accuracy of yes / no questions and family not present to confirm. pt incontinent of bowel and bladder on arrival with adult diaper. pt does not request (A) and rather sleeping with soiled diaper. Pt needed cues to have (A). Pt reports "yes" with head when asked "should you get back to bed by yourself " but when asked to verbalized states "no"   Blood pressure (!) 154/106, pulse 63, temperature 97.5 F (36.4 C), temperature source Oral, resp. rate 18, height 5\' 11"  (1.803 m), weight 108.5 kg (239 lb 3.2 oz), SpO2 100 %. Physical Exam  Constitutional: He appears well-developed.  HENT:  Head: Normocephalic.  Eyes:  Pupils reactive to light  Neck: Normal range of  motion. Neck supple. No thyromegaly present.  Cardiovascular: Normal rate, regular rhythm and normal heart sounds.   Respiratory:  Fair inspiratory effort. He does have some upper airway congestion  GI: Soft. Bowel sounds are normal. He exhibits no distension.  Musculoskeletal: He exhibits edema (substantial R>L lymphedema 3+).  Neurological: He is alert.  Makes eye contact with examiner. Moderate to severe dysarthria with some expressive langauge deficits also.. Some delay in processing. He does show a left gaze preference. Follows simple commands. RUE 3/5 prox to 2/5 distal. RLE: 1/5 hf, ke and 2/5 ankles. LUE 3-4/5. LLE 2- to 2+/5.   Skin: Skin is warm and dry.    Lab Results Last 24 Hours       Results for orders placed or performed during the hospital encounter of 02/05/16 (from the past 24 hour(s))  Glucose, capillary     Status: Abnormal   Collection Time: 02/06/16 12:36 PM  Result Value Ref Range   Glucose-Capillary 123 (H) 65 - 99 mg/dL  Glucose, capillary     Status: Abnormal     Collection Time: 02/06/16  4:47 PM  Result Value Ref Range   Glucose-Capillary 109 (H) 65 - 99 mg/dL  Glucose, capillary     Status: Abnormal   Collection Time: 02/06/16  8:56 PM  Result Value Ref Range   Glucose-Capillary 103 (H) 65 - 99 mg/dL   Comment 1 Notify RN    Comment 2 Document in Chart   Glucose, capillary     Status: Abnormal   Collection Time: 02/07/16 12:01 AM  Result Value Ref Range   Glucose-Capillary 106 (H) 65 - 99 mg/dL   Comment 1 Notify RN    Comment 2 Document in Chart   Glucose, capillary     Status: None   Collection Time: 02/07/16  4:33 AM  Result Value Ref Range   Glucose-Capillary 66 65 - 99 mg/dL   Comment 1 Notify RN    Comment 2 Document in Chart   Glucose, capillary     Status: None   Collection Time: 02/07/16  5:01 AM  Result Value Ref Range   Glucose-Capillary 78 65 - 99 mg/dL   Comment 1 Notify RN    Comment 2 Document in Chart       Imaging Results (Last 48 hours)  Ct Angio Head W Or Wo Contrast  Result Date: 02/05/2016 CLINICAL DATA:  Initial evaluation for acute right-sided weakness, slurred speech, aphasia. EXAM: CT ANGIOGRAPHY HEAD AND NECK TECHNIQUE: Multidetector CT imaging of the head and neck was performed using the standard protocol during bolus administration of intravenous contrast. Multiplanar CT image reconstructions and MIPs were obtained to evaluate the vascular anatomy. Carotid stenosis measurements (when applicable) are obtained utilizing NASCET criteria, using the distal internal carotid diameter as the denominator. CONTRAST:  90 cc of Isovue 370. COMPARISON:  Prior CT from earlier the same day. FINDINGS: CT HEAD FINDINGS Brain: Atrophy with chronic microvascular ischemic disease again noted. Remote lacunar infarcts within the bilateral basal ganglia. Now evident is subtle evolving hypodensity involving the left insular cortex extending towards the left frontal operculum (series 201, image 18). This is  more evident as compared to previous examination performed earlier the same day. No significant mass effect. No acute intracranial hemorrhage. Hyperdensity at the level of the left sylvian fissure suspicious for thrombus (series 201, image 19). No mass lesion, midline shift, or mass effect. No hydrocephalus. No extra-axial fluid collection. Vascular: Vascular calcifications within the carotid siphons. Hyperdensity at  the level the left sylvian fissure suspicious for thrombus (series 201, image 19). No other hyperdense vessel. Skull: Scalp soft tissues within normal limits.  Calvarium intact. Sinuses: Globes and orbital soft tissues within normal limits. Patient is status post scleral banding on the left. Mild opacity within the right ethmoidal air cells. Paranasal sinuses are otherwise clear. No mastoid effusion. CTA NECK FINDINGS Aortic arch: Visualized aortic arch of normal caliber with normal branch pattern. Scattered atheromatous plaque within the arch itself and about the origin of the great vessels without flow-limiting stenosis. Visualized subclavian arteries are widely patent. Right carotid system: Right common carotid artery patent from its origin to the bifurcation. Scattered calcified plaque about the right bifurcation without flow-limiting stenosis. Right ICA patent distally to the skullbase without stenosis, dissection, or occlusion. Left carotid system: Left common carotid artery patent from its origin to the bifurcation. Scattered calcified plaque about the left bifurcation without flow-limiting stenosis. Left ICA patent distally to the skullbase without stenosis, dissection, or occlusion. Vertebral arteries: Both of the vertebral arteries arise from the subclavian arteries. The the vertebral arteries widely patent without stenosis, dissection, or occlusion. Focal plaque noted at the origin of the right vertebral artery without high-grade stenosis. Skeleton: No acute osseous abnormality. No worrisome  lytic or blastic osseous lesions. Other neck: Visualized soft tissues of the neck demonstrate no acute abnormality. Upper chest: Visualized upper mediastinum within normal limits. Layering right pleural effusion. Probable smaller left effusion. Scattered interlobular septal thickening within the partially visualized lungs, suggesting edema. Review of the MIP images confirms the above findings CTA HEAD FINDINGS Anterior circulation: Petrous segments patent bilaterally. Scattered calcified atheromatous plaque within the cavernous/ supraclinoid ICAs with mild to moderate multifocal narrowing. ICA termini patent. Left A1 segment dominant and widely patent. Right A1 segment hypoplastic but patent as well. Anterior communicating artery normal. Anterior cerebral arteries patent to their distal aspects. Right M1 segment patent without stenosis or occlusion. Right MCA bifurcation normal. Right MCA branches well opacified to their distal aspects. Left M1 segment patent without stenosis or occlusion. Left MCA bifurcation normal. No proximal M2 branch occlusion. There is abrupt occlusion of a left M3 branch, middle division (series 602, image 80). This corresponds with previously seen hyperdensity on noncontrast portion of this exam. No other arterial branch occlusion. Left MCA branches otherwise patent. Posterior circulation: Scattered plaque within the V4 segments bilaterally, right greater than left with mild multifocal narrowing. Posterior inferior cerebral arteries patent bilaterally. Basilar artery widely patent. Superior cerebellar arteries patent bilaterally. Both of the posterior cerebral arteries supplied mainly via the basilar artery and are patent to their distal aspects. Venous sinuses: Patent. Anatomic variants: No significant anatomic variant. No aneurysm or vascular malformation. Delayed phase: Not performed. Review of the MIP images confirms the above findings IMPRESSION: 1. Acute left M3 occlusion with  evolving left MCA territory infarct, best seen on perfusion portion of this exam. 2. Scattered atheromatous plaque throughout the carotid siphons with mild to moderate multifocal narrowing. 3. Atheromatous plaque about the carotid bifurcations bilaterally without flow-limiting stenosis. 4. Large layering right pleural effusion, with smaller left pleural effusion. Interlobular septal thickening suggestive of pulmonary edema. Critical Value/emergent results were called by telephone at the time of interpretation on 02/05/2016 at 6:19 pm to Dr. Ritta Slot , who verbally acknowledged these results. Electronically Signed   By: Rise Mu M.D.   On: 02/05/2016 19:04   Ct Head Wo Contrast  Result Date: 02/05/2016 CLINICAL DATA:  Altered mental status, aphasia, right-sided  weakness EXAM: CT HEAD WITHOUT CONTRAST TECHNIQUE: Contiguous axial images were obtained from the base of the skull through the vertex without intravenous contrast. COMPARISON:  MRI 05/19/2000 FINDINGS: Brain: Old lacunar infarct in the left basal ganglia. Old infarct and encephalomalacia in the right occipital lobe. No acute intracranial abnormality. Specifically, no hemorrhage, hydrocephalus, mass lesion, acute infarction, or significant intracranial injury. Vascular: No hyperdense vessel or unexpected calcification. Skull: No acute calvarial abnormality. Sinuses/Orbits: Visualized paranasal sinuses and mastoids clear. Orbital soft tissues unremarkable. Other: None IMPRESSION: Old left basal ganglia and right occipital infarct. No acute intracranial abnormality. Electronically Signed   By: Charlett Nose M.D.   On: 02/05/2016 15:25   Ct Angio Neck W Or Wo Contrast  Result Date: 02/05/2016 CLINICAL DATA:  Initial evaluation for acute right-sided weakness, slurred speech, aphasia. EXAM: CT ANGIOGRAPHY HEAD AND NECK TECHNIQUE: Multidetector CT imaging of the head and neck was performed using the standard protocol during bolus  administration of intravenous contrast. Multiplanar CT image reconstructions and MIPs were obtained to evaluate the vascular anatomy. Carotid stenosis measurements (when applicable) are obtained utilizing NASCET criteria, using the distal internal carotid diameter as the denominator. CONTRAST:  90 cc of Isovue 370. COMPARISON:  Prior CT from earlier the same day. FINDINGS: CT HEAD FINDINGS Brain: Atrophy with chronic microvascular ischemic disease again noted. Remote lacunar infarcts within the bilateral basal ganglia. Now evident is subtle evolving hypodensity involving the left insular cortex extending towards the left frontal operculum (series 201, image 18). This is more evident as compared to previous examination performed earlier the same day. No significant mass effect. No acute intracranial hemorrhage. Hyperdensity at the level of the left sylvian fissure suspicious for thrombus (series 201, image 19). No mass lesion, midline shift, or mass effect. No hydrocephalus. No extra-axial fluid collection. Vascular: Vascular calcifications within the carotid siphons. Hyperdensity at the level the left sylvian fissure suspicious for thrombus (series 201, image 19). No other hyperdense vessel. Skull: Scalp soft tissues within normal limits.  Calvarium intact. Sinuses: Globes and orbital soft tissues within normal limits. Patient is status post scleral banding on the left. Mild opacity within the right ethmoidal air cells. Paranasal sinuses are otherwise clear. No mastoid effusion. CTA NECK FINDINGS Aortic arch: Visualized aortic arch of normal caliber with normal branch pattern. Scattered atheromatous plaque within the arch itself and about the origin of the great vessels without flow-limiting stenosis. Visualized subclavian arteries are widely patent. Right carotid system: Right common carotid artery patent from its origin to the bifurcation. Scattered calcified plaque about the right bifurcation without  flow-limiting stenosis. Right ICA patent distally to the skullbase without stenosis, dissection, or occlusion. Left carotid system: Left common carotid artery patent from its origin to the bifurcation. Scattered calcified plaque about the left bifurcation without flow-limiting stenosis. Left ICA patent distally to the skullbase without stenosis, dissection, or occlusion. Vertebral arteries: Both of the vertebral arteries arise from the subclavian arteries. The the vertebral arteries widely patent without stenosis, dissection, or occlusion. Focal plaque noted at the origin of the right vertebral artery without high-grade stenosis. Skeleton: No acute osseous abnormality. No worrisome lytic or blastic osseous lesions. Other neck: Visualized soft tissues of the neck demonstrate no acute abnormality. Upper chest: Visualized upper mediastinum within normal limits. Layering right pleural effusion. Probable smaller left effusion. Scattered interlobular septal thickening within the partially visualized lungs, suggesting edema. Review of the MIP images confirms the above findings CTA HEAD FINDINGS Anterior circulation: Petrous segments patent bilaterally. Scattered calcified  atheromatous plaque within the cavernous/ supraclinoid ICAs with mild to moderate multifocal narrowing. ICA termini patent. Left A1 segment dominant and widely patent. Right A1 segment hypoplastic but patent as well. Anterior communicating artery normal. Anterior cerebral arteries patent to their distal aspects. Right M1 segment patent without stenosis or occlusion. Right MCA bifurcation normal. Right MCA branches well opacified to their distal aspects. Left M1 segment patent without stenosis or occlusion. Left MCA bifurcation normal. No proximal M2 branch occlusion. There is abrupt occlusion of a left M3 branch, middle division (series 602, image 80). This corresponds with previously seen hyperdensity on noncontrast portion of this exam. No other  arterial branch occlusion. Left MCA branches otherwise patent. Posterior circulation: Scattered plaque within the V4 segments bilaterally, right greater than left with mild multifocal narrowing. Posterior inferior cerebral arteries patent bilaterally. Basilar artery widely patent. Superior cerebellar arteries patent bilaterally. Both of the posterior cerebral arteries supplied mainly via the basilar artery and are patent to their distal aspects. Venous sinuses: Patent. Anatomic variants: No significant anatomic variant. No aneurysm or vascular malformation. Delayed phase: Not performed. Review of the MIP images confirms the above findings IMPRESSION: 1. 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 2. Scattered atheromatous plaque throughout the carotid siphons with mild to moderate multifocal narrowing. 3. Atheromatous plaque about the carotid bifurcations bilaterally without flow-limiting stenosis. 4. Large layering right pleural effusion, with smaller left pleural effusion. Interlobular septal thickening suggestive of pulmonary edema. Critical Value/emergent results were called by telephone at the time of interpretation on 02/05/2016 at 6:19 pm to Dr. Ritta Slot , who verbally acknowledged these results. Electronically Signed   By: Rise Mu M.D.   On: 02/05/2016 19:04   Mr Brain Wo Contrast  Result Date: 02/06/2016 CLINICAL DATA:  Acute on chronic RIGHT-sided weakness, aphasia. Follow-up M3 occlusion. Assess stroke versus PRES. History of hypertension and diabetes. EXAM: MRI HEAD WITHOUT CONTRAST TECHNIQUE: Multiplanar, multiecho pulse sequences of the brain and surrounding structures were obtained without intravenous contrast. COMPARISON:  CT HEAD February 05, 2016 and MRI of the head May 20, 2015 FINDINGS: BRAIN: Confluent reduced diffusion measuring up to 5.8 cm in LEFT frontal lobe, involving the insula and operculum with  low ADC values. Non contiguous patchy reduced diffusion LEFT anterior frontal lobe. No susceptibility artifact to suggest acute hemorrhage. The ventricles and sulci are normal for patient's age. Small area RIGHT occipital lobe encephalomalacia. Old LEFT pontomedullary infarcts. Old bilateral basal ganglia lacunar infarcts. Symmetric basal ganglia susceptibility artifact most consistent with old hemorrhage. Patchy supratentorial and pontine white matter FLAIR T2 hyperintensities. Mild ventriculomegaly on the basis of global parenchymal brain volume loss. No intraparenchymal mass effect, masses. No abnormal extra-axial fluid collections. VASCULAR: Normal major intracranial vascular flow voids present at skull base. SKULL AND UPPER CERVICAL SPINE: Empty sella. No suspicious calvarial bone marrow signal. Craniocervical junction maintained. SINUSES/ORBITS: Mild paranasal sinus mucosal thickening. The included ocular globes and orbital contents are non-suspicious. Status post LEFT scleral banding and ocular lens implant. OTHER: None. IMPRESSION: Acute small to moderate LEFT frontal lobe/ MCA territory infarct corresponding to perfusion abnormality. 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 Old basal ganglia and pontomedullary lacunar infarcts. Old RIGHT occipital lobe/ PCA territory infarct. Mild to moderate chronic small vessel ischemic disease. Mild atrophy. Electronically Signed   By: Awilda Metro M.D.   On: 02/06/2016 06:01   Ct Cerebral Perfusion W Contrast  Result Date: 02/05/2016 CLINICAL DATA:  Initial  evaluation for acute right-sided weakness. EXAM: CT PERFUSION BRAIN TECHNIQUE: Multiphase CT imaging of the brain was performed following IV bolus contrast injection. Subsequent parametric perfusion maps were calculated using RAPID software. CONTRAST:  90 cc of Isovue 370. FINDINGS: CT Brain Perfusion Findings: CBF (<30%) Volume: 21 ccmL Perfusion (Tmax>6.0s) volume: 92 ccmL Mismatch Volume: 71  ccmL Infarction Location:Core infarct primarily involves the left frontal operculum/anterior left frontal lobe, with some extension towards the left insula and left temporal lobe. Please note that while the mismatch volume is measured as 71 cc of this exam, there is felt to be some artifact on the T max perfusion images, with elevated T-max seen not only around the core infarct, but also within the right cerebral hemisphere as well as the right cerebellar hemisphere. Overall, while there is likely some penumbra about the area of core infarction, the recorded 71 cc is likely not accurate as to the volume of actual mismatch (which is likely significantly less, although difficult to accurately quantify on this exam). IMPRESSION: Acute small to moderate-sized left MCA territory infarct as above. Critical Value/emergent results were called by telephone at the time of interpretation on 02/05/2016 at 6:19 pm to Dr. Ritta Slot , who verbally acknowledged these results. Electronically Signed   By: Rise Mu M.D.   On: 02/05/2016 18:27   Dg Chest Port 1 View  Result Date: 02/05/2016 CLINICAL DATA:  Acute altered mental status. EXAM: PORTABLE CHEST 1 VIEW COMPARISON:  07/26/2015 FINDINGS: The patient has developed cardiomegaly with pulmonary vascular congestion and slight bilateral interstitial pulmonary edema. No acute bone abnormality. IMPRESSION: New cardiomegaly with pulmonary vascular congestion and slight bilateral interstitial pulmonary edema. Electronically Signed   By: Francene Boyers M.D.   On: 02/05/2016 16:01   Dg Swallowing Func-speech Pathology  Result Date: 02/06/2016 Objective Swallowing Evaluation: Type of Study: MBS-Modified Barium Swallow Study Patient Details Name: Kie Calvin MRN: [id number] Date of Birth: 1960-08-22 Today's Date: 02/06/2016 Time: SLP Start Time (ACUTE ONLY): 1017-SLP Stop Time (ACUTE ONLY): 1033 SLP Time Calculation (min) (ACUTE ONLY): 16 min Past Medical  History: Past Medical History: Diagnosis Date . BPH (benign prostatic hyperplasia)  . CHF (congestive heart failure) (HCC)  . Diabetes mellitus without complication (HCC)  . Diabetic retinopathy associated with type 2 diabetes mellitus (HCC)  . Hypertension  . Lymphedema  . OSA (obstructive sleep apnea)  . TIA (transient ischemic attack)  Past Surgical History: Past Surgical History: Procedure Laterality Date . BLADDER SURGERY   . CARDIAC CATHETERIZATION   HPI: [name] a 56 y.o.malewith medical history significant for chronic CHF, insulin-dependent diabetes mellitus, hypertension, depression, OSA, ulcerative colitis, prior CVA with minimal residual right-sided weakness who presents to the emergency department with dysarthria, confusion, lethargy, and right facial droop. Patient lives alone and family resides in [facility], [place] and [place]. Chest x-ray is notable for new cardiomegaly with pulmonary vascular congestion and slight bilateral interstitial edema. MRI acute small to moderate LEFT frontal lobe/ MCA territory infarct corresponding to perfusion abnormality. 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 Old basal ganglia. MBS recommended following BSE. No Data Recorded Assessment / Plan / Recommendation CHL IP CLINICAL IMPRESSIONS 02/06/2016 Therapy Diagnosis Moderate oral phase dysphagia;Mild pharyngeal phase dysphagia Clinical Impression Labial spillage, delayed transit and intermittent piecemeal pattern describe pt's oral function. Swallow initiation delayed to pyriform sinuses due to decreased sensation without penetration or aspiration with consecutive cup and straw sips and minimal vallecular residue. Upon clinical observation during bedside swallow and following MBS, pt has multiple  swallows and intermittent mild throat clears with the appearance of difficulty however pt safe with consecutive larger sips. Risk for pocketing present therefore recommend Dys 2 texture and thin liquids,  crush meds, straws allowed, sit upright and check right side oral cavity for pocketing. ST will continue to follow.     Impact on safety and function Mild aspiration risk   CHL IP TREATMENT RECOMMENDATION 02/06/2016 Treatment Recommendations Therapy as outlined in treatment plan below   Prognosis 02/06/2016 Prognosis for Safe Diet Advancement Good Barriers to Reach Goals -- Barriers/Prognosis Comment -- CHL IP DIET RECOMMENDATION 02/06/2016 SLP Diet Recommendations Dysphagia 2 (Fine chop) solids;Thin liquid Liquid Administration via Straw;Cup Medication Administration Crushed with puree Compensations Slow rate;Small sips/bites;Minimize environmental distractions;Lingual sweep for clearance of pocketing Postural Changes Seated upright at 90 degrees   CHL IP OTHER RECOMMENDATIONS 02/06/2016 Recommended Consults -- Oral Care Recommendations Oral care BID Other Recommendations --   CHL IP FOLLOW UP RECOMMENDATIONS 02/06/2016 Follow up Recommendations Inpatient Rehab   CHL IP FREQUENCY AND DURATION 02/06/2016 Speech Therapy Frequency (ACUTE ONLY) min 2x/week Treatment Duration 2 weeks      CHL IP ORAL PHASE 02/06/2016 Oral Phase Impaired Oral - Pudding Teaspoon -- Oral - Pudding Cup -- Oral - Honey Teaspoon -- Oral - Honey Cup -- Oral - Nectar Teaspoon -- Oral - Nectar Cup -- Oral - Nectar Straw -- Oral - Thin Teaspoon -- Oral - Thin Cup Delayed oral transit;Piecemeal swallowing Oral - Thin Straw Delayed oral transit;Piecemeal swallowing Oral - Puree -- Oral - Mech Soft -- Oral - Regular Delayed oral transit;Weak lingual manipulation Oral - Multi-Consistency -- Oral - Pill -- Oral Phase - Comment --  CHL IP PHARYNGEAL PHASE 02/06/2016 Pharyngeal Phase Impaired Pharyngeal- Pudding Teaspoon -- Pharyngeal -- Pharyngeal- Pudding Cup -- Pharyngeal -- Pharyngeal- Honey Teaspoon -- Pharyngeal -- Pharyngeal- Honey Cup -- Pharyngeal -- Pharyngeal- Nectar Teaspoon -- Pharyngeal -- Pharyngeal- Nectar Cup Delayed swallow initiation-pyriform sinuses  Pharyngeal -- Pharyngeal- Nectar Straw -- Pharyngeal -- Pharyngeal- Thin Teaspoon -- Pharyngeal -- Pharyngeal- Thin Cup Delayed swallow initiation-pyriform sinuses;Pharyngeal residue - valleculae Pharyngeal -- Pharyngeal- Thin Straw Delayed swallow initiation-pyriform sinuses;Pharyngeal residue - valleculae Pharyngeal -- Pharyngeal- Puree -- Pharyngeal -- Pharyngeal- Mechanical Soft -- Pharyngeal -- Pharyngeal- Regular WFL Pharyngeal -- Pharyngeal- Multi-consistency -- Pharyngeal -- Pharyngeal- Pill -- Pharyngeal -- Pharyngeal Comment --  CHL IP CERVICAL ESOPHAGEAL PHASE 02/06/2016 Cervical Esophageal Phase WFL Pudding Teaspoon -- Pudding Cup -- Honey Teaspoon -- Honey Cup -- Nectar Teaspoon -- Nectar Cup -- Nectar Straw -- Thin Teaspoon -- Thin Cup -- Thin Straw -- Puree -- Mechanical Soft -- Regular -- Multi-consistency -- Pill -- Cervical Esophageal Comment -- No flowsheet data found. Royce Macadamia 02/06/2016, 11:14 AM Breck Coons Lonell Face.Ed CCC-SLP Pager [phone]                Assessment/Plan: Diagnosis: left MCA infarct in an obese patient who was living alone but failing to thrive prior to admit 1. Does the need for close, 24 hr/day medical supervision in concert with the patient's rehab needs make it unreasonable for this patient to be served in a less intensive setting? Yes 2. Co-Morbidities requiring supervision/potential complications: dm, htn, chf 3. Due to bladder management, bowel management, safety, skin/wound care, disease management, medication administration, pain management and patient education, does the patient require 24 hr/day rehab nursing? Yes 4. Does the patient require coordinated care of a physician, rehab nurse, PT (1-2 hrs/day, 5 days/week), OT (1-2 hrs/day, 5 days/week) and  SLP (1-2 hrs/day, 5 days/week) to address physical and functional deficits in the context of the above medical diagnosis(es)? Yes Addressing deficits in the following areas: balance, endurance,  locomotion, strength, transferring, bowel/bladder control, bathing, dressing, feeding, grooming, toileting, cognition, speech, language, swallowing and psychosocial support 5. Can the patient actively participate in an intensive therapy program of at least 3 hrs of therapy per day at least 5 days per week? Yes 6. The potential for patient to make measurable gains while on inpatient rehab is good 7. Anticipated functional outcomes upon discharge from inpatient rehab are modified independent and supervision  with PT, modified independent, supervision and min assist with OT, modified independent and supervision with SLP. 8. Estimated rehab length of stay to reach the above functional goals is: 20-24 days 9. Does the patient have adequate social supports and living environment to accommodate these discharge functional goals? Yes 10. Anticipated D/C setting: Home 11. Anticipated post D/C treatments: HH therapy and Outpatient therapy 12. Overall Rehab/Functional Prognosis: good  RECOMMENDATIONS: This patient's condition is appropriate for continued rehabilitative care in the following setting: CIR Patient has agreed to participate in recommended program. Yes Note that insurance prior authorization may be required for reimbursement for recommended care.  Comment: I spoke with brother,patient at length. We discussed options moving forward. The most realistic plan would be discharge to an ALF after inpatient rehab admission...possibly a facility which has tiered levels of care. The brother would like him to live in the [place] area closer to his home in [facility].  Rehab Admissions Coordinator to follow up.  Thanks,  Kyle Oyster, MD, [place] [facility]    Charlton Amor., PA-C 02/07/2016    Revision History                             Routing History

## 2016-02-11 NOTE — Progress Notes (Signed)
Physical Therapy Treatment Patient Details Name: [name] MRN: [id number] DOB: 09-16-1960 Today's Date: 02/11/2016    History of Present Illness 56 y.o. male admitted to [facility] on 02/05/16 for right facial droop and right sided weakness found to have a L MCA CVA.  Pt with significant PMHx of TIA, HTN, DM with diabetic retinopathy, and CHF.    PT Comments    Pt performed increased mobility progressing gait but remains to require +2 assistance for safety.  Pt speaking more during session.  Pt will continue to benefit from CIR placement to address deficits.   Follow Up Recommendations  CIR     Equipment Recommendations  3in1 (PT);Rolling walker with 5" wheels;Other (comment)    Recommendations for Other Services       Precautions / Restrictions Precautions Precautions: Fall Precaution Comments: right sided weakness Restrictions Weight Bearing Restrictions: No    Mobility  Bed Mobility Overal bed mobility: Needs Assistance Bed Mobility: Supine to Sit Rolling: Min assist;+2 for physical assistance         General bed mobility comments: Cues for hand placement on rail and cues to reach with RUE.  Pt performed sit to supine with assist for trunk and assist with lifting B LEs against gravity.    Transfers Overall transfer level: Needs assistance Equipment used: Rolling walker (2 wheeled) Transfers: Sit to/from Stand Sit to Stand: +2 physical assistance;Mod assist Stand pivot transfers: +2 safety/equipment;Mod assist       General transfer comment: Cues for hand placement, patient initiates pushing with RUE.  Poor strength in wrist requiring assist.    Ambulation/Gait Ambulation/Gait assistance: +2 physical assistance;Mod assist Ambulation Distance (Feet): 95 Feet Assistive device: Rolling walker (2 wheeled) Gait Pattern/deviations: Step-through pattern;Wide base of support;Trunk flexed;Shuffle;Drifts right/left Gait velocity: decreased Gait velocity interpretation:  Below normal speed for age/gender General Gait Details: Pt remains to require assist with RUE.  Cues for sequencing and forward gaze, required increased assistance to turn RW.  ER rotation of RLE observed.  Assist to avoid obstacles in halls.     Stairs            Wheelchair Mobility    Modified Rankin (Stroke Patients Only)       Balance Overall balance assessment: Needs assistance   Sitting balance-Leahy Scale: Fair Sitting balance - Comments: seated puttting socks on with assist from OT     Standing balance-Leahy Scale: Poor Standing balance comment: needs external assist in standing.                     Cognition Arousal/Alertness: Awake/alert Behavior During Therapy: WFL for tasks assessed/performed Overall Cognitive Status: Impaired/Different from baseline Area of Impairment: Attention;Following commands;Safety/judgement;Awareness;Problem solving   Current Attention Level: Sustained   Following Commands: Follows one step commands consistently;Follows multi-step commands inconsistently;Follows multi-step commands with increased time Safety/Judgement: Decreased awareness of safety;Decreased awareness of deficits Awareness: Intellectual Problem Solving: Difficulty sequencing;Requires verbal cues;Requires tactile cues General Comments: Intermittent attention to RUE today.      Exercises      General Comments        Pertinent Vitals/Pain Pain Assessment: No/denies pain    Home Living                      Prior Function            PT Goals (current goals can now be found in the care plan section) Acute Rehab PT Goals Patient Stated Goal: Wants to walk Potential  to Achieve Goals: Good Progress towards PT goals: Progressing toward goals    Frequency           PT Plan Current plan remains appropriate    Co-evaluation PT/OT/SLP Co-Evaluation/Treatment: Yes Reason for Co-Treatment: Complexity of the patient's impairments  (multi-system involvement);For patient/therapist safety PT goals addressed during session: Mobility/safety with mobility;Balance OT goals addressed during session: ADL's and self-care;Proper use of Adaptive equipment and DME     End of Session Equipment Utilized During Treatment: Gait belt Activity Tolerance: Patient limited by fatigue Patient left: in chair;with call bell/phone within reach;with chair alarm set     Time: 1415-1444 PT Time Calculation (min) (ACUTE ONLY): 29 min  Charges:  $Gait Training: 8-22 mins                    G Codes:      Florestine Avers 2016/02/29, 3:58 PM  Joycelyn Rua, PTA pager [phone]

## 2016-02-12 ENCOUNTER — Inpatient Hospital Stay (HOSPITAL_COMMUNITY): Payer: Medicare Other | Admitting: Physical Therapy

## 2016-02-12 ENCOUNTER — Inpatient Hospital Stay (HOSPITAL_COMMUNITY): Payer: Medicare Other | Admitting: Speech Pathology

## 2016-02-12 ENCOUNTER — Inpatient Hospital Stay (HOSPITAL_COMMUNITY): Payer: Medicare Other | Admitting: Occupational Therapy

## 2016-02-12 DIAGNOSIS — G8191 Hemiplegia, unspecified affecting right dominant side: Secondary | ICD-10-CM

## 2016-02-12 DIAGNOSIS — I63512 Cerebral infarction due to unspecified occlusion or stenosis of left middle cerebral artery: Secondary | ICD-10-CM

## 2016-02-12 LAB — CBC WITH DIFFERENTIAL/PLATELET
BASOS PCT: 0 %
Basophils Absolute: 0 10*3/uL (ref 0.0–0.1)
Eosinophils Absolute: 0.2 10*3/uL (ref 0.0–0.7)
Eosinophils Relative: 2 %
HEMATOCRIT: 35.3 % — AB (ref 39.0–52.0)
HEMOGLOBIN: 11.3 g/dL — AB (ref 13.0–17.0)
LYMPHS ABS: 1.1 10*3/uL (ref 0.7–4.0)
Lymphocytes Relative: 18 %
MCH: 28.5 pg (ref 26.0–34.0)
MCHC: 32 g/dL (ref 30.0–36.0)
MCV: 89.1 fL (ref 78.0–100.0)
MONOS PCT: 7 %
Monocytes Absolute: 0.4 10*3/uL (ref 0.1–1.0)
NEUTROS ABS: 4.6 10*3/uL (ref 1.7–7.7)
NEUTROS PCT: 73 %
Platelets: 189 10*3/uL (ref 150–400)
RBC: 3.96 MIL/uL — ABNORMAL LOW (ref 4.22–5.81)
RDW: 15.9 % — ABNORMAL HIGH (ref 11.5–15.5)
WBC: 6.3 10*3/uL (ref 4.0–10.5)

## 2016-02-12 LAB — COMPREHENSIVE METABOLIC PANEL
ALBUMIN: 2.2 g/dL — AB (ref 3.5–5.0)
ALT: 15 U/L — AB (ref 17–63)
AST: 25 U/L (ref 15–41)
Alkaline Phosphatase: 161 U/L — ABNORMAL HIGH (ref 38–126)
Anion gap: 6 (ref 5–15)
BUN: 18 mg/dL (ref 6–20)
CHLORIDE: 106 mmol/L (ref 101–111)
CO2: 33 mmol/L — AB (ref 22–32)
CREATININE: 1.56 mg/dL — AB (ref 0.61–1.24)
Calcium: 8.8 mg/dL — ABNORMAL LOW (ref 8.9–10.3)
GFR calc non Af Amer: 49 mL/min — ABNORMAL LOW (ref >60)
GFR, EST AFRICAN AMERICAN: 56 mL/min — AB (ref >60)
Glucose, Bld: 74 mg/dL (ref 65–99)
POTASSIUM: 3.9 mmol/L (ref 3.5–5.1)
Sodium: 145 mmol/L (ref 135–145)
Total Bilirubin: 0.5 mg/dL (ref 0.3–1.2)
Total Protein: 5.1 g/dL — ABNORMAL LOW (ref 6.5–8.1)

## 2016-02-12 LAB — GLUCOSE, CAPILLARY
GLUCOSE-CAPILLARY: 82 mg/dL (ref 65–99)
GLUCOSE-CAPILLARY: 149 mg/dL — AB (ref 65–99)
GLUCOSE-CAPILLARY: 163 mg/dL — AB (ref 65–99)
Glucose-Capillary: 105 mg/dL — ABNORMAL HIGH (ref 65–99)

## 2016-02-12 NOTE — Evaluation (Addendum)
Speech Language Pathology Assessment and Plan  Patient Details  Name: [name] MRN: [id number] Date of Birth: 1960-06-27  SLP Diagnosis: Aphasia;Apraxia;Dysphagia;Cognitive Impairments;Dysarthria  Rehab Potential: Good ELOS: 14-17 days     Today's Date: 02/12/2016 SLP Individual Time: 0800-0900 SLP Individual Time Calculation (min): 60 min    Problem List:  Patient Active Problem List   Diagnosis Date Noted  . 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 Expressive aphasia   . Left hemiparesis ([place])   . Depression   . Dysphagia, post-stroke   . Benign essential HTN   . Type 2 diabetes mellitus with peripheral neuropathy (HCC)   . Ulcerative colitis with complication ([place])   . Benign prostatic hyperplasia   . Hyperlipidemia   . Stage 3 chronic kidney disease   . Peripheral edema   . Cerebral infarction due to occlusion of left middle cerebral artery ([place])   . Hypertension   . Right sided weakness   . Acute on chronic congestive heart failure ([place])   . Stroke (cerebrum) ([place]) 02/05/2016  . Hypokalemia 02/05/2016  . Hypertensive urgency 02/05/2016  . Acute CVA (cerebrovascular accident) ([place]) 02/05/2016  . Anemia of chronic disease 05/21/2015  . Diabetes mellitus, type II, insulin dependent ([place]) 05/21/2015  . OSA ? compliacne 05/21/2015  . Homelessness 05/21/2015  . Weakness   . AKI (acute kidney injury) ([place]) 05/20/2015  . Essential hypertension 05/20/2015  . Acute on chronic combined systolic and diastolic HF (heart failure) ([place]) 05/20/2015  . Weakness of right lower extremity 05/20/2015   Past Medical History:  Past Medical History:  Diagnosis Date  . BPH (benign prostatic hyperplasia)   . CHF (congestive heart failure) ([place])   . Diabetes mellitus without complication ([place])   . Diabetic retinopathy associated with type 2 diabetes mellitus ([place])   . Hypertension   . Lymphedema   . OSA (obstructive sleep apnea)   . TIA (transient ischemic  attack)    Past Surgical History:  Past Surgical History:  Procedure Laterality Date  . BLADDER SURGERY    . CARDIAC CATHETERIZATION    . EP IMPLANTABLE DEVICE N/A 02/08/2016   Procedure: Loop Recorder Insertion;  Surgeon: Deboraha Sprang, MD;  Location: [facility];  Service: Cardiovascular;  Laterality: N/A;  . TEE WITHOUT CARDIOVERSION N/A 02/08/2016   Procedure: TRANSESOPHAGEAL ECHOCARDIOGRAM (TEE);  Surgeon: Thayer Headings, MD;  Location: [facility];  Service: Cardiovascular;  Laterality: N/A;    Assessment / Plan / Recommendation Clinical Impression   [name] a 56 y.o.right handed malewith history of diastolic congestive heart failure,OSA,diabetes mellitus and peripheral neuropathy, hypertension, TIA maintained on aspirin, chronic renal insufficiency creatinine baseline 1.50-1.58. Per chart review patient lives alone. Reported to be independent with assistive device prior to admission.  Presented 02/05/2016 with slurred speech, altered mental status and right-sided weakness.  Cranial CT scan negative for acute changes. 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 CT cerebral perfusion showed acute small to moderate-sized left MCA territory infarct.  MRI completed 02/07/2016 showed moderate left frontal lobe MCA territory infarct as well as old bilateral basal ganglia infarcts and right occipital lobe. Dysphagia# 2thin liquid diets. Patient was admitted for comprehensive rehabilitation program on 02/11/2016.  SLP evaluation completed on 02/12/2016 with the following results:   Pt presents with a moderate oral>pharyngeally based dysphagia resulting from right sided oral motor weakness and cognitive impairment which impact his attention to, containment, and transit of boluses.  Pt demonstrates oral holding prior to swallow  initiation with purees and solids and oral residue across all consistencies post swallow.   As a result, recommend that pt remain on his currently  prescribed diet with full supervision for use of swallowing precautions.   Pt also presents with a mild-moderate anomic aphasia and verbal apraxia impacting pt's functional communication at the phrase-sentence level.  Verbal errors are characterized by intermittent paraphasic errors.  More significantly impacting pt's functional communication is a moderately severe dysarthria resulting from oral motor weakness and decreased vocal intensity which leads to decreased intelligibility at the phrase-sentence level.   Furthermore, pt demonstrates significant cognitive impairments in the areas of attention to tasks, awareness of deficits, and visual scanning to the right of midline which impact all higher level cognitive processes.   Given the abovementioned deficits, pt would benefit from skilled ST while inpatient in order to maximize functional independence and reduce burden of care prior to discharge.  Anticipate that pt will need 24/7 supervision in addition to [place] follow up at next level of care.      Skilled Therapeutic Interventions          Cognitive-linguistic evaluation completed with results and recommendations reviewed with family.  Pt needed overall min-mod assist verbal cues for use of swallowing precautions during breakfast meal to clear residuals from the oral cavity, improve attention to boluses, and correct anterior labial loss of boluses. No overt s/s of aspiration were evident with purees and thin liquids or with trials of dys 2 textures.  Pt also needed min-mod assist verbal cues to monitor and correct verbal errors during structured naming tasks within evaluation.  All questions were answered to pt's satisfaction at this time.  Pt left in bed with bed alarm set and call bell within reach.       SLP Assessment  Patient will need skilled Speech Lanaguage Pathology Services during CIR admission    Recommendations  SLP Diet Recommendations: Dysphagia 1 (Puree);Thin Liquid Administration via:  Cup;Straw Medication Administration: Crushed with puree Supervision: Patient able to self feed;Full supervision/cueing for compensatory strategies Compensations: Slow rate;Small sips/bites;Minimize environmental distractions;Lingual sweep for clearance of pocketing Postural Changes and/or Swallow Maneuvers: Seated upright 90 degrees Oral Care Recommendations: Oral care BID Patient destination: [place] (SNF) Follow up Recommendations: Skilled Nursing facility;24 hour supervision/assistance Equipment Recommended: None recommended by SLP    SLP Frequency 3 to 5 out of 7 days   SLP Duration  SLP Intensity  SLP Treatment/Interventions 14-17 days   Minumum of 1-2 x/day, 30 to 90 minutes  Cognitive remediation/compensation;Cueing hierarchy;Dysphagia/aspiration precaution training;Environmental controls;Internal/external aids;Multimodal communication approach;Patient/family education;Functional tasks;Speech/Language facilitation    Pain Pain Assessment Pain Assessment: No/denies pain  Prior Functioning Cognitive/Linguistic Baseline: Information not available Type of Home: Apartment  Lives With: Alone Available Help at Discharge: [place]: Unemployed  Function:  Eating Eating   Modified Consistency Diet: Yes Eating Assist Level: Supervision or verbal cues;Set up assist for;Helper checks for pocketed food   Eating Set Up Assist For: Opening containers       Cognition Comprehension Comprehension assist level: Understands basic 75 - 89% of the time/ requires cueing 10 - 24% of the time  Expression   Expression assist level: Expresses basic 50 - 74% of the time/requires cueing 25 - 49% of the time. Needs to repeat parts of sentences.  Social Interaction Social Interaction assist level: Interacts appropriately 75 - 89% of the time - Needs redirection for appropriate language or to initiate interaction.  Problem Solving Problem solving assist  level: Solves basic 50 - 74% of the time/requires cueing 25 - 49% of the time  Memory Memory assist level: Recognizes or recalls 50 - 74% of the time/requires cueing 25 - 49% of the time   Short Term Goals: Week 1: SLP Short Term Goal 1 (Week 1): Pt will consume dys 1 textures and thin liquids with supervision verbal cues for use of swallowing precautions and minimal overt s/s of aspiration.   SLP Short Term Goal 2 (Week 1): Pt will consume therapeutic trials of dys 2 textures with min verbal cues for use of swallowing precautions over 2 consecutive sessions prior to advancement.   SLP Short Term Goal 3 (Week 1): Pt will visually scan to the right of midline during basic functional tasks in >75% of opportunities wtih supervision verbal cues.   SLP Short Term Goal 4 (Week 1): Pt will recognize and correct verbal errors in the moment during phrase/sentence level expression with min assist verbal cues.  SLP Short Term Goal 5 (Week 1): Pt will utilize increased vocal intensity and overarticulation to achieve intelligibility at the sentence level with min verbal cues.   SLP Short Term Goal 6 (Week 1): Pt will utilize compensatory word finding strategies at the phrase/sentence level with min assist verbal cues.    Refer to Care Plan for Long Term Goals  Recommendations for other services: None   Discharge Criteria: Patient will be discharged from SLP if patient refuses treatment 3 consecutive times without medical reason, if treatment goals not met, if there is a change in medical status, if patient makes no progress towards goals or if patient is discharged from hospital.  The above assessment, treatment plan, treatment alternatives and goals were discussed and mutually agreed upon: by patient  ,  L 02/12/2016, 4:16 PM  

## 2016-02-12 NOTE — Evaluation (Signed)
Occupational Therapy Assessment and Plan  Patient Details  Name: [name] MRN: [id number] Date of Birth: 1960-09-29  OT Diagnosis: abnormal posture, cognitive deficits, disturbance of vision, hemiplegia affecting dominant side and muscle weakness (generalized) Rehab Potential: Rehab Potential (ACUTE ONLY): Good ELOS: 14-16 days   Today's Date: 02/12/2016 OT Individual Time: 1000-1100 OT Individual Time Calculation (min): 60 min      Problem List:  Patient Active Problem List   Diagnosis Date Noted  . 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 Expressive aphasia   . Left hemiparesis ([place])   . Depression   . Dysphagia, post-stroke   . Benign essential HTN   . Type 2 diabetes mellitus with peripheral neuropathy (HCC)   . Ulcerative colitis with complication ([place])   . Benign prostatic hyperplasia   . Hyperlipidemia   . Stage 3 chronic kidney disease   . Peripheral edema   . Cerebral infarction due to occlusion of left middle cerebral artery ([place])   . Hypertension   . Right sided weakness   . Acute on chronic congestive heart failure ([place])   . Stroke (cerebrum) ([place]) 02/05/2016  . Hypokalemia 02/05/2016  . Hypertensive urgency 02/05/2016  . Acute CVA (cerebrovascular accident) ([place]) 02/05/2016  . Anemia of chronic disease 05/21/2015  . Diabetes mellitus, type II, insulin dependent ([place]) 05/21/2015  . OSA ? compliacne 05/21/2015  . Homelessness 05/21/2015  . Weakness   . AKI (acute kidney injury) ([place]) 05/20/2015  . Essential hypertension 05/20/2015  . Acute on chronic combined systolic and diastolic HF (heart failure) ([place]) 05/20/2015  . Weakness of right lower extremity 05/20/2015    Past Medical History:  Past Medical History:  Diagnosis Date  . BPH (benign prostatic hyperplasia)   . CHF (congestive heart failure) ([place])   . Diabetes mellitus without complication ([place])   . Diabetic retinopathy associated with type 2 diabetes mellitus ([place])   .  Hypertension   . Lymphedema   . OSA (obstructive sleep apnea)   . TIA (transient ischemic attack)    Past Surgical History:  Past Surgical History:  Procedure Laterality Date  . BLADDER SURGERY    . CARDIAC CATHETERIZATION    . EP IMPLANTABLE DEVICE N/A 02/08/2016   Procedure: Loop Recorder Insertion;  Surgeon: Deboraha Sprang, MD;  Location: [facility];  Service: Cardiovascular;  Laterality: N/A;  . TEE WITHOUT CARDIOVERSION N/A 02/08/2016   Procedure: TRANSESOPHAGEAL ECHOCARDIOGRAM (TEE);  Surgeon: Thayer Headings, MD;  Location: [facility] ENDOSCOPY;  Service: Cardiovascular;  Laterality: N/A;    Assessment & Plan Clinical Impression: Patient is a 56 y.o. year old male with recent admission to the hospital on on 02/05/16 for right facial droop and right sided weakness found to have a L MCA CVA. Pt with significant PMHx of TIA, HTN, DM with diabetic retinopathy, and CHF.Patient transferred to CIR on 02/11/2016 .    Patient currently requires total with basic self-care skills secondary to muscle weakness and muscle joint tightness, abnormal tone, unbalanced muscle activation, decreased coordination and decreased motor planning, decreased attention, decreased awareness, decreased problem solving, decreased safety awareness, decreased memory and delayed processing and decreased sitting balance, decreased standing balance, hemiplegia and decreased balance strategies.  Prior to hospitalization, patient could complete ADL  with modified independent .  Patient will benefit from skilled intervention to decrease level of assist with basic self-care skills prior to discharge likely to SNF with continued therpay given lack of caregiver suppport. .  Anticipate patient will require  minimal physical assistance and continued OT services in next venue of care. .  OT - End of Session Endurance Deficit: Yes Endurance Deficit Description: fatigue with ADL tasks OT Assessment Rehab Potential (ACUTE ONLY):  Good Barriers to Discharge: Decreased caregiver support Barriers to Discharge Comments: Limited family support OT Patient demonstrates impairments in the following area(s): Balance;Behavior;Cognition;Edema;Endurance;Motor;Perception;Safety;Vision;Sensory OT Basic ADL's Functional Problem(s): Eating;Grooming;Bathing;Dressing;Toileting OT Transfers Functional Problem(s): Tub/Shower;Toilet OT Additional Impairment(s): Fuctional Use of Upper Extremity OT Plan OT Intensity: Minimum of 1-2 x/day, 45 to 90 minutes OT Frequency: 5 out of 7 days OT Duration/Estimated Length of Stay: 14-16 days OT Treatment/Interventions: Balance/vestibular training;Cognitive remediation/compensation;Community reintegration;Discharge planning;DME/adaptive equipment instruction;Functional electrical stimulation;Functional mobility training;Neuromuscular re-education;Patient/family education;Self Care/advanced ADL retraining;Therapeutic Activities;Therapeutic Exercise;UE/LE Strength taining/ROM;UE/LE Coordination activities;Visual/perceptual remediation/compensation OT Self Feeding Anticipated Outcome(s): Mod I OT Basic Self-Care Anticipated Outcome(s): Supervision OT Toileting Anticipated Outcome(s): Supervision OT Bathroom Transfers Anticipated Outcome(s): Supervision OT Recommendation Recommendations for Other Services: Therapeutic Recreation consult Therapeutic Recreation Interventions: Outing/community reintergration Patient destination: [place] (SNF) Follow Up Recommendations: Skilled nursing facility   Skilled Therapeutic Intervention Initial eval completed with treatment provided to address functional transfers, improved sit<>stand, standing balance, functional use of R UE, and adapted bathing/dressing skills. Pt with expressive language difficulties and dysarthria making communication difficult. Pt followed one-step commands consistently with increased time. Pt transferred OOB with Mod A, Max A  squat-pivot to L to w/c 2/2 poor motor planning and R sided weakness. Max A stand-pivot <>tub transfer bench to L using grab bars and multimodal cues for safety and technique. Pt able to maintain sitting balance with Min A during bathing/dressing with Max A to maintain standing balance. Incorporated forced use of R UE during bathing/dressing. Pt left seated in wc at end of session with needs met.    OT Evaluation Precautions/Restrictions  Precautions Precautions: Fall Precaution Comments: right sided weakness Restrictions Weight Bearing Restrictions: No Pain Pain Assessment Pain Assessment: No/denies pain Home Living/Prior Functioning Home Living Family/patient expects to be discharged to:: Skilled nursing facility Living Arrangements: Alone Available Help at Discharge: [place] (planned DC to SNF or ALF) Type of Home: Apartment Home Access: Level entry Home Layout: One level Bathroom Shower/Tub: Tub/shower unit, Air cabin crew Accessibility: Yes Additional Comments: brother reports poor living conditions, hoarding, disawray that family was not aware of  Lives With: Alone IADL History Leisure and Hobbies: Pt reports he enjoys playing golf and reading Prior Function Level of Independence: Independent with basic ADLs, Independent with homemaking with ambulation  Able to Take Stairs?: No Driving: Yes Vocation: Unemployed Comments: pt used RW, reported independence with ADL and homemaking, however, likely not managing household well per family report ADL ADL ADL Comments: Please see functional navigator Vision/Perception  Vision- History Baseline Vision/History: Wears glasses (per pt report) Patient Visual Report: Other (comment) (Pt reports vision changes, to be further tested) Vision- Assessment Vision Assessment?: Vision impaired- to be further tested in functional context  Cognition Overall Cognitive Status: Impaired/Different  from baseline Arousal/Alertness: Awake/alert Orientation Level: Person;Place;Situation (difficult to understand 2/2 dysarthria) Person: Oriented Place: Oriented Situation: Oriented Year: 2018 Month: January Day of Week: Correct Memory: Impaired Memory Impairment: Storage deficit;Retrieval deficit Immediate Memory Recall: Sock;Blue;Bed Memory Recall: Sock Memory Recall Sock: Without Cue Attention: Sustained Sustained Attention: Impaired Sustained Attention Impairment: Verbal basic;Functional basic Awareness: Impaired Awareness Impairment: Intellectual impairment Problem Solving: Impaired Problem Solving Impairment: Verbal basic;Functional basic Safety/Judgment: Impaired Comments: right inattention  Sensation Sensation Light Touch: Appears Intact Hot/Cold: Appears Intact Proprioception: Impaired Detail Proprioception  Impaired Details: Impaired RUE;Impaired RLE Coordination Gross Motor Movements are Fluid and Coordinated: No Fine Motor Movements are Fluid and Coordinated: No Coordination and Movement Description: R hemiplegia Finger Nose Finger Test: Overshooting Motor  Motor Motor: Hemiplegia;Abnormal postural alignment and control Mobility  Bed Mobility Bed Mobility: Supine to Sit;Sit to Supine Supine to Sit: 5: Supervision;HOB flat Sit to Supine: 4: Min assist;HOB flat  Trunk/Postural Assessment  Cervical Assessment Cervical Assessment: Exceptions to [facility] (forward head) Thoracic Assessment Thoracic Assessment: Exceptions to [facility] (slight kyphosis) Lumbar Assessment Lumbar Assessment: Exceptions to [facility] (posterior pelvic tilt) Postural Control Postural Control: Deficits on evaluation Protective Responses: impaired  Balance Balance Balance Assessed: Yes Static Standing Balance Static Standing - Balance Support: Right upper extremity supported;During functional activity Static Standing - Level of Assistance: 4: Min assist Dynamic Standing Balance Dynamic Standing -  Balance Support: During functional activity;Right upper extremity supported Dynamic Standing - Level of Assistance: 2: Max assist Extremity/Trunk Assessment RUE Assessment RUE Assessment: Exceptions to [facility] LUE Assessment LUE Assessment: Exceptions to WFL LUE AROM (degrees) Overall AROM Left Upper Extremity: Deficits LUE Overall AROM Comments: Limited shoulder, elbow, wrist, and hand ROM, shoulder limited to ~80 LUE Strength LUE Overall Strength: Deficits LUE Overall Strength Comments: 3-/5   See Function Navigator for Current Functional Status.   Refer to Care Plan for Long Term Goals  Recommendations for other services: Therapeutic Recreation  Kitchen group and Outing/community reintegration   Discharge Criteria: Patient will be discharged from OT if patient refuses treatment 3 consecutive times without medical reason, if treatment goals not met, if there is a change in medical status, if patient makes no progress towards goals or if patient is discharged from hospital.  The above assessment, treatment plan, treatment alternatives and goals were discussed and mutually agreed upon: by patient  Valma Cava 02/12/2016, 8:30 PM

## 2016-02-12 NOTE — Progress Notes (Signed)
Social Work Assessment and Plan Social Work Assessment and Plan  Patient Details  Name: [name] MRN: [id number] Date of Birth: 1960-05-08  Today's Date: 02/12/2016  Problem List:  Patient Active Problem List   Diagnosis Date Noted  . 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 Expressive aphasia   . Left hemiparesis (HCC)   . Depression   . Dysphagia, post-stroke   . Benign essential HTN   . Type 2 diabetes mellitus with peripheral neuropathy (HCC)   . Ulcerative colitis with complication (HCC)   . Benign prostatic hyperplasia   . Hyperlipidemia   . Stage 3 chronic kidney disease   . Peripheral edema   . Cerebral infarction due to occlusion of left middle cerebral artery (HCC)   . Hypertension   . Right sided weakness   . Acute on chronic congestive heart failure (HCC)   . Stroke (cerebrum) (HCC) 02/05/2016  . Hypokalemia 02/05/2016  . Hypertensive urgency 02/05/2016  . Acute CVA (cerebrovascular accident) (HCC) 02/05/2016  . Anemia of chronic disease 05/21/2015  . Diabetes mellitus, type II, insulin dependent (HCC) 05/21/2015  . OSA ? compliacne 05/21/2015  . Homelessness 05/21/2015  . Weakness   . AKI (acute kidney injury) (HCC) 05/20/2015  . Essential hypertension 05/20/2015  . Acute on chronic combined systolic and diastolic HF (heart failure) (HCC) 05/20/2015  . Weakness of right lower extremity 05/20/2015   Past Medical History:  Past Medical History:  Diagnosis Date  . BPH (benign prostatic hyperplasia)   . CHF (congestive heart failure) (HCC)   . Diabetes mellitus without complication (HCC)   . Diabetic retinopathy associated with type 2 diabetes mellitus (HCC)   . Hypertension   . Lymphedema   . OSA (obstructive sleep apnea)   . TIA (transient ischemic attack)    Past Surgical History:  Past Surgical History:  Procedure Laterality Date  . BLADDER SURGERY    . CARDIAC CATHETERIZATION    . EP IMPLANTABLE DEVICE N/A 02/08/2016   Procedure: Loop Recorder Insertion;  Surgeon: Duke Salvia, MD;  [facility]: [facility] INVASIVE CV LAB;  Service: Cardiovascular;  Laterality: N/A;  . TEE WITHOUT CARDIOVERSION N/A 02/08/2016   Procedure: TRANSESOPHAGEAL ECHOCARDIOGRAM (TEE);  Surgeon: Vesta Mixer, MD;  Location: [facility] ENDOSCOPY;  Service: Cardiovascular;  Laterality: N/A;   Social History:  reports that he has never smoked. He has never used smokeless tobacco. He reports that he does not drink alcohol or use drugs.  Family / Support Systems Marital Status: Separated Patient Roles: Parent, Other (Comment) (Sibling) Children: 80 yo son in [place] with his Mother Other Supports: Mitchell-brother [phone]  [phone]   Velma-sister in-law  [phone] Anticipated Caregiver: facility-either SNF versus ALF Ability/Limitations of Caregiver: Brother works and can not provide 24 hr care Caregiver Availability: Other (Comment) (Pt is going to a NH upon DC from rehab) Family Dynamics: Close to brother who lives in [place] and he has a sister in [place] he talks to on the phone. His ex-wife and 68 yo son are in [place]. He has a few friends/neighbors who look out for him. Plan to be closer to his brother when he leaves here.  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 He  is also pt's POA and HCPOA-paperwork in the soft chart   Abuse/Neglect Physical Abuse: Denies Verbal Abuse: Denies Sexual Abuse: Denies Exploitation of patient/patient's resources: Denies Self-Neglect: Denies  Emotional Status Pt's affect, behavior adn adjustment  status: Pt is motivated to do well, he makes eye contact and nods like he understands. He has had two other stroke and recovered well enough to be alone in his apartment. This time he will need some assistance and someone to watch over him. Recent Psychosocial Issues: other health issues-past history of strokes-residual deficits-right side.  Unsure how well he was managing at home prior to admission, due to numerous admissions into the ER in the past 6 months Pyschiatric History: history of depression taking celexa. Unable to assess pt due to language deficits. He does make eye contact and smiles and nods. Will continue to monitor and re-assess when able. Will also ask team for their input. Substance Abuse History: No issues-according to brother or based on the chart review  Patient / Family Perceptions, Expectations & Goals Pt/Family understanding of illness & functional limitations: Brother can explain his stroke and deficits. He has spoken with the MD when he is here and sister in-law also has. Both feel they have a good understanding of the treatment plan for pt. Premorbid pt/family roles/activities: Father, brother, friend, veteran, retiree, etc Anticipated changes in roles/activities/participation: resume Pt/family expectations/goals: Sister in-law states: " We will make sure he has the care he needs in a facility and want him closer to [place] to be able to do this."  Brother states: " He needs someone closer to him to visit more."  [employer]: Other (Comment) ([facility]) Premorbid Home Care/DME Agencies: Other (Comment) (has rw) Transportation available at discharge: uses bus system Resource referrals recommended: Support group (specify)  Discharge Planning Living Arrangements: Alone Support Systems: Other relatives, Friends/neighbors Type of Residence: Private residence Insurance Resources: [employer], Media planner (specify) Print production planner) Surveyor, quantity  Resources: [employer] Screen Referred: No Living Expenses: Psychologist, sport and exercise Management: Patient Does the patient have any problems obtaining your medications?: No Home Management: Patient Patient/Family Preliminary Plans: Plans to go to a facility in [place] closer to his brother and sister in-law. Will see how much progress he will make to see what level fo care he will need either SNF or ALF. He would need to make much progress-supervision level- to be able to go to a ALF. Will contact [facility] social worker to see if can provide any resources for him in the [place]. Social Work Anticipated Follow Up Needs: SNF, ALF/IL, Support Group  Clinical Impression Pleasant gentleman who seems to follow the conversation and nods in agreement. Contacted sister in-law to obtain information due to brother is not able to get phone calls at work. He is aware if questions to contact me on work cell phone. Pt will probably be SNF at discharge unless he makes much progress while here. Will contact [facility] social worker to see if any resources pt is eligible for and work on this. Encouraged sister in-law to begin looking at facilities to be able to give this worker their preference so can pursue when medically ready to be moved. Lucy Chris 02/12/2016, 1:36 PM

## 2016-02-12 NOTE — Progress Notes (Signed)
Subjective/Complaints:   Objective: Vital Signs: Blood pressure 136/77, pulse (!) 57, temperature 97.4 F (36.3 C), temperature source Oral, resp. rate 16, height _0  (1.803 m), weight 95.3 kg (210 lb), SpO2 98 %. No results found. Results for orders placed or performed during the hospital encounter of 02/11/16 (from the past 72 hour(s))  CBC     Status: Abnormal   Collection Time: 02/11/16  3:57 PM  Result Value Ref Range   WBC 8.8 4.0 - 10.5 K/uL   RBC 4.00 (L) 4.22 - 5.81 MIL/uL   Hemoglobin 11.3 (L) 13.0 - 17.0 g/dL   HCT 35.6 (L) 39.0 - 52.0 %   MCV 89.0 78.0 - 100.0 fL   MCH 28.3 26.0 - 34.0 pg   MCHC 31.7 30.0 - 36.0 g/dL   RDW 15.9 (H) 11.5 - 15.5 %   Platelets 191 150 - 400 K/uL  Creatinine, serum     Status: Abnormal   Collection Time: 02/11/16  3:57 PM  Result Value Ref Range   Creatinine, Ser 1.59 (H) 0.61 - 1.24 mg/dL   GFR calc non Af Amer 48 (L) >60 mL/min   GFR calc Af Amer 55 (L) >60 mL/min    Comment: (NOTE) The eGFR has been calculated using the CKD EPI equation. This calculation has not been validated in all clinical situations. eGFR's persistently <60 mL/min signify possible Chronic Kidney Disease.   Glucose, capillary     Status: Abnormal   Collection Time: 02/11/16  5:15 PM  Result Value Ref Range   Glucose-Capillary 132 (H) 65 - 99 mg/dL  Glucose, capillary     Status: Abnormal   Collection Time: 02/11/16  9:37 PM  Result Value Ref Range   Glucose-Capillary 207 (H) 65 - 99 mg/dL  Glucose, capillary     Status: None   Collection Time: 02/12/16  6:39 AM  Result Value Ref Range   Glucose-Capillary 82 65 - 99 mg/dL     HEENT: normal Cardio: RRR and no murmur Resp: CTA B/L and unlabored GI: BS positive and NT, ND Extremity:  Pulses positive and 3+ Right foot Edema Skin:   Intact Neuro: Lethargic, Flat, Abnormal Sensory reduced sensation to pinch in RLE, Abnormal Motor 4/5 LUE and LLE, 2- in R Delt Bi Tri 3- Grip, 3- Right HF KE ADF,  Abnormal FMC Ataxic/ dec FMC, Dysarthric and Aphasic Musc/Skel:  Other no tenderness to palpation RIght foot Gen NAD   Assessment/Plan: 1. Functional deficits secondary to Left MCA infarct with aphasia, Right hemiparesis, dysarthria which require 3+ hours per day of interdisciplinary therapy in a comprehensive inpatient rehab setting. Physiatrist is providing close team supervision and 24 hour management of active medical problems listed below. Physiatrist and rehab team continue to assess barriers to discharge/monitor patient progress toward functional and medical goals. FIM:                                  Medical Problem List and Plan: 1.  Left hemiparesis, aphasia, dysphagia dysarthria secondary to left MCA infarct Initiate CIR PT, OT SLP 2.  DVT Prophylaxis/Anticoagulation: Lovenox 40 mg daily 3. Pain Management: Tylenol as needed 4. Mood: Celexa 40 mg daily 5. Neuropsych: This patient is capable of making decisions on he is own behalf. 6. Skin/Wound Care: Routine skin checks 7. Fluids/Electrolytes/Nutrition: Routine I&O with follow-up chemistries             Dysphagia 1  thin, advance as tolerated 8. Acute on chronic combined congestive heart failure. Continue Lasix. Monitor for any signs of fluid overload. Daily weights. 9. Hypertension. Cardura 4 mg daily, Imdur 30 mg daily, Coreg 50 mg twice a day, lisinopril 20 mg daily. Monitor with increased mobility  Vitals:   02/11/16 2206 02/12/16 0518  BP: 134/70 136/77  Pulse: 61 (!) 57  Resp:  16  Temp:  97.4 F (36.3 C)   10. Diabetes mellitus with peripheral neuropathy. Hemoglobin A1c 6.4. Lantus insulin 5 units daily. Check blood sugars before meals and at bedtime. Diabetic teaching CBG (last 3)   Recent Labs  02/11/16 1715 02/11/16 2137 02/12/16 0639  GLUCAP 132* 207* 82    11. Ulcerative colitis. Sulfasalazine 2000 mg twice a day 12. BPH. Flomax 0.4 mg daily at bedtime. Check PVR 3 13.  Hyperlipidemia. Lipitor 14. CRI stage III. Creatinine 1.50-1.58 15. LE edema/Lymphedema: Cont diuretics and compression.  LOS (Days) 1 A FACE TO FACE EVALUATION WAS PERFORMED  KIRSTEINS,ANDREW E 02/12/2016, 7:34 AM

## 2016-02-12 NOTE — Evaluation (Signed)
Physical Therapy Assessment and Plan  Patient Details  Name: [name] MRN: [id number] Date of Birth: 01/09/1961  PT Diagnosis: Abnormal posture, Abnormality of gait, Cognitive deficits, Coordination disorder, Difficulty walking, Edema, Hemiplegia dominant and Muscle weakness Rehab Potential: Good ELOS: 14-16 days   Today's Date: 02/12/2016 PT Individual Time: 3875-6433 and 2951-8841 PT Individual Time Calculation (min): 60 min and 32 min    Problem List: Patient Active Problem List   Diagnosis Date Noted  . Left middle cerebral artery stroke ([place]) 02/11/2016  . Expressive aphasia   . Left hemiparesis ([place])   . Depression   . Dysphagia, post-stroke   . Benign essential HTN   . Type 2 diabetes mellitus with peripheral neuropathy (HCC)   . Ulcerative colitis with complication ([place])   . Benign prostatic hyperplasia   . Hyperlipidemia   . Stage 3 chronic kidney disease   . Peripheral edema   . Cerebral infarction due to occlusion of left middle cerebral artery ([place])   . Hypertension   . Right sided weakness   . Acute on chronic congestive heart failure ([place])   . Stroke (cerebrum) ([place]) 02/05/2016  . Hypokalemia 02/05/2016  . Hypertensive urgency 02/05/2016  . Acute CVA (cerebrovascular accident) ([place]) 02/05/2016  . Anemia of chronic disease 05/21/2015  . Diabetes mellitus, type II, insulin dependent ([place]) 05/21/2015  . OSA ? compliacne 05/21/2015  . Homelessness 05/21/2015  . Weakness   . AKI (acute kidney injury) ([place]) 05/20/2015  . Essential hypertension 05/20/2015  . Acute on chronic combined systolic and diastolic HF (heart failure) ([place]) 05/20/2015  . Weakness of right lower extremity 05/20/2015    Past Medical History:  Past Medical History:  Diagnosis Date  . BPH (benign prostatic hyperplasia)   . CHF (congestive heart failure) ([place])   . Diabetes mellitus without complication ([place])   . Diabetic retinopathy associated with type 2 diabetes mellitus ([place])   .  Hypertension   . Lymphedema   . OSA (obstructive sleep apnea)   . TIA (transient ischemic attack)    Past Surgical History:  Past Surgical History:  Procedure Laterality Date  . BLADDER SURGERY    . CARDIAC CATHETERIZATION    . EP IMPLANTABLE DEVICE N/A 02/08/2016   Procedure: Loop Recorder Insertion;  Surgeon: Deboraha Sprang, MD;  Location: [facility] CV LAB;  Service: Cardiovascular;  Laterality: N/A;  . TEE WITHOUT CARDIOVERSION N/A 02/08/2016   Procedure: TRANSESOPHAGEAL ECHOCARDIOGRAM (TEE);  Surgeon: Thayer Headings, MD;  Location: [facility];  Service: Cardiovascular;  Laterality: N/A;    Assessment & Plan Clinical Impression: [name] a 56 y.o.right handed malewith history of diastolic congestive heart failure,OSA,diabetes mellitus and peripheral neuropathy, hypertension, TIA maintained on aspirin, chronic renal insufficiency creatinine baseline 1.50-1.58. Per chart review patient lives alone. Reported to be independent with assistive device prior to admission. One level apartment. Presented 02/05/2016 with slurred speech, altered mental status and right-sided weakness. He was hypertensive 200/140. EKG showed sinus rhythm with LAFB and LVH. Chest x-ray new cardiomegaly with pulmonary vascular congestion and slight bilateral interstitial edema. Cranial CT scan negative for acute changes. 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 CT cerebral perfusion showed acute small to moderate-sized left MCA territory infarct. Patient did not receive TPA. CT angiogram head and neck showed acute left M3 occlusion as well as scattered atheromatousplaque. MRI completed 02/07/2016 showed moderate left frontal lobe MCA territory infarct as well as old bilateral basal ganglia infarcts and right occipital lobe. Echocardiogram with ejection fraction  of 35% diffuse hypokinesis with grade 2 diastolic dysfunction. Venous Doppler studies negative for DVT. Maintained on aspirin for Plavix/ CVA  prophylaxis 3 months then Plavix alone. Subcutaneous Lovenox for DVT prophylaxis. Dysphagia# 2thin liquid diets.TEE completed 02/08/2016 showing severe LV dysfunction with EF of 30% no thrombi and loop recorder was placed. Physical and occupational therapy evaluations completed with recommendations of physical medicine rehabilitation consult. Patient transferred to CIR on 02/11/2016.   Patient currently requires max with mobility secondary to muscle weakness, muscle joint tightness and muscle paralysis, decreased cardiorespiratoy endurance, impaired timing and sequencing, abnormal tone, unbalanced muscle activation and decreased coordination, decreased attention to right, decreased attention, decreased awareness, decreased problem solving, decreased safety awareness, decreased memory and delayed processing and decreased standing balance, decreased postural control, hemiplegia and decreased balance strategies.  Prior to hospitalization, patient was modified independent  with mobility and lived with Alone in a [place] home.  Home access is  Level entry.  Patient will benefit from skilled PT intervention to maximize safe functional mobility, minimize fall risk and decrease caregiver burden for planned discharge to SNF or ALF as family unable to provide 24/7 care.  Anticipate patient will benefit from further therapies at SNF at discharge.  PT - End of Session Activity Tolerance: Decreased this session;Tolerates 30+ min activity with multiple rests Endurance Deficit: Yes Endurance Deficit Description: fatigued with increased mobility PT Assessment Rehab Potential (ACUTE/IP ONLY): Good Barriers to Discharge: Decreased caregiver support PT Patient demonstrates impairments in the following area(s): Balance;Behavior;Edema;Endurance;Motor;Nutrition;Perception;Safety PT Transfers Functional Problem(s): Bed Mobility;Bed to Chair;Car;Furniture PT Locomotion Functional Problem(s): Ambulation;Wheelchair  Mobility;Stairs PT Plan PT Intensity: Minimum of 1-2 x/day ,45 to 90 minutes PT Frequency: 5 out of 7 days PT Duration Estimated Length of Stay: 14-16 days PT Treatment/Interventions: Ambulation/gait training;Balance/vestibular training;Cognitive remediation/compensation;Community reintegration;Discharge planning;Disease management/prevention;DME/adaptive equipment instruction;Functional electrical stimulation;Functional mobility training;Neuromuscular re-education;Pain management;Patient/family education;Psychosocial support;Splinting/orthotics;Stair training;Therapeutic Activities;Therapeutic Exercise;UE/LE Strength taining/ROM;UE/LE Coordination activities;Wheelchair propulsion/positioning PT Transfers Anticipated Outcome(s): supervision PT Locomotion Anticipated Outcome(s): supervision household PT Recommendation Recommendations for Other Services: Therapeutic Recreation consult Therapeutic Recreation Interventions: Kitchen group Follow Up Recommendations: 24 hour supervision/assistance;Skilled nursing facility Patient destination: [facility] (SNF) (SNF or AL) Equipment Recommended: To be determined  Skilled Therapeutic Intervention Treatment 1: Skilled therapeutic intervention initiated after completion of evaluation. Discussed with patient falls risk, safety within room, and focus of therapy during stay. Patient requires min-mod A sit > stand with cues for R hand and foot placement, max A for stand pivot transfers, mod A stairs using 2 rails, gait with RUE around therapist with mod-max A x 80 ft, and supervision-min A for bed mobility on regular bed in ADL apartment. Retrieved 18 x 18 manual wheelchair and adjusted to fit patient to promote improved sitting tolerance and positioning. Patient left sitting in wheelchair with all needs within reach.   Treatment 2: Patient resting in wheelchair upon arrival, no c/o pain. Sit <> stand with min A overall and gait with mod A overall x  100 ft with RUE around therapist terminated due to patient c/o "feeling strange," vitals WFL. Patient instructed in 5TSS with BUE support from arm chair = 65 sec. Gait training using RW x 200 ft with mod faded to max A to stabilize R hip abductors and facilitate weight shifting to L. Patient left in bed with all needs within reach.   PT Evaluation Precautions/Restrictions Restrictions Weight Bearing Restrictions: No Pain  Pain Assessment Pain Assessment: No/denies pain Pain Score: 0-No pain Home Living/Prior Functioning Home Living Living Arrangements: Alone Available  Help at Discharge: [place] (planned DC to SNF or ALF) Type of Home: Apartment Home Access: Level entry Bathroom Shower/Tub: Tub/shower unit;Curtain Bathroom Toilet: Standard Bathroom Accessibility: Yes Additional Comments: brother reports poor living conditions, hoarding, disawray that family was not aware of  Lives With: Alone Prior Function Level of Independence: Independent with basic ADLs;Independent with transfers;Independent with gait;Requires assistive device for independence  Able to Take Stairs?: No Driving: Yes Vocation: Unemployed Vision/Perception   Defer to OT evaluation  Cognition Overall Cognitive Status: Impaired/Different from baseline Arousal/Alertness: Awake/alert Orientation Level: Oriented X4 Attention: Sustained Sustained Attention: Impaired Sustained Attention Impairment: Verbal basic;Functional basic Memory: Impaired Memory Impairment: Storage deficit;Retrieval deficit Awareness: Impaired Awareness Impairment: Intellectual impairment Problem Solving: Impaired Problem Solving Impairment: Verbal basic;Functional basic Safety/Judgment: Impaired Comments: right inattention  Sensation Sensation Light Touch: Appears Intact Hot/Cold: Appears Intact Proprioception: Impaired Detail Proprioception Impaired Details: Impaired RUE;Impaired RLE Coordination Gross Motor  Movements are Fluid and Coordinated: No Fine Motor Movements are Fluid and Coordinated: No Coordination and Movement Description: R hemiplegia Motor  Motor Motor: Hemiplegia;Abnormal postural alignment and control  Mobility Bed Mobility Bed Mobility: Supine to Sit;Sit to Supine Supine to Sit: 5: Supervision;HOB flat Sit to Supine: 4: Min assist;HOB flat Transfers Transfers: Yes Stand Pivot Transfers: 2: Max assist;With armrests Locomotion  Ambulation Ambulation: Yes Ambulation/Gait Assistance: 2: Max assist;3: Mod assist Ambulation Distance (Feet): 80 Feet Assistive device: 1 person hand held assist (RUE around PT) Gait Gait: Yes Gait Pattern: Impaired Gait Pattern: Step-to pattern;Step-through pattern;Decreased step length - left;Decreased dorsiflexion - right;Decreased weight shift to left;Decreased stance time - right;Decreased stride length;Right genu recurvatum;Lateral trunk lean to right;Decreased trunk rotation;Trunk flexed;Poor foot clearance - right Gait velocity: decreased Stairs / Additional Locomotion Stairs: Yes Stairs Assistance: 3: Mod assist Stair Management Technique: Two rails;Step to pattern;Forwards Number of Stairs: 8 Height of Stairs: 3 Wheelchair Mobility Wheelchair Mobility: No  Trunk/Postural Assessment  Cervical Assessment Cervical Assessment: Exceptions to [facility] (forward head) Thoracic Assessment Thoracic Assessment: Exceptions to [facility] (slight kyphosis) Lumbar Assessment Lumbar Assessment: Exceptions to [facility] (posterior pelvic tilt) Postural Control Postural Control: Deficits on evaluation Protective Responses: impaired  Balance Balance Balance Assessed: Yes Static Standing Balance Static Standing - Balance Support: Right upper extremity supported;During functional activity Static Standing - Level of Assistance: 4: Min assist Dynamic Standing Balance Dynamic Standing - Balance Support: During functional activity;Right upper extremity  supported Dynamic Standing - Level of Assistance: 2: Max assist Extremity Assessment  RLE Assessment RLE Assessment: Exceptions to [facility] RLE Strength RLE Overall Strength: Deficits RLE Overall Strength Comments: 3-/5 hip flexion, knee flexion/extension, ankle DF 0/5  LLE Assessment LLE Assessment: Within Functional Limits   See Function Navigator for Current Functional Status.   Refer to Care Plan for Long Term Goals  Recommendations for other services: Therapeutic Recreation  Kitchen group  Discharge Criteria: Patient will be discharged from PT if patient refuses treatment 3 consecutive times without medical reason, if treatment goals not met, if there is a change in medical status, if patient makes no progress towards goals or if patient is discharged from hospital.  The above assessment, treatment plan, treatment alternatives and goals were discussed and mutually agreed upon: by patient  , Murray Hodgkins 02/12/2016, 4:59 PM

## 2016-02-12 NOTE — Care Management Note (Signed)
[facility] Individual Statement of Services  Patient Name:  [name]  Date:  02/12/2016  Welcome to the [facility]patient [facility].  Our goal is to provide you with an individualized program based on your diagnosis and situation, designed to meet your specific needs.  With this comprehensive rehabilitation program, you will be expected to participate in at least 3 hours of rehabilitation therapies Monday-Friday, with modified therapy programming on the weekends.  Your rehabilitation program will include the following services:  Physical Therapy (PT), Occupational Therapy (OT), Speech Therapy (ST), 24 hour per day rehabilitation nursing, Therapeutic Recreaction (TR), Case Management (Social Worker), Rehabilitation Medicine, Nutrition Services and Pharmacy Services  Weekly team conferences will be held on Wednesday to discuss your progress.  Your Social Worker will talk with you frequently to get your input and to update you on team discussions.  Team conferences with you and your family in attendance may also be held.  Expected length of stay: 14-16 days Overall anticipated outcome: supervision with cueing  Depending on your progress and recovery, your program may change. Your Social Worker will coordinate services and will keep you informed of any changes. Your Social Worker's name and contact numbers are listed  below.  The following services may also be recommended but are not provided by the Inpatient Rehabilitation Center:    Home Health Rehabiltiation Services  Outpatient Rehabilitation Services    Arrangements will be made to provide these services after discharge if needed.  Arrangements include referral to agencies that provide these services.  Your insurance has been verified to be:  Medicare &Fed BCBS Your primary doctor is:  Purd[place]  Pertinent information will be shared with your doctor and your insurance company.  Social Worker:  Dossie Der, SW [phone] or ([phone]  Information discussed with and copy given to patient by: Lucy Chris, 02/12/2016, 11:17 AM

## 2016-02-12 NOTE — Progress Notes (Signed)
Patient information reviewed and entered into eRehab system by Maralyn Witherell, RN, CRRN, PPS Coordinator.  Information including medical coding and functional independence measure will be reviewed and updated through discharge.     Per nursing patient was given "Data Collection Information Summary for Patients in Inpatient Rehabilitation Facilities with attached "Privacy Act Statement-Health Care Records" upon admission.  

## 2016-02-13 ENCOUNTER — Inpatient Hospital Stay (HOSPITAL_COMMUNITY): Payer: Medicare Other | Admitting: Speech Pathology

## 2016-02-13 ENCOUNTER — Inpatient Hospital Stay (HOSPITAL_COMMUNITY): Payer: Medicare Other | Admitting: Occupational Therapy

## 2016-02-13 ENCOUNTER — Inpatient Hospital Stay (HOSPITAL_COMMUNITY): Payer: Medicare Other | Admitting: Physical Therapy

## 2016-02-13 LAB — GLUCOSE, CAPILLARY
GLUCOSE-CAPILLARY: 75 mg/dL (ref 65–99)
GLUCOSE-CAPILLARY: 80 mg/dL (ref 65–99)
Glucose-Capillary: 114 mg/dL — ABNORMAL HIGH (ref 65–99)
Glucose-Capillary: 156 mg/dL — ABNORMAL HIGH (ref 65–99)

## 2016-02-13 NOTE — Progress Notes (Signed)
Speech Language Pathology Daily Session Note  Patient Details  Name: [name] MRN: [id number] Date of Birth: Jun 07, 1960  Today's Date: 02/13/2016 SLP Individual Time: 0803-0900 SLP Individual Time Calculation (min): 57 min   Short Term Goals: Week 1: SLP Short Term Goal 1 (Week 1): Pt will consume dys 1 textures and thin liquids with supervision verbal cues for use of swallowing precautions and minimal overt s/s of aspiration.   SLP Short Term Goal 2 (Week 1): Pt will consume therapeutic trials of dys 2 textures with min verbal cues for use of swallowing precautions over 2 consecutive sessions prior to advancement.   SLP Short Term Goal 3 (Week 1): Pt will visually scan to the right of midline during basic functional tasks in >75% of opportunities wtih supervision verbal cues.   SLP Short Term Goal 4 (Week 1): Pt will recognize and correct verbal errors in the moment during phrase/sentence level expression with min assist verbal cues.  SLP Short Term Goal 5 (Week 1): Pt will utilize increased vocal intensity and overarticulation to achieve intelligibility at the sentence level with min verbal cues.   SLP Short Term Goal 6 (Week 1): Pt will utilize compensatory word finding strategies at the phrase/sentence level with min assist verbal cues.    Skilled Therapeutic Interventions:  Pt was seen for skilled ST targeting goals for dysphagia and communication.  [name] and thin liquids with min assist verbal cues to attend to boluses and clear solids from the oral cavity.  Respirations were intermittently wet which SLP suspects to be related to residue resulting from delay in swallow response.  Min assist verbal cues were needed to monitor and correct anterior labial loss of materials.  Recommend that pt remain on dys 1 textures and thin liquids with full supervision for use of precautions.  SLP also facilitated the session with skilled education regarding speech intelligibility  strategies.  Pt was intelligible at the word level during structured naming tasks with supervision verbal cues but needed up to min assist verbal cues for intelligibility at the phrase level.  Pt also needed min assist verbal cues to recognize and correct perseverative verbal errors during task.  Pt was left in bed with bed alarm set and call bell within reach.  Continue per current plan of care.    Function:  Eating Eating   Modified Consistency Diet: Yes Eating Assist Level: Set up assist for;Supervision or verbal cues;Helper checks for pocketed food   Eating Set Up Assist For: Opening containers       Cognition Comprehension Comprehension assist level: Understands basic 75 - 89% of the time/ requires cueing 10 - 24% of the time  Expression   Expression assist level: Expresses basic 50 - 74% of the time/requires cueing 25 - 49% of the time. Needs to repeat parts of sentences.  Social Interaction Social Interaction assist level: Interacts appropriately 50 - 74% of the time - May be physically or verbally inappropriate.  Problem Solving Problem solving assist level: Solves basic 50 - 74% of the time/requires cueing 25 - 49% of the time  Memory Memory assist level: Recognizes or recalls 50 - 74% of the time/requires cueing 25 - 49% of the time    Pain Pain Assessment Pain Assessment: No/denies pain  Therapy/Group: Individual Therapy  [name], [name] 02/13/2016, 11:12 AM

## 2016-02-13 NOTE — Progress Notes (Addendum)
Recreational Therapy Session Note  Patient Details  Name: [name] MRN: [id number] Date of Birth: 04/01/1960 Today's Date: 02/13/2016  Pt participated in animal assisted activity/therapy seated w/c level with supervision.  Pt noticed pet partner team in the hall and called out asking [place] to come in.  Pt conversing at phrase & sentence level with min questioning cues for inteligeability.  Pt attempted to use RUE to pet the dog but needed Mod assist to sustain it during visit.  Pt very appreciative of the visit and easily engaged in conversation with team. Mikel Hardgrove 02/13/2016, 3:23 PM

## 2016-02-13 NOTE — IPOC Note (Signed)
Overall Plan of Care [facility]) Patient Details Name: [name] MRN: [id number] DOB: 01-Jan-1961  Admitting Diagnosis: CVA  Hospital Problems: Active Problems:   Left middle cerebral artery stroke [facility])     Functional Problem List: Nursing Bladder, Bowel, Motor, Nutrition, Safety, Skin Integrity, Sensory  PT Balance, Behavior, Edema, Endurance, Motor, Nutrition, Perception, Safety  OT Balance, Behavior, Cognition, Edema, Endurance, Motor, Perception, Safety, Vision, Sensory  SLP Cognition, Linguistic, Motor, Nutrition  TR         Basic ADL's: OT Eating, Grooming, Bathing, Dressing, Toileting     Advanced  ADL's: OT       Transfers: PT Bed Mobility, Bed to Chair, Car, Lobbyist, Technical brewer: PT Ambulation, Psychologist, prison and probation services, Stairs     Additional Impairments: OT Fuctional Use of Upper Extremity  SLP Swallowing, Communication, Social Cognition expression Memory, Attention, Awareness, Problem Solving  TR      Anticipated Outcomes Item Anticipated Outcome  Self Feeding Mod I  Swallowing  Supervision    Basic self-care  Supervision  Toileting  Supervision   Bathroom Transfers Supervision  Bowel/Bladder  manage bowel and bladder with min assist  Transfers  supervision  Locomotion  supervision household  Communication  Min assist   Cognition  Min assist   Pain  Pain less than 3  Safety/Judgment  Remain free of falls, infection and skin breakdown with min asssit   Therapy Plan: PT Intensity: Minimum of 1-2 x/day ,45 to 90 minutes PT Frequency: 5 out of 7 days PT Duration Estimated Length of Stay: 14-16 days OT Intensity: Minimum of 1-2 x/day, 45 to 90 minutes OT Frequency: 5 out of 7 days OT Duration/Estimated Length of Stay: 14-16 days SLP Intensity: Minumum of 1-2 x/day, 30 to 90 minutes SLP Frequency: 3 to 5 out of 7 days SLP Duration/Estimated Length of Stay: 14-17 days        Team Interventions: Nursing  Interventions Patient/Family Education, Bladder Management, Bowel Management, Medication Management, Cognitive Remediation/Compensation, Dysphagia/Aspiration Precaution Training, Discharge Planning, Psychosocial Support  PT interventions Ambulation/gait training, Warden/ranger, Cognitive remediation/compensation, Community reintegration, Discharge planning, Disease management/prevention, DME/adaptive equipment instruction, Functional electrical stimulation, Functional mobility training, Neuromuscular re-education, Pain management, Patient/family education, Psychosocial support, Splinting/orthotics, Stair training, Therapeutic Activities, Therapeutic Exercise, UE/LE Strength taining/ROM, UE/LE Coordination activities, Wheelchair propulsion/positioning  OT Interventions Warden/ranger, Cognitive remediation/compensation, Community reintegration, Discharge planning, DME/adaptive equipment instruction, Functional electrical stimulation, Functional mobility training, Neuromuscular re-education, Patient/family education, Self Care/advanced ADL retraining, Therapeutic Activities, Therapeutic Exercise, UE/LE Strength taining/ROM, UE/LE Coordination activities, Visual/perceptual remediation/compensation  SLP Interventions Cognitive remediation/compensation, Cueing hierarchy, Dysphagia/aspiration precaution training, Environmental controls, Internal/external aids, Multimodal communication approach, Patient/family education, Functional tasks, Speech/Language facilitation  TR Interventions    SW/CM Interventions Discharge Planning, Psychosocial Support, Patient/Family Education    Team Discharge Planning: Destination: PT-Skilled Nursing Facility (SNF) (SNF or AL) ,OT- Skilled Nursing Facility (SNF) , SLP-Skilled Nursing Facility (SNF) Projected Follow-up: PT-24 hour supervision/assistance, Skilled nursing facility, OT-  Skilled nursing facility, SLP-Skilled Nursing facility, 24 hour  supervision/assistance Projected Equipment Needs: PT-To be determined, OT-  , SLP-None recommended by SLP Equipment Details: PT- , OT-  Patient/family involved in discharge planning: PT- Patient,  OT-Patient, SLP-Patient  MD ELOS: 18-23d Medical Rehab Prognosis:  Good Assessment:  56 y.o.right handed malewith history of diastolic congestive heart failure,OSA,diabetes mellitus and peripheral neuropathy, hypertension, TIA maintained on aspirin, chronic renal insufficiency creatinine baseline 1.50-1.58. Per chart review patient lives alone. Reported to be independent with assistive device prior to admission.  One level apartment. Presented 02/05/2016 with slurred speech, altered mental status and right-sided weakness. He was hypertensive 200/140. EKG showed sinus rhythm with LAFB and LVH. Chest x-ray new cardiomegaly with pulmonary vascular congestion and slight bilateral interstitial edema. Cranial CT scan negative for acute changes. 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 CT cerebral perfusion showed acute small to moderate-sized left MCA territory infarct. Patient did not receive TPA. CT angiogram head and neck showed acute left M3 occlusion as well as scattered atheromatousplaque. MRI completed 02/07/2016 showed moderate left frontal lobe MCA territory infarct as well as old bilateral basal ganglia infarcts and right occipital lobe. Echocardiogram with ejection fraction of 35% diffuse hypokinesis with grade 2 diastolic dysfunction. Venous Doppler studies negative for DVT. Maintained on aspirin for Plavix/ CVA prophylaxis 3 months then Plavix alone  Now requiring 24/7 Rehab RN,MD, as well as CIR level PT, OT and SLP.  Treatment team will focus on ADLs and mobility with goals set at sup  See Team Conference Notes for weekly updates to the plan of care

## 2016-02-13 NOTE — Patient Care Conference (Signed)
Inpatient RehabilitationTeam Conference and Plan of Care Update Date: 02/13/2016   Time: 10:55 AM    Patient Name: [name]      Medical Record Number: [id number]  Date of Birth: 1960/07/20 Sex: Male         Room/Bed: 4W23C/4W23C-01 Payor Info: Payor: MEDICARE / Plan: MEDICARE PART A AND B / Product Type: *No Product type* /    Admitting Diagnosis: CVA  Admit Date/Time:  02/11/2016  2:58 PM Admission Comments: No comment available   Primary Diagnosis:  <principal problem not specified> Principal Problem: <principal problem not specified>  Patient Active Problem List   Diagnosis Date Noted  . 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 Expressive aphasia   . Left hemiparesis (HCC)   . Depression   . Dysphagia, post-stroke   . Benign essential HTN   . Type 2 diabetes mellitus with peripheral neuropathy (HCC)   . Ulcerative colitis with complication (HCC)   . Benign prostatic hyperplasia   . Hyperlipidemia   . Stage 3 chronic kidney disease   . Peripheral edema   . Cerebral infarction due to occlusion of left middle cerebral artery (HCC)   . Hypertension   . Right sided weakness   . Acute on chronic congestive heart failure (HCC)   . Stroke (cerebrum) (HCC) 02/05/2016  . Hypokalemia 02/05/2016  . Hypertensive urgency 02/05/2016  . Acute CVA (cerebrovascular accident) (HCC) 02/05/2016  . Anemia of chronic disease 05/21/2015  . Diabetes mellitus, type II, insulin dependent (HCC) 05/21/2015  . OSA ? compliacne 05/21/2015  . Homelessness 05/21/2015  . Weakness   . AKI (acute kidney injury) (HCC) 05/20/2015  . Essential hypertension 05/20/2015  . Acute on chronic combined systolic and diastolic HF (heart failure) (HCC) 05/20/2015  . Weakness of right lower extremity 05/20/2015    Expected Discharge Date: Expected Discharge Date: 02/26/16  Team Members Present: Physician leading conference: Dr. Claudette Laws Social Worker Present: Dossie Der, LCSW Nurse  Present: Carmie End, RN PT Present: Wanda Plump, Antonietta Jewel, PT OT Present: Perrin Maltese, OT SLP Present: Jackalyn Lombard, SLP PPS Coordinator present : Tora Duck, RN, CRRN     Current Status/Progress Goal Weekly Team Focus  Medical   Patient has communication deficits, multifactorial with a aphasia, apraxia and dysarthria. Has some difficulty with swallowing. Level alertness fluctuates  Home with safe by mouth intake  Improve safety awareness, reduced impulsivity   Bowel/Bladder   Occasional incontinence of bladder. LBM1/8  Staff implementing a tolieting schedule q 3 hrs and after meals  Staff continue with schedule, and encourage patient to call to make needs known   Swallow/Nutrition/ Hydration   Dys 1, thin liquids; full supervision   supervision   toleration of current diet and trials of advanced consistencies    ADL's   Overall Max/Total A ADL  Supervision overall  NMR, functional use of R UE, functional transfers, attention to R, ADL modifications   Mobility   mod assist overall for mobilty including transfers, gait, bed mobility.   Supervision assist with LRAD   Neuromuscular re-ed for the LUE/LLE. strength, balance, safety, improved independance with gait and transfers.    Communication   mod assist   supervision   speech intelligibility and word finding strategies; awareness of verbal errors    Safety/Cognition/ Behavioral Observations  mod assist   supervision   attention to tasks, awareness of deficits, visual scanning to the right of midline   Pain   no complaints of pain  Continue to assess pain q shift and PRN  Tylenol 325-650 mg q 4 hrs PRN for pain   Skin   No skin issue noted  Continue to assess skin, and use epbc cream daily  Continue to monitor and assess skin q shift      *See Care Plan and progress notes for long and short-term goals.  Barriers to Discharge: Family cannot provide assistance    Possible Resolutions to Barriers:  May need SNF or  ALF placement depending on progress    Discharge Planning/Teaching Needs:  Plan is for pt to move to the [place] area where his brother and sister in-law are. They can not provide care, so plan is either SNF versus ALF depending upon level of care      Team Discussion:  Goals supervision-min assist level. R-inattention and impulsive in his movements. Difficulty clearing his right foot. Dys 1 thin liquid diet. Kinesis tape his R-foot to decrease swelling. More alert today and able to participate more in therapies.  Working on activity tolerance in therapies.  Revisions to Treatment Plan:  Plan to go to NH from here   Continued Need for Acute Rehabilitation Level of Care: The patient requires daily medical management by a physician with specialized training in physical medicine and rehabilitation for the following conditions: Daily direction of a multidisciplinary physical rehabilitation program to ensure safe treatment while eliciting the highest outcome that is of practical value to the patient.: Yes Daily medical management of patient stability for increased activity during participation in an intensive rehabilitation regime.: Yes Daily analysis of laboratory values and/or radiology reports with any subsequent need for medication adjustment of medical intervention for : Neurological problems;Nutritional problems  Lucy Chris 02/14/2016, 9:48 AM

## 2016-02-13 NOTE — Progress Notes (Signed)
Physical Therapy Session Note  Patient Details  Name: [name] MRN: [id number] Date of Birth: January 16, 1961  Today's Date: 02/13/2016 PT Individual Time: 0904-1030 PT Individual Time Calculation (min): 86 min    Short Term Goals: Week 1:  PT Short Term Goal 1 (Week 1): Patient will transfer bed <> wheelchair with min A using LRAD. PT Short Term Goal 2 (Week 1): Patient will ambulate 150 ft using LRAD with min A.  PT Short Term Goal 3 (Week 1): Patient will negotiate up/down 12 stairs using 2 rails with min A for strengthening.  PT Short Term Goal 4 (Week 1): Patient will maintain dynamic standing balance x 2 min with min A.   Skilled Therapeutic Interventions/Progress Updates:    Pt received supine in bed and agreeable to PT. Supine>sit transfer with min-mod assist at the RLE and trunk  and min cues for proper sequencing positioning.   Throughout treatment patient performed stand pivot transfers with overall mod assist. Mod cues for   Gait for 18f, 736f And 5083fith RW  And min-mod assist from PT, as well as mod-max cues for improved hip/knee flexion.  Nustep reciprocal movement pattern training x 10 minutes. R hand splint. Min cues for improved R knee control.   Bed mobility for sit<>supine on mat table with min assist to supine and mod assist to sitting EOB. Min cues for proper R UE placement and sequencing.   Supine therex to improved activation of the RLE completed x 10 R LE and x 12 LLE SAQ Bridges with min assist from PT to stabilize the RLE Heel slides Isometric hip abduction Cues for decreased speed improved hip control.   PT applied Tband to R wheel rim. WC mobility with BUE x 10 ft with max assist. BUE/BLE x 37f35fth mod assist for hand over hand control of the R wheel. BLE and LUE x 40ft64fh min assist from PT.   [name] and all needs met.       Therapy Documentation Precautions:  Precautions Precautions:  Fall Precaution Comments: right sided weakness Restrictions Weight Bearing Restrictions: No   Pain: Pain Assessment Pain Assessment: No/denies pain   See Function Navigator for Current Functional Status.   Therapy/Group: Individual Therapy  AustiLorie Phenix/2018, 12:46 PM

## 2016-02-13 NOTE — Progress Notes (Signed)
Occupational Therapy Session Note  Patient Details  Name: [name] MRN: [id number] Date of Birth: 1960-07-07  Today's Date: 02/13/2016 OT Individual Time: 1105-1200 OT Individual Time Calculation (min): 55 min     Short Term Goals: Week 1:  OT Short Term Goal 1 (Week 1): Pt will don shirt using hemi-techniques with Mod A OT Short Term Goal 2 (Week 1): Pt will complete toilet transfer with Mod A OT Short Term Goal 3 (Week 1): Pt will recall hemi-dressing techniques 2 consecutive days with min questioning cues OT Short Term Goal 4 (Week 1): Pt will use the R UE as a stabilizer during functional activities with Min A  Skilled Therapeutic Interventions/Progress Updates:    Pt completed shower and dressing during session.  Mod assist for transfer into and out of the shower, using the tub bench.  Mod instructional cueing for sequencing bathing tasks with mod assist needed to integrate RUE functional use to wash the LUE.  Mod assist for all sit to stand transitions throughout session, including LB dressing.  He needed mod instructional cueing for hemidressing techniques to sequence and donn a pullover shirt.  Mod assist was also needed for crossing the RLE over the left knee for threading underpants this session.  Pt at times leaving the RUE hanging off of the wheelchair, requiring max instructional cueing to reposition in his lap.  May benefit from half lap tray for support.  Pt left in wheelchair with call button and phone in reach and safety belt in place.   Therapy Documentation Precautions:  Precautions Precautions: Fall Precaution Comments: right sided weakness Restrictions Weight Bearing Restrictions: No   Pain: Pain Assessment Pain Assessment: No/denies pain ADL:  See Function Navigator for Current Functional Status.   Therapy/Group: Individual Therapy  Larosa Rhines OTR/L 02/13/2016, 12:56 PM

## 2016-02-13 NOTE — Progress Notes (Addendum)
Subjective/Complaints: Eating with SLP occ cough Reclined in bed SLP notes dysarthria with word finding errors, some aphasia and apraxia as well  ROS:  Limited due to communication deficits  Objective: Vital Signs: Blood pressure (!) 146/82, pulse (!) 58, temperature 97.8 F (36.6 C), temperature source Oral, resp. rate 16, height 5' 11"  (1.803 m), weight 96 kg (211 lb 10.3 oz), SpO2 99 %. No results found. Results for orders placed or performed during the hospital encounter of 02/11/16 (from the past 72 hour(s))  CBC     Status: Abnormal   Collection Time: 02/11/16  3:57 PM  Result Value Ref Range   WBC 8.8 4.0 - 10.5 K/uL   RBC 4.00 (L) 4.22 - 5.81 MIL/uL   Hemoglobin 11.3 (L) 13.0 - 17.0 g/dL   HCT 35.6 (L) 39.0 - 52.0 %   MCV 89.0 78.0 - 100.0 fL   MCH 28.3 26.0 - 34.0 pg   MCHC 31.7 30.0 - 36.0 g/dL   RDW 15.9 (H) 11.5 - 15.5 %   Platelets 191 150 - 400 K/uL  Creatinine, serum     Status: Abnormal   Collection Time: 02/11/16  3:57 PM  Result Value Ref Range   Creatinine, Ser 1.59 (H) 0.61 - 1.24 mg/dL   GFR calc non Af Amer 48 (L) >60 mL/min   GFR calc Af Amer 55 (L) >60 mL/min    Comment: (NOTE) The eGFR has been calculated using the CKD EPI equation. This calculation has not been validated in all clinical situations. eGFR's persistently <60 mL/min signify possible Chronic Kidney Disease.   Glucose, capillary     Status: Abnormal   Collection Time: 02/11/16  5:15 PM  Result Value Ref Range   Glucose-Capillary 132 (H) 65 - 99 mg/dL  Glucose, capillary     Status: Abnormal   Collection Time: 02/11/16  9:37 PM  Result Value Ref Range   Glucose-Capillary 207 (H) 65 - 99 mg/dL  Glucose, capillary     Status: None   Collection Time: 02/12/16  6:39 AM  Result Value Ref Range   Glucose-Capillary 82 65 - 99 mg/dL  CBC WITH DIFFERENTIAL     Status: Abnormal   Collection Time: 02/12/16  8:07 AM  Result Value Ref Range   WBC 6.3 4.0 - 10.5 K/uL   RBC 3.96 (L) 4.22 -  5.81 MIL/uL   Hemoglobin 11.3 (L) 13.0 - 17.0 g/dL   HCT 35.3 (L) 39.0 - 52.0 %   MCV 89.1 78.0 - 100.0 fL   MCH 28.5 26.0 - 34.0 pg   MCHC 32.0 30.0 - 36.0 g/dL   RDW 15.9 (H) 11.5 - 15.5 %   Platelets 189 150 - 400 K/uL   Neutrophils Relative % 73 %   Neutro Abs 4.6 1.7 - 7.7 K/uL   Lymphocytes Relative 18 %   Lymphs Abs 1.1 0.7 - 4.0 K/uL   Monocytes Relative 7 %   Monocytes Absolute 0.4 0.1 - 1.0 K/uL   Eosinophils Relative 2 %   Eosinophils Absolute 0.2 0.0 - 0.7 K/uL   Basophils Relative 0 %   Basophils Absolute 0.0 0.0 - 0.1 K/uL  Comprehensive metabolic panel     Status: Abnormal   Collection Time: 02/12/16  8:07 AM  Result Value Ref Range   Sodium 145 135 - 145 mmol/L   Potassium 3.9 3.5 - 5.1 mmol/L   Chloride 106 101 - 111 mmol/L   CO2 33 (H) 22 - 32 mmol/L   Glucose,  Bld 74 65 - 99 mg/dL   BUN 18 6 - 20 mg/dL   Creatinine, Ser 1.56 (H) 0.61 - 1.24 mg/dL   Calcium 8.8 (L) 8.9 - 10.3 mg/dL   Total Protein 5.1 (L) 6.5 - 8.1 g/dL   Albumin 2.2 (L) 3.5 - 5.0 g/dL   AST 25 15 - 41 U/L   ALT 15 (L) 17 - 63 U/L   Alkaline Phosphatase 161 (H) 38 - 126 U/L   Total Bilirubin 0.5 0.3 - 1.2 mg/dL   GFR calc non Af Amer 49 (L) >60 mL/min   GFR calc Af Amer 56 (L) >60 mL/min    Comment: (NOTE) The eGFR has been calculated using the CKD EPI equation. This calculation has not been validated in all clinical situations. eGFR's persistently <60 mL/min signify possible Chronic Kidney Disease.    Anion gap 6 5 - 15  Glucose, capillary     Status: Abnormal   Collection Time: 02/12/16 12:17 PM  Result Value Ref Range   Glucose-Capillary 149 (H) 65 - 99 mg/dL  Glucose, capillary     Status: Abnormal   Collection Time: 02/12/16  4:49 PM  Result Value Ref Range   Glucose-Capillary 105 (H) 65 - 99 mg/dL  Glucose, capillary     Status: Abnormal   Collection Time: 02/12/16  9:24 PM  Result Value Ref Range   Glucose-Capillary 163 (H) 65 - 99 mg/dL  Glucose, capillary     Status:  None   Collection Time: 02/13/16  6:25 AM  Result Value Ref Range   Glucose-Capillary 75 65 - 99 mg/dL     HEENT: normal Cardio: RRR and no murmur Resp: CTA B/L and unlabored GI: BS positive and NT, ND Extremity:  Pulses positive and 3+ Right foot Edema Skin:   Intact Neuro: Lethargic, Flat, Abnormal Sensory reduced sensation to pinch in RLE, Abnormal Motor 4/5 LUE and LLE, 2- in R Delt Bi Tri 3- Grip, 3- Right HF KE ADF, Abnormal FMC Ataxic/ dec FMC, Dysarthric and Aphasic Musc/Skel:  Other no tenderness to palpation RIght foot Gen NAD Names watch, calls scissors "forceps"  Assessment/Plan: 1. Functional deficits secondary to Left MCA infarct with aphasia, Right hemiparesis, dysarthria which require 3+ hours per day of interdisciplinary therapy in a comprehensive inpatient rehab setting. Physiatrist is providing close team supervision and 24 hour management of active medical problems listed below. Physiatrist and rehab team continue to assess barriers to discharge/monitor patient progress toward functional and medical goals. FIM: Function - Bathing Position: Shower Body parts bathed by patient: Right arm, Chest, Abdomen, Right upper leg, Left upper leg, Front perineal area Body parts bathed by helper: Left lower leg, Right lower leg, Buttocks, Left arm, Back Assist Level:  (Max A)  Function- Upper Body Dressing/Undressing What is the patient wearing?: Pull over shirt/dress Pull over shirt/dress - Perfomed by patient: Thread/unthread left sleeve Pull over shirt/dress - Perfomed by helper: Put head through opening, Thread/unthread right sleeve, Pull shirt over trunk Assist Level: Touching or steadying assistance(Pt > 75%) Function - Lower Body Dressing/Undressing What is the patient wearing?: Underwear, Pants, Non-skid slipper socks, Ted Hose Position: Wheelchair/chair at [employer] - Performed by helper: Thread/unthread right underwear leg, Thread/unthread left underwear leg,  Pull underwear up/down Pants- Performed by helper: Thread/unthread right pants leg, Thread/unthread left pants leg, Pull pants up/down Non-skid slipper socks- Performed by helper: Don/doff left sock, Don/doff right sock TED Hose - Performed by helper: Don/doff left TED hose, Don/doff right TED hose Assist  for footwear: Dependant Assist for lower body dressing:  (Total A)  Function - Toileting Toileting activity did not occur: No continent bowel/bladder event  Function - Air cabin crew transfer activity did not occur: Refused  Function - Chair/bed transfer Chair/bed transfer assist level: Maximal assist (Pt 25 - 49%/lift and lower) Chair/bed transfer assistive device: Armrests  Function - Locomotion: Wheelchair Will patient use wheelchair at discharge?: No Type: Manual Assist Level: Dependent (Pt equals 0%) Function - Locomotion: Ambulation Assistive device: Other (comment) (RUE around therapist) Max distance: 80 ft Assist level: Maximal assist (Pt 25 - 49%) Assist level: Maximal assist (Pt 25 - 49%) Assist level: Maximal assist (Pt 25 - 49%) Walk 10 feet on uneven surfaces activity did not occur: Safety/medical concerns  Function - Comprehension Comprehension: Auditory Comprehension assist level: Understands basic 75 - 89% of the time/ requires cueing 10 - 24% of the time  Function - Expression Expression: Verbal Expression assist level: Expresses basic 50 - 74% of the time/requires cueing 25 - 49% of the time. Needs to repeat parts of sentences.  Function - Social Interaction Social Interaction assist level: Interacts appropriately 75 - 89% of the time - Needs redirection for appropriate language or to initiate interaction.  Function - Problem Solving Problem solving assist level: Solves basic 50 - 74% of the time/requires cueing 25 - 49% of the time  Function - Memory Memory assist level: Recognizes or recalls 50 - 74% of the time/requires cueing 25 - 49% of the  time Patient normally able to recall (first 3 days only): Current season, That he or she is in a hospital  Medical Problem List and Plan: 1.  Left hemiparesis, aphasia, dysphagia dysarthria secondary to left MCA infarct Cont CIR PT, OT SLP Team conference today please see physician documentation under team conference tab, met with team face-to-face to discuss problems,progress, and goals. Formulized individual treatment plan based on medical history, underlying problem and comorbidities. 2.  DVT Prophylaxis/Anticoagulation: Lovenox 40 mg daily 3. Pain Management: Tylenol as needed 4. Mood: Celexa 40 mg daily 5. Neuropsych: This patient is capable of making decisions on he is own behalf. 6. Skin/Wound Care: Routine skin checks 7. Fluids/Electrolytes/Nutrition: Routine I&O with follow-up chemistries             Dysphagia 1 thin, advance as tolerated 8. Acute on chronic combined congestive heart failure. Continue Lasix. Monitor for any signs of fluid overload. Daily weights. 9. Hypertension. Cardura 4 mg daily, Imdur 30 mg daily, Coreg 50 mg twice a day, lisinopril 20 mg daily. Monitor with increased mobility  Vitals:   02/12/16 1532 02/13/16 0532  BP: (!) 146/88 (!) 146/82  Pulse: 60 (!) 58  Resp: 17 16  Temp: 97.7 F (36.5 C) 97.8 F (36.6 C)   10. Diabetes mellitus with peripheral neuropathy. Hemoglobin A1c 6.4. Lantus insulin 5 units daily. Check blood sugars before meals and at bedtime. Diabetic teaching CBG a little low this am d/c lantus CBG (last 3)   Recent Labs  02/12/16 1649 02/12/16 2124 02/13/16 0625  GLUCAP 105* 163* 75    11. Ulcerative colitis. Sulfasalazine 2000 mg twice a day 12. BPH. Flomax 0.4 mg daily at bedtime. Check PVR 3 13. Hyperlipidemia. Lipitor 14. CRI stage III. Creatinine 1.50-1.58, stable, avoid NSAIDs and Metformin 15. LE edema/Lymphedema: Cont diuretics and compression.  LOS (Days) 2 A FACE TO FACE EVALUATION WAS  PERFORMED  Ac Colan E 02/13/2016, 8:04 AM

## 2016-02-13 NOTE — Progress Notes (Signed)
Social Work Patient ID: [name], male   DOB: Sep 02, 1960, 56 y.o.   MRN: [id number]  Spoke with Velma-sister in-law to update regarding team conference goals supervision-min assist level and target Discharge or transfer date is 1/23. Will need form family the places they would like for this worker to look into for an available bed. Discussed with her he would be considered SNF and would need to go to A NH then transition to ALF once at supervision level and able to get himself back and forth to the bathroom. She will discuss with her husband and sister in-law, so they can begin to work on a plan of where. Brother and sister in-law want pt closer to them in the [place] area. Will work on the discharge plan. Brother to contact this worker with questions. Met with pt also regarding the above plans.

## 2016-02-14 ENCOUNTER — Inpatient Hospital Stay (HOSPITAL_COMMUNITY): Payer: Medicare Other | Admitting: Physical Therapy

## 2016-02-14 ENCOUNTER — Inpatient Hospital Stay (HOSPITAL_COMMUNITY): Payer: Medicare Other | Admitting: Occupational Therapy

## 2016-02-14 ENCOUNTER — Inpatient Hospital Stay (HOSPITAL_COMMUNITY): Payer: Medicare Other | Admitting: Speech Pathology

## 2016-02-14 LAB — GLUCOSE, CAPILLARY
GLUCOSE-CAPILLARY: 146 mg/dL — AB (ref 65–99)
GLUCOSE-CAPILLARY: 106 mg/dL — AB (ref 65–99)
GLUCOSE-CAPILLARY: 138 mg/dL — AB (ref 65–99)
Glucose-Capillary: 99 mg/dL (ref 65–99)

## 2016-02-14 MED ORDER — PRO-STAT SUGAR FREE PO LIQD
30.0000 mL | Freq: Two times a day (BID) | ORAL | Status: DC
  Administered 2016-02-14 – 2016-02-27 (×13): 30 mL via ORAL
  Filled 2016-02-14 (×23): qty 30

## 2016-02-14 NOTE — Progress Notes (Signed)
Subjective/Complaints: No issues overnite, states that prior stroke left him with Right sided weakness  ROS:  Limited due to communication deficits  Objective: Vital Signs: Blood pressure (!) 158/82, pulse 62, temperature 98.9 F (37.2 C), temperature source Oral, resp. rate 18, height 5' 11"  (1.803 m), weight 96.8 kg (213 lb 6.5 oz), SpO2 98 %. No results found. Results for orders placed or performed during the hospital encounter of 02/11/16 (from the past 72 hour(s))  CBC     Status: Abnormal   Collection Time: 02/11/16  3:57 PM  Result Value Ref Range   WBC 8.8 4.0 - 10.5 K/uL   RBC 4.00 (L) 4.22 - 5.81 MIL/uL   Hemoglobin 11.3 (L) 13.0 - 17.0 g/dL   HCT 35.6 (L) 39.0 - 52.0 %   MCV 89.0 78.0 - 100.0 fL   MCH 28.3 26.0 - 34.0 pg   MCHC 31.7 30.0 - 36.0 g/dL   RDW 15.9 (H) 11.5 - 15.5 %   Platelets 191 150 - 400 K/uL  Creatinine, serum     Status: Abnormal   Collection Time: 02/11/16  3:57 PM  Result Value Ref Range   Creatinine, Ser 1.59 (H) 0.61 - 1.24 mg/dL   GFR calc non Af Amer 48 (L) >60 mL/min   GFR calc Af Amer 55 (L) >60 mL/min    Comment: (NOTE) The eGFR has been calculated using the CKD EPI equation. This calculation has not been validated in all clinical situations. eGFR's persistently <60 mL/min signify possible Chronic Kidney Disease.   Glucose, capillary     Status: Abnormal   Collection Time: 02/11/16  5:15 PM  Result Value Ref Range   Glucose-Capillary 132 (H) 65 - 99 mg/dL  Glucose, capillary     Status: Abnormal   Collection Time: 02/11/16  9:37 PM  Result Value Ref Range   Glucose-Capillary 207 (H) 65 - 99 mg/dL  Glucose, capillary     Status: None   Collection Time: 02/12/16  6:39 AM  Result Value Ref Range   Glucose-Capillary 82 65 - 99 mg/dL  CBC WITH DIFFERENTIAL     Status: Abnormal   Collection Time: 02/12/16  8:07 AM  Result Value Ref Range   WBC 6.3 4.0 - 10.5 K/uL   RBC 3.96 (L) 4.22 - 5.81 MIL/uL   Hemoglobin 11.3 (L) 13.0 - 17.0  g/dL   HCT 35.3 (L) 39.0 - 52.0 %   MCV 89.1 78.0 - 100.0 fL   MCH 28.5 26.0 - 34.0 pg   MCHC 32.0 30.0 - 36.0 g/dL   RDW 15.9 (H) 11.5 - 15.5 %   Platelets 189 150 - 400 K/uL   Neutrophils Relative % 73 %   Neutro Abs 4.6 1.7 - 7.7 K/uL   Lymphocytes Relative 18 %   Lymphs Abs 1.1 0.7 - 4.0 K/uL   Monocytes Relative 7 %   Monocytes Absolute 0.4 0.1 - 1.0 K/uL   Eosinophils Relative 2 %   Eosinophils Absolute 0.2 0.0 - 0.7 K/uL   Basophils Relative 0 %   Basophils Absolute 0.0 0.0 - 0.1 K/uL  Comprehensive metabolic panel     Status: Abnormal   Collection Time: 02/12/16  8:07 AM  Result Value Ref Range   Sodium 145 135 - 145 mmol/L   Potassium 3.9 3.5 - 5.1 mmol/L   Chloride 106 101 - 111 mmol/L   CO2 33 (H) 22 - 32 mmol/L   Glucose, Bld 74 65 - 99 mg/dL   BUN  18 6 - 20 mg/dL   Creatinine, Ser 1.56 (H) 0.61 - 1.24 mg/dL   Calcium 8.8 (L) 8.9 - 10.3 mg/dL   Total Protein 5.1 (L) 6.5 - 8.1 g/dL   Albumin 2.2 (L) 3.5 - 5.0 g/dL   AST 25 15 - 41 U/L   ALT 15 (L) 17 - 63 U/L   Alkaline Phosphatase 161 (H) 38 - 126 U/L   Total Bilirubin 0.5 0.3 - 1.2 mg/dL   GFR calc non Af Amer 49 (L) >60 mL/min   GFR calc Af Amer 56 (L) >60 mL/min    Comment: (NOTE) The eGFR has been calculated using the CKD EPI equation. This calculation has not been validated in all clinical situations. eGFR's persistently <60 mL/min signify possible Chronic Kidney Disease.    Anion gap 6 5 - 15  Glucose, capillary     Status: Abnormal   Collection Time: 02/12/16 12:17 PM  Result Value Ref Range   Glucose-Capillary 149 (H) 65 - 99 mg/dL  Glucose, capillary     Status: Abnormal   Collection Time: 02/12/16  4:49 PM  Result Value Ref Range   Glucose-Capillary 105 (H) 65 - 99 mg/dL  Glucose, capillary     Status: Abnormal   Collection Time: 02/12/16  9:24 PM  Result Value Ref Range   Glucose-Capillary 163 (H) 65 - 99 mg/dL  Glucose, capillary     Status: None   Collection Time: 02/13/16  6:25 AM   Result Value Ref Range   Glucose-Capillary 75 65 - 99 mg/dL  Glucose, capillary     Status: None   Collection Time: 02/13/16 11:51 AM  Result Value Ref Range   Glucose-Capillary 80 65 - 99 mg/dL  Glucose, capillary     Status: Abnormal   Collection Time: 02/13/16  4:46 PM  Result Value Ref Range   Glucose-Capillary 114 (H) 65 - 99 mg/dL  Glucose, capillary     Status: Abnormal   Collection Time: 02/13/16  9:26 PM  Result Value Ref Range   Glucose-Capillary 156 (H) 65 - 99 mg/dL  Glucose, capillary     Status: None   Collection Time: 02/14/16  6:29 AM  Result Value Ref Range   Glucose-Capillary 99 65 - 99 mg/dL     HEENT: normal Cardio: RRR and no murmur Resp: CTA B/L and unlabored GI: BS positive and NT, ND Extremity:  Pulses positive and 3+ Right foot Edema Skin:   Intact Neuro: Lethargic, Flat, Abnormal Sensory reduced sensation to pinch in RLE, Abnormal Motor 4/5 LUE and LLE, 2- in R Delt Bi Tri 3- Grip, 3- Right HF KE ADF, Abnormal FMC Ataxic/ dec FMC, Dysarthric and Aphasic Musc/Skel:  Other no tenderness to palpation RIght foot Gen NAD Names watch, calls scissors "forceps"  Assessment/Plan: 1. Functional deficits secondary to Left MCA infarct with aphasia, Right hemiparesis, dysarthria which require 3+ hours per day of interdisciplinary therapy in a comprehensive inpatient rehab setting. Physiatrist is providing close team supervision and 24 hour management of active medical problems listed below. Physiatrist and rehab team continue to assess barriers to discharge/monitor patient progress toward functional and medical goals. FIM: Function - Bathing Position: Shower Body parts bathed by patient: Right arm, Chest, Abdomen, Front perineal area, Right upper leg, Left upper leg, Right lower leg, Left lower leg Body parts bathed by helper: Left arm, Buttocks Assist Level:  (Max A)  Function- Upper Body Dressing/Undressing What is the patient wearing?: Pull over  shirt/dress Pull over shirt/dress -  Perfomed by patient: Put head through opening Pull over shirt/dress - Perfomed by helper: Pull shirt over trunk, Thread/unthread left sleeve, Thread/unthread right sleeve Assist Level: Touching or steadying assistance(Pt > 75%) Function - Lower Body Dressing/Undressing What is the patient wearing?: Underwear, Pants, Non-skid slipper socks, Ted Hose Position: Wheelchair/chair at [employer] - Performed by patient: Thread/unthread right underwear leg Underwear - Performed by helper: Thread/unthread left underwear leg, Pull underwear up/down Pants- Performed by patient: Thread/unthread left pants leg Pants- Performed by helper: Thread/unthread right pants leg, Pull pants up/down Non-skid slipper socks- Performed by helper: Don/doff left sock, Don/doff right sock TED Hose - Performed by helper: Don/doff left TED hose, Don/doff right TED hose Assist for footwear: Dependant Assist for lower body dressing:  (Total A)  Function - Toileting Toileting activity did not occur: No continent bowel/bladder event  Function - Air cabin crew transfer activity did not occur: Refused  Function - Chair/bed transfer Chair/bed transfer method: Stand pivot Chair/bed transfer assist level: Moderate assist (Pt 50 - 74%/lift or lower) Chair/bed transfer assistive device: Armrests  Function - Locomotion: Wheelchair Will patient use wheelchair at discharge?: No Type: Manual Max wheelchair distance: 32f  Assist Level: Moderate assistance (Pt 50 - 74%) Assist Level: Moderate assistance (Pt 50 - 74%) Function - Locomotion: Ambulation Assistive device: Walker-rolling Max distance: 135 Assist level: Moderate assist (Pt 50 - 74%) Assist level: Touching or steadying assistance (Pt > 75%) Assist level: Moderate assist (Pt 50 - 74%) Walk 10 feet on uneven surfaces activity did not occur: Safety/medical concerns  Function - Comprehension Comprehension:  Auditory Comprehension assist level: Understands basic 75 - 89% of the time/ requires cueing 10 - 24% of the time  Function - Expression Expression: Verbal Expression assist level: Expresses basic 50 - 74% of the time/requires cueing 25 - 49% of the time. Needs to repeat parts of sentences.  Function - Social Interaction Social Interaction assist level: Interacts appropriately 50 - 74% of the time - May be physically or verbally inappropriate.  Function - Problem Solving Problem solving assist level: Solves basic 50 - 74% of the time/requires cueing 25 - 49% of the time  Function - Memory Memory assist level: Recognizes or recalls 50 - 74% of the time/requires cueing 25 - 49% of the time Patient normally able to recall (first 3 days only): Current season, That he or she is in a hospital  Medical Problem List and Plan: 1.  Left hemiparesis, aphasia, dysphagia dysarthria secondary to left MCA infarct Cont CIR PT, OT SLP Pt level of alertness is improving 2.  DVT Prophylaxis/Anticoagulation: Lovenox 40 mg daily 3. Pain Management: Tylenol as needed 4. Mood: Celexa 40 mg daily 5. Neuropsych: This patient is capable of making decisions on he is own behalf. 6. Skin/Wound Care: Routine skin checks 7. Fluids/Electrolytes/Nutrition: Routine I&O with follow-up chemistries             Dysphagia 1 thin, advance as tolerated 8. Acute on chronic combined congestive heart failure. Continue Lasix. Monitor for any signs of fluid overload. Daily weights. 9. Hypertension. Cardura 4 mg daily, Imdur 30 mg daily, Coreg 50 mg twice a day, lisinopril 20 mg daily. Monitor with increased mobility  Vitals:   02/13/16 1300 02/14/16 0533  BP: 139/87 (!) 158/82  Pulse: 60 62  Resp: 16 18  Temp: 97.6 F (36.4 C) 98.9 F (37.2 C)   10. Diabetes mellitus with peripheral neuropathy. Hemoglobin A1c 6.4. Lantus insulin 5 units daily. Check blood sugars before meals  and at bedtime. Diabetic teaching CBG a little  low this am d/c lantus CBG (last 3)   Recent Labs  02/13/16 1646 02/13/16 2126 02/14/16 0629  GLUCAP 114* 156* 99    11. Ulcerative colitis. Sulfasalazine 2000 mg twice a day 12. BPH. Flomax 0.4 mg daily at bedtime. Check PVR 3 13. Hyperlipidemia. Lipitor 14. CRI stage III. Creatinine 1.50-1.58, stable, avoid NSAIDs and Metformin, proteinuria noted 15. LE edema/Lymphedema: Cont diuretics and compression.Hypoalbuminemia is contributing will add prostat  LOS (Days) 3 A FACE TO FACE EVALUATION WAS PERFORMED  KIRSTEINS,ANDREW E 02/14/2016, 7:38 AM

## 2016-02-14 NOTE — Progress Notes (Signed)
Occupational Therapy Session Note  Patient Details  Name: [name] MRN: [id number] Date of Birth: 10-22-1960  Today's Date: 02/14/2016 OT Individual Time: 8916-9450 OT Individual Time Calculation (min): 55 min     Short Term Goals: Week 1:  OT Short Term Goal 1 (Week 1): Pt will don shirt using hemi-techniques with Mod A OT Short Term Goal 2 (Week 1): Pt will complete toilet transfer with Mod A OT Short Term Goal 3 (Week 1): Pt will recall hemi-dressing techniques 2 consecutive days with min questioning cues OT Short Term Goal 4 (Week 1): Pt will use the R UE as a stabilizer during functional activities with Min A  Skilled Therapeutic Interventions/Progress Updates:    Pt worked on toileting to start therapy session.  He was already positioned on toilet at start of session.  Mod assist needed for clothing management in standing with pt completing peri hygiene in sitting and with standing.  Mod assist for stand pivot transfer back to the wheelchair from the toilet.  He was able to next work on washing his hands and brushing his teeth from wheelchair level.  Mod assist needed to use the RUE for opening toothpaste, and holding cup while drinking to rinse out his mouth.  Once this was completed, therapist educated pt on self AAROM exercises for the right shoulder, elbow, and hand.  He was able to complete 1 set of 10 repetitions for flexion at the elbow and shoulder as well as digit flexion/extension for the hand.  Pt also educated on use of washcloth under his hand on the half lap tray to work on right arm AROM as well.  Call button in reach on the right side with safety belt in place at end of session.   Therapy Documentation Precautions:  Precautions Precautions: Fall Precaution Comments: right sided weakness Restrictions Weight Bearing Restrictions: No  Pain: Pain Assessment Pain Assessment: No/denies pain ADL:  See Function Navigator for Current Functional  Status.   Therapy/Group: Individual Therapy  Evadne Ose OTR/L 02/14/2016, 12:52 PM

## 2016-02-14 NOTE — Progress Notes (Signed)
Physical Therapy Session Note  Patient Details  Name: [name] MRN: [id number] Date of Birth: April 05, 1960  Today's Date: 02/14/2016 PT Individual Time: 0800-0900 AND 1505-1530 PT Individual Time Calculation (min): 60 min AND 25 min    Short Term Goals:Week 1:  PT Short Term Goal 1 (Week 1): Patient will transfer bed <> wheelchair with min A using LRAD. PT Short Term Goal 2 (Week 1): Patient will ambulate 150 ft using LRAD with min A.  PT Short Term Goal 3 (Week 1): Patient will negotiate up/down 12 stairs using 2 rails with min A for strengthening.  PT Short Term Goal 4 (Week 1): Patient will maintain dynamic standing balance x 2 min with min A.   Skilled Therapeutic Interventions/Progress Updates:   Pt received supine in bed and agreeable to PT. Supine>sit transfer with supervision assist, moderate use of bed rails.and min cues for RLE management.   Sitting balance to don ted hose and grip socks as well as don shirt with mod assist at the R UE. Cues for sequencing to don R UE first.   Sit<>stand to don pants with mod assist at bed and BUE support. PT required to pull pants to waist  WC mobility with LUE and BLE x 16f with min-supervision assist and mod cues for technique and improve use of BLE.   Standing balance on 3 inch wedge to strech heel cord and test ankle strategy. Lateral and cross bodyt reach to pick up and then stack cups from elevating table.   Gait training x 923fwith min-mod assist from PT and RW. Mod cues for improved heel contact to allow increased foot clearance.   Patient returned too room and left sitting in [facility] call bell in reach and all needs met.     Session 2  Pt received supine in bed and agreeable to PT. Supine<>sit transfer with supervision assist and min cues for proper use of BUE.   Sit<>stand with min assist x 6 Throughout treatment min cues for proper LE placement and improved anterior weight shift.    Gait through hall x 10014fith RW  and min assist. Gait up/down ramp with RW and min assist. Gait over uneven surface with mod assist from PT to manage.   Patient returned too room and left in bed with call bell in reach and all needs met.             Therapy Documentation Precautions:  Precautions Precautions: Fall Precaution Comments: right sided weakness Restrictions Weight Bearing Restrictions: No General:   Vital Signs: Therapy Vitals Temp: 98.9 F (37.2 C) Temp Source: Oral Pulse Rate: 62 Resp: 18 BP: (!) 158/82 Patient Position (if appropriate): Lying Oxygen Therapy SpO2: 98 % O2 Device: CPAP Pain:   0/10   See Function Navigator for Current Functional Status.   Therapy/Group: Individual Therapy  AusLorie Phenix11/2018, 7:49 AM

## 2016-02-14 NOTE — Progress Notes (Signed)
Patient placed on CPAP for the night without complication. RT will continue to monitor as needed. 

## 2016-02-14 NOTE — Progress Notes (Signed)
Speech Language Pathology Daily Session Note  Patient Details  Name: [name] MRN: [id number] Date of Birth: Feb 25, 1960  Today's Date: 02/14/2016 SLP Individual Time: 1305-1400 SLP Individual Time Calculation (min): 55 min   Short Term Goals: Week 1: SLP Short Term Goal 1 (Week 1): Pt will consume dys 1 textures and thin liquids with supervision verbal cues for use of swallowing precautions and minimal overt s/s of aspiration.   SLP Short Term Goal 2 (Week 1): Pt will consume therapeutic trials of dys 2 textures with min verbal cues for use of swallowing precautions over 2 consecutive sessions prior to advancement.   SLP Short Term Goal 3 (Week 1): Pt will visually scan to the right of midline during basic functional tasks in >75% of opportunities wtih supervision verbal cues.   SLP Short Term Goal 4 (Week 1): Pt will recognize and correct verbal errors in the moment during phrase/sentence level expression with min assist verbal cues.  SLP Short Term Goal 5 (Week 1): Pt will utilize increased vocal intensity and overarticulation to achieve intelligibility at the sentence level with min verbal cues.   SLP Short Term Goal 6 (Week 1): Pt will utilize compensatory word finding strategies at the phrase/sentence level with min assist verbal cues.    Skilled Therapeutic Interventions:  Pt was seen for skilled ST targeting goals for communication and dysphagia.  Pt needed min-mod assist verbal cues to attend to boluses to prevent oral holding when consuming dys 1 textures during lunch meal.  Min verbal cues were needed to monitor and correct anterior labial loss of purees.  No overt s/s of aspiration were evident with purees or liquids during meals.  Oral care was completed after meal to clear minimal diffuse residuals from oral cavity.   Pt's level of assist needed for  intelligibility and word finding fluctuated within therapy session between min-max depending on attention to task and distraction to  the environment.  Pt was returned to room and left in wheelchair with call bell within reach and quick release belt donned for safety.  Continue per current plan of care.    Function:  Eating Eating   Modified Consistency Diet: Yes Eating Assist Level: Set up assist for;Supervision or verbal cues;Helper checks for pocketed food   Eating Set Up Assist For: Opening containers       Cognition Comprehension Comprehension assist level: Understands basic 90% of the time/cues < 10% of the time  Expression   Expression assist level: Expresses basic 50 - 74% of the time/requires cueing 25 - 49% of the time. Needs to repeat parts of sentences.  Social Interaction Social Interaction assist level: Interacts appropriately 50 - 74% of the time - May be physically or verbally inappropriate.  Problem Solving Problem solving assist level: Solves basic 50 - 74% of the time/requires cueing 25 - 49% of the time  Memory Memory assist level: Recognizes or recalls 50 - 74% of the time/requires cueing 25 - 49% of the time    Pain Pain Assessment Pain Assessment: No/denies pain  Therapy/Group: Individual Therapy  [name], Melanee Spry 02/14/2016, 3:35 PM

## 2016-02-14 NOTE — Progress Notes (Signed)
Social Work Lucy Chris, LCSW Social Worker Signed   Patient Care Conference Date of Service: 02/13/2016  2:52 PM      Hide copied text Hover for attribution information Inpatient RehabilitationTeam Conference and Plan of Care Update Date: 02/13/2016   Time: 10:55 AM      Patient Name: [name]      Medical Record Number: [id number]  Date of Birth: 1960-03-13 Sex: Male         Room/Bed: 4W23C/4W23C-01 Payor Info: Payor: MEDICARE / Plan: MEDICARE PART A AND B / Product Type: *No Product type* /     Admitting Diagnosis: CVA  Admit Date/Time:  02/11/2016  2:58 PM Admission Comments: No comment available    Primary Diagnosis:  <principal problem not specified> Principal Problem: <principal problem not specified>       Patient Active Problem List    Diagnosis Date Noted  . 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 Expressive aphasia    . Left hemiparesis (HCC)    . Depression    . Dysphagia, post-stroke    . Benign essential HTN    . Type 2 diabetes mellitus with peripheral neuropathy (HCC)    . Ulcerative colitis with complication (HCC)    . Benign prostatic hyperplasia    . Hyperlipidemia    . Stage 3 chronic kidney disease    . Peripheral edema    . Cerebral infarction due to occlusion of left middle cerebral artery (HCC)    . Hypertension    . Right sided weakness    . Acute on chronic congestive heart failure (HCC)    . Stroke (cerebrum) (HCC) 02/05/2016  . Hypokalemia 02/05/2016  . Hypertensive urgency 02/05/2016  . Acute CVA (cerebrovascular accident) (HCC) 02/05/2016  . Anemia of chronic disease 05/21/2015  . Diabetes mellitus, type II, insulin dependent (HCC) 05/21/2015  . OSA ? compliacne 05/21/2015  . Homelessness 05/21/2015  . Weakness    . AKI (acute kidney injury) (HCC) 05/20/2015  . Essential hypertension 05/20/2015  . Acute on chronic combined systolic and diastolic HF (heart failure) (HCC) 05/20/2015  . Weakness of right lower  extremity 05/20/2015      Expected Discharge Date: Expected Discharge Date: 02/26/16   Team Members Present: Physician leading conference: Dr. Claudette Laws Social Worker Present: Dossie Der, LCSW Nurse Present: Carmie End, RN PT Present: Wanda Plump, Antonietta Jewel, PT OT Present: Perrin Maltese, OT SLP Present: Jackalyn Lombard, SLP PPS Coordinator present : Tora Duck, RN, CRRN       Current Status/Progress Goal Weekly Team Focus  Medical   Patient has communication deficits, multifactorial with a aphasia, apraxia and dysarthria. Has some difficulty with swallowing. Level alertness fluctuates  Home with safe by mouth intake  Improve safety awareness, reduced impulsivity   Bowel/Bladder   Occasional incontinence of bladder. LBM1/8  Staff implementing a tolieting schedule q 3 hrs and after meals  Staff continue with schedule, and encourage patient to call to make needs known   Swallow/Nutrition/ Hydration   Dys 1, thin liquids; full supervision   supervision   toleration of current diet and trials of advanced consistencies    ADL's   Overall Max/Total A ADL  Supervision overall  NMR, functional use of R UE, functional transfers, attention to R, ADL modifications   Mobility   mod assist overall for mobilty including transfers, gait, bed mobility.   Supervision assist with LRAD   Neuromuscular re-ed for the LUE/LLE. strength, balance, safety, improved independance with  gait and transfers.    Communication   mod assist   supervision   speech intelligibility and word finding strategies; awareness of verbal errors    Safety/Cognition/ Behavioral Observations mod assist   supervision   attention to tasks, awareness of deficits, visual scanning to the right of midline   Pain   no complaints of pain   Continue to assess pain q shift and PRN  Tylenol 325-650 mg q 4 hrs PRN for pain   Skin   No skin issue noted  Continue to assess skin, and use epbc cream daily  Continue to monitor and  assess skin q shift       *See Care Plan and progress notes for long and short-term goals.   Barriers to Discharge: Family cannot provide assistance   Possible Resolutions to Barriers:  May need SNF or ALF placement depending on progress   Discharge Planning/Teaching Needs:  Plan is for pt to move to the [place] area where his brother and sister in-law are. They can not provide care, so plan is either SNF versus ALF depending upon level of care      Team Discussion:  Goals supervision-min assist level. R-inattention and impulsive in his movements. Difficulty clearing his right foot. Dys 1 thin liquid diet. Kinesis tape his R-foot to decrease swelling. More alert today and able to participate more in therapies.  Working on activity tolerance in therapies.  Revisions to Treatment Plan:  Plan to go to NH from here    Continued Need for Acute Rehabilitation Level of Care: The patient requires daily medical management by a physician with specialized training in physical medicine and rehabilitation for the following conditions: Daily direction of a multidisciplinary physical rehabilitation program to ensure safe treatment while eliciting the highest outcome that is of practical value to the patient.: Yes Daily medical management of patient stability for increased activity during participation in an intensive rehabilitation regime.: Yes Daily analysis of laboratory values and/or radiology reports with any subsequent need for medication adjustment of medical intervention for : Neurological problems;Nutritional problems   Lucy Chris 02/14/2016, 9:48 AM       Patient ID: [name], male   DOB: 09/30/1960, 56 y.o.   MRN: [id number]

## 2016-02-15 ENCOUNTER — Inpatient Hospital Stay (HOSPITAL_COMMUNITY): Payer: Medicare Other | Admitting: Physical Therapy

## 2016-02-15 ENCOUNTER — Inpatient Hospital Stay (HOSPITAL_COMMUNITY): Payer: Medicare Other | Admitting: Speech Pathology

## 2016-02-15 ENCOUNTER — Inpatient Hospital Stay (HOSPITAL_COMMUNITY): Payer: Medicare Other | Admitting: Occupational Therapy

## 2016-02-15 ENCOUNTER — Inpatient Hospital Stay (HOSPITAL_COMMUNITY): Payer: Medicare Other

## 2016-02-15 DIAGNOSIS — R609 Edema, unspecified: Secondary | ICD-10-CM

## 2016-02-15 LAB — GLUCOSE, CAPILLARY
Glucose-Capillary: 85 mg/dL (ref 65–99)
Glucose-Capillary: 108 mg/dL — ABNORMAL HIGH (ref 65–99)
Glucose-Capillary: 109 mg/dL — ABNORMAL HIGH (ref 65–99)
Glucose-Capillary: 121 mg/dL — ABNORMAL HIGH (ref 65–99)

## 2016-02-15 MED ORDER — FUROSEMIDE 40 MG PO TABS
80.0000 mg | ORAL_TABLET | Freq: Two times a day (BID) | ORAL | Status: DC
  Administered 2016-02-17 – 2016-02-27 (×21): 80 mg via ORAL
  Filled 2016-02-15 (×21): qty 2

## 2016-02-15 MED ORDER — PANTOPRAZOLE SODIUM 40 MG PO PACK
40.0000 mg | PACK | Freq: Every day | ORAL | Status: DC
  Administered 2016-02-16 – 2016-02-18 (×2): 40 mg via ORAL
  Filled 2016-02-15 (×4): qty 20

## 2016-02-15 MED ORDER — ASPIRIN 325 MG PO TABS
325.0000 mg | ORAL_TABLET | Freq: Every day | ORAL | Status: DC
  Administered 2016-02-15 – 2016-02-27 (×13): 325 mg via ORAL
  Filled 2016-02-15 (×13): qty 1

## 2016-02-15 MED ORDER — FUROSEMIDE 40 MG PO TABS
80.0000 mg | ORAL_TABLET | Freq: Three times a day (TID) | ORAL | Status: AC
  Administered 2016-02-15 – 2016-02-16 (×5): 80 mg via ORAL
  Filled 2016-02-15 (×5): qty 2

## 2016-02-15 MED ORDER — POTASSIUM CHLORIDE CRYS ER 20 MEQ PO TBCR
40.0000 meq | EXTENDED_RELEASE_TABLET | Freq: Every day | ORAL | Status: DC
  Administered 2016-02-15: 40 meq via ORAL
  Filled 2016-02-15: qty 2

## 2016-02-15 MED ORDER — ISOSORBIDE DINITRATE 10 MG PO TABS
10.0000 mg | ORAL_TABLET | Freq: Three times a day (TID) | ORAL | Status: DC
  Administered 2016-02-15 – 2016-02-27 (×37): 10 mg via ORAL
  Filled 2016-02-15 (×40): qty 1

## 2016-02-15 NOTE — Progress Notes (Signed)
Subjective/Complaints: Up in w/c. No complaints. Appears comfortable.   ROS limited by language.  Objective: Vital Signs: Blood pressure (!) 158/84, pulse 68, temperature 98.7 F (37.1 C), temperature source Oral, resp. rate 18, height 5\' 11"  (1.803 m), weight 97.2 kg (214 lb 4.6 oz), SpO2 99 %. No results found. Results for orders placed or performed during the hospital encounter of 02/11/16 (from the past 72 hour(s))  Glucose, capillary     Status: Abnormal   Collection Time: 02/12/16 12:17 PM  Result Value Ref Range   Glucose-Capillary 149 (H) 65 - 99 mg/dL  Glucose, capillary     Status: Abnormal   Collection Time: 02/12/16  4:49 PM  Result Value Ref Range   Glucose-Capillary 105 (H) 65 - 99 mg/dL  Glucose, capillary     Status: Abnormal   Collection Time: 02/12/16  9:24 PM  Result Value Ref Range   Glucose-Capillary 163 (H) 65 - 99 mg/dL  Glucose, capillary     Status: None   Collection Time: 02/13/16  6:25 AM  Result Value Ref Range   Glucose-Capillary 75 65 - 99 mg/dL  Glucose, capillary     Status: None   Collection Time: 02/13/16 11:51 AM  Result Value Ref Range   Glucose-Capillary 80 65 - 99 mg/dL  Glucose, capillary     Status: Abnormal   Collection Time: 02/13/16  4:46 PM  Result Value Ref Range   Glucose-Capillary 114 (H) 65 - 99 mg/dL  Glucose, capillary     Status: Abnormal   Collection Time: 02/13/16  9:26 PM  Result Value Ref Range   Glucose-Capillary 156 (H) 65 - 99 mg/dL  Glucose, capillary     Status: None   Collection Time: 02/14/16  6:29 AM  Result Value Ref Range   Glucose-Capillary 99 65 - 99 mg/dL  Glucose, capillary     Status: Abnormal   Collection Time: 02/14/16 11:40 AM  Result Value Ref Range   Glucose-Capillary 146 (H) 65 - 99 mg/dL  Glucose, capillary     Status: Abnormal   Collection Time: 02/14/16  4:31 PM  Result Value Ref Range   Glucose-Capillary 106 (H) 65 - 99 mg/dL  Glucose, capillary     Status: Abnormal   Collection  Time: 02/14/16  8:43 PM  Result Value Ref Range   Glucose-Capillary 138 (H) 65 - 99 mg/dL  Glucose, capillary     Status: None   Collection Time: 02/15/16  6:48 AM  Result Value Ref Range   Glucose-Capillary 85 65 - 99 mg/dL     HEENT: normal Cardio: RRR Resp: clear without wheezes GI: BS positive and NT, ND Extremity:  Pulses positive and persistent 3+ Right foot Edema Skin:   Intact Neuro: alert.  Flat, Abnormal Sensory reduced sensation to pinch in RLE, Abnormal Motor 4/5 LUE and LLE, 2- in R Delt Bi Tri 3- Grip, 3- Right HF KE ADF, Abnormal FMC Ataxic/ dec FMC, Dysarthric but speech clearer and Aphasic (stable) Musc/Skel:  Other no tenderness to palpation RIght foot Gen NAD    Assessment/Plan: 1. Functional deficits secondary to Left MCA infarct with aphasia, Right hemiparesis, dysarthria which require 3+ hours per day of interdisciplinary therapy in a comprehensive inpatient rehab setting. Physiatrist is providing close team supervision and 24 hour management of active medical problems listed below. Physiatrist and rehab team continue to assess barriers to discharge/monitor patient progress toward functional and medical goals. FIM: Function - Bathing Position: Shower Body parts bathed by patient: Right  arm, Chest, Abdomen, Front perineal area, Right upper leg, Left upper leg, Right lower leg, Left lower leg Body parts bathed by helper: Left arm, Buttocks Assist Level:  (Max A)  Function- Upper Body Dressing/Undressing What is the patient wearing?: Pull over shirt/dress Pull over shirt/dress - Perfomed by patient: Put head through opening Pull over shirt/dress - Perfomed by helper: Pull shirt over trunk, Thread/unthread left sleeve, Thread/unthread right sleeve Assist Level: Touching or steadying assistance(Pt > 75%) (1/4, 25%, max assist) Function - Lower Body Dressing/Undressing What is the patient wearing?: Underwear, Pants, Non-skid slipper socks, Ted Hose Position:  Wheelchair/chair at [employer] - Performed by patient: Thread/unthread right underwear leg Underwear - Performed by helper: Thread/unthread left underwear leg, Pull underwear up/down Pants- Performed by patient: Thread/unthread left pants leg Pants- Performed by helper: Thread/unthread right pants leg, Pull pants up/down Non-skid slipper socks- Performed by helper: Don/doff left sock, Don/doff right sock TED Hose - Performed by helper: Don/doff left TED hose, Don/doff right TED hose Assist for footwear: Dependant Assist for lower body dressing:  (Total A)  Function - Toileting Toileting activity did not occur: No continent bowel/bladder event Toileting steps completed by patient: Performs perineal hygiene Toileting steps completed by helper: Adjust clothing prior to toileting, Adjust clothing after toileting Assist level: Two helpers (per Rona Ravens, NT)  Function - Archivist transfer activity did not occur: Refused Toilet transfer assistive device: Elevated toilet seat/BSC over toilet, Grab bar Assist level to toilet: Moderate assist (Pt 50 - 74%/lift or lower) Assist level from toilet: Moderate assist (Pt 50 - 74%/lift or lower)  Function - Chair/bed transfer Chair/bed transfer method: Ambulatory Chair/bed transfer assist level: Touching or steadying assistance (Pt > 75%) Chair/bed transfer assistive device: Armrests, Walker  Function - Locomotion: Wheelchair Will patient use wheelchair at discharge?: Yes Type: Manual Max wheelchair distance: 151ft Assist Level: Supervision or verbal cues Assist Level: Supervision or verbal cues Turns around,maneuvers to table,bed, and toilet,negotiates 3% grade,maneuvers on rugs and over doorsills: No Function - Locomotion: Ambulation Assistive device: Walker-rolling Max distance: 136ft Assist level: Moderate assist (Pt 50 - 74%) Assist level: Touching or steadying assistance (Pt > 75%) Assist level: Moderate assist (Pt  50 - 74%) Walk 10 feet on uneven surfaces activity did not occur: Safety/medical concerns  Function - Comprehension Comprehension: Auditory Comprehension assist level: Understands basic 90% of the time/cues < 10% of the time  Function - Expression Expression: Verbal Expression assist level: Expresses basic 50 - 74% of the time/requires cueing 25 - 49% of the time. Needs to repeat parts of sentences.  Function - Social Interaction Social Interaction assist level: Interacts appropriately 50 - 74% of the time - May be physically or verbally inappropriate.  Function - Problem Solving Problem solving assist level: Solves basic 50 - 74% of the time/requires cueing 25 - 49% of the time  Function - Memory Memory assist level: Recognizes or recalls 50 - 74% of the time/requires cueing 25 - 49% of the time Patient normally able to recall (first 3 days only): Current season, That he or she is in a hospital  Medical Problem List and Plan: 1.  Left hemiparesis, aphasia, dysphagia dysarthria secondary to left MCA infarct Cont CIR PT, OT SLP   -arousal better.  2.  DVT Prophylaxis/Anticoagulation: Lovenox 40 mg daily 3. Pain Management: Tylenol as needed 4. Mood: Celexa 40 mg daily 5. Neuropsych: This patient is capable of making decisions on he is own behalf. 6. Skin/Wound Care: Routine skin checks  7. Fluids/Electrolytes/Nutrition:               Dysphagia 1 thin, advance as tolerated 8. Acute on chronic combined congestive heart failure. Continue Lasix. Monitor for any signs of fluid overload.  -weight trending up  -increase lasix to 80mg  TID for 2 days then back to bid  -f/u BMET on monday 9. Hypertension. Cardura 4 mg daily, Imdur 30 mg daily, Coreg 50 mg twice a day, lisinopril 20 mg daily. Monitor with increased mobility  Vitals:   02/14/16 0804 02/15/16 0612  BP: (!) 156/92 (!) 158/84  Pulse:  68  Resp:  18  Temp:  98.7 F (37.1 C)   10. Diabetes mellitus with peripheral  neuropathy. Hemoglobin A1c 6.4. Lantus insulin 5 units daily. Check blood sugars before meals and at bedtime. Diabetic teaching   -sugars reasonable  CBG (last 3)   Recent Labs  02/14/16 1631 02/14/16 2043 02/15/16 0648  GLUCAP 106* 138* 85    11. Ulcerative colitis. Sulfasalazine 2000 mg twice a day 12. BPH. Flomax 0.4 mg daily at bedtime. Check PVR 3 13. Hyperlipidemia. Lipitor 14. CRI stage III. Creatinine 1.50-1.58, remains, avoid NSAIDs and Metformin, proteinuria noted 15. LE edema/Lymphedema: Cont diuretics and compression.Hypoalbuminemia is contributing--added prostat  LOS (Days) 4 A FACE TO FACE EVALUATION WAS PERFORMED  SWARTZ,ZACHARY T 02/15/2016, 9:13 AM

## 2016-02-15 NOTE — Progress Notes (Signed)
Occupational Therapy Session Note  Patient Details  Name: [name] MRN: [id number] Date of Birth: 1960-07-30  Today's Date: 02/15/2016 OT Individual Time: 1001-1057 OT Individual Time Calculation (min): 56 min     Short Term Goals: Week 1:  OT Short Term Goal 1 (Week 1): Pt will don shirt using hemi-techniques with Mod A OT Short Term Goal 2 (Week 1): Pt will complete toilet transfer with Mod A OT Short Term Goal 3 (Week 1): Pt will recall hemi-dressing techniques 2 consecutive days with min questioning cues OT Short Term Goal 4 (Week 1): Pt will use the R UE as a stabilizer during functional activities with Min A  Skilled Therapeutic Interventions/Progress Updates:    Pt completed bathing and dressing during session.  Transferred from the toilet to the tub bench with mod assist.  He was able to bath with mod instructional cueing for sequencing and initiating.    Pt also needing cueing to sequence wetting washcloth and applying soap as he initially attempted to wash with just soap on the dry washcloth.  Mod hand over hand assist for integration of the RUE for washing the left arm.  Min assist for sit to stand using the grab bars while therapist assisted with peri hygiene after he completed part of it.  He ambulated out to the wheelchair in the room with mod assist and no assistive device.  Mod instructional cueing with mod assist for pullover shirt, and underpants secondary to apraxia and decreased orientation.  He was able to thread his pants and pull over his hips with only min assist however.  Mod assist for donning right sock, crossing the RLE over the left knee with supervision and increased time to donn the right using the LUE only.  Pt left in wheelchair with call button in reach and safety belt in place.    Therapy Documentation Precautions:  Precautions Precautions: Fall Precaution Comments: right sided weakness Restrictions Weight Bearing Restrictions: No  Pain: Pain  Assessment Pain Assessment: No/denies pain ADL: See Function Navigator for Current Functional Status.   Therapy/Group: Individual Therapy  Jeriko Kowalke OTR/L 02/15/2016, 12:35 PM

## 2016-02-15 NOTE — Progress Notes (Signed)
Physical Therapy Session Note  Patient Details  Name: [name] MRN: [id number] Date of Birth: 04-09-1960  Today's Date: 02/15/2016 PT Individual Time: 0800-0830 PT Individual Time Calculation (min): 30 min    Short Term Goals:Week 1:  PT Short Term Goal 1 (Week 1): Patient will transfer bed <> wheelchair with min A using LRAD. PT Short Term Goal 2 (Week 1): Patient will ambulate 150 ft using LRAD with min A.  PT Short Term Goal 3 (Week 1): Patient will negotiate up/down 12 stairs using 2 rails with min A for strengthening.  PT Short Term Goal 4 (Week 1): Patient will maintain dynamic standing balance x 2 min with min A.   Skilled Therapeutic Interventions/Progress Updates:   Pt received supine in bed and agreeable to PT. Supine>sit transfer with supervision assist with HOB elevated and slight use of bed rails as well as min cues proper sequencing.   PT assisted patient to don ted hose and grip socks while sitting EOB with distant supervision assist.   Ambulatory transfer to [facility] with RW and min assist from PT. Min cues for awareness of the RUE and proper RLE positioning prior to sit<>stand.   PT instructed patient in 5xSTS : 66 seconds with BUE support and min assist from PT. Pt required min cues for proper set up of BUE and R LE prior to initial sit<>stand. ( >15 seconds = increased fall risk)   TUG with RW: 60 seconds PT provided Min assist and min cues for improved heel contact and R LE foot clearance. ( >13 seconds indicates increased fall risk.   Patient returned too room and left sitting in [facility] with call bell in reach and all needs met.           Therapy Documentation Precautions:  Precautions Precautions: Fall Precaution Comments: right sided weakness Restrictions Weight Bearing Restrictions: No General:   Vital Signs: Therapy Vitals Temp: 98.7 F (37.1 C) Temp Source: Oral Pulse Rate: 68 Resp: 18 BP: (!) 158/84 Patient Position (if appropriate):  Lying Oxygen Therapy SpO2: 99 % O2 Device: Not Delivered Pain:   0/10  See Function Navigator for Current Functional Status.   Therapy/Group: Individual Therapy  Lorie Phenix 02/15/2016, 7:43 AM

## 2016-02-15 NOTE — Progress Notes (Signed)
Physical Therapy Session Note  Patient Details  Name: [name] MRN: [id number] Date of Birth: Dec 09, 1960  Today's Date: 02/15/2016 PT Individual Time: 1950-9326 PT Individual Time Calculation (min): 57 min    Short Term Goals: Week 1:  PT Short Term Goal 1 (Week 1): Patient will transfer bed <> wheelchair with min A using LRAD. PT Short Term Goal 2 (Week 1): Patient will ambulate 150 ft using LRAD with min A.  PT Short Term Goal 3 (Week 1): Patient will negotiate up/down 12 stairs using 2 rails with min A for strengthening.  PT Short Term Goal 4 (Week 1): Patient will maintain dynamic standing balance x 2 min with min A.   Skilled Therapeutic Interventions/Progress Updates:    Patient seen for lymphedema wrapping per MD order for R LE.  Noted to have +2 pitting edema mainly at dorsum of foot and malleoli. Measurements as follows:  R LE: MT heads 28.5 cm; figure 8 around ankle 62cm; 5 cm prox to med mall 27 cm; 10 cm prox to med mall 25 cm. L LE: MT heads 28 cm; figure 8 around ankle 60.8; 5 cm prox to med mall 26 cm; 10 cm prox to med mall 24 cm.  Skin was prepped with lotion and applied stockinette then wrapped with Artiflex and comprilan bandages 6cm, 8cm and 10 cm x 2.    Pt assisted up to walk with RW and min A 150' to note any pain or changes in mobility or edema.  Patient denies pain or any change in mobility, but noted increased swelling at toes and most distal portion of dorsum of foot.  Applied 4cm Mollelast to first 3 toes on R foot. Will follow up to check on pt tomorrow. Patient left in supine and all needs in reach.  Therapy Documentation Precautions:  Precautions Precautions: Fall Precaution Comments: right sided weakness Restrictions Weight Bearing Restrictions: No Pain:  Denies pain   See Function Navigator for Current Functional Status.   Therapy/Group: Individual Therapy  Elray Mcgregor  [place], [place] [phone] 02/15/2016  02/15/2016, 3:05 PM

## 2016-02-15 NOTE — Progress Notes (Signed)
Speech Language Pathology Daily Session Note  Patient Details  Name: [name] MRN: [id number] Date of Birth: 1960-06-09  Today's Date: 02/15/2016 SLP Individual Time: 1335-1405 SLP Individual Time Calculation (min): 30 min   Short Term Goals: Week 1: SLP Short Term Goal 1 (Week 1): Pt will consume dys 1 textures and thin liquids with supervision verbal cues for use of swallowing precautions and minimal overt s/s of aspiration.   SLP Short Term Goal 2 (Week 1): Pt will consume therapeutic trials of dys 2 textures with min verbal cues for use of swallowing precautions over 2 consecutive sessions prior to advancement.   SLP Short Term Goal 3 (Week 1): Pt will visually scan to the right of midline during basic functional tasks in >75% of opportunities wtih supervision verbal cues.   SLP Short Term Goal 4 (Week 1): Pt will recognize and correct verbal errors in the moment during phrase/sentence level expression with min assist verbal cues.  SLP Short Term Goal 5 (Week 1): Pt will utilize increased vocal intensity and overarticulation to achieve intelligibility at the sentence level with min verbal cues.   SLP Short Term Goal 6 (Week 1): Pt will utilize compensatory word finding strategies at the phrase/sentence level with min assist verbal cues.    Skilled Therapeutic Interventions:  Pt was seen for skilled ST targeting goals for dysphagia and communication.  Pt asleep upon arrival but awakened to voice and agreeable to participating in ST.  Pt needed mod-max assist verbal cues for attention to boluses during lunch meal today which appeared to be due to lethargy.  Pt also needed mod-max assist verbal cues for functional communication because of lethargy.  Pt's speech more dysarthric and verbal output was more tangential and difficult to follow.  RN made aware.  Pt left in bed with bed alarm set and call bell within reach.  Continue per current plan of care.    Function:  Eating Eating    Modified Consistency Diet: Yes Eating Assist Level: Set up assist for;Supervision or verbal cues;Helper checks for pocketed food   Eating Set Up Assist For: Opening containers       Cognition Comprehension Comprehension assist level: Understands basic 90% of the time/cues < 10% of the time  Expression   Expression assist level: Expresses basic 50 - 74% of the time/requires cueing 25 - 49% of the time. Needs to repeat parts of sentences.  Social Interaction Social Interaction assist level: Interacts appropriately 50 - 74% of the time - May be physically or verbally inappropriate.  Problem Solving Problem solving assist level: Solves basic 50 - 74% of the time/requires cueing 25 - 49% of the time  Memory Memory assist level: Recognizes or recalls 50 - 74% of the time/requires cueing 25 - 49% of the time    Pain Pain Assessment Pain Assessment: No/denies pain  Therapy/Group: Individual Therapy  Trameka Dorough, Melanee Spry 02/15/2016, 4:29 PM

## 2016-02-16 ENCOUNTER — Inpatient Hospital Stay (HOSPITAL_COMMUNITY): Payer: Medicare Other | Admitting: Speech Pathology

## 2016-02-16 DIAGNOSIS — E1142 Type 2 diabetes mellitus with diabetic polyneuropathy: Secondary | ICD-10-CM

## 2016-02-16 DIAGNOSIS — I5043 Acute on chronic combined systolic (congestive) and diastolic (congestive) heart failure: Secondary | ICD-10-CM

## 2016-02-16 DIAGNOSIS — I10 Essential (primary) hypertension: Secondary | ICD-10-CM

## 2016-02-16 DIAGNOSIS — G8191 Hemiplegia, unspecified affecting right dominant side: Secondary | ICD-10-CM

## 2016-02-16 LAB — GLUCOSE, CAPILLARY
GLUCOSE-CAPILLARY: 112 mg/dL — AB (ref 65–99)
GLUCOSE-CAPILLARY: 120 mg/dL — AB (ref 65–99)
Glucose-Capillary: 82 mg/dL (ref 65–99)
Glucose-Capillary: 117 mg/dL — ABNORMAL HIGH (ref 65–99)

## 2016-02-16 MED ORDER — BISACODYL 10 MG RE SUPP
10.0000 mg | Freq: Every day | RECTAL | Status: DC | PRN
  Administered 2016-02-17: 10 mg via RECTAL
  Filled 2016-02-16: qty 1

## 2016-02-16 MED ORDER — POTASSIUM CHLORIDE 20 MEQ/15ML (10%) PO SOLN
40.0000 meq | Freq: Every day | ORAL | Status: DC
  Administered 2016-02-16: 40 meq via ORAL
  Filled 2016-02-16 (×2): qty 30

## 2016-02-16 NOTE — Progress Notes (Signed)
Patient refused to wear CPAP tonight. Verbal education provided. Will monitor.

## 2016-02-16 NOTE — Progress Notes (Signed)
Speech Language Pathology Daily Session Note  Patient Details  Name: [name] MRN: [id number] Date of Birth: 1960/08/16  Today's Date: 02/16/2016 SLP Individual Time: 0930-1010 SLP Individual Time Calculation (min): 40 min   Short Term Goals: Week 1: SLP Short Term Goal 1 (Week 1): Pt will consume dys 1 textures and thin liquids with supervision verbal cues for use of swallowing precautions and minimal overt s/s of aspiration.   SLP Short Term Goal 2 (Week 1): Pt will consume therapeutic trials of dys 2 textures with min verbal cues for use of swallowing precautions over 2 consecutive sessions prior to advancement.   SLP Short Term Goal 3 (Week 1): Pt will visually scan to the right of midline during basic functional tasks in >75% of opportunities wtih supervision verbal cues.   SLP Short Term Goal 4 (Week 1): Pt will recognize and correct verbal errors in the moment during phrase/sentence level expression with min assist verbal cues.  SLP Short Term Goal 5 (Week 1): Pt will utilize increased vocal intensity and overarticulation to achieve intelligibility at the sentence level with min verbal cues.   SLP Short Term Goal 6 (Week 1): Pt will utilize compensatory word finding strategies at the phrase/sentence level with min assist verbal cues.    Skilled Therapeutic Interventions: Skilled treatment session focused on functional communication. Upon arrival, patient was asleep but was agreeable to participate in treatment session. During structured verbal tasks, patient was Mod I for word-finding at the word and phrase level with Min A verbal cues needed for use of speech intelligibility strategies. However, patient required Max A multimodal cues and Mod A verbal cues for use of speech intelligibility strategies during informal, unstructured conversation that focused on biographical information or topic of interests. Patient appeared lethargic throughout session. Patient left supine in bed with all  needs within reach. Continue with current plan of care.   Function:  Cognition Comprehension Comprehension assist level: Understands basic 90% of the time/cues < 10% of the time  Expression   Expression assist level: Expresses basic 50 - 74% of the time/requires cueing 25 - 49% of the time. Needs to repeat parts of sentences.  Social Interaction Social Interaction assist level: Interacts appropriately 50 - 74% of the time - May be physically or verbally inappropriate.  Problem Solving Problem solving assist level: Solves basic 50 - 74% of the time/requires cueing 25 - 49% of the time  Memory Memory assist level: Recognizes or recalls 50 - 74% of the time/requires cueing 25 - 49% of the time    Pain No/Denies Pain   Therapy/Group: Individual Therapy  [name] 02/16/2016, 3:15 PM

## 2016-02-16 NOTE — Progress Notes (Signed)
Subjective/Complaints: Pt sitting up in bed this AM, eating breakfast.  He indicates he slept well overnight.   ROS: Denies CP, SOB, N/V/D.  Objective: Vital Signs: Blood pressure (!) 160/99, pulse 64, temperature 98.2 F (36.8 C), temperature source Oral, resp. rate 18, height 5\' 11"  (1.803 m), weight 95.1 kg (209 lb 10.5 oz), SpO2 99 %. No results found. Results for orders placed or performed during the hospital encounter of 02/11/16 (from the past 72 hour(s))  Glucose, capillary     Status: None   Collection Time: 02/13/16 11:51 AM  Result Value Ref Range   Glucose-Capillary 80 65 - 99 mg/dL  Glucose, capillary     Status: Abnormal   Collection Time: 02/13/16  4:46 PM  Result Value Ref Range   Glucose-Capillary 114 (H) 65 - 99 mg/dL  Glucose, capillary     Status: Abnormal   Collection Time: 02/13/16  9:26 PM  Result Value Ref Range   Glucose-Capillary 156 (H) 65 - 99 mg/dL  Glucose, capillary     Status: None   Collection Time: 02/14/16  6:29 AM  Result Value Ref Range   Glucose-Capillary 99 65 - 99 mg/dL  Glucose, capillary     Status: Abnormal   Collection Time: 02/14/16 11:40 AM  Result Value Ref Range   Glucose-Capillary 146 (H) 65 - 99 mg/dL  Glucose, capillary     Status: Abnormal   Collection Time: 02/14/16  4:31 PM  Result Value Ref Range   Glucose-Capillary 106 (H) 65 - 99 mg/dL  Glucose, capillary     Status: Abnormal   Collection Time: 02/14/16  8:43 PM  Result Value Ref Range   Glucose-Capillary 138 (H) 65 - 99 mg/dL  Glucose, capillary     Status: None   Collection Time: 02/15/16  6:48 AM  Result Value Ref Range   Glucose-Capillary 85 65 - 99 mg/dL  Glucose, capillary     Status: Abnormal   Collection Time: 02/15/16 11:35 AM  Result Value Ref Range   Glucose-Capillary 108 (H) 65 - 99 mg/dL  Glucose, capillary     Status: Abnormal   Collection Time: 02/15/16  4:21 PM  Result Value Ref Range   Glucose-Capillary 109 (H) 65 - 99 mg/dL  Glucose,  capillary     Status: Abnormal   Collection Time: 02/15/16  8:55 PM  Result Value Ref Range   Glucose-Capillary 121 (H) 65 - 99 mg/dL   Comment 1 Notify RN   Glucose, capillary     Status: None   Collection Time: 02/16/16  6:34 AM  Result Value Ref Range   Glucose-Capillary 82 65 - 99 mg/dL   Comment 1 Notify RN      HEENT: Normocephaic, atraumatic Cardio: RRR. No JVD Resp: clear without wheezes. Unlabored. GI: BS positive and NT, ND Skin:   Intact. Warm and dry.  Neuro: alert.   Flat Motor: 4/5 LUE proximal to distal LLE: 4/5 proxima to distal  RUE: 4-/5 proximal to distal RLE: 4-/5 HF KE ADF Dysarthric speech  Musc/Skel:  No tenderness. Right foot Edema Gen NAD. Vital signs reviewed.    Assessment/Plan: 1. Functional deficits secondary to Left MCA infarct with aphasia, Right hemiparesis, dysarthria which require 3+ hours per day of interdisciplinary therapy in a comprehensive inpatient rehab setting. Physiatrist is providing close team supervision and 24 hour management of active medical problems listed below. Physiatrist and rehab team continue to assess barriers to discharge/monitor patient progress toward functional and medical goals. FIM: Function -  Bathing Position: Shower Body parts bathed by patient: Right arm, Chest, Abdomen, Front perineal area, Right upper leg, Left upper leg, Right lower leg, Left lower leg Body parts bathed by helper: Back, Left arm, Buttocks Assist Level:  (Max A)  Function- Upper Body Dressing/Undressing What is the patient wearing?: Pull over shirt/dress Pull over shirt/dress - Perfomed by patient: Put head through opening, Thread/unthread left sleeve Pull over shirt/dress - Perfomed by helper: Thread/unthread right sleeve, Pull shirt over trunk Assist Level: Touching or steadying assistance(Pt > 75%) (1/4, 25%, max assist) Function - Lower Body Dressing/Undressing What is the patient wearing?: Underwear, Pants, Non-skid slipper socks, Ted  Hose Position: Wheelchair/chair at [employer] - Performed by patient: Thread/unthread left underwear leg, Pull underwear up/down Underwear - Performed by helper: Thread/unthread right underwear leg Pants- Performed by patient: Thread/unthread right pants leg, Thread/unthread left pants leg, Pull pants up/down Pants- Performed by helper: Thread/unthread right pants leg, Pull pants up/down Non-skid slipper socks- Performed by patient: Don/doff left sock Non-skid slipper socks- Performed by helper: Don/doff right sock TED Hose - Performed by helper: Don/doff right TED hose, Don/doff left TED hose Assist for footwear: Dependant Assist for lower body dressing:  (Total A)  Function - Toileting Toileting activity did not occur: No continent bowel/bladder event Toileting steps completed by patient: Adjust clothing prior to toileting, Performs perineal hygiene Toileting steps completed by helper: Adjust clothing prior to toileting, Adjust clothing after toileting Assist level: Two helpers (per Rona Ravens, NT)  Function - Archivist transfer activity did not occur: Refused Toilet transfer assistive device: Elevated toilet seat/BSC over toilet Assist level to toilet: Moderate assist (Pt 50 - 74%/lift or lower) Assist level from toilet: Moderate assist (Pt 50 - 74%/lift or lower)  Function - Chair/bed transfer Chair/bed transfer method: Ambulatory Chair/bed transfer assist level: Touching or steadying assistance (Pt > 75%) Chair/bed transfer assistive device: Armrests, Walker  Function - Locomotion: Wheelchair Will patient use wheelchair at discharge?: Yes Type: Manual Max wheelchair distance: 132ft Assist Level: Supervision or verbal cues Assist Level: Supervision or verbal cues Turns around,maneuvers to table,bed, and toilet,negotiates 3% grade,maneuvers on rugs and over doorsills: No Function - Locomotion: Ambulation Assistive device: Walker-rolling Max distance:  150 Assist level: Touching or steadying assistance (Pt > 75%) Assist level: Touching or steadying assistance (Pt > 75%) Assist level: Touching or steadying assistance (Pt > 75%) Walk 10 feet on uneven surfaces activity did not occur: Safety/medical concerns  Function - Comprehension Comprehension: Auditory Comprehension assist level: Understands basic 90% of the time/cues < 10% of the time  Function - Expression Expression: Verbal Expression assist level: Expresses basic 50 - 74% of the time/requires cueing 25 - 49% of the time. Needs to repeat parts of sentences.  Function - Social Interaction Social Interaction assist level: Interacts appropriately 50 - 74% of the time - May be physically or verbally inappropriate.  Function - Problem Solving Problem solving assist level: Solves basic 50 - 74% of the time/requires cueing 25 - 49% of the time  Function - Memory Memory assist level: Recognizes or recalls 50 - 74% of the time/requires cueing 25 - 49% of the time Patient normally able to recall (first 3 days only): Current season, That he or she is in a hospital  Medical Problem List and Plan: 1.  Right hemiparesis, aphasia, dysphagia dysarthria secondary to left MCA infarct  Cont CIR PT, OT SLP 2.  DVT Prophylaxis/Anticoagulation: Lovenox 40 mg daily 3. Pain Management: Tylenol as needed 4. Mood:  Celexa 40 mg daily 5. Neuropsych: This patient is capable of making decisions on he is own behalf. 6. Skin/Wound Care: Routine skin checks 7. Fluids/Electrolytes/Nutrition:               Dysphagia 1 thin, advance as tolerated 8. Acute on chronic combined congestive heart failure. Continue Lasix. Monitor for any signs of fluid overload.  -weight trending up  -increase lasix to 80mg  TID for 2 days then back to bid on Monday  -f/u BMET on monday 9. Hypertension. Cardura 4 mg daily, Imdur 30 mg daily, Coreg 50 mg twice a day, lisinopril 20 mg daily. Monitor with increased mobility  Vitals:    02/15/16 2114 02/16/16 0453  BP: (!) 153/92 (!) 160/99  Pulse:  64  Resp:  18  Temp:  98.2 F (36.8 C)   Elevated, will monitor with increase in Lasix 10. Diabetes mellitus with peripheral neuropathy. Hemoglobin A1c 6.4. Lantus insulin 5 units daily. Check blood sugars before meals and at bedtime. Diabetic teaching   CBGs controlled 1/13  CBG (last 3)   Recent Labs  02/15/16 1621 02/15/16 2055 02/16/16 0634  GLUCAP 109* 121* 82    11. Ulcerative colitis. Sulfasalazine 2000 mg twice a day 12. BPH. Flomax 0.4 mg daily at bedtime. Check PVR 3 13. Hyperlipidemia. Lipitor 14. CRI stage III. Creatinine 1.50-1.58, remains, avoid NSAIDs and Metformin, proteinuria noted 15. LE edema/Lymphedema: Cont diuretics and compression.Hypoalbuminemia is contributing--added prostat  LOS (Days) 5 A FACE TO FACE EVALUATION WAS PERFORMED  Ankit Karis Juba 02/16/2016, 8:11 AM

## 2016-02-17 ENCOUNTER — Inpatient Hospital Stay (HOSPITAL_COMMUNITY): Payer: Medicare Other | Admitting: Occupational Therapy

## 2016-02-17 ENCOUNTER — Inpatient Hospital Stay (HOSPITAL_COMMUNITY): Payer: Medicare Other | Admitting: Physical Therapy

## 2016-02-17 DIAGNOSIS — N183 Chronic kidney disease, stage 3 (moderate): Secondary | ICD-10-CM

## 2016-02-17 LAB — GLUCOSE, CAPILLARY
GLUCOSE-CAPILLARY: 79 mg/dL (ref 65–99)
Glucose-Capillary: 100 mg/dL — ABNORMAL HIGH (ref 65–99)
Glucose-Capillary: 139 mg/dL — ABNORMAL HIGH (ref 65–99)
Glucose-Capillary: 122 mg/dL — ABNORMAL HIGH (ref 65–99)

## 2016-02-17 MED ORDER — POTASSIUM CHLORIDE 20 MEQ PO PACK
40.0000 meq | PACK | Freq: Every day | ORAL | Status: DC
  Administered 2016-02-18: 40 meq via ORAL
  Filled 2016-02-17 (×2): qty 2

## 2016-02-17 MED ORDER — POTASSIUM CHLORIDE 20 MEQ PO PACK
40.0000 meq | PACK | Freq: Once | ORAL | Status: AC
  Administered 2016-02-17: 40 meq via ORAL
  Filled 2016-02-17: qty 2

## 2016-02-17 NOTE — Progress Notes (Signed)
Patient vomited during med administration. Says medications taste horrible and make him sick. Will continue to monitor.

## 2016-02-17 NOTE — Progress Notes (Signed)
Physical Therapy Session Note  Patient Details  Name: [name] MRN: [id number] Date of Birth: 1960/12/14  Today's Date: 02/17/2016 PT Individual Time: 1300-1345 PT Individual Time Calculation (min): 45 min    Short Term Goals: Week 1:  PT Short Term Goal 1 (Week 1): Patient will transfer bed <> wheelchair with min A using LRAD. PT Short Term Goal 2 (Week 1): Patient will ambulate 150 ft using LRAD with min A.  PT Short Term Goal 3 (Week 1): Patient will negotiate up/down 12 stairs using 2 rails with min A for strengthening.  PT Short Term Goal 4 (Week 1): Patient will maintain dynamic standing balance x 2 min with min A.   Skilled Therapeutic Interventions/Progress Updates:    Pt was seen bedside in the pm. Pt performed multiple sit to stand transfers with min A and rolling walker with verbal cues. Pt ambulated 80 feet with rolling walker and min A with verbal cues. Treatment focused on NMR, utilizing step taps 1 set x 5 and 2 sets x 10 followed by 3 sets x 10 reps alternating step taps 3 sets x 10 reps each. Pt returned to room following treatment. Pt left sitting up in w/c with quick release belt in place and call bell within reach.   Therapy Documentation Precautions:  Precautions Precautions: Fall Precaution Comments: right sided weakness Restrictions Weight Bearing Restrictions: No General: Pain: No c/o pain.   See Function Navigator for Current Functional Status.   Therapy/Group: Individual Therapy  Rayford Halsted 02/17/2016, 2:13 PM

## 2016-02-17 NOTE — Progress Notes (Signed)
Occupational Therapy Session Note  Patient Details  Name: [name] MRN: [id number] Date of Birth: [birth year]  Today's Date: 02/17/2016 OT Individual Time: 0900-1000 OT Individual Time Calculation (min): 60 min     Short Term Goals: Week 1:  OT Short Term Goal 1 (Week 1): Pt will don shirt using hemi-techniques with Mod A OT Short Term Goal 2 (Week 1): Pt will complete toilet transfer with Mod A OT Short Term Goal 3 (Week 1): Pt will recall hemi-dressing techniques 2 consecutive days with min questioning cues OT Short Term Goal 4 (Week 1): Pt will use the R UE as a stabilizer during functional activities with Min A  Skilled Therapeutic Interventions/Progress Updates:    1:1 OT session focused on modified bathing/dressing, sequencing, initiation, sit<>stands, and functional use of R Ue. Stand-pivot transfers with increased time and Mod A. Instructional cues for sequencing during self-care tasks, cues to pull pants down prior to sitting on toilet. Pt with small Bm, requiring Max A for peri-care for thoroughness. Bathing/dressing completed wc level at sink  with focus on forced use of R Ue, sequencing, initiating, hemi-tecniques and improved sit<>stand. Pt left seated in wc at end of session with needs met.   Therapy Documentation Precautions:  Precautions Precautions: Fall Precaution Comments: right sided weakness Restrictions Weight Bearing Restrictions: No Pain: Pain Assessment Pain Assessment: No/denies pain ADL: ADL ADL Comments: Please see functional navigator  See Function Navigator for Current Functional Status.   Therapy/Group: Individual Therapy  Valma Cava 02/17/2016, 12:17 PM

## 2016-02-17 NOTE — Progress Notes (Signed)
Subjective/Complaints: Pt laying in bed this AM. He slept well overnight.  Denies complaints this AM.  ROS: Denies CP, SOB, N/V/D.  Objective: Vital Signs: Blood pressure (!) 157/92, pulse 65, temperature 98.9 F (37.2 C), temperature source Oral, resp. rate 17, height 5\' 11"  (1.803 m), weight 94.8 kg (209 lb), SpO2 94 %. No results found. Results for orders placed or performed during the hospital encounter of 02/11/16 (from the past 72 hour(s))  Glucose, capillary     Status: Abnormal   Collection Time: 02/14/16 11:40 AM  Result Value Ref Range   Glucose-Capillary 146 (H) 65 - 99 mg/dL  Glucose, capillary     Status: Abnormal   Collection Time: 02/14/16  4:31 PM  Result Value Ref Range   Glucose-Capillary 106 (H) 65 - 99 mg/dL  Glucose, capillary     Status: Abnormal   Collection Time: 02/14/16  8:43 PM  Result Value Ref Range   Glucose-Capillary 138 (H) 65 - 99 mg/dL  Glucose, capillary     Status: None   Collection Time: 02/15/16  6:48 AM  Result Value Ref Range   Glucose-Capillary 85 65 - 99 mg/dL  Glucose, capillary     Status: Abnormal   Collection Time: 02/15/16 11:35 AM  Result Value Ref Range   Glucose-Capillary 108 (H) 65 - 99 mg/dL  Glucose, capillary     Status: Abnormal   Collection Time: 02/15/16  4:21 PM  Result Value Ref Range   Glucose-Capillary 109 (H) 65 - 99 mg/dL  Glucose, capillary     Status: Abnormal   Collection Time: 02/15/16  8:55 PM  Result Value Ref Range   Glucose-Capillary 121 (H) 65 - 99 mg/dL   Comment 1 Notify RN   Glucose, capillary     Status: None   Collection Time: 02/16/16  6:34 AM  Result Value Ref Range   Glucose-Capillary 82 65 - 99 mg/dL   Comment 1 Notify RN   Glucose, capillary     Status: Abnormal   Collection Time: 02/16/16 11:30 AM  Result Value Ref Range   Glucose-Capillary 112 (H) 65 - 99 mg/dL  Glucose, capillary     Status: Abnormal   Collection Time: 02/16/16  4:28 PM  Result Value Ref Range   Glucose-Capillary 120 (H) 65 - 99 mg/dL  Glucose, capillary     Status: Abnormal   Collection Time: 02/16/16  8:59 PM  Result Value Ref Range   Glucose-Capillary 117 (H) 65 - 99 mg/dL   Comment 1 Notify RN   Glucose, capillary     Status: None   Collection Time: 02/17/16  6:35 AM  Result Value Ref Range   Glucose-Capillary 79 65 - 99 mg/dL   Comment 1 Notify RN      HEENT: Normocephaic, atraumatic Cardio: RRR. No JVD Resp: clear without wheezes. Unlabored. GI: BS positive and NT, ND Skin:   Intact. Warm and dry.  Neuro: alert.   Flat Motor: 4/5 LUE proximal to distal LLE: 4/5 proxima to distal  RUE: 4-/5 proximal to distal RLE: 4-/5 HF KE ADF Dysarthric speech  Musc/Skel:  No tenderness. Right foot Edema Gen NAD. Vital signs reviewed.    Assessment/Plan: 1. Functional deficits secondary to Left MCA infarct with aphasia, Right hemiparesis, dysarthria which require 3+ hours per day of interdisciplinary therapy in a comprehensive inpatient rehab setting. Physiatrist is providing close team supervision and 24 hour management of active medical problems listed below. Physiatrist and rehab team continue to assess barriers to  discharge/monitor patient progress toward functional and medical goals. FIM: Function - Bathing Position: Shower Body parts bathed by patient: Right arm, Chest, Abdomen, Front perineal area, Right upper leg, Left upper leg, Right lower leg, Left lower leg Body parts bathed by helper: Back, Left arm, Buttocks Assist Level:  (Max A)  Function- Upper Body Dressing/Undressing What is the patient wearing?: Pull over shirt/dress Pull over shirt/dress - Perfomed by patient: Put head through opening, Thread/unthread left sleeve Pull over shirt/dress - Perfomed by helper: Thread/unthread right sleeve, Pull shirt over trunk Assist Level: Touching or steadying assistance(Pt > 75%) (1/4, 25%, max assist) Function - Lower Body Dressing/Undressing What is the patient  wearing?: Underwear, Pants, Non-skid slipper socks, Ted Hose Position: Wheelchair/chair at [employer] - Performed by patient: Thread/unthread left underwear leg, Pull underwear up/down Underwear - Performed by helper: Thread/unthread right underwear leg Pants- Performed by patient: Thread/unthread right pants leg, Thread/unthread left pants leg, Pull pants up/down Pants- Performed by helper: Thread/unthread right pants leg, Pull pants up/down Non-skid slipper socks- Performed by patient: Don/doff left sock Non-skid slipper socks- Performed by helper: Don/doff right sock TED Hose - Performed by helper: Don/doff right TED hose, Don/doff left TED hose Assist for footwear: Dependant Assist for lower body dressing:  (Total A)  Function - Toileting Toileting activity did not occur: No continent bowel/bladder event Toileting steps completed by patient: Adjust clothing prior to toileting, Performs perineal hygiene Toileting steps completed by helper: Adjust clothing prior to toileting, Adjust clothing after toileting Assist level: Two helpers (per Rona Ravens, NT)  Function - Archivist transfer activity did not occur: Refused Toilet transfer assistive device: Grab bar Assist level to toilet: Moderate assist (Pt 50 - 74%/lift or lower) Assist level from toilet: Moderate assist (Pt 50 - 74%/lift or lower)  Function - Chair/bed transfer Chair/bed transfer method: Ambulatory Chair/bed transfer assist level: Touching or steadying assistance (Pt > 75%) Chair/bed transfer assistive device: Armrests, Walker  Function - Locomotion: Wheelchair Will patient use wheelchair at discharge?: Yes Type: Manual Max wheelchair distance: 18ft Assist Level: Supervision or verbal cues Assist Level: Supervision or verbal cues Turns around,maneuvers to table,bed, and toilet,negotiates 3% grade,maneuvers on rugs and over doorsills: No Function - Locomotion: Ambulation Assistive device:  Walker-rolling Max distance: 150 Assist level: Touching or steadying assistance (Pt > 75%) Assist level: Touching or steadying assistance (Pt > 75%) Assist level: Touching or steadying assistance (Pt > 75%) Walk 10 feet on uneven surfaces activity did not occur: Safety/medical concerns  Function - Comprehension Comprehension: Auditory Comprehension assist level: Understands basic 90% of the time/cues < 10% of the time  Function - Expression Expression: Verbal Expression assist level: Expresses basic 50 - 74% of the time/requires cueing 25 - 49% of the time. Needs to repeat parts of sentences.  Function - Social Interaction Social Interaction assist level: Interacts appropriately 50 - 74% of the time - May be physically or verbally inappropriate.  Function - Problem Solving Problem solving assist level: Solves basic 50 - 74% of the time/requires cueing 25 - 49% of the time  Function - Memory Memory assist level: Recognizes or recalls 50 - 74% of the time/requires cueing 25 - 49% of the time Patient normally able to recall (first 3 days only): Current season, That he or she is in a hospital  Medical Problem List and Plan: 1.  Right hemiparesis, aphasia, dysphagia dysarthria secondary to left MCA infarct  Cont CIR PT, OT SLP 2.  DVT Prophylaxis/Anticoagulation: Lovenox 40 mg daily  3. Pain Management: Tylenol as needed 4. Mood: Celexa 40 mg daily 5. Neuropsych: This patient is capable of making decisions on he is own behalf. 6. Skin/Wound Care: Routine skin checks 7. Fluids/Electrolytes/Nutrition:               Dysphagia 1 thin, advance as tolerated 8. Acute on chronic combined congestive heart failure. Continue Lasix. Monitor for any signs of fluid overload.  -increase lasix to 80mg  TID for 2 days then back to bid on Monday, weights now trending down  -BMP tomorrow Filed Weights   02/15/16 0612 02/16/16 0453 02/17/16 0547  Weight: 97.2 kg (214 lb 4.6 oz) 95.1 kg (209 lb 10.5 oz)  94.8 kg (209 lb)  9. Hypertension. Cardura 4 mg daily, Imdur 30 mg daily, Coreg 50 mg twice a day, lisinopril 20 mg daily. Monitor with increased mobility  Vitals:   02/16/16 2120 02/17/16 0547  BP: (!) 167/93 (!) 157/92  Pulse:  65  Resp:  17  Temp:  98.9 F (37.2 C)   Remains elevated, will consider med adjustments after completion of increased dose of Lasix 10. Diabetes mellitus with peripheral neuropathy. Hemoglobin A1c 6.4. Lantus insulin 5 units daily. Check blood sugars before meals and at bedtime. Diabetic teaching   CBGs controlled 1/14  CBG (last 3)   Recent Labs  02/16/16 1628 02/16/16 2059 02/17/16 0635  GLUCAP 120* 117* 79    11. Ulcerative colitis. Sulfasalazine 2000 mg twice a day 12. BPH. Flomax 0.4 mg daily at bedtime. Check PVR 3 13. Hyperlipidemia. Lipitor 14. CRI stage III. Creatinine 1.50-1.58, remains, avoid NSAIDs and Metformin, proteinuria noted  BMP ordered for tomorrow 15. LE edema/Lymphedema: Cont diuretics and compression. Hypoalbuminemia is contributing--added prostat  LOS (Days) 6 A FACE TO FACE EVALUATION WAS PERFORMED  Paige Monarrez Karis Juba 02/17/2016, 7:08 AM

## 2016-02-17 NOTE — Progress Notes (Signed)
Pt. States he will notify when he is ready for assistance with placing on his cpap.

## 2016-02-17 NOTE — Progress Notes (Signed)
Physical Therapy Session Note  Patient Details  Name: [name] MRN: [id number] Date of Birth: [birth year]  Today's Date: 02/17/2016 PT Individual Time: 1436-1530 PT Individual Time Calculation (min): 54 min    Short Term Goals:Week 1:  PT Short Term Goal 1 (Week 1): Patient will transfer bed <> wheelchair with min A using LRAD. PT Short Term Goal 2 (Week 1): Patient will ambulate 150 ft using LRAD with min A.  PT Short Term Goal 3 (Week 1): Patient will negotiate up/down 12 stairs using 2 rails with min A for strengthening.  PT Short Term Goal 4 (Week 1): Patient will maintain dynamic standing balance x 2 min with min A.   Skilled Therapeutic Interventions/Progress Updates:  Treatment 1: Pt received in room with RN present administering meds. Pt became nauseous & began vomiting. Pt missed 30 minutes of skilled PT treatment. Will attempt to see pt for scheduled PT session this afternoon.   Treatment 2: Pt received in w/c & agreeable to tx, denying c/o pain. Pt reported need to use restroom and ambulated w/c<>toilet with RW & min assist. Pt required min assist for sit<>stand on elevated toilet over commode with cuing for hand placement and assistance with clothing management. Pt with continent void & BM on toilet with therapist providing total assist for peri-hygiene for time management. Pt performed hand washing from w/c level with extra time. Gait training in controlled environment with RW & min assist x 270 ft; pt with adequate weight shifting L/R but with poor foot clearance, at times even sliding RLE on floor to advance extremity. Pt appears to be limited by RLE edema & decreased dorsiflexion. After ambulation pt's vitals were: BP = 165/96 mmHg, HR = 62 bpm, SpO2 = 96%. RN notified & cleared pt to continue to participate in therapy. Remainder of session focused on forced use of RUE for neuro re-ed with pt stacking cups with extra time. Pt with poor fine motor use of R hand. At end of session  pt left sitting in w/c in room with all needs within reach & QRB donned.      Therapy Documentation Precautions:  Precautions Precautions: Fall Precaution Comments: right sided weakness Restrictions Weight Bearing Restrictions: No   General: PT Amount of Missed Time (min): 30 Minutes PT Missed Treatment Reason: Patient ill (Comment) (nausea/vomiting)   See Function Navigator for Current Functional Status.   Therapy/Group: Individual Therapy  Sandi Mariscal 02/17/2016, 5:19 PM

## 2016-02-18 ENCOUNTER — Inpatient Hospital Stay (HOSPITAL_COMMUNITY): Payer: Medicare Other

## 2016-02-18 ENCOUNTER — Inpatient Hospital Stay (HOSPITAL_COMMUNITY): Payer: Medicare Other | Admitting: Occupational Therapy

## 2016-02-18 ENCOUNTER — Inpatient Hospital Stay (HOSPITAL_COMMUNITY): Payer: Medicare Other | Admitting: Speech Pathology

## 2016-02-18 LAB — BASIC METABOLIC PANEL
Anion gap: 9 (ref 5–15)
BUN: 21 mg/dL — ABNORMAL HIGH (ref 6–20)
CALCIUM: 8.9 mg/dL (ref 8.9–10.3)
CO2: 30 mmol/L (ref 22–32)
CREATININE: 1.41 mg/dL — AB (ref 0.61–1.24)
Chloride: 102 mmol/L (ref 101–111)
GFR, EST NON AFRICAN AMERICAN: 55 mL/min — AB (ref >60)
Glucose, Bld: 87 mg/dL (ref 65–99)
Potassium: 3.9 mmol/L (ref 3.5–5.1)
SODIUM: 141 mmol/L (ref 135–145)

## 2016-02-18 LAB — GLUCOSE, CAPILLARY
GLUCOSE-CAPILLARY: 90 mg/dL (ref 65–99)
GLUCOSE-CAPILLARY: 105 mg/dL — AB (ref 65–99)
Glucose-Capillary: 122 mg/dL — ABNORMAL HIGH (ref 65–99)
Glucose-Capillary: 144 mg/dL — ABNORMAL HIGH (ref 65–99)

## 2016-02-18 MED ORDER — POTASSIUM CHLORIDE CRYS ER 20 MEQ PO TBCR
40.0000 meq | EXTENDED_RELEASE_TABLET | Freq: Every day | ORAL | Status: DC
  Administered 2016-02-19 – 2016-02-22 (×4): 40 meq via ORAL
  Filled 2016-02-18 (×5): qty 2

## 2016-02-18 MED ORDER — PANTOPRAZOLE SODIUM 40 MG PO TBEC
40.0000 mg | DELAYED_RELEASE_TABLET | Freq: Every day | ORAL | Status: DC
  Administered 2016-02-19 – 2016-02-27 (×9): 40 mg via ORAL
  Filled 2016-02-18 (×9): qty 1

## 2016-02-18 NOTE — Progress Notes (Addendum)
Physical Therapy Note  Patient Details  Name: [name] MRN: [id number] Date of Birth: 05-Nov-1960 Today's Date: 02/18/2016  1030-1100, 30 min individual tx; 1510-1610, 60 min individual tx Pain: none reported AM or PM  tx 1:  W/c propulsion using bil LEs x 50' iwht supervision and extra time. Gait using RW x 150' including turns with min assist, mod cues for keeping R foot within RW.  neuromuscular re-education via demo for seated 15 x 1 each: R knee extension with ankle DF at end range, working on smoothness of movement and eccentric control, and bil ankle DF.  Pt left resting in w/c with quick release belt donned and all needs within reach.  tx 2:  neuromuscular re-education via forced use, VCs for w/ c propulsion using bil UEs , R wheel rim wrapped with Theraband, x 100' with min assist and several rest breaks. Seated R hand grasp/release of cups ton tray table, to stack, arrange in pyramid shape, slowly and accurately .  Sustained strectch bil heel cords in in sitting and standing on wedge.  Up/down 12 steps 2 rails with mod cues for sequencing, step through ascending 3" high steps, step to for 6" steps and to descend 3" high steps. Sit>< stand using Bobath method to load bil LEs, x 3.  Gait with Rw on level tile x 150' with min guard, min cues for keeping R foot within RW.  Pt left resting in w/c with quick release belt donned and all need within reach.  See function navigator for current status.  Bernyce Brimley 02/18/2016, 10:53 AM

## 2016-02-18 NOTE — Progress Notes (Signed)
Speech Language Pathology Daily Session Note  Patient Details  Name: [name] MRN: [id number] Date of Birth: 08-26-1960  Today's Date: 02/18/2016 SLP Individual Time: 0904-1000 SLP Individual Time Calculation (min): 56 min   Short Term Goals:Week 1: SLP Short Term Goal 1 (Week 1): Pt will consume dys 1 textures and thin liquids with supervision verbal cues for use of swallowing precautions and minimal overt s/s of aspiration.   SLP Short Term Goal 2 (Week 1): Pt will consume therapeutic trials of dys 2 textures with min verbal cues for use of swallowing precautions over 2 consecutive sessions prior to advancement.   SLP Short Term Goal 3 (Week 1): Pt will visually scan to the right of midline during basic functional tasks in >75% of opportunities wtih supervision verbal cues.   SLP Short Term Goal 4 (Week 1): Pt will recognize and correct verbal errors in the moment during phrase/sentence level expression with min assist verbal cues.  SLP Short Term Goal 5 (Week 1): Pt will utilize increased vocal intensity and overarticulation to achieve intelligibility at the sentence level with min verbal cues.   SLP Short Term Goal 6 (Week 1): Pt will utilize compensatory word finding strategies at the phrase/sentence level with min assist verbal cues.    Skilled Therapeutic Interventions: Pt was seen for skilled ST targeting goals for dysphagia and communication.  SLP facilitated the session with trials of pills whole in puree during morning med administration.  Pt needed mod assist verbal cues to clear pill from oral cavity due to oral holding in the setting of decreased attention to boluses.  Alternating pills in puree with liquid wash was helpful in minimizing delay in swallow response.  Recommend advancing pt to meds whole in puree.  Pt was able to complete oral care after meds with min assist verbal cues for sequencing and initiation.  Therapist also facilitated the session with a categorical naming  task targeting word finding and speech intelligibility.  Pt needed min-mod assist verbal cues to increase vocal intensity and overarticulate to achieve intelligibility, min assist verbal cues needed for word finding.  Pt was left in chair with quick release belt donned for safety and call bell left within reach.  Continue per current plan of care.       Function:  Eating Eating                 Cognition Comprehension Comprehension assist level: Understands basic 90% of the time/cues < 10% of the time  Expression   Expression assist level: Expresses basic 75 - 89% of the time/requires cueing 10 - 24% of the time. Needs helper to occlude trach/needs to repeat words.  Social Interaction Social Interaction assist level: Interacts appropriately 50 - 74% of the time - May be physically or verbally inappropriate.  Problem Solving Problem solving assist level: Solves basic 50 - 74% of the time/requires cueing 25 - 49% of the time  Memory Memory assist level: Recognizes or recalls 50 - 74% of the time/requires cueing 25 - 49% of the time    Pain Pain Assessment Pain Assessment: No/denies pain Pain Score: 0-No pain  Therapy/Group: Individual Therapy  Calistro Rauf, Melanee Spry 02/18/2016, 10:01 AM

## 2016-02-18 NOTE — Progress Notes (Signed)
Social Work Patient ID: [name], male   DOB: [birth year], 56 y.o.   MRN: [id number]  Spoke with pt's brother via telephone to discuss pt's progress and next level of care he will need. Brother to get a list of NH's for this worker to pursue. He would like the [employer] [facility] forms completed by MD. Have given them to Dan-PA to complete. Will begin to work on NHP for  Pt after rehab.

## 2016-02-18 NOTE — Progress Notes (Signed)
Occupational Therapy Session Note  Patient Details  Name: [name] MRN: [id number] Date of Birth: September 26, 1960  Today's Date: 02/18/2016 OT Individual Time: 0800-0902 OT Individual Time Calculation (min): 62 min   Skilled Therapeutic Interventions/Progress Updates:    Pt completed bathing and dressing sit to stand at the sink secondary to lymphedema wrapping on the RLE.  He needed mod assist for washing the LUE with the right hand.  Min assist for sit to stand to remove and donn underpants and pants over hips.  Mod assist for threading both over the RLE this session.  Max demonstrational cueing with min assist to [employer].  Pt still with motor planning deficits making it difficult to orient and to begin donning shirt with the LUE.  Pt needed mod assist with donning the left sock as well to end session.  Pt left in wheelchair with call button and phone in reach and safety belt in place.    Therapy Documentation Precautions:  Precautions Precautions: Fall Precaution Comments: right sided weakness Restrictions Weight Bearing Restrictions: No  Pain: Pain Assessment Pain Assessment: No/denies pain Pain Score: 0-No pain ADL: ADL ADL Comments: Please see functional navigator  See Function Navigator for Current Functional Status.   Therapy/Group: Individual Therapy  Raheel Kunkle OTR/L 02/18/2016, 12:13 PM

## 2016-02-18 NOTE — Progress Notes (Signed)
Removed CPAP to administer medications. Pt refused to wear CPAP afterwards.  Harlow Asa, RN 02/18/2016 10:31 PM

## 2016-02-18 NOTE — Progress Notes (Addendum)
Subjective/Complaints:  No problems during ADL program  ROS: Denies CP, SOB, N/V/D.  Objective: Vital Signs: Blood pressure (!) 168/92, pulse 63, temperature 98 F (36.7 C), temperature source Oral, resp. rate 18, height 5' 11"  (1.803 m), weight 95.4 kg (210 lb 5.1 oz), SpO2 97 %. No results found. Results for orders placed or performed during the hospital encounter of 02/11/16 (from the past 72 hour(s))  Glucose, capillary     Status: Abnormal   Collection Time: 02/15/16 11:35 AM  Result Value Ref Range   Glucose-Capillary 108 (H) 65 - 99 mg/dL  Glucose, capillary     Status: Abnormal   Collection Time: 02/15/16  4:21 PM  Result Value Ref Range   Glucose-Capillary 109 (H) 65 - 99 mg/dL  Glucose, capillary     Status: Abnormal   Collection Time: 02/15/16  8:55 PM  Result Value Ref Range   Glucose-Capillary 121 (H) 65 - 99 mg/dL   Comment 1 Notify RN   Glucose, capillary     Status: None   Collection Time: 02/16/16  6:34 AM  Result Value Ref Range   Glucose-Capillary 82 65 - 99 mg/dL   Comment 1 Notify RN   Glucose, capillary     Status: Abnormal   Collection Time: 02/16/16 11:30 AM  Result Value Ref Range   Glucose-Capillary 112 (H) 65 - 99 mg/dL  Glucose, capillary     Status: Abnormal   Collection Time: 02/16/16  4:28 PM  Result Value Ref Range   Glucose-Capillary 120 (H) 65 - 99 mg/dL  Glucose, capillary     Status: Abnormal   Collection Time: 02/16/16  8:59 PM  Result Value Ref Range   Glucose-Capillary 117 (H) 65 - 99 mg/dL   Comment 1 Notify RN   Glucose, capillary     Status: None   Collection Time: 02/17/16  6:35 AM  Result Value Ref Range   Glucose-Capillary 79 65 - 99 mg/dL   Comment 1 Notify RN   Glucose, capillary     Status: Abnormal   Collection Time: 02/17/16 11:32 AM  Result Value Ref Range   Glucose-Capillary 100 (H) 65 - 99 mg/dL  Glucose, capillary     Status: Abnormal   Collection Time: 02/17/16  4:26 PM  Result Value Ref Range   Glucose-Capillary 139 (H) 65 - 99 mg/dL  Glucose, capillary     Status: Abnormal   Collection Time: 02/17/16  8:34 PM  Result Value Ref Range   Glucose-Capillary 122 (H) 65 - 99 mg/dL  Basic metabolic panel     Status: Abnormal   Collection Time: 02/18/16  3:54 AM  Result Value Ref Range   Sodium 141 135 - 145 mmol/L   Potassium 3.9 3.5 - 5.1 mmol/L   Chloride 102 101 - 111 mmol/L   CO2 30 22 - 32 mmol/L   Glucose, Bld 87 65 - 99 mg/dL   BUN 21 (H) 6 - 20 mg/dL   Creatinine, Ser 1.41 (H) 0.61 - 1.24 mg/dL   Calcium 8.9 8.9 - 10.3 mg/dL   GFR calc non Af Amer 55 (L) >60 mL/min   GFR calc Af Amer >60 >60 mL/min    Comment: (NOTE) The eGFR has been calculated using the CKD EPI equation. This calculation has not been validated in all clinical situations. eGFR's persistently <60 mL/min signify possible Chronic Kidney Disease.    Anion gap 9 5 - 15  Glucose, capillary     Status: None   Collection  Time: 02/18/16  6:37 AM  Result Value Ref Range   Glucose-Capillary 90 65 - 99 mg/dL     HEENT: Normocephaic, atraumatic Cardio: RRR. No JVD Resp: clear without wheezes. Unlabored. GI: BS positive and NT, ND Skin:   Intact. Warm and dry.  Neuro: alert.   Flat Motor: 4/5 LUE proximal to distal LLE: 4/5 proxima to distal  RUE: 4-/5 proximal to distal RLE: 4-/5 HF KE ADF Dysarthric speech  Musc/Skel:  No tenderness. Right foot Edema Gen NAD. Vital signs reviewed.    Assessment/Plan: 1. Functional deficits secondary to Left MCA infarct with aphasia, Right hemiparesis, dysarthria which require 3+ hours per day of interdisciplinary therapy in a comprehensive inpatient rehab setting. Physiatrist is providing close team supervision and 24 hour management of active medical problems listed below. Physiatrist and rehab team continue to assess barriers to discharge/monitor patient progress toward functional and medical goals. FIM: Function - Bathing Position: Wheelchair/chair at sink Body  parts bathed by patient: Right arm, Chest, Abdomen, Front perineal area, Right upper leg, Left upper leg, Right lower leg, Left lower leg Body parts bathed by helper: Back, Left arm, Buttocks Assist Level:  (Mod A)  Function- Upper Body Dressing/Undressing What is the patient wearing?: Pull over shirt/dress Pull over shirt/dress - Perfomed by patient: Put head through opening, Thread/unthread left sleeve Pull over shirt/dress - Perfomed by helper: Thread/unthread right sleeve, Pull shirt over trunk Assist Level: Touching or steadying assistance(Pt > 75%) Function - Lower Body Dressing/Undressing What is the patient wearing?: Underwear, Pants, Non-skid slipper socks, Ted Hose Position: Wheelchair/chair at [employer] - Performed by patient: Thread/unthread left underwear leg Underwear - Performed by helper: Pull underwear up/down, Thread/unthread right underwear leg Pants- Performed by patient: Thread/unthread left pants leg Pants- Performed by helper: Thread/unthread right pants leg, Pull pants up/down Non-skid slipper socks- Performed by patient: Don/doff left sock Non-skid slipper socks- Performed by helper: Don/doff right sock TED Hose - Performed by helper: Don/doff right TED hose, Don/doff left TED hose Assist for footwear: Dependant Assist for lower body dressing:  (Total A)  Function - Toileting Toileting activity did not occur: No continent bowel/bladder event Toileting steps completed by patient: Adjust clothing prior to toileting Toileting steps completed by helper: Adjust clothing prior to toileting, Performs perineal hygiene, Adjust clothing after toileting Toileting Assistive Devices: Grab bar or rail, Other (comment) Assist level: Touching or steadying assistance (Pt.75%)  Function - Air cabin crew transfer activity did not occur: Refused Toilet transfer assistive device: Elevated toilet seat/BSC over toilet, Walker Assist level to toilet: Touching or  steadying assistance (Pt > 75%) Assist level from toilet: Touching or steadying assistance (Pt > 75%)  Function - Chair/bed transfer Chair/bed transfer method: Ambulatory Chair/bed transfer assist level: Touching or steadying assistance (Pt > 75%) Chair/bed transfer assistive device: Armrests, Walker  Function - Locomotion: Wheelchair Will patient use wheelchair at discharge?: Yes Type: Manual Max wheelchair distance: 124f Assist Level: Supervision or verbal cues Assist Level: Supervision or verbal cues Turns around,maneuvers to table,bed, and toilet,negotiates 3% grade,maneuvers on rugs and over doorsills: No Function - Locomotion: Ambulation Assistive device: Walker-rolling Max distance: 80 Assist level: Touching or steadying assistance (Pt > 75%) Assist level: Touching or steadying assistance (Pt > 75%) Assist level: Touching or steadying assistance (Pt > 75%) Assist level: Touching or steadying assistance (Pt > 75%) Walk 10 feet on uneven surfaces activity did not occur: Safety/medical concerns  Function - Comprehension Comprehension: Auditory Comprehension assist level: Understands basic 90% of the time/cues <  10% of the time  Function - Expression Expression: Verbal Expression assist level: Expresses basic 50 - 74% of the time/requires cueing 25 - 49% of the time. Needs to repeat parts of sentences.  Function - Social Interaction Social Interaction assist level: Interacts appropriately 50 - 74% of the time - May be physically or verbally inappropriate.  Function - Problem Solving Problem solving assist level: Solves basic 50 - 74% of the time/requires cueing 25 - 49% of the time  Function - Memory Memory assist level: Recognizes or recalls 50 - 74% of the time/requires cueing 25 - 49% of the time Patient normally able to recall (first 3 days only): Current season, That he or she is in a hospital  Medical Problem List and Plan: 1.  Right hemiparesis, aphasia,  dysphagia dysarthria secondary to left MCA infarct  Cont CIR PT, OT SLP 2.  DVT Prophylaxis/Anticoagulation: Lovenox 40 mg daily 3. Pain Management: Tylenol as needed, left shoulder pain improving 4. Mood: Celexa 40 mg daily 5. Neuropsych: This patient is capable of making decisions on he is own behalf. 6. Skin/Wound Care: Routine skin checks 7. Fluids/Electrolytes/Nutrition:               Dysphagia 1 thin, advance as tolerated 8. Acute on chronic combined congestive heart failure. Continue Lasix. Monitor for any signs of fluid overload.  -RLE swelling chronic lymphedema not CHF related  -BMP reviewed, no worsening of renal fxn on increased diuretic Filed Weights   02/16/16 0453 02/17/16 0547 02/18/16 0600  Weight: 95.1 kg (209 lb 10.5 oz) 94.8 kg (209 lb) 95.4 kg (210 lb 5.1 oz)  9. Hypertension. Cardura 4 mg daily, Imdur 30 mg daily, Coreg 50 mg twice a day, lisinopril 20 mg daily. Monitor with increased mobility, at home also took Amlodipine, apresoline, aldactone.  Will restart low dose amlodipine  Vitals:   02/18/16 0559 02/18/16 0600  BP: (!) 182/92 (!) 168/92  Pulse: 63   Resp: 18   Temp: 98 F (36.7 C)     10. Diabetes mellitus with peripheral neuropathy. Hemoglobin A1c 6.4. Lantus insulin 5 units daily. Check blood sugars before meals and at bedtime. Diabetic teaching   CBGs controlled 1/15  CBG (last 3)   Recent Labs  02/17/16 1626 02/17/16 2034 02/18/16 0637  GLUCAP 139* 122* 90    11. Ulcerative colitis. Sulfasalazine 2000 mg twice a day 12. BPH. Flomax 0.4 mg daily at bedtime. Check PVR 3 13. Hyperlipidemia. Lipitor 14. CRI stage III. Creatinine 1.50-1.58, remains, avoid NSAIDs and Metformin, proteinuria noted  BMP stable 15. LE edema/Lymphedema: Cont diuretics and compression. Hypoalbuminemia is contributing--added prostat  LOS (Days) 7 A FACE TO FACE EVALUATION WAS PERFORMED  KIRSTEINS,ANDREW E 02/18/2016, 8:34 AM

## 2016-02-19 ENCOUNTER — Inpatient Hospital Stay (HOSPITAL_COMMUNITY): Payer: Medicare Other | Admitting: Occupational Therapy

## 2016-02-19 ENCOUNTER — Inpatient Hospital Stay (HOSPITAL_COMMUNITY): Payer: Medicare Other | Admitting: Speech Pathology

## 2016-02-19 ENCOUNTER — Inpatient Hospital Stay (HOSPITAL_COMMUNITY): Payer: Medicare Other | Admitting: Physical Therapy

## 2016-02-19 LAB — GLUCOSE, CAPILLARY
GLUCOSE-CAPILLARY: 90 mg/dL (ref 65–99)
GLUCOSE-CAPILLARY: 150 mg/dL — AB (ref 65–99)
Glucose-Capillary: 65 mg/dL (ref 65–99)
Glucose-Capillary: 125 mg/dL — ABNORMAL HIGH (ref 65–99)
Glucose-Capillary: 118 mg/dL — ABNORMAL HIGH (ref 65–99)

## 2016-02-19 MED ORDER — AMLODIPINE BESYLATE 5 MG PO TABS
5.0000 mg | ORAL_TABLET | Freq: Every day | ORAL | Status: DC
  Administered 2016-02-19 – 2016-02-27 (×9): 5 mg via ORAL
  Filled 2016-02-19 (×9): qty 1

## 2016-02-19 NOTE — Progress Notes (Signed)
Physical Therapy Session Note  Patient Details  Name: [name] MRN: [id number] Date of Birth: 14-Nov-1960  Today's Date: 02/19/2016 PT Individual Time: 3291-9166 PT Individual Time Calculation (min): 75 min    Short Term Goals: Week 2:  PT Short Term Goal 1 (Week 2): STG = LTG's due to ELOS.  Skilled Therapeutic Interventions/Progress Updates:    Pt received asleep in w/c but easily awakened & agreeable to tx. Pt denied c/o pain. Session focused on RLE neuro re-ed, gait training, stair negotiation, and RLE strengthening. Pt ambulated 150 ft + 200 ft with RW & steady assist. Pt with difficulty maintaining hand hold on RW therefore provided him with R hand orthosis. Pt requires assistance from therapist for increased weight shifting L and cuing for RLE foot clearance; pt with minimal REL dorsiflexion, possibly limited by lymphadema. High level balance training completed with pt ambulating without AD x 10 ft + 60 ft + 60 ft with min assist for weight shifting & overall balance. Stair negotiation x 24 steps (6" + 3") with B rails, min cuing for attention to right hand, and steady assist/close supervision for balance with task focusing on RLE strengthening. Pt utilized cybex kinetron progressing from sitting>standing with BUE support>standing without UE support and min/mod assist. Task focused on weight shifting L/R and RLE neuro re-ed. Pt sorted plastic shapes with RUE with task focusing on forced use of RUE and problem solving with min cuing. At end of session pt left in w/c in room with QRB donned, all needs within reach, & NT present.  Therapy Documentation Precautions:  Precautions Precautions: Fall Precaution Comments: right sided weakness Restrictions Weight Bearing Restrictions: No   See Function Navigator for Current Functional Status.   Therapy/Group: Individual Therapy  Sandi Mariscal 02/19/2016, 5:08 PM

## 2016-02-19 NOTE — Progress Notes (Addendum)
Physical Therapy Weekly Progress Note  Patient Details  Name: [name] MRN: [id number] Date of Birth: 03-08-1960  Beginning of progress report period: February 12, 2016 End of progress report period: February 19, 2016  Today's Date: 02/19/2016  Patient has met 4 of 4 short term goals.  Pt is making progress towards LTG's. Pt continues to require Min assist for functional mobility due to impaired balance and impaired neuro muscular control of RLE. Pt also limited by edema in RLE and is receiving lymphadema wrappings. Pt would benefit from continued therapy to progress gait training, stair negotiation, balance, and safety awareness.  Patient continues to demonstrate the following deficits muscle weakness, decreased endurance, decreased coordination, decreased awareness, and decreased safety awareness and decreased standing balance, decreased postural control, hemiplegia and decreased balance strategies and therefore will continue to benefit from skilled PT intervention to increase functional independence with mobility.  Patient progressing toward long term goals..  Continue plan of care.  PT Short Term Goals Week 1:  PT Short Term Goal 1 (Week 1): Patient will transfer bed <> wheelchair with min A using LRAD. PT Short Term Goal 1 - Progress (Week 1): Met PT Short Term Goal 2 (Week 1): Patient will ambulate 150 ft using LRAD with min A.  PT Short Term Goal 2 - Progress (Week 1): Met PT Short Term Goal 3 (Week 1): Patient will negotiate up/down 12 stairs using 2 rails with min A for strengthening.  PT Short Term Goal 3 - Progress (Week 1): Met PT Short Term Goal 4 (Week 1): Patient will maintain dynamic standing balance x 2 min with min A. PT Short Term Goal 4 - Progress (Week 1): Met Week 2:  PT Short Term Goal 1 (Week 2): STG = LTG's due to ELOS.    Precautions:  Precautions Precautions: Fall Precaution Comments: right sided weakness Restrictions Weight Bearing Restrictions:  No   See Function Navigator for Current Functional Status.    Waunita Schooner 02/19/2016, 4:30 PM

## 2016-02-19 NOTE — Progress Notes (Signed)
Occupational Therapy Weekly Progress Note  Patient Details  Name: [name] MRN: [id number] Date of Birth: Jun 22, 1960  Beginning of progress report period: February 12, 2016 End of progress report period: February 19, 2015  Today's Date: 02/19/2016 OT Individual Time: 5056-9794 OT Individual Time Calculation (min): 51 min     Patient has met 4 of 4 short term goals.  [name] has made great progress with OT up to this time.  He is able to complete bathing, toileting, and walk-in shower transfers with min assist at this time.  Mod assist is still needed for UB and LB dressing secondary to motor planning and RUE weakness.  He uses the RUE as a gross assist during bathing tasks but needs min to mod assist to incorporate into dressing tasks.  He still demonstrates decreased digit extension as well as decreased elbow extension with functional reach.  Decreased ability to sequence and orient clothing is still present secondary to perceptual issues.  Mod assist is needed for simple tasks such as starting his pullover shirt secondary to motor planning.  Feel he is on target to meet established goals with the exception of dressing goals, which will be downgraded.  Will continue with current OT treatment POC with expected discharge to SNF as pt does not have supervision at home.    Patient continues to demonstrate the following deficits: muscle weakness, impaired timing and sequencing, abnormal tone, unbalanced muscle activation, motor apraxia, decreased coordination and decreased motor planning, decreased motor planning and ideational apraxia, decreased problem solving, decreased memory and delayed processing and decreased standing balance, hemiplegia and decreased balance strategies and therefore will continue to benefit from skilled OT intervention to enhance overall performance with BADL.  Patient progressing toward long term goals..  Continue plan of care.  OT Short Term Goals Week 2:  OT Short  Term Goal 1 (Week 2): Pt will continue working toward established supervision level goals for discharge.     Skilled Therapeutic Interventions/Progress Updates:    Pt completed washing his face with setup of washcloth to begin session.  Next rolled pt down to the therapy gym for work on RUE neuromuscular re-education.  He worked in quadriped to start with emphasis on weightbearing over the RUE while completing reaching tasks with the LUE.  Mod assist needed to stabilize the right hand and elbow.  He was able to then transition to supine for work on BUE shoulder flexion with use of a ball and a dowel rod.  Mod assist needed to keep the RUE in contact with the ball while maintaining elbow extension.  Only min assist needed for maintaining elbow extension and grip with use of the dowel rod.  Progressed to sitting as well with mod assist to complete shoulder flexion bilaterally to 90 degrees.  Finished gym work by having pt reach with the RUE to pick up cups at knee level and place in therapist's hand to the right.  Occasional min assist needed for this as pt was unable to demonstrate full digit extension to pick up the cup.  Issued foam piece in cup for pt to work on picking up and placing in his room.    Therapy Documentation Precautions:  Precautions Precautions: Fall Precaution Comments: right sided weakness Restrictions Weight Bearing Restrictions: No  Pain: Pain Assessment Pain Assessment: No/denies pain ADL: See Function Navigator for Current Functional Status.   Therapy/Group: Individual Therapy  [name] OTR/L 02/19/2016, 2:07 PM

## 2016-02-19 NOTE — Progress Notes (Signed)
Speech Language Pathology Weekly Progress and Session Note  Patient Details  Name: [name] MRN: [id number] Date of Birth: 1960-04-18  Beginning of progress report period: February 12, 2016  End of progress report period:  February 19, 2016   Today's Date: 02/19/2016 SLP Individual Time: 0905-1000 SLP Individual Time Calculation (min): 55 min   Short Term Goals: Week 1: SLP Short Term Goal 1 (Week 1): Pt will consume dys 1 textures and thin liquids with supervision verbal cues for use of swallowing precautions and minimal overt s/s of aspiration.   SLP Short Term Goal 1 - Progress (Week 1): Progressing toward goal SLP Short Term Goal 2 (Week 1): Pt will consume therapeutic trials of dys 2 textures with min verbal cues for use of swallowing precautions over 2 consecutive sessions prior to advancement.   SLP Short Term Goal 2 - Progress (Week 1): Progressing toward goal SLP Short Term Goal 3 (Week 1): Pt will visually scan to the right of midline during basic functional tasks in >75% of opportunities wtih supervision verbal cues.   SLP Short Term Goal 3 - Progress (Week 1): Met SLP Short Term Goal 4 (Week 1): Pt will recognize and correct verbal errors in the moment during phrase/sentence level expression with min assist verbal cues.  SLP Short Term Goal 4 - Progress (Week 1): Met SLP Short Term Goal 5 (Week 1): Pt will utilize increased vocal intensity and overarticulation to achieve intelligibility at the sentence level with min verbal cues.   SLP Short Term Goal 5 - Progress (Week 1): Progressing toward goal SLP Short Term Goal 6 (Week 1): Pt will utilize compensatory word finding strategies at the phrase/sentence level with min assist verbal cues.   SLP Short Term Goal 6 - Progress (Week 1): Met    New Short Term Goals: Week 2: SLP Short Term Goal 1 (Week 2): Pt will consume dys 1 textures and thin liquids with supervision verbal cues for use of swallowing precautions and minimal  overt s/s of aspiration.   SLP Short Term Goal 2 (Week 2): Pt will consume therapeutic trials of dys 2 textures with min verbal cues for use of swallowing precautions over 2 consecutive sessions prior to advancement.   SLP Short Term Goal 3 (Week 2): Pt will visually scan to the right of midline during basic functional tasks in >75% of opportunities with mod I.   SLP Short Term Goal 4 (Week 2): Pt will recognize and correct verbal errors in the moment during phrase/sentence level expression with supervision verbal cues.  SLP Short Term Goal 5 (Week 2): Pt will utilize increased vocal intensity and overarticulation to achieve intelligibility at the sentence level with min verbal cues.   SLP Short Term Goal 6 (Week 2): Pt will utilize compensatory word finding strategies at the phrase/sentence level with supervision verbal cues.    Weekly Progress Updates: Pt has made functional gains this reporting period and has met 3 out of 6 short term goals.  Pt has demonstrated improved attention to boluses and mastication of dys 2 textures during trials with SLP and would benefit from trial meal tray of advanced consistencies at next available appointment prior to diet upgrade.  Pt has also demonstrated improved word finding and awareness of verbal errors during both structured and unstructured tasks with min assist verbal cues, although function is noted to fluctuate given alertness and attention.   Pt would continue to benefit from skilled ST while inpatient in order to maximize functional independence  and reduce burden of care prior to discharge.  No family has been present for education at this time but therapist will continue efforts as they are available to attend therapies.       Intensity: Minumum of 1-2 x/day, 30 to 90 minutes Frequency: 3 to 5 out of 7 days Duration/Length of Stay: 14-17 days  Treatment/Interventions: Cognitive remediation/compensation;Cueing hierarchy;Dysphagia/aspiration precaution  training;Environmental controls;Internal/external aids;Multimodal communication approach;Patient/family education;Functional tasks;Speech/Language facilitation   Daily Session  Skilled Therapeutic Interventions: Pt was seen for skilled ST targeting goals for dysphagia and communication.  SLP facilitated the session with trials of dys 2 textures to continue working towards diet progression.  Pt demonstrated improved attention to boluses which in turn improved the timeliness of pt's mastication of solids.  Pt was able to clear solids from the oral cavity with min assist verbal cues for use of liquid wash.  Pt also took pills during morning medication administration whole with puree with min-mod verbal cues for clearance of boluses from the oral cavity.  SLP also facilitated the session with a structured naming task targeting word finding and speech intelligibility.  Pt was overall min assist for naming of specific members when given a targeted category but needed mod assist verbal cues for overarticulation, increased vocal intensity, and slow rate to achieve intelligibility at the phrase level.  Pt was returned to room and left in wheelchair with call bell within reach and quick release belt donned.  Goals updated on this date to reflect current progress and plan of care.       Function:   Eating Eating   Modified Consistency Diet: Yes Eating Assist Level: Set up assist for;Supervision or verbal cues;Helper checks for pocketed food   Eating Set Up Assist For: Opening containers       Cognition Comprehension Comprehension assist level: Understands basic 90% of the time/cues < 10% of the time  Expression   Expression assist level: Expresses basic 50 - 74% of the time/requires cueing 25 - 49% of the time. Needs to repeat parts of sentences.  Social Interaction Social Interaction assist level: Interacts appropriately 75 - 89% of the time - Needs redirection for appropriate language or to initiate  interaction.  Problem Solving Problem solving assist level: Solves basic 50 - 74% of the time/requires cueing 25 - 49% of the time  Memory Memory assist level: Recognizes or recalls 25 - 49% of the time/requires cueing 50 - 75% of the time   General    Pain Pain Assessment Pain Assessment: No/denies pain  Therapy/Group: Individual Therapy  Erandi Lemma, Selinda Orion 02/19/2016, 12:34 PM

## 2016-02-19 NOTE — Progress Notes (Signed)
Subjective/Complaints:  No issues overnite eating crackers this am no sweats feels ok  ROS: Denies CP, SOB, N/V/D.  Objective: Vital Signs: Blood pressure (!) 161/84, pulse 68, temperature 98.3 F (36.8 C), temperature source Oral, resp. rate 17, height _0  (1.803 m), weight 95.1 kg (209 lb 10.5 oz), SpO2 97 %. No results found. Results for orders placed or performed during the hospital encounter of 02/11/16 (from the past 72 hour(s))  Glucose, capillary     Status: Abnormal   Collection Time: 02/16/16 11:30 AM  Result Value Ref Range   Glucose-Capillary 112 (H) 65 - 99 mg/dL  Glucose, capillary     Status: Abnormal   Collection Time: 02/16/16  4:28 PM  Result Value Ref Range   Glucose-Capillary 120 (H) 65 - 99 mg/dL  Glucose, capillary     Status: Abnormal   Collection Time: 02/16/16  8:59 PM  Result Value Ref Range   Glucose-Capillary 117 (H) 65 - 99 mg/dL   Comment 1 Notify RN   Glucose, capillary     Status: None   Collection Time: 02/17/16  6:35 AM  Result Value Ref Range   Glucose-Capillary 79 65 - 99 mg/dL   Comment 1 Notify RN   Glucose, capillary     Status: Abnormal   Collection Time: 02/17/16 11:32 AM  Result Value Ref Range   Glucose-Capillary 100 (H) 65 - 99 mg/dL  Glucose, capillary     Status: Abnormal   Collection Time: 02/17/16  4:26 PM  Result Value Ref Range   Glucose-Capillary 139 (H) 65 - 99 mg/dL  Glucose, capillary     Status: Abnormal   Collection Time: 02/17/16  8:34 PM  Result Value Ref Range   Glucose-Capillary 122 (H) 65 - 99 mg/dL  Basic metabolic panel     Status: Abnormal   Collection Time: 02/18/16  3:54 AM  Result Value Ref Range   Sodium 141 135 - 145 mmol/L   Potassium 3.9 3.5 - 5.1 mmol/L   Chloride 102 101 - 111 mmol/L   CO2 30 22 - 32 mmol/L   Glucose, Bld 87 65 - 99 mg/dL   BUN 21 (H) 6 - 20 mg/dL   Creatinine, Ser 1.41 (H) 0.61 - 1.24 mg/dL   Calcium 8.9 8.9 - 10.3 mg/dL   GFR calc non Af Amer 55 (L) >60 mL/min   GFR  calc Af Amer >60 >60 mL/min    Comment: (NOTE) The eGFR has been calculated using the CKD EPI equation. This calculation has not been validated in all clinical situations. eGFR's persistently <60 mL/min signify possible Chronic Kidney Disease.    Anion gap 9 5 - 15  Glucose, capillary     Status: None   Collection Time: 02/18/16  6:37 AM  Result Value Ref Range   Glucose-Capillary 90 65 - 99 mg/dL  Glucose, capillary     Status: Abnormal   Collection Time: 02/18/16 11:31 AM  Result Value Ref Range   Glucose-Capillary 105 (H) 65 - 99 mg/dL  Glucose, capillary     Status: Abnormal   Collection Time: 02/18/16  4:44 PM  Result Value Ref Range   Glucose-Capillary 122 (H) 65 - 99 mg/dL  Glucose, capillary     Status: Abnormal   Collection Time: 02/18/16  8:48 PM  Result Value Ref Range   Glucose-Capillary 144 (H) 65 - 99 mg/dL  Glucose, capillary     Status: None   Collection Time: 02/19/16  6:42 AM  Result  Value Ref Range   Glucose-Capillary 65 65 - 99 mg/dL   Comment 1 Notify RN      HEENT: Normocephaic, atraumatic Cardio: RRR. No JVD Resp: clear without wheezes. Unlabored. GI: BS positive and NT, ND Skin:   Intact. Warm and dry.  Neuro: alert.   Flat Motor: 4/5 LUE proximal to distal LLE: 4/5 proxima to distal  RUE: 4-/5 proximal to distal RLE: 4-/5 HF KE ADF Dysarthric speech  Musc/Skel:  No tenderness. Right foot Edema Gen NAD. Vital signs reviewed.    Assessment/Plan: 1. Functional deficits secondary to Left MCA infarct with aphasia, Right hemiparesis, dysarthria which require 3+ hours per day of interdisciplinary therapy in a comprehensive inpatient rehab setting. Physiatrist is providing close team supervision and 24 hour management of active medical problems listed below. Physiatrist and rehab team continue to assess barriers to discharge/monitor patient progress toward functional and medical goals. FIM: Function - Bathing Position: Wheelchair/chair at  sink Body parts bathed by patient: Right arm, Chest, Abdomen, Front perineal area, Right upper leg, Left upper leg, Left lower leg, Buttocks Body parts bathed by helper: Back, Left arm Bathing not applicable: Right lower leg (Pt with lymphedema wrapping) Assist Level:  (Mod A)  Function- Upper Body Dressing/Undressing What is the patient wearing?: Pull over shirt/dress Pull over shirt/dress - Perfomed by patient: Put head through opening, Pull shirt over trunk, Thread/unthread left sleeve Pull over shirt/dress - Perfomed by helper: Thread/unthread right sleeve Assist Level: Touching or steadying assistance(Pt > 75%) Function - Lower Body Dressing/Undressing What is the patient wearing?: Underwear, Pants, Socks Position: Wheelchair/chair at sink Underwear - Performed by patient: Thread/unthread left underwear leg Underwear - Performed by helper: Thread/unthread right underwear leg Pants- Performed by patient: Thread/unthread left pants leg, Pull pants up/down Pants- Performed by helper: Thread/unthread right pants leg Non-skid slipper socks- Performed by patient: Don/doff left sock Non-skid slipper socks- Performed by helper: Don/doff left sock (right sock NA secondary to lymphedema wrapping) TED Hose - Performed by helper: Don/doff right TED hose, Don/doff left TED hose Assist for footwear: Dependant Assist for lower body dressing:  (Total A)  Function - Toileting Toileting activity did not occur: No continent bowel/bladder event Toileting steps completed by patient: Adjust clothing prior to toileting Toileting steps completed by helper: Performs perineal hygiene, Adjust clothing after toileting Toileting Assistive Devices: Grab bar or rail, Other (comment) Assist level: Touching or steadying assistance (Pt.75%)  Function - Air cabin crew transfer activity did not occur: Refused Toilet transfer assistive device: Elevated toilet seat/BSC over toilet, Walker Assist level to  toilet: Touching or steadying assistance (Pt > 75%) Assist level from toilet: Touching or steadying assistance (Pt > 75%)  Function - Chair/bed transfer Chair/bed transfer method: Stand pivot Chair/bed transfer assist level: Supervision or verbal cues Chair/bed transfer assistive device: Armrests, Walker Chair/bed transfer details: Verbal cues for precautions/safety  Function - Locomotion: Wheelchair Will patient use wheelchair at discharge?: Yes Type: Manual Max wheelchair distance: 100 Assist Level: Touching or steadying assistance (Pt > 75%) Assist Level: Touching or steadying assistance (Pt > 75%) Turns around,maneuvers to table,bed, and toilet,negotiates 3% grade,maneuvers on rugs and over doorsills: No Function - Locomotion: Ambulation Assistive device: Walker-rolling Max distance: 150 Assist level: Touching or steadying assistance (Pt > 75%) Assist level: Touching or steadying assistance (Pt > 75%) Assist level: Touching or steadying assistance (Pt > 75%) Assist level: Touching or steadying assistance (Pt > 75%) Walk 10 feet on uneven surfaces activity did not occur: Safety/medical concerns  Function -  Comprehension Comprehension: Auditory Comprehension assist level: Understands basic 90% of the time/cues < 10% of the time  Function - Expression Expression: Verbal Expression assist level: Expresses basic 50 - 74% of the time/requires cueing 25 - 49% of the time. Needs to repeat parts of sentences.  Function - Social Interaction Social Interaction assist level: Interacts appropriately 75 - 89% of the time - Needs redirection for appropriate language or to initiate interaction.  Function - Problem Solving Problem solving assist level: Solves basic 50 - 74% of the time/requires cueing 25 - 49% of the time  Function - Memory Memory assist level: Recognizes or recalls 25 - 49% of the time/requires cueing 50 - 75% of the time Patient normally able to recall (first 3 days  only): Current season, That he or she is in a hospital  Medical Problem List and Plan: 1.  Right hemiparesis, aphasia, dysphagia dysarthria secondary to left MCA infarct  Cont CIR PT, OT SLP 2.  DVT Prophylaxis/Anticoagulation: Lovenox 40 mg daily 3. Pain Management: Tylenol as needed, left shoulder pain improving 4. Mood: Celexa 40 mg daily 5. Neuropsych: This patient is capable of making decisions on he is own behalf. 6. Skin/Wound Care: Routine skin checks 7. Fluids/Electrolytes/Nutrition:               Dysphagia 1 thin, advance as tolerated 8. Acute on chronic combined congestive heart failure. Continue Lasix. Monitor for any signs of fluid overload.  -RLE swelling chronic lymphedema not CHF related  -BMP reviewed, no worsening of renal fxn on increased diuretic Filed Weights   02/17/16 0547 02/18/16 0600 02/19/16 0614  Weight: 94.8 kg (209 lb) 95.4 kg (210 lb 5.1 oz) 95.1 kg (209 lb 10.5 oz)  9. Hypertension. Cardura 4 mg daily, Imdur 30 mg daily, Coreg 50 mg twice a day, lisinopril 20 mg daily. Monitor with increased mobility, at home also took Amlodipine, apresoline, aldactone.  Will restart amlodipine 17m  Vitals:   02/18/16 2034 02/19/16 0614  BP:  (!) 161/84  Pulse: 62 68  Resp: 15 17  Temp:  98.3 F (36.8 C)    10. Diabetes mellitus with peripheral neuropathy. Hemoglobin A1c 6.4. Check blood sugars before meals and at bedtime. Diabetic teaching   CBGs bordering on hypoglycemia in am 1/16, only on SSI will need snack at night  CBG (last 3)   Recent Labs  02/18/16 1644 02/18/16 2048 02/19/16 0642  GLUCAP 122* 144* 65    11. Ulcerative colitis. Sulfasalazine 2000 mg twice a day 12. BPH. Flomax 0.4 mg daily at bedtime. Check PVR 3 13. Hyperlipidemia. Lipitor 14. CRI stage III. Creatinine 1.50-1.58, remains, avoid NSAIDs and Metformin, proteinuria noted  BMP stable 15. LE edema/Lymphedema: Cont diuretics and compression. Hypoalbuminemia is contributing--added  prostat  LOS (Days) 8 A FACE TO FACE EVALUATION WAS PERFORMED  Lakedra Washington E 02/19/2016, 6:59 AM

## 2016-02-20 ENCOUNTER — Ambulatory Visit: Payer: Medicare Other

## 2016-02-20 ENCOUNTER — Inpatient Hospital Stay (HOSPITAL_COMMUNITY): Payer: Medicare Other | Admitting: Occupational Therapy

## 2016-02-20 ENCOUNTER — Inpatient Hospital Stay (HOSPITAL_COMMUNITY): Payer: Medicare Other | Admitting: *Deleted

## 2016-02-20 ENCOUNTER — Inpatient Hospital Stay (HOSPITAL_COMMUNITY): Payer: Medicare Other | Admitting: Speech Pathology

## 2016-02-20 ENCOUNTER — Inpatient Hospital Stay (HOSPITAL_COMMUNITY): Payer: Medicare Other | Admitting: Physical Therapy

## 2016-02-20 ENCOUNTER — Inpatient Hospital Stay (HOSPITAL_COMMUNITY): Payer: Medicare Other

## 2016-02-20 LAB — GLUCOSE, CAPILLARY
Glucose-Capillary: 71 mg/dL (ref 65–99)
Glucose-Capillary: 111 mg/dL — ABNORMAL HIGH (ref 65–99)
Glucose-Capillary: 114 mg/dL — ABNORMAL HIGH (ref 65–99)
Glucose-Capillary: 186 mg/dL — ABNORMAL HIGH (ref 65–99)

## 2016-02-20 NOTE — Progress Notes (Signed)
Occupational Therapy Session Note  Patient Details  Name: [name] MRN: [id number] Date of Birth: [birth year]  Today's Date: 02/20/2016 OT Individual Time: 1470-9295 OT Individual Time Calculation (min): 45 min     Short Term Goals: Week 2:  OT Short Term Goal 1 (Week 2): Pt will continue working toward established supervision level goals for discharge.    Skilled Therapeutic Interventions/Progress Updates:    Pt completed bathing and dressing sit to stand at the sink this session.  Min assist for sit to stand and for all bathing with mod assist for dressing secondary to motor planning issues.  Decreased ability to reach the RLE for doffing and donning sock as well.  He still has lymphedema wrapping on the RLE at this time, so did not complete any washing of the lower left leg.  Min assist for stand pivot transfers to the bedside recliner and wheelchair.  Mod assist needed for integration of the RUE into bathing tasks, in order to squeeze out the washcloth and for washing and drying the right arm.  Pt left in recliner at end of session with call button and phone in reach.    Therapy Documentation Precautions:  Precautions Precautions: Fall Precaution Comments: right sided weakness Restrictions Weight Bearing Restrictions: No  Pain: Pain Assessment Pain Assessment: No/denies pain ADL: See Function Navigator for Current Functional Status.   Therapy/Group: Individual Therapy  Nathifa Ritthaler OTR/L 02/20/2016, 12:46 PM

## 2016-02-20 NOTE — Progress Notes (Signed)
Physical Therapy Note  Patient Details  Name: Cleatis Belville MRN: [id number] Date of Birth: [birth year] Today's Date: 02/20/2016  1300-1330, 30 min individual tx Pain: None reported  neuromuscular re-education via demo, VC for setatd 10 x 1 each: toe/heel raises, marching; sustained stretch R heel cord in sitting.  Gait on level tile and p/down 12 steps with RW.  Pt self selected leading up and down on stairs with RLE, safely.  Pt left resting in w/c with quick release belt applied and all needs within reach.  See function navigator for current status    Herman Fiero 02/20/2016, 12:34 PM

## 2016-02-20 NOTE — Progress Notes (Signed)
Physical Therapy Session Note  Patient Details  Name: Armstrong Lollis MRN: [id number] Date of Birth: [birth year]  Today's Date: 02/20/2016 PT Individual Time: 4388-8757 PT Individual Time Calculation (min): 31 min    Short Term Goals: Week 2:  PT Short Term Goal 1 (Week 2): STG = LTG's due to ELOS.  Skilled Therapeutic Interventions/Progress Updates:    Patient in w/c assisted to stand pivot to bed with RW and min A with cues, sit to supine assist for both legs onto bed.  Unwrapped lymphedema wraps and took measurements as follows of R LE  AT MTP joints: 24 cm At medial malleolus : 27.2 cm 5 cm prox to med mall: 23 cm 10 cm prox to med mall: 25.5 cm.  Based on previous measurements and visual comparison with L LE feel abnormal edema is no longer present.  Applied TED knee hi stocking after lotion to extremity.  RN aware.  Will be available to consult again if edema re-accumulates.   Therapy Documentation Precautions:  Precautions Precautions: Fall Precaution Comments: right sided weakness Restrictions Weight Bearing Restrictions: No Pain: Pain Assessment Pain Assessment: No/denies pain   See Function Navigator for Current Functional Status.   Therapy/Group: Individual Therapy  Elray Mcgregor  [place], [place] [phone] 02/20/2016  02/20/2016, 5:26 PM

## 2016-02-20 NOTE — Progress Notes (Signed)
Physical Therapy Session Note  Patient Details  Name: [name] MRN: [id number] Date of Birth: 11/12/1960  Today's Date: 02/20/2016 PT Individual Time: 0800-0915 PT Individual Time Calculation (min): 75 min    Short Term Goals: Week 1:  PT Short Term Goal 1 (Week 1): Patient will transfer bed <> wheelchair with min A using LRAD. PT Short Term Goal 1 - Progress (Week 1): Met PT Short Term Goal 2 (Week 1): Patient will ambulate 150 ft using LRAD with min A.  PT Short Term Goal 2 - Progress (Week 1): Met PT Short Term Goal 3 (Week 1): Patient will negotiate up/down 12 stairs using 2 rails with min A for strengthening.  PT Short Term Goal 3 - Progress (Week 1): Met PT Short Term Goal 4 (Week 1): Patient will maintain dynamic standing balance x 2 min with min A. PT Short Term Goal 4 - Progress (Week 1): Met Week 2:  PT Short Term Goal 1 (Week 2): STG = LTG's due to ELOS.  Skilled Therapeutic Interventions/Progress Updates:     Pt received supine in bed and agreeable to PT. Supine>sit transfer with supervision assist from semirecumbent position and min cues with use of LLE to stabilze on bed.   Gait through hall x 150ft with min assist, RW and R hand splint. Dynamic gait over 6 inch curb with RW, rail, and Mod assist from PT. Gait also through uneven mulched surface with min assist and one instance of mod assist due to posterior LOB.   Bed mobility to supine position through lob roll with min assist from PT with multimodal cues to manage the RLE and proper sequencing of movements. Return to sitting position on L with supervision assist from PT with min cues for use of UE to manage the RLE.   Supine NMR:  Bridges with Dycem to stabillize the RLE,  SLR x 10 BLE with AAROM progressing to AROM on the RLE.  Sidelying Clam shells x 12 LLE and x10 RLE Isometric hip abduction to squeeze ball x 12 BLE.  Gait back to room with intermittent supervision-min assist with RW. Toilettransfer with  min assist for BM. PT require dto provide mod assist to manage lower body clothing.   PT instructed patient in [place] level B exercise program min assist throughout and min cues for improved R hip stabilization and increased knee flexion to activate HS and improve foot clearance. .   Patient returned too room and left sitting in recliner with call bell in reach and all needs met.          Therapy Documentation Precautions:  Precautions Precautions: Fall Precaution Comments: right sided weakness Restrictions Weight Bearing Restrictions: No    Vital Signs: Therapy Vitals Temp: 98 F (36.7 C) Temp Source: Oral Pulse Rate: 65 Resp: 18 BP: (!) 145/65 Patient Position (if appropriate): Lying Oxygen Therapy SpO2: 99 % O2 Device: Not Delivered Pain: 0/10  See Function Navigator for Current Functional Status.   Therapy/Group: Individual Therapy   E  02/20/2016, 9:15 AM  

## 2016-02-20 NOTE — Progress Notes (Signed)
Social Work Patient ID: [name], male   DOB: 1960-04-03, 56 y.o.   MRN: [id number]  Met with pt to discuss team conference progress toward his goals and the plan to pursue NH in [place] area closer to his  Brother. He is aware and is agreeable to this plan. Will contact brother and inform of team conference update. Will complete FL2 and fax out to facilities.

## 2016-02-20 NOTE — Patient Care Conference (Signed)
Inpatient RehabilitationTeam Conference and Plan of Care Update Date: 02/20/2016   Time: 10:40 AM    Patient Name: [name]      Medical Record Number: [id number]  Date of Birth: Jun 23, 1960 Sex: Male         Room/Bed: 4M02C/4M02C-01 Payor Info: Payor: MEDICARE / Plan: MEDICARE PART A AND B / Product Type: *No Product type* /    Admitting Diagnosis: CVA  Admit Date/Time:  02/11/2016  2:58 PM Admission Comments: No comment available   Primary Diagnosis:  <principal problem not specified> Principal Problem: <principal problem not specified>  Patient Active Problem List   Diagnosis Date Noted  . Acute on chronic combined systolic and diastolic heart failure (HCC)   . Right hemiparesis (HCC)   . 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 Expressive aphasia   . Left hemiparesis (HCC)   . Depression   . Dysphagia, post-stroke   . Benign essential HTN   . Type 2 diabetes mellitus with peripheral neuropathy (HCC)   . Ulcerative colitis with complication (HCC)   . Benign prostatic hyperplasia   . Hyperlipidemia   . Stage 3 chronic kidney disease   . Peripheral edema   . Cerebral infarction due to occlusion of left middle cerebral artery (HCC)   . Hypertension   . Right sided weakness   . Acute on chronic congestive heart failure (HCC)   . Stroke (cerebrum) (HCC) 02/05/2016  . Hypokalemia 02/05/2016  . Hypertensive urgency 02/05/2016  . Acute CVA (cerebrovascular accident) (HCC) 02/05/2016  . Anemia of chronic disease 05/21/2015  . Diabetes mellitus, type II, insulin dependent (HCC) 05/21/2015  . OSA ? compliacne 05/21/2015  . Homelessness 05/21/2015  . Weakness   . AKI (acute kidney injury) (HCC) 05/20/2015  . Essential hypertension 05/20/2015  . Acute on chronic combined systolic and diastolic HF (heart failure) (HCC) 05/20/2015  . Weakness of right lower extremity 05/20/2015    Expected Discharge Date: Expected Discharge Date: 02/26/16  Team Members  Present: Physician leading conference: Dr. Claudette Laws Social Worker Present: Dossie Der, LCSW Nurse Present: Kennon Portela, RN PT Present: Wanda Plump, PT;Rodney Leo Grosser, Antonietta Jewel, PT OT Present: Perrin Maltese, OT SLP Present: Jackalyn Lombard, SLP PPS Coordinator present : Edson Snowball, PT     Current Status/Progress Goal Weekly Team Focus  Medical   has pincer grasp, some elbow flexion tone, speech improving , some apraxia, cog def  sae and adequate po intake  eval for diet upgrade   Bowel/Bladder   Continent of bowel and bladder. LBM 02/07/16  Managed bowel and bladder  Monitor   Swallow/Nutrition/ Hydration   improving mastication of solids with min-mod verbal cues for attention to boluses   supervision   trial meal tray of dys 2 textures prior to upgrade   ADL's   min assist for bathing, toileting, walk-in shower transfers.  Mod assist for UB and LB dressing.  He demonstrates Brunnstrum stage V movement in the RUE with functional tasks.  Min to mod assist for use with selfcare tasks and managing clothing.   supervison to min assist.   Neuromuscular re-education, balance, functional transfers, pt/family education,   Mobility   intermittent min-supervision assist for gait and transfers with RW, can be supervision-mod assist for bed mobility depending on arousal.   Supervision assist over all   improved awareness control of the RLE/RUE, balance, enduracne, gait, safety with transfers and mobility,    Communication   improving word finding, min assist; needs mod assist  for intelligibility   supervision   continue to address carryover of intelligibility and word finding strategies.    Safety/Cognition/ Behavioral Observations  min-mod assist   supervision   continue to address attention to tasks and functional problem solving    Pain   No c/o pain  <3  Assess for nonverbal cues of pain   Skin   CDI  CDI  Assess q shift      *See Care Plan and progress notes for long  and short-term goals.  Barriers to Discharge: family cannot provide assist    Possible Resolutions to Barriers:  SW to eval for SNF    Discharge Planning/Teaching Needs:  Pursuing NHP in the [place] area working with brother on this plan.       Team Discussion:  Progressing toward his goals mostly min assist. Trial of Dys 2 diet today to see if can upgrade. Motor planning issues. MD-adjusting BP and BS meds. Not on insulin and may not need to at discharge. Lymphedema wrapping to be changed-PT to be contacted to come back in. Looking for NHP in [place].  Revisions to Treatment Plan:  NHP   Continued Need for Acute Rehabilitation Level of Care: The patient requires daily medical management by a physician with specialized training in physical medicine and rehabilitation for the following conditions: Daily direction of a multidisciplinary physical rehabilitation program to ensure safe treatment while eliciting the highest outcome that is of practical value to the patient.: Yes Daily medical management of patient stability for increased activity during participation in an intensive rehabilitation regime.: Yes Daily analysis of laboratory values and/or radiology reports with any subsequent need for medication adjustment of medical intervention for : Neurological problems  Lucy Chris 02/20/2016, 12:38 PM

## 2016-02-20 NOTE — Progress Notes (Signed)
Recreational Therapy Assessment and Plan  Patient Details  Name: [name] MRN: [id number] Date of Birth: 02-19-1960 Today's Date: 02/20/2016  Rehab Potential: Good ELOS: d/c 02/26/16  Assessment Clinical Impression:  Problem List: Patient Active Problem List   Diagnosis Date Noted  . 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 Expressive aphasia   . Left hemiparesis ([place])   . Depression   . Dysphagia, post-stroke   . Benign essential HTN   . Type 2 diabetes mellitus with peripheral neuropathy (HCC)   . Ulcerative colitis with complication ([place])   . Benign prostatic hyperplasia   . Hyperlipidemia   . Stage 3 chronic kidney disease   . Peripheral edema   . Cerebral infarction due to occlusion of left middle cerebral artery ([place])   . Hypertension   . Right sided weakness   . Acute on chronic congestive heart failure ([place])   . Stroke (cerebrum) ([place]) 02/05/2016  . Hypokalemia 02/05/2016  . Hypertensive urgency 02/05/2016  . Acute CVA (cerebrovascular accident) ([place]) 02/05/2016  . Anemia of chronic disease 05/21/2015  . Diabetes mellitus, type II, insulin dependent ([place]) 05/21/2015  . OSA ? compliacne 05/21/2015  . Homelessness 05/21/2015  . Weakness   . AKI (acute kidney injury) ([place]) 05/20/2015  . Essential hypertension 05/20/2015  . Acute on chronic combined systolic and diastolic HF (heart failure) ([place]) 05/20/2015  . Weakness of right lower extremity 05/20/2015    Past Medical History:      Past Medical History:  Diagnosis Date  . BPH (benign prostatic hyperplasia)   . CHF (congestive heart failure) ([place])   . Diabetes mellitus without complication ([place])   . Diabetic retinopathy associated with type 2 diabetes mellitus ([place])   . Hypertension   . Lymphedema   . OSA (obstructive sleep apnea)   . TIA (transient ischemic attack)    Past Surgical History:       Past Surgical History:  Procedure Laterality Date  .  BLADDER SURGERY    . CARDIAC CATHETERIZATION    . EP IMPLANTABLE DEVICE N/A 02/08/2016   Procedure: Loop Recorder Insertion;  Surgeon: Deboraha Sprang, MD;  Location: [facility];  Service: Cardiovascular;  Laterality: N/A;  . TEE WITHOUT CARDIOVERSION N/A 02/08/2016   Procedure: TRANSESOPHAGEAL ECHOCARDIOGRAM (TEE);  Surgeon: Thayer Headings, MD;  Location: [facility];  Service: Cardiovascular;  Laterality: N/A;    Assessment & Plan Clinical Impression: [name] a 56 y.o.right handed malewith history of diastolic congestive heart failure,OSA,diabetes mellitus and peripheral neuropathy, hypertension, TIA maintained on aspirin, chronic renal insufficiency creatinine baseline 1.50-1.58. Per chart review patient lives alone. Reported to be independent with assistive device prior to admission. One level apartment. Presented 02/05/2016 with slurred speech, altered mental status and right-sided weakness. He was hypertensive 200/140. EKG showed sinus rhythm with LAFB and LVH. Chest x-ray new cardiomegaly with pulmonary vascular congestion and slight bilateral interstitial edema. Cranial CT scan negative for acute changes. 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 CT cerebral perfusion showed acute small to moderate-sized left MCA territory infarct. Patient did not receive TPA. CT angiogram head and neck showed acute left M3 occlusion as well as scattered atheromatousplaque. MRI completed 02/07/2016 showed moderate left frontal lobe MCA territory infarct as well as old bilateral basal ganglia infarcts and right occipital lobe. Echocardiogram with ejection fraction of 35% diffuse hypokinesis with grade 2 diastolic dysfunction. Venous Doppler studies negative for DVT. Maintained on aspirin for Plavix/ CVA prophylaxis 3 months then Plavix  alone. Subcutaneous Lovenox for DVT prophylaxis. Dysphagia# 2thin liquid diets.TEE completed 02/08/2016 showing severe LV dysfunction with EF of  30% no thrombi and loop recorder was placed. Physical and occupational therapy evaluations completed with recommendations of physical medicine rehabilitation consult. Patient transferred to CIR on 02/11/2016 and is now being referred for participation in TR group.   Plan Min 1 TR session/group >20 minutes during LOS  Recommendations for other services: None   Discharge Criteria: Patient will be discharged from TR if patient refuses treatment 3 consecutive times without medical reason.  If treatment goals not met, if there is a change in medical status, if patient makes no progress towards goals or if patient is discharged from hospital.  The above assessment, treatment plan, treatment alternatives and goals were discussed and mutually agreed upon: by patient   Session Note:  02/20/16 Pain:no c/o Pt referred by team for participation in TR group with emphasis on Community Reintegration(CR).  Group Goals:  After discussion: >Pt will state 3 types of barriers/obstacles that could be encountered during community pursuits with min cues. > Pt will problem solve at least 2 modifications/solutions to above identified barriers/obstacles with min cues.  Pt will state at least 3 ways to conserve energy during community pursuits with min cues.  Pt participated in [employer] group for 45  Minutes. All goals listed above met.    [place] 02/20/2016, 9:24 AM

## 2016-02-20 NOTE — Progress Notes (Signed)
Subjective/Complaints: No issues overnite per CNA occ incont at noc, cont during the day   ROS: Denies CP, SOB, N/V/D.  Objective: Vital Signs: Blood pressure (!) 145/65, pulse 65, temperature 98 F (36.7 C), temperature source Oral, resp. rate 18, height 5' 11"  (1.803 m), weight 95.5 kg (210 lb 8.6 oz), SpO2 99 %. No results found. Results for orders placed or performed during the hospital encounter of 02/11/16 (from the past 72 hour(s))  Glucose, capillary     Status: Abnormal   Collection Time: 02/17/16 11:32 AM  Result Value Ref Range   Glucose-Capillary 100 (H) 65 - 99 mg/dL  Glucose, capillary     Status: Abnormal   Collection Time: 02/17/16  4:26 PM  Result Value Ref Range   Glucose-Capillary 139 (H) 65 - 99 mg/dL  Glucose, capillary     Status: Abnormal   Collection Time: 02/17/16  8:34 PM  Result Value Ref Range   Glucose-Capillary 122 (H) 65 - 99 mg/dL  Basic metabolic panel     Status: Abnormal   Collection Time: 02/18/16  3:54 AM  Result Value Ref Range   Sodium 141 135 - 145 mmol/L   Potassium 3.9 3.5 - 5.1 mmol/L   Chloride 102 101 - 111 mmol/L   CO2 30 22 - 32 mmol/L   Glucose, Bld 87 65 - 99 mg/dL   BUN 21 (H) 6 - 20 mg/dL   Creatinine, Ser 1.41 (H) 0.61 - 1.24 mg/dL   Calcium 8.9 8.9 - 10.3 mg/dL   GFR calc non Af Amer 55 (L) >60 mL/min   GFR calc Af Amer >60 >60 mL/min    Comment: (NOTE) The eGFR has been calculated using the CKD EPI equation. This calculation has not been validated in all clinical situations. eGFR's persistently <60 mL/min signify possible Chronic Kidney Disease.    Anion gap 9 5 - 15  Glucose, capillary     Status: None   Collection Time: 02/18/16  6:37 AM  Result Value Ref Range   Glucose-Capillary 90 65 - 99 mg/dL  Glucose, capillary     Status: Abnormal   Collection Time: 02/18/16 11:31 AM  Result Value Ref Range   Glucose-Capillary 105 (H) 65 - 99 mg/dL  Glucose, capillary     Status: Abnormal   Collection Time:  02/18/16  4:44 PM  Result Value Ref Range   Glucose-Capillary 122 (H) 65 - 99 mg/dL  Glucose, capillary     Status: Abnormal   Collection Time: 02/18/16  8:48 PM  Result Value Ref Range   Glucose-Capillary 144 (H) 65 - 99 mg/dL  Glucose, capillary     Status: None   Collection Time: 02/19/16  6:42 AM  Result Value Ref Range   Glucose-Capillary 65 65 - 99 mg/dL   Comment 1 Notify RN   Glucose, capillary     Status: None   Collection Time: 02/19/16  7:36 AM  Result Value Ref Range   Glucose-Capillary 90 65 - 99 mg/dL  Glucose, capillary     Status: Abnormal   Collection Time: 02/19/16 11:20 AM  Result Value Ref Range   Glucose-Capillary 125 (H) 65 - 99 mg/dL  Glucose, capillary     Status: Abnormal   Collection Time: 02/19/16  5:03 PM  Result Value Ref Range   Glucose-Capillary 118 (H) 65 - 99 mg/dL  Glucose, capillary     Status: Abnormal   Collection Time: 02/19/16  8:32 PM  Result Value Ref Range   Glucose-Capillary  150 (H) 65 - 99 mg/dL  Glucose, capillary     Status: None   Collection Time: 02/20/16  6:47 AM  Result Value Ref Range   Glucose-Capillary 71 65 - 99 mg/dL     HEENT: Normocephaic, atraumatic Cardio: RRR. No JVD Resp: clear without wheezes. Unlabored. GI: BS positive and NT, ND Skin:   Intact. Warm and dry.  Neuro: alert.   Flat Motor: 4/5 LUE proximal to distal LLE: 4/5 proxima to distal  RUE: 4-/5 proximal to distal RLE: 4-/5 HF KE ADF Dysarthric speech  Musc/Skel:  No tenderness. Right foot Edema Gen NAD. Vital signs reviewed.    Assessment/Plan: 1. Functional deficits secondary to Left MCA infarct with aphasia, Right hemiparesis, dysarthria which require 3+ hours per day of interdisciplinary therapy in a comprehensive inpatient rehab setting. Physiatrist is providing close team supervision and 24 hour management of active medical problems listed below. Physiatrist and rehab team continue to assess barriers to discharge/monitor patient progress  toward functional and medical goals. FIM: Function - Bathing Position: Wheelchair/chair at sink Body parts bathed by patient: Right arm, Chest, Abdomen, Front perineal area, Right upper leg, Left upper leg, Left lower leg, Buttocks Body parts bathed by helper: Back, Left arm Bathing not applicable: Right lower leg (Pt with lymphedema wrapping) Assist Level:  (Mod A)  Function- Upper Body Dressing/Undressing What is the patient wearing?: Pull over shirt/dress Pull over shirt/dress - Perfomed by patient: Put head through opening, Pull shirt over trunk, Thread/unthread left sleeve Pull over shirt/dress - Perfomed by helper: Thread/unthread right sleeve Assist Level: Touching or steadying assistance(Pt > 75%) Function - Lower Body Dressing/Undressing What is the patient wearing?: Underwear, Pants, Socks Position: Wheelchair/chair at sink Underwear - Performed by patient: Thread/unthread left underwear leg Underwear - Performed by helper: Thread/unthread right underwear leg Pants- Performed by patient: Thread/unthread left pants leg, Pull pants up/down Pants- Performed by helper: Thread/unthread right pants leg Non-skid slipper socks- Performed by patient: Don/doff left sock Non-skid slipper socks- Performed by helper: Don/doff left sock (right sock NA secondary to lymphedema wrapping) TED Hose - Performed by helper: Don/doff right TED hose, Don/doff left TED hose Assist for footwear: Dependant Assist for lower body dressing:  (Total A)  Function - Toileting Toileting activity did not occur: No continent bowel/bladder event Toileting steps completed by patient: Adjust clothing prior to toileting Toileting steps completed by helper: Performs perineal hygiene, Adjust clothing after toileting Toileting Assistive Devices: Grab bar or rail, Other (comment) Assist level: Touching or steadying assistance (Pt.75%)  Function - Air cabin crew transfer activity did not occur:  Refused Toilet transfer assistive device: Elevated toilet seat/BSC over toilet, Walker Assist level to toilet: Touching or steadying assistance (Pt > 75%) Assist level from toilet: Touching or steadying assistance (Pt > 75%)  Function - Chair/bed transfer Chair/bed transfer method: Ambulatory Chair/bed transfer assist level: Touching or steadying assistance (Pt > 75%) Chair/bed transfer assistive device: Walker Chair/bed transfer details: Verbal cues for precautions/safety  Function - Locomotion: Wheelchair Will patient use wheelchair at discharge?: Yes Type: Manual Max wheelchair distance: 100 Assist Level: Touching or steadying assistance (Pt > 75%) Assist Level: Touching or steadying assistance (Pt > 75%) Turns around,maneuvers to table,bed, and toilet,negotiates 3% grade,maneuvers on rugs and over doorsills: No Function - Locomotion: Ambulation Assistive device: Walker-rolling, No device, Orthosis (R hand orthosis; 60 ft without AD min assist, 200 ft with RW and steady assist) Max distance: 200 ft Assist level: Touching or steadying assistance (Pt > 75%) Assist level:  Touching or steadying assistance (Pt > 75%) Assist level: Touching or steadying assistance (Pt > 75%) Assist level: Touching or steadying assistance (Pt > 75%) Walk 10 feet on uneven surfaces activity did not occur: Safety/medical concerns  Function - Comprehension Comprehension: Auditory Comprehension assist level: Understands basic 90% of the time/cues < 10% of the time  Function - Expression Expression: Verbal Expression assist level: Expresses basic 50 - 74% of the time/requires cueing 25 - 49% of the time. Needs to repeat parts of sentences.  Function - Social Interaction Social Interaction assist level: Interacts appropriately 75 - 89% of the time - Needs redirection for appropriate language or to initiate interaction.  Function - Problem Solving Problem solving assist level: Solves basic 50 - 74% of the  time/requires cueing 25 - 49% of the time  Function - Memory Memory assist level: Recognizes or recalls 25 - 49% of the time/requires cueing 50 - 75% of the time Patient normally able to recall (first 3 days only): Current season, That he or she is in a hospital  Medical Problem List and Plan: 1.  Right hemiparesis, aphasia, dysphagia dysarthria secondary to left MCA infarct  Cont CIR PT, OT SLP Team conference today please see physician documentation under team conference tab, met with team face-to-face to discuss problems,progress, and goals. Formulized individual treatment plan based on medical history, underlying problem and comorbidities. 2.  DVT Prophylaxis/Anticoagulation: Lovenox 40 mg daily 3. Pain Management: Tylenol as needed, left shoulder pain improving 4. Mood: Celexa 40 mg daily 5. Neuropsych: This patient is capable of making decisions on he is own behalf. 6. Skin/Wound Care: Routine skin checks 7. Fluids/Electrolytes/Nutrition:               Dysphagia 1 thin, advance as tolerated 8. Acute on chronic combined congestive heart failure. Continue Lasix. Monitor for any signs of fluid overload.  -RLE swelling chronic lymphedema not CHF related  -BMP reviewed, no worsening of renal fxn on increased diuretic Filed Weights   02/18/16 0600 02/19/16 0614 02/20/16 0523  Weight: 95.4 kg (210 lb 5.1 oz) 95.1 kg (209 lb 10.5 oz) 95.5 kg (210 lb 8.6 oz)  9. Hypertension. Cardura 4 mg daily, Imdur 30 mg daily, Coreg 50 mg twice a day, lisinopril 20 mg daily. Monitor with increased mobility, at home also took Amlodipine, apresoline, aldactone.  Will restart amlodipine 39m  Vitals:   02/19/16 2245 02/20/16 0523  BP:  (!) 145/65  Pulse: 62 65  Resp: 17 18  Temp:  98 F (36.7 C)    10. Diabetes mellitus with peripheral neuropathy. Hemoglobin A1c 6.4. Check blood sugars before meals and at bedtime. Diabetic teaching   CBGs bordering on hypoglycemia in am 1/17, only on SSI will need snack  at night  CBG (last 3)   Recent Labs  02/19/16 1703 02/19/16 2032 02/20/16 0647  GLUCAP 118* 150* 71    11. Ulcerative colitis. Sulfasalazine 2000 mg twice a day, no diarrhea, cont of bowel 12. BPH. Flomax 0.4 mg daily at bedtime. Check PVR 3 13. Hyperlipidemia. Lipitor 14. CRI stage III. Creatinine 1.50-1.58, remains, avoid NSAIDs and Metformin, proteinuria noted  BMP stable 15. LE edema/Lymphedema: Cont diuretics and compression. Hypoalbuminemia is contributing--added prostat  LOS (Days) 9 A FACE TO FACE EVALUATION WAS PERFORMED  [name] E 02/20/2016, 7:46 AM

## 2016-02-20 NOTE — Progress Notes (Signed)
Speech Language Pathology Daily Session Note  Patient Details  Name: [name] MRN: [id number] Date of Birth: 04-10-1960  Today's Date: 02/20/2016 SLP Individual Time: 7425-9563 SLP Individual Time Calculation (min): 55 min   Short Term Goals: Week 2: SLP Short Term Goal 1 (Week 2): Pt will consume dys 1 textures and thin liquids with supervision verbal cues for use of swallowing precautions and minimal overt s/s of aspiration.   SLP Short Term Goal 2 (Week 2): Pt will consume therapeutic trials of dys 2 textures with min verbal cues for use of swallowing precautions over 2 consecutive sessions prior to advancement.   SLP Short Term Goal 3 (Week 2): Pt will visually scan to the right of midline during basic functional tasks in >75% of opportunities with mod I.   SLP Short Term Goal 4 (Week 2): Pt will recognize and correct verbal errors in the moment during phrase/sentence level expression with supervision verbal cues.  SLP Short Term Goal 5 (Week 2): Pt will utilize increased vocal intensity and overarticulation to achieve intelligibility at the sentence level with min verbal cues.   SLP Short Term Goal 6 (Week 2): Pt will utilize compensatory word finding strategies at the phrase/sentence level with supervision verbal cues.    Skilled Therapeutic Interventions: Pt was seen for skilled ST targeting goals for dysphagia and cognition.  SLP facilitated the session with money management tasks to address visual scanning, attention to task, and functional problem solving.  Pt needed mod assist verbal and visual cues for working memory and task organization to count money and make change for 100% accuracy.  Pt also consumed a trial meal tray of dys 2 textures and thin liquids with supervision cues for use of swallowing precautions.  Oral holding was significantly diminished and attention to boluses was appropriate with advanced textures which SLP suspects to be due to pt preference.  No overt s/s of  aspiration were evident with advanced solids or thin liquids.  Recommend diet advancement to dys 2 textures at this time with ongoing full supervision for use of swallowing precautions.  Pt was returned to room and left in wheelchair with quick release belt donned.  Continue per current plan of care.     Function:  Eating Eating   Modified Consistency Diet: Yes Eating Assist Level: Set up assist for;Supervision or verbal cues   Eating Set Up Assist For: Opening containers       Cognition Comprehension Comprehension assist level: Understands basic 90% of the time/cues < 10% of the time  Expression   Expression assist level: Expresses basic 50 - 74% of the time/requires cueing 25 - 49% of the time. Needs to repeat parts of sentences.  Social Interaction Social Interaction assist level: Interacts appropriately 75 - 89% of the time - Needs redirection for appropriate language or to initiate interaction.  Problem Solving Problem solving assist level: Solves basic 50 - 74% of the time/requires cueing 25 - 49% of the time  Memory Memory assist level: Recognizes or recalls 25 - 49% of the time/requires cueing 50 - 75% of the time    Pain Pain Assessment Pain Assessment: No/denies pain  Therapy/Group: Individual Therapy  Lui Bellis, Melanee Spry 02/20/2016, 12:43 PM

## 2016-02-20 NOTE — Progress Notes (Signed)
Social Work Lucy Chris, LCSW Social Worker Signed   Patient Care Conference Date of Service: 02/20/2016 12:38 PM      Hide copied text Hover for attribution information Inpatient RehabilitationTeam Conference and Plan of Care Update Date: 02/20/2016   Time: 10:40 AM      Patient Name: Lemont Sitzmann      Medical Record Number: [id number]  Date of Birth: 09-29-1960 Sex: Male         Room/Bed: 4M02C/4M02C-01 Payor Info: Payor: MEDICARE / Plan: MEDICARE PART A AND B / Product Type: *No Product type* /     Admitting Diagnosis: CVA  Admit Date/Time:  02/11/2016  2:58 PM Admission Comments: No comment available    Primary Diagnosis:  <principal problem not specified> Principal Problem: <principal problem not specified>       Patient Active Problem List    Diagnosis Date Noted  . Acute on chronic combined systolic and diastolic heart failure (HCC)    . Right hemiparesis (HCC)    . 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 Expressive aphasia    . Left hemiparesis (HCC)    . Depression    . Dysphagia, post-stroke    . Benign essential HTN    . Type 2 diabetes mellitus with peripheral neuropathy (HCC)    . Ulcerative colitis with complication (HCC)    . Benign prostatic hyperplasia    . Hyperlipidemia    . Stage 3 chronic kidney disease    . Peripheral edema    . Cerebral infarction due to occlusion of left middle cerebral artery (HCC)    . Hypertension    . Right sided weakness    . Acute on chronic congestive heart failure (HCC)    . Stroke (cerebrum) (HCC) 02/05/2016  . Hypokalemia 02/05/2016  . Hypertensive urgency 02/05/2016  . Acute CVA (cerebrovascular accident) (HCC) 02/05/2016  . Anemia of chronic disease 05/21/2015  . Diabetes mellitus, type II, insulin dependent (HCC) 05/21/2015  . OSA ? compliacne 05/21/2015  . Homelessness 05/21/2015  . Weakness    . AKI (acute kidney injury) (HCC) 05/20/2015  . Essential hypertension 05/20/2015  . Acute on  chronic combined systolic and diastolic HF (heart failure) (HCC) 05/20/2015  . Weakness of right lower extremity 05/20/2015      Expected Discharge Date: Expected Discharge Date: 02/26/16   Team Members Present: Physician leading conference: Dr. Claudette Laws Social Worker Present: Dossie Der, LCSW Nurse Present: Kennon Portela, RN PT Present: Wanda Plump, PT;Rodney Leo Grosser, Antonietta Jewel, PT OT Present: Perrin Maltese, OT SLP Present: Jackalyn Lombard, SLP PPS Coordinator present : Edson Snowball, PT       Current Status/Progress Goal Weekly Team Focus  Medical   has pincer grasp, some elbow flexion tone, speech improving , some apraxia, cog def  sae and adequate po intake  eval for diet upgrade   Bowel/Bladder   Continent of bowel and bladder. LBM 02/07/16  Managed bowel and bladder  Monitor   Swallow/Nutrition/ Hydration   improving mastication of solids with min-mod verbal cues for attention to boluses   supervision   trial meal tray of dys 2 textures prior to upgrade   ADL's   min assist for bathing, toileting, walk-in shower transfers.  Mod assist for UB and LB dressing.  He demonstrates Brunnstrum stage V movement in the RUE with functional tasks.  Min to mod assist for use with selfcare tasks and managing clothing.   supervison to min assist.   Neuromuscular re-education,  balance, functional transfers, pt/family education,   Mobility   intermittent min-supervision assist for gait and transfers with RW, can be supervision-mod assist for bed mobility depending on arousal.   Supervision assist over all   improved awareness control of the RLE/RUE, balance, enduracne, gait, safety with transfers and mobility,    Communication   improving word finding, min assist; needs mod assist for intelligibility   supervision   continue to address carryover of intelligibility and word finding strategies.    Safety/Cognition/ Behavioral Observations min-mod assist   supervision   continue to address  attention to tasks and functional problem solving    Pain   No c/o pain  <3  Assess for nonverbal cues of pain   Skin   CDI  CDI  Assess q shift       *See Care Plan and progress notes for long and short-term goals.   Barriers to Discharge: family cannot provide assist   Possible Resolutions to Barriers:  SW to eval for SNF   Discharge Planning/Teaching Needs:  Pursuing NHP in the [place] area working with brother on this plan.       Team Discussion:  Progressing toward his goals mostly min assist. Trial of Dys 2 diet today to see if can upgrade. Motor planning issues. MD-adjusting BP and BS meds. Not on insulin and may not need to at discharge. Lymphedema wrapping to be changed-PT to be contacted to come back in. Looking for NHP in [place].  Revisions to Treatment Plan:  NHP    Continued Need for Acute Rehabilitation Level of Care: The patient requires daily medical management by a physician with specialized training in physical medicine and rehabilitation for the following conditions: Daily direction of a multidisciplinary physical rehabilitation program to ensure safe treatment while eliciting the highest outcome that is of practical value to the patient.: Yes Daily medical management of patient stability for increased activity during participation in an intensive rehabilitation regime.: Yes Daily analysis of laboratory values and/or radiology reports with any subsequent need for medication adjustment of medical intervention for : Neurological problems   Lucy Chris 02/20/2016, 12:38 PM      Lucy Chris, LCSW Social Worker Signed   Patient Care Conference Date of Service: 02/13/2016  2:52 PM      Hide copied text Hover for attribution information Inpatient RehabilitationTeam Conference and Plan of Care Update Date: 02/13/2016   Time: 10:55 AM      Patient Name: Yee Joss      Medical Record Number: [id number]  Date of Birth: Oct 25, 1960 Sex: Male           Room/Bed: 4W23C/4W23C-01 Payor Info: Payor: MEDICARE / Plan: MEDICARE PART A AND B / Product Type: *No Product type* /     Admitting Diagnosis: CVA  Admit Date/Time:  02/11/2016  2:58 PM Admission Comments: No comment available    Primary Diagnosis:  <principal problem not specified> Principal Problem: <principal problem not specified>       Patient Active Problem List    Diagnosis Date Noted  . 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 Expressive aphasia    . Left hemiparesis (HCC)    . Depression    . Dysphagia, post-stroke    . Benign essential HTN    . Type 2 diabetes mellitus with peripheral neuropathy (HCC)    . Ulcerative colitis with complication (HCC)    . Benign prostatic hyperplasia    . Hyperlipidemia    . Stage  3 chronic kidney disease    . Peripheral edema    . Cerebral infarction due to occlusion of left middle cerebral artery (HCC)    . Hypertension    . Right sided weakness    . Acute on chronic congestive heart failure (HCC)    . Stroke (cerebrum) (HCC) 02/05/2016  . Hypokalemia 02/05/2016  . Hypertensive urgency 02/05/2016  . Acute CVA (cerebrovascular accident) (HCC) 02/05/2016  . Anemia of chronic disease 05/21/2015  . Diabetes mellitus, type II, insulin dependent (HCC) 05/21/2015  . OSA ? compliacne 05/21/2015  . Homelessness 05/21/2015  . Weakness    . AKI (acute kidney injury) (HCC) 05/20/2015  . Essential hypertension 05/20/2015  . Acute on chronic combined systolic and diastolic HF (heart failure) (HCC) 05/20/2015  . Weakness of right lower extremity 05/20/2015      Expected Discharge Date: Expected Discharge Date: 02/26/16   Team Members Present: Physician leading conference: Dr. Claudette Laws Social Worker Present: Dossie Der, LCSW Nurse Present: Carmie End, RN PT Present: Wanda Plump, Antonietta Jewel, PT OT Present: Perrin Maltese, OT SLP Present: Jackalyn Lombard, SLP PPS Coordinator present : Tora Duck, RN, CRRN        Current Status/Progress Goal Weekly Team Focus  Medical   Patient has communication deficits, multifactorial with a aphasia, apraxia and dysarthria. Has some difficulty with swallowing. Level alertness fluctuates  Home with safe by mouth intake  Improve safety awareness, reduced impulsivity   Bowel/Bladder   Occasional incontinence of bladder. LBM1/8  Staff implementing a tolieting schedule q 3 hrs and after meals  Staff continue with schedule, and encourage patient to call to make needs known   Swallow/Nutrition/ Hydration   Dys 1, thin liquids; full supervision   supervision   toleration of current diet and trials of advanced consistencies    ADL's   Overall Max/Total A ADL  Supervision overall  NMR, functional use of R UE, functional transfers, attention to R, ADL modifications   Mobility   mod assist overall for mobilty including transfers, gait, bed mobility.   Supervision assist with LRAD   Neuromuscular re-ed for the LUE/LLE. strength, balance, safety, improved independance with gait and transfers.    Communication   mod assist   supervision   speech intelligibility and word finding strategies; awareness of verbal errors    Safety/Cognition/ Behavioral Observations mod assist   supervision   attention to tasks, awareness of deficits, visual scanning to the right of midline   Pain   no complaints of pain   Continue to assess pain q shift and PRN  Tylenol 325-650 mg q 4 hrs PRN for pain   Skin   No skin issue noted  Continue to assess skin, and use epbc cream daily  Continue to monitor and assess skin q shift       *See Care Plan and progress notes for long and short-term goals.   Barriers to Discharge: Family cannot provide assistance   Possible Resolutions to Barriers:  May need SNF or ALF placement depending on progress   Discharge Planning/Teaching Needs:  Plan is for pt to move to the [place] area where his brother and sister in-law are. They can not provide care, so plan is  either SNF versus ALF depending upon level of care      Team Discussion:  Goals supervision-min assist level. R-inattention and impulsive in his movements. Difficulty clearing his right foot. Dys 1 thin liquid diet. Kinesis tape his R-foot to decrease swelling. More alert  today and able to participate more in therapies.  Working on activity tolerance in therapies.  Revisions to Treatment Plan:  Plan to go to NH from here    Continued Need for Acute Rehabilitation Level of Care: The patient requires daily medical management by a physician with specialized training in physical medicine and rehabilitation for the following conditions: Daily direction of a multidisciplinary physical rehabilitation program to ensure safe treatment while eliciting the highest outcome that is of practical value to the patient.: Yes Daily medical management of patient stability for increased activity during participation in an intensive rehabilitation regime.: Yes Daily analysis of laboratory values and/or radiology reports with any subsequent need for medication adjustment of medical intervention for : Neurological problems;Nutritional problems   Lucy Chris 02/14/2016, 9:48 AM       Patient ID: [name], male   DOB: 03/21/1960, 56 y.o.   MRN: [id number]

## 2016-02-21 ENCOUNTER — Inpatient Hospital Stay (HOSPITAL_COMMUNITY): Payer: Medicare Other | Admitting: Speech Pathology

## 2016-02-21 ENCOUNTER — Inpatient Hospital Stay (HOSPITAL_COMMUNITY): Payer: Medicare Other | Admitting: Physical Therapy

## 2016-02-21 ENCOUNTER — Inpatient Hospital Stay (HOSPITAL_COMMUNITY): Payer: Medicare Other | Admitting: Occupational Therapy

## 2016-02-21 ENCOUNTER — Ambulatory Visit (HOSPITAL_COMMUNITY): Payer: Medicare Other | Admitting: Physical Therapy

## 2016-02-21 LAB — GLUCOSE, CAPILLARY
GLUCOSE-CAPILLARY: 92 mg/dL (ref 65–99)
GLUCOSE-CAPILLARY: 123 mg/dL — AB (ref 65–99)
GLUCOSE-CAPILLARY: 193 mg/dL — AB (ref 65–99)
Glucose-Capillary: 133 mg/dL — ABNORMAL HIGH (ref 65–99)

## 2016-02-21 NOTE — Progress Notes (Signed)
Speech Language Pathology Daily Session Note  Patient Details  Name: [name] MRN: [id number] Date of Birth: April 19, 1960  Today's Date: 02/21/2016 SLP Individual Time: 0350-0938 SLP Individual Time Calculation (min): 26 min  Short Term Goals: Week 2: SLP Short Term Goal 1 (Week 2): Pt will consume dys 1 textures and thin liquids with supervision verbal cues for use of swallowing precautions and minimal overt s/s of aspiration.   SLP Short Term Goal 2 (Week 2): Pt will consume therapeutic trials of dys 2 textures with min verbal cues for use of swallowing precautions over 2 consecutive sessions prior to advancement.   SLP Short Term Goal 3 (Week 2): Pt will visually scan to the right of midline during basic functional tasks in >75% of opportunities with mod I.   SLP Short Term Goal 4 (Week 2): Pt will recognize and correct verbal errors in the moment during phrase/sentence level expression with supervision verbal cues.  SLP Short Term Goal 5 (Week 2): Pt will utilize increased vocal intensity and overarticulation to achieve intelligibility at the sentence level with min verbal cues.   SLP Short Term Goal 6 (Week 2): Pt will utilize compensatory word finding strategies at the phrase/sentence level with supervision verbal cues.    Skilled Therapeutic Interventions: Pt was seen for skilled ST targeting goals for cognition.  SLP facilitated the session with a novel card game to address visual scanning to the right of midline, attention to task, and functional problem solving.  Pt completed task for 100% accuracy with supervision verbal cues to recognize and correct errors.  He selectively attended to task in a mildly distracting environment for >20 minutes with supervision verbal cues for redirection.  No cues needed to locate cards to the right of midline.  Pt was left in recliner with call bell within reach.  Continue per current plan of care.       Function:  Eating Eating                 Cognition Comprehension Comprehension assist level: Understands basic 90% of the time/cues < 10% of the time  Expression   Expression assist level: Expresses basic 50 - 74% of the time/requires cueing 25 - 49% of the time. Needs to repeat parts of sentences.  Social Interaction Social Interaction assist level: Interacts appropriately 75 - 89% of the time - Needs redirection for appropriate language or to initiate interaction.  Problem Solving Problem solving assist level: Solves basic 50 - 74% of the time/requires cueing 25 - 49% of the time  Memory Memory assist level: Recognizes or recalls 50 - 74% of the time/requires cueing 25 - 49% of the time    Pain Pain Assessment Pain Assessment: No/denies pain  Therapy/Group: Individual Therapy  Breyon Sigg, Melanee Spry 02/21/2016, 11:50 AM

## 2016-02-21 NOTE — Progress Notes (Signed)
Occupational Therapy Session Note  Patient Details  Name: [name] MRN: [id number] Date of Birth: 12-Dec-1960  Today's Date: 02/21/2016 OT Individual Time: 1001-1056 OT Individual Time Calculation (min): 55 min    Short Term Goals: Week 2:  OT Short Term Goal 1 (Week 2): Pt will continue working toward established supervision level goals for discharge.    Skilled Therapeutic Interventions/Progress Updates:    Pt worked on self feeding from bedside recliner to start session.  Progressed to short distance mobility and standing balance at the sink in order to complete grooming tasks of washing his face and brushing his teeth.  Min guard assist for ambulation to the sink with close supervision for standing without rest break for 10-12 mins.  Encouraged use of the RUE with min assist for setting up toothbrush and for opening soap bottle.  Once finished, had pt ambulate back to the bedside recliner for further work on RUE reaching and picking up foam pieces to place in cup.  He needed mod facilitation to avoid leaning to the left when reaching above shoulder level as well as min assist to achieve shoulder flexion to place them in the cup.  Pt left with call button and phone in reach.    Therapy Documentation Precautions:  Precautions Precautions: Fall Precaution Comments: right sided weakness Restrictions Weight Bearing Restrictions: No  Pain: Pain Assessment Pain Assessment: No/denies pain ADL: ADL ADL Comments: Please see functional navigator   See Function Navigator for Current Functional Status.   Therapy/Group: Individual Therapy  Daishaun Ayre OTR/L 02/21/2016, 12:11 PM

## 2016-02-21 NOTE — Progress Notes (Signed)
Subjective/Complaints: No issues overnite, seen in PT no new issues   ROS: Denies CP, SOB, N/V/D.  Objective: Vital Signs: Blood pressure (!) 157/81, pulse 67, temperature 98.4 F (36.9 C), temperature source Oral, resp. rate 18, height 5\' 11"  (1.803 m), weight 95.1 kg (209 lb 10.5 oz), SpO2 99 %. No results found. Results for orders placed or performed during the hospital encounter of 02/11/16 (from the past 72 hour(s))  Glucose, capillary     Status: Abnormal   Collection Time: 02/18/16 11:31 AM  Result Value Ref Range   Glucose-Capillary 105 (H) 65 - 99 mg/dL  Glucose, capillary     Status: Abnormal   Collection Time: 02/18/16  4:44 PM  Result Value Ref Range   Glucose-Capillary 122 (H) 65 - 99 mg/dL  Glucose, capillary     Status: Abnormal   Collection Time: 02/18/16  8:48 PM  Result Value Ref Range   Glucose-Capillary 144 (H) 65 - 99 mg/dL  Glucose, capillary     Status: None   Collection Time: 02/19/16  6:42 AM  Result Value Ref Range   Glucose-Capillary 65 65 - 99 mg/dL   Comment 1 Notify RN   Glucose, capillary     Status: None   Collection Time: 02/19/16  7:36 AM  Result Value Ref Range   Glucose-Capillary 90 65 - 99 mg/dL  Glucose, capillary     Status: Abnormal   Collection Time: 02/19/16 11:20 AM  Result Value Ref Range   Glucose-Capillary 125 (H) 65 - 99 mg/dL  Glucose, capillary     Status: Abnormal   Collection Time: 02/19/16  5:03 PM  Result Value Ref Range   Glucose-Capillary 118 (H) 65 - 99 mg/dL  Glucose, capillary     Status: Abnormal   Collection Time: 02/19/16  8:32 PM  Result Value Ref Range   Glucose-Capillary 150 (H) 65 - 99 mg/dL  Glucose, capillary     Status: None   Collection Time: 02/20/16  6:47 AM  Result Value Ref Range   Glucose-Capillary 71 65 - 99 mg/dL  Glucose, capillary     Status: Abnormal   Collection Time: 02/20/16 11:42 AM  Result Value Ref Range   Glucose-Capillary 111 (H) 65 - 99 mg/dL  Glucose, capillary     Status:  Abnormal   Collection Time: 02/20/16  4:46 PM  Result Value Ref Range   Glucose-Capillary 114 (H) 65 - 99 mg/dL  Glucose, capillary     Status: Abnormal   Collection Time: 02/20/16  8:52 PM  Result Value Ref Range   Glucose-Capillary 186 (H) 65 - 99 mg/dL  Glucose, capillary     Status: None   Collection Time: 02/21/16  6:37 AM  Result Value Ref Range   Glucose-Capillary 92 65 - 99 mg/dL     HEENT: Normocephaic, atraumatic Cardio: RRR. No JVD Resp: clear without wheezes. Unlabored. GI: BS positive and NT, ND Skin:   Intact. Warm and dry.  Neuro: alert.   Flat Motor: 4/5 LUE proximal to distal LLE: 4/5 proxima to distal  RUE: 4-/5 proximal to distal RLE: 4-/5 HF KE ADF Dysarthric speech  Musc/Skel:  No tenderness. Right foot Edema, no pain with bilateral shoulder ROMGen NAD. Vital signs reviewed.    Assessment/Plan: 1. Functional deficits secondary to Left MCA infarct with aphasia, Right hemiparesis, dysarthria which require 3+ hours per day of interdisciplinary therapy in a comprehensive inpatient rehab setting. Physiatrist is providing close team supervision and 24 hour management of active medical  problems listed below. Physiatrist and rehab team continue to assess barriers to discharge/monitor patient progress toward functional and medical goals. FIM: Function - Bathing Position: Wheelchair/chair at sink Body parts bathed by patient: Right arm, Chest, Abdomen, Front perineal area, Right upper leg, Left upper leg, Left lower leg, Buttocks Body parts bathed by helper: Back, Left arm Bathing not applicable: Right lower leg (Pt with lymphedema wrapping) Assist Level: Touching or steadying assistance(Pt > 75%)  Function- Upper Body Dressing/Undressing What is the patient wearing?: Pull over shirt/dress Pull over shirt/dress - Perfomed by patient: Thread/unthread left sleeve, Put head through opening, Pull shirt over trunk Pull over shirt/dress - Perfomed by helper:  Thread/unthread right sleeve Assist Level: Touching or steadying assistance(Pt > 75%) Function - Lower Body Dressing/Undressing What is the patient wearing?: Underwear, Pants, Socks Position: Wheelchair/chair at sink Underwear - Performed by patient: Thread/unthread left underwear leg, Thread/unthread right underwear leg, Pull underwear up/down Underwear - Performed by helper: Thread/unthread right underwear leg Pants- Performed by patient: Thread/unthread right pants leg, Thread/unthread left pants leg, Pull pants up/down Pants- Performed by helper: Thread/unthread right pants leg Non-skid slipper socks- Performed by patient: Don/doff left sock Non-skid slipper socks- Performed by helper: Don/doff left sock, Don/doff right sock TED Hose - Performed by helper: Don/doff left TED hose Assist for footwear: Dependant Assist for lower body dressing:  (Total A)  Function - Toileting Toileting activity did not occur: No continent bowel/bladder event Toileting steps completed by patient: Adjust clothing prior to toileting, Performs perineal hygiene, Adjust clothing after toileting Toileting steps completed by helper: Performs perineal hygiene, Adjust clothing after toileting Toileting Assistive Devices: Grab bar or rail, Other (comment) Assist level: Touching or steadying assistance (Pt.75%)  Function - Archivist transfer activity did not occur: Refused Toilet transfer assistive device: Elevated toilet seat/BSC over toilet, Walker Assist level to toilet: Touching or steadying assistance (Pt > 75%) Assist level from toilet: Touching or steadying assistance (Pt > 75%)  Function - Chair/bed transfer Chair/bed transfer method: Stand pivot Chair/bed transfer assist level: Touching or steadying assistance (Pt > 75%) Chair/bed transfer assistive device: Armrests, Walker Chair/bed transfer details: Verbal cues for precautions/safety  Function - Locomotion: Wheelchair Will patient use  wheelchair at discharge?: Yes Type: Manual Max wheelchair distance: 100 Assist Level: Touching or steadying assistance (Pt > 75%) Assist Level: Touching or steadying assistance (Pt > 75%) Turns around,maneuvers to table,bed, and toilet,negotiates 3% grade,maneuvers on rugs and over doorsills: No Function - Locomotion: Ambulation Assistive device: Walker-rolling Max distance: 180 Assist level: Supervision or verbal cues Assist level: Supervision or verbal cues Assist level: Supervision or verbal cues Assist level: Supervision or verbal cues Walk 10 feet on uneven surfaces activity did not occur: Safety/medical concerns Assist level: Moderate assist (Pt 50 - 74%)  Function - Comprehension Comprehension: Auditory Comprehension assist level: Understands basic 90% of the time/cues < 10% of the time  Function - Expression Expression: Verbal Expression assist level: Expresses basic 50 - 74% of the time/requires cueing 25 - 49% of the time. Needs to repeat parts of sentences.  Function - Social Interaction Social Interaction assist level: Interacts appropriately 75 - 89% of the time - Needs redirection for appropriate language or to initiate interaction.  Function - Problem Solving Problem solving assist level: Solves basic 50 - 74% of the time/requires cueing 25 - 49% of the time  Function - Memory Memory assist level: Recognizes or recalls 75 - 89% of the time/requires cueing 10 - 24% of the time Patient  normally able to recall (first 3 days only): Current season, That he or she is in a hospital  Medical Problem List and Plan: 1.  Right hemiparesis, aphasia, dysphagia dysarthria secondary to left MCA infarct  Cont CIR PT, OT SLP- plan is for SNF  2.  DVT Prophylaxis/Anticoagulation: Lovenox 40 mg daily 3. Pain Management: Tylenol as needed, left shoulder pain resolved 4. Mood: Celexa 40 mg daily 5. Neuropsych: This patient is capable of making decisions on he is own behalf. 6.  Skin/Wound Care: Routine skin checks 7. Fluids/Electrolytes/Nutrition:               Dysphagia 1 thin, advance as tolerated 8. Acute on chronic combined congestive heart failure. Continue Lasix. Monitor for any signs of fluid overload.  -RLE swelling chronic lymphedema not CHF related  -BMP reviewed, no worsening of renal fxn on increased diuretic Filed Weights   02/19/16 0614 02/20/16 0523 02/21/16 0610  Weight: 95.1 kg (209 lb 10.5 oz) 95.5 kg (210 lb 8.6 oz) 95.1 kg (209 lb 10.5 oz)  9. Hypertension. Cardura 4 mg daily, Imdur 30 mg daily, Coreg 50 mg twice a day, lisinopril 20 mg daily. Monitor with increased mobility, at home also took Amlodipine, apresoline, aldactone. Improving on amlodipine 5mg   Vitals:   02/20/16 1505 02/21/16 0610  BP: (!) 143/84 (!) 157/81  Pulse: (!) 59 67  Resp: 18 18  Temp: 98.9 F (37.2 C) 98.4 F (36.9 C)    10. Diabetes mellitus with peripheral neuropathy. Hemoglobin A1c 6.4. Check blood sugars before meals and at bedtime. Diabetic teaching   CBGs am values improving  CBG (last 3)   Recent Labs  02/20/16 1646 02/20/16 2052 02/21/16 0637  GLUCAP 114* 186* 92    11. Ulcerative colitis. Sulfasalazine 2000 mg twice a day, no diarrhea, cont of bowel 12. BPH. Flomax 0.4 mg daily at bedtime. Check PVR 3 13. Hyperlipidemia. Lipitor 14. CRI stage III. Creatinine 1.50-1.58, remains, avoid NSAIDs and Metformin, proteinuria noted  BMP stable 15. LE edema/Lymphedema: Cont diuretics and compression. Hypoalbuminemia is contributing--added prostat  LOS (Days) 10 A FACE TO FACE EVALUATION WAS PERFORMED  [name] E 02/21/2016, 8:51 AM

## 2016-02-21 NOTE — Progress Notes (Signed)
Physical Therapy Session Note  Patient Details  Name: [name] MRN: [id number] Date of Birth: 03/26/1960  Today's Date: 02/21/2016 PT Individual Time: 0800-0915 PT Individual Time Calculation (min): 75 min   Short Term Goals: Week 1:  PT Short Term Goal 1 (Week 1): Patient will transfer bed <> wheelchair with min A using LRAD. PT Short Term Goal 1 - Progress (Week 1): Met PT Short Term Goal 2 (Week 1): Patient will ambulate 150 ft using LRAD with min A.  PT Short Term Goal 2 - Progress (Week 1): Met PT Short Term Goal 3 (Week 1): Patient will negotiate up/down 12 stairs using 2 rails with min A for strengthening.  PT Short Term Goal 3 - Progress (Week 1): Met PT Short Term Goal 4 (Week 1): Patient will maintain dynamic standing balance x 2 min with min A. PT Short Term Goal 4 - Progress (Week 1): Met Week 2:  PT Short Term Goal 1 (Week 2): STG = LTG's due to ELOS.  Skilled Therapeutic Interventions/Progress Updates:   Pt received supine in bed and agreeable to PT. Supine>sit transfer with supervision assist and mod cues from PT for LE  Sequencing.   Sit<>stand from bed with min assist from PT. Gait to toilet with min assist. One posterior LOB noted with assist from PT to correct. Toilet transfer with min assist. BM and continent urination. Patient able to perform perineal hygiene with supervision assist with significant increase in time and cues.   Gait training in hall x 147f, 1519fx 2 and 16078fith RW and R hand splint PT provided supervision assist and min cues for improved foot clearance throughout gait.   Car transfer with min assist from PT and RW. Min cues for LE management and UE placement with sit>stand to exit car  Stairs with close supervision assist x 12 and BUE support. Min cues for step to gait pattern and improved attention to the RUE.   Gait over uneven surface with RW and min assist from PT. Min cues for improved foot clearance.   Patient returned too room  and left sitting in recliner with call bell in reach and all needs met.         Therapy Documentation Precautions:  Precautions Precautions: Fall Precaution Comments: right sided weakness Restrictions Weight Bearing Restrictions: No General:   Vital Signs: Therapy Vitals Temp: 98.4 F (36.9 C) Temp Source: Oral Pulse Rate: 67 Resp: 18 BP: (!) 157/81 Patient Position (if appropriate): Lying Oxygen Therapy SpO2: 99 % O2 Device: Not Delivered Pain:   0/10   See Function Navigator for Current Functional Status.   Therapy/Group: Individual Therapy  AusLorie Phenix18/2018, 9:21 AM

## 2016-02-21 NOTE — Progress Notes (Signed)
Speech Language Pathology Daily Session Note  Patient Details  Name: [name] MRN: [id number] Date of Birth: [birth year]  Today's Date: 02/21/2016 SLP Individual Time: 1415-1445 SLP Individual Time Calculation (min): 30 min  Short Term Goals: Week 2: SLP Short Term Goal 1 (Week 2): Pt will consume dys 1 textures and thin liquids with supervision verbal cues for use of swallowing precautions and minimal overt s/s of aspiration.   SLP Short Term Goal 2 (Week 2): Pt will consume therapeutic trials of dys 2 textures with min verbal cues for use of swallowing precautions over 2 consecutive sessions prior to advancement.   SLP Short Term Goal 3 (Week 2): Pt will visually scan to the right of midline during basic functional tasks in >75% of opportunities with mod I.   SLP Short Term Goal 4 (Week 2): Pt will recognize and correct verbal errors in the moment during phrase/sentence level expression with supervision verbal cues.  SLP Short Term Goal 5 (Week 2): Pt will utilize increased vocal intensity and overarticulation to achieve intelligibility at the sentence level with min verbal cues.   SLP Short Term Goal 6 (Week 2): Pt will utilize compensatory word finding strategies at the phrase/sentence level with supervision verbal cues.    Skilled Therapeutic Interventions: Skilled treatment session focused on speech goals. SLP facilitated session by providing Min A to supervision cues for visually scanning to right of midline during basic functional tasks in ~>75% of opportunities. Pt required Mod A verbal cues to utilize increased vocal intensity and overarticulation to achieve intelligibility at the sentence level for ~80% intelligibility. Pt able to utilize word finding strategies at the sentence level with Min A verbal cues.  Pt was left in recliner chair with all needs within reach. Continue per current plan of care.      Function:  Eating Eating     Eating Assist Level: Set up assist  for;Supervision or verbal cues           Cognition Comprehension Comprehension assist level: Understands basic 75 - 89% of the time/ requires cueing 10 - 24% of the time  Expression   Expression assist level: Expresses basic 50 - 74% of the time/requires cueing 25 - 49% of the time. Needs to repeat parts of sentences.  Social Interaction Social Interaction assist level: Interacts appropriately 75 - 89% of the time - Needs redirection for appropriate language or to initiate interaction.  Problem Solving Problem solving assist level: Solves basic 50 - 74% of the time/requires cueing 25 - 49% of the time  Memory Memory assist level: Recognizes or recalls 50 - 74% of the time/requires cueing 25 - 49% of the time    Pain Pain Assessment Pain Assessment: No/denies pain  Therapy/Group: Individual Therapy  Lynk Marti 02/21/2016, 3:20 PM

## 2016-02-22 ENCOUNTER — Inpatient Hospital Stay (HOSPITAL_COMMUNITY): Payer: Medicare Other | Admitting: Physical Therapy

## 2016-02-22 ENCOUNTER — Inpatient Hospital Stay (HOSPITAL_COMMUNITY): Payer: Medicare Other | Admitting: Occupational Therapy

## 2016-02-22 ENCOUNTER — Inpatient Hospital Stay (HOSPITAL_COMMUNITY): Payer: Medicare Other | Admitting: Speech Pathology

## 2016-02-22 LAB — GLUCOSE, CAPILLARY
GLUCOSE-CAPILLARY: 117 mg/dL — AB (ref 65–99)
Glucose-Capillary: 68 mg/dL (ref 65–99)
Glucose-Capillary: 99 mg/dL (ref 65–99)
Glucose-Capillary: 174 mg/dL — ABNORMAL HIGH (ref 65–99)

## 2016-02-22 NOTE — Progress Notes (Signed)
Subjective/Complaints: Appreciate lymphedema note No issues overnite   ROS: Denies CP, SOB, N/V/D.  Objective: Vital Signs: Blood pressure (!) 163/94, pulse (!) 59, temperature 98.9 F (37.2 C), temperature source Oral, resp. rate 18, height 5\' 11"  (1.803 m), weight 92.5 kg (204 lb), SpO2 100 %. No results found. Results for orders placed or performed during the hospital encounter of 02/11/16 (from the past 72 hour(s))  Glucose, capillary     Status: None   Collection Time: 02/19/16  7:36 AM  Result Value Ref Range   Glucose-Capillary 90 65 - 99 mg/dL  Glucose, capillary     Status: Abnormal   Collection Time: 02/19/16 11:20 AM  Result Value Ref Range   Glucose-Capillary 125 (H) 65 - 99 mg/dL  Glucose, capillary     Status: Abnormal   Collection Time: 02/19/16  5:03 PM  Result Value Ref Range   Glucose-Capillary 118 (H) 65 - 99 mg/dL  Glucose, capillary     Status: Abnormal   Collection Time: 02/19/16  8:32 PM  Result Value Ref Range   Glucose-Capillary 150 (H) 65 - 99 mg/dL  Glucose, capillary     Status: None   Collection Time: 02/20/16  6:47 AM  Result Value Ref Range   Glucose-Capillary 71 65 - 99 mg/dL  Glucose, capillary     Status: Abnormal   Collection Time: 02/20/16 11:42 AM  Result Value Ref Range   Glucose-Capillary 111 (H) 65 - 99 mg/dL  Glucose, capillary     Status: Abnormal   Collection Time: 02/20/16  4:46 PM  Result Value Ref Range   Glucose-Capillary 114 (H) 65 - 99 mg/dL  Glucose, capillary     Status: Abnormal   Collection Time: 02/20/16  8:52 PM  Result Value Ref Range   Glucose-Capillary 186 (H) 65 - 99 mg/dL  Glucose, capillary     Status: None   Collection Time: 02/21/16  6:37 AM  Result Value Ref Range   Glucose-Capillary 92 65 - 99 mg/dL  Glucose, capillary     Status: Abnormal   Collection Time: 02/21/16 11:28 AM  Result Value Ref Range   Glucose-Capillary 123 (H) 65 - 99 mg/dL  Glucose, capillary     Status: Abnormal   Collection  Time: 02/21/16  4:22 PM  Result Value Ref Range   Glucose-Capillary 133 (H) 65 - 99 mg/dL  Glucose, capillary     Status: Abnormal   Collection Time: 02/21/16  9:21 PM  Result Value Ref Range   Glucose-Capillary 193 (H) 65 - 99 mg/dL     HEENT: Normocephaic, atraumatic Cardio: RRR. No JVD Resp: clear without wheezes. Unlabored. GI: BS positive and NT, ND Skin:   Intact. Warm and dry.  Neuro: alert.   Flat Motor: 4/5 LUE proximal to distal LLE: 4/5 proxima to distal  RUE: 4-/5 proximal to distal RLE: 4-/5 HF KE ADF Dysarthric speech  Musc/Skel:  No tenderness. Right foot Edema, no pain with bilateral shoulder ROMGen NAD. Vital signs reviewed.    Assessment/Plan: 1. Functional deficits secondary to Left MCA infarct with aphasia, Right hemiparesis, dysarthria which require 3+ hours per day of interdisciplinary therapy in a comprehensive inpatient rehab setting. Physiatrist is providing close team supervision and 24 hour management of active medical problems listed below. Physiatrist and rehab team continue to assess barriers to discharge/monitor patient progress toward functional and medical goals. FIM: Function - Bathing Position: Wheelchair/chair at sink Body parts bathed by patient: Right arm, Chest, Abdomen, Front perineal area, Right  upper leg, Left upper leg, Left lower leg, Buttocks Body parts bathed by helper: Back, Left arm Bathing not applicable: Right lower leg (Pt with lymphedema wrapping) Assist Level: Touching or steadying assistance(Pt > 75%)  Function- Upper Body Dressing/Undressing What is the patient wearing?: Pull over shirt/dress Pull over shirt/dress - Perfomed by patient: Thread/unthread left sleeve, Put head through opening, Pull shirt over trunk Pull over shirt/dress - Perfomed by helper: Thread/unthread right sleeve Assist Level: Touching or steadying assistance(Pt > 75%) Function - Lower Body Dressing/Undressing What is the patient wearing?: Underwear,  Pants, Socks Position: Wheelchair/chair at sink Underwear - Performed by patient: Thread/unthread left underwear leg, Thread/unthread right underwear leg, Pull underwear up/down Underwear - Performed by helper: Thread/unthread right underwear leg Pants- Performed by patient: Thread/unthread right pants leg, Thread/unthread left pants leg, Pull pants up/down Pants- Performed by helper: Thread/unthread right pants leg Non-skid slipper socks- Performed by patient: Don/doff left sock Non-skid slipper socks- Performed by helper: Don/doff left sock, Don/doff right sock TED Hose - Performed by helper: Don/doff left TED hose Assist for footwear: Dependant Assist for lower body dressing:  (Total A)  Function - Toileting Toileting activity did not occur: No continent bowel/bladder event Toileting steps completed by patient: Adjust clothing prior to toileting, Performs perineal hygiene, Adjust clothing after toileting Toileting steps completed by helper: Performs perineal hygiene, Adjust clothing after toileting Toileting Assistive Devices: Grab bar or rail, Other (comment) Assist level: Touching or steadying assistance (Pt.75%)  Function - Archivist transfer activity did not occur: Refused Toilet transfer assistive device: Elevated toilet seat/BSC over toilet, Walker Assist level to toilet: Touching or steadying assistance (Pt > 75%) Assist level from toilet: Touching or steadying assistance (Pt > 75%)  Function - Chair/bed transfer Chair/bed transfer method: Stand pivot Chair/bed transfer assist level: Supervision or verbal cues Chair/bed transfer assistive device: Armrests, Walker Chair/bed transfer details: Verbal cues for precautions/safety  Function - Locomotion: Wheelchair Will patient use wheelchair at discharge?: No Type: Manual Wheelchair activity did not occur: N/A Max wheelchair distance: 100 Assist Level: Touching or steadying assistance (Pt > 75%) Wheel 50 feet  with 2 turns activity did not occur: N/A Assist Level: Touching or steadying assistance (Pt > 75%) Wheel 150 feet activity did not occur: N/A Turns around,maneuvers to table,bed, and toilet,negotiates 3% grade,maneuvers on rugs and over doorsills: No Function - Locomotion: Ambulation Assistive device: Walker-rolling Max distance: 150 Assist level: Supervision or verbal cues Assist level: Supervision or verbal cues Assist level: Supervision or verbal cues Assist level: Supervision or verbal cues Walk 10 feet on uneven surfaces activity did not occur: Safety/medical concerns Assist level: Touching or steadying assistance (Pt > 75%)  Function - Comprehension Comprehension: Auditory Comprehension assist level: Understands basic 75 - 89% of the time/ requires cueing 10 - 24% of the time  Function - Expression Expression: Verbal Expression assist level: Expresses basic 50 - 74% of the time/requires cueing 25 - 49% of the time. Needs to repeat parts of sentences.  Function - Social Interaction Social Interaction assist level: Interacts appropriately 75 - 89% of the time - Needs redirection for appropriate language or to initiate interaction.  Function - Problem Solving Problem solving assist level: Solves basic 50 - 74% of the time/requires cueing 25 - 49% of the time  Function - Memory Memory assist level: Recognizes or recalls 50 - 74% of the time/requires cueing 25 - 49% of the time Patient normally able to recall (first 3 days only): Current season, That he or  she is in a hospital  Medical Problem List and Plan: 1.  Right hemiparesis, aphasia, dysphagia dysarthria secondary to left MCA infarct  Cont CIR PT, OT SLP- plan is for SNF, stable medically if bed available  2.  DVT Prophylaxis/Anticoagulation: Lovenox 40 mg daily 3. Pain Management: Tylenol as needed,4. Mood: Celexa 40 mg daily 5. Neuropsych: This patient is capable of making decisions on he is own behalf. 6. Skin/Wound  Care: Routine skin checks 7. Fluids/Electrolytes/Nutrition:               Dysphagia 1 thin, advance as tolerated 8. Acute on chronic combined congestive heart failure. Continue Lasix. Monitor for any signs of fluid overload.  -RLE swelling chronic lymphedema not CHF related  -BMP reviewed, no worsening of renal fxn on increased diuretic Filed Weights   02/20/16 0523 02/21/16 0610 02/22/16 0627  Weight: 95.5 kg (210 lb 8.6 oz) 95.1 kg (209 lb 10.5 oz) 92.5 kg (204 lb)  9. Hypertension. Cardura 4 mg daily, Imdur 30 mg daily, Coreg 50 mg twice a day, lisinopril 20 mg daily. Monitor with increased mobility, at home also took Amlodipine, apresoline, aldactone. Still elevated on amlodipine 5mg , increase to 10mg  on 1/19  Vitals:   02/21/16 1410 02/22/16 0627  BP: (!) 148/78 (!) 163/94  Pulse: (!) 59 (!) 59  Resp: 18 18  Temp: 97.5 F (36.4 C) 98.9 F (37.2 C)    10. Diabetes mellitus with peripheral neuropathy. Hemoglobin A1c 6.4. Check blood sugars before meals and at bedtime. Diabetic teaching   CBGs am values improving  CBG (last 3)   Recent Labs  02/21/16 1128 02/21/16 1622 02/21/16 2121  GLUCAP 123* 133* 193*    11. Ulcerative colitis. Sulfasalazine 2000 mg twice a day, no diarrhea, cont of bowel 12. BPH. Flomax 0.4 mg daily at bedtime. Check PVR 3 13. Hyperlipidemia. Lipitor 14. CRI stage III. Creatinine 1.50-1.58, remains, avoid NSAIDs and Metformin, proteinuria noted  BMP stable 15. LE edema/Lymphedema: per PT, will d/c wraps and cont TEDs. Hypoalbuminemia is contributing--added prostat  LOS (Days) 11 A FACE TO FACE EVALUATION WAS PERFORMED  Hilliard Borges E 02/22/2016, 6:48 AM

## 2016-02-22 NOTE — Progress Notes (Signed)
Speech Language Pathology Daily Session Note  Patient Details  Name: [name] MRN: [id number] Date of Birth: Oct 05, 1960  Today's Date: 02/22/2016 SLP Individual Time: 1107-1205 SLP Individual Time Calculation (min): 58 min  Short Term Goals: Week 2: SLP Short Term Goal 1 (Week 2): Pt will consume dys 1 textures and thin liquids with supervision verbal cues for use of swallowing precautions and minimal overt s/s of aspiration.   SLP Short Term Goal 2 (Week 2): Pt will consume therapeutic trials of dys 2 textures with min verbal cues for use of swallowing precautions over 2 consecutive sessions prior to advancement.   SLP Short Term Goal 3 (Week 2): Pt will visually scan to the right of midline during basic functional tasks in >75% of opportunities with mod I.   SLP Short Term Goal 4 (Week 2): Pt will recognize and correct verbal errors in the moment during phrase/sentence level expression with supervision verbal cues.  SLP Short Term Goal 5 (Week 2): Pt will utilize increased vocal intensity and overarticulation to achieve intelligibility at the sentence level with min verbal cues.   SLP Short Term Goal 6 (Week 2): Pt will utilize compensatory word finding strategies at the phrase/sentence level with supervision verbal cues.    Skilled Therapeutic Interventions: Pt was seen for skilled ST targeting goals for communication and dysphagia.  SLP facilitated the session with a novel verbal description task to address use of word finding and intelligibility strategies.  Pt needed mod assist for word finding as well as to monitor and correct verbal errors during task.  Pt needed overall min assist verbal cues for increased vocal intensity and overarticulation to achieve intelligibility in at the sentence level.  Pt consumed dys 2 textures and thin liquids with supervision verbal cues for use of swallowing precautions to clear solids from the oral cavity.  Pt was returned to room and left in recliner  with call bell within reach.  Continue per current plan of care.         Function:  Eating Eating   Modified Consistency Diet: Yes Eating Assist Level: Supervision or verbal cues;Set up assist for   Eating Set Up Assist For: Opening containers       Cognition Comprehension Comprehension assist level: Understands basic 75 - 89% of the time/ requires cueing 10 - 24% of the time  Expression   Expression assist level: Expresses basic 50 - 74% of the time/requires cueing 25 - 49% of the time. Needs to repeat parts of sentences.  Social Interaction Social Interaction assist level: Interacts appropriately 75 - 89% of the time - Needs redirection for appropriate language or to initiate interaction.  Problem Solving Problem solving assist level: Solves basic 50 - 74% of the time/requires cueing 25 - 49% of the time  Memory Memory assist level: Recognizes or recalls 50 - 74% of the time/requires cueing 25 - 49% of the time    Pain Pain Assessment Pain Assessment: No/denies pain  Therapy/Group: Individual Therapy  Fadumo Heng, Melanee Spry 02/22/2016, 12:30 PM

## 2016-02-22 NOTE — Progress Notes (Signed)
Physical Therapy Session Note  Patient Details  Name: [name] MRN: [id number] Date of Birth: Jun 26, 1960  Today's Date: 02/22/2016 PT Individual Time:801-900 PT Individual Time Calculation: 59 min   Short Term Goals: Week 2:  PT Short Term Goal 1 (Week 2): STG = LTG's due to ELOS.  Skilled Therapeutic Interventions/Progress Updates:   Pt received supine in bed and agreeable to PTtrade off from RN. Supine>sit transfer with supervision assist and min cues for use of bed features. Sitting balance and fine motro control of the R UE to finish breakfast Gait in room to bathroom with supervision assist and RW. Toilet transfer with supervision assist from PT with min cues for safety.   PT instructed patient in gait through hall x 15f x 2 and supervision assist with only min cues for improved heel contact and increased hip flexion.   Stair training x 12 steps (6") with BUE support and supervision assist from PT. Min cues from PT for step to gait pattern.  Dynamic gait training to weave through 6 cones x 2. Supervision assist from PT with min cues for AD management.   Patient returned to room and left sitting in recliner with call bell in reach and all needs met.        Therapy Documentation Precautions:  Precautions Precautions: Fall Precaution Comments: right sided weakness Restrictions Weight Bearing Restrictions: No General:   Vital Signs: Therapy Vitals Temp: 98.9 F (37.2 C) Temp Source: Oral Pulse Rate: (!) 59 Resp: 18 BP: (!) 163/94 Patient Position (if appropriate): Lying Oxygen Therapy SpO2: 100 % O2 Device: Not Delivered Pain: 0/10 :     See Function Navigator for Current Functional Status.   Therapy/Group: Individual Therapy  ALorie Phenix1/19/2018, 7:54 AM

## 2016-02-22 NOTE — Progress Notes (Signed)
Occupational Therapy Session Note  Patient Details  Name: Hagop Makely MRN: [id number] Date of Birth: 03-Dec-1960  Today's Date: 02/22/2016 OT Individual Time: 1300-1415 OT Individual Time Calculation (min): 75 min    Short Term Goals: Week 2:  OT Short Term Goal 1 (Week 2): Pt will continue working toward established supervision level goals for discharge.    Skilled Therapeutic Interventions/Progress Updates:    Pt completed shower and dressing during session.  Min guard assist for all transfers and mobility using the RW for support.  He was able to complete most bathing with supervision but still requires min assist for washing the LUE using the right arm.  He was able to complete dressing sit to stand from the bedside chair with overall min guard assist as well except for TEDS which required total assist from therapist.  Had pt ambulate over to the sink and stand to complete oral hygiene.  Finished session sitting in bedside chair and having pt work on Product manager that was washed yesterday.  Call button and phone in reach at end of session.    Therapy Documentation Precautions:  Precautions Precautions: Fall Precaution Comments: right sided weakness Restrictions Weight Bearing Restrictions: No  Pain: Pain Assessment Pain Assessment: No/denies pain ADL: See Function Navigator for Current Functional Status.   Therapy/Group: Individual Therapy  Miamarie Moll OTR/L 02/22/2016, 3:39 PM

## 2016-02-22 NOTE — NC FL2 (Signed)
[place] MEDICAID FL2 LEVEL OF CARE SCREENING TOOL     IDENTIFICATION  Patient Name: [name] Birthdate: [birth year] Sex: male Admission Date (Current Location): 02/11/2016  [facility] and [place] Number:  Producer, television/film/video and Address:  [facility] [place], [address], [place], [place] [address]      Provider Number: [id number]  Attending Physician Name and Address:  Erick Colace, MD  Relative Name and Phone Number:  Mitchell-brother [phone]-cell    Current Level of Care: Other (Comment) (rehab) Recommended Level of Care: Skilled Nursing Facility Prior Approval Number:    Date Approved/Denied:   PASRR Number: [id number]  Discharge Plan: SNF    Current Diagnoses: Patient Active Problem List   Diagnosis Date Noted  . Acute on chronic combined systolic and diastolic heart failure (HCC)   . Right hemiparesis (HCC)   . 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 Expressive aphasia   . Left hemiparesis (HCC)   . Depression   . Dysphagia, post-stroke   . Benign essential HTN   . Type 2 diabetes mellitus with peripheral neuropathy (HCC)   . Ulcerative colitis with complication (HCC)   . Benign prostatic hyperplasia   . Hyperlipidemia   . Stage 3 chronic kidney disease   . Peripheral edema   . Cerebral infarction due to occlusion of left middle cerebral artery (HCC)   . Hypertension   . Right sided weakness   . Acute on chronic congestive heart failure (HCC)   . Stroke (cerebrum) (HCC) 02/05/2016  . Hypokalemia 02/05/2016  . Hypertensive urgency 02/05/2016  . Acute CVA (cerebrovascular accident) (HCC) 02/05/2016  . Anemia of chronic disease 05/21/2015  . Diabetes mellitus, type II, insulin dependent (HCC) 05/21/2015  . OSA ? compliacne 05/21/2015  . Homelessness 05/21/2015  . Weakness   . AKI (acute kidney injury) (HCC) 05/20/2015  . Essential hypertension 05/20/2015  . Acute on chronic combined systolic and  diastolic HF (heart failure) (HCC) 05/20/2015  . Weakness of right lower extremity 05/20/2015    Orientation RESPIRATION BLADDER Height & Weight     Self, Situation, Place  Normal Continent Weight: 204 lb (92.5 kg) Height:  5\' 11"  (180.3 cm)  BEHAVIORAL SYMPTOMS/MOOD NEUROLOGICAL BOWEL NUTRITION STATUS      Continent Diet (Dys 2 thin liquids)  AMBULATORY STATUS COMMUNICATION OF NEEDS Skin   Limited Assist Non-Verbally Normal                       Personal Care Assistance Level of Assistance  Bathing, Dressing, Feeding Bathing Assistance: Limited assistance Feeding assistance: Limited assistance Dressing Assistance: Limited assistance     Functional Limitations Warehouse manager, Speech Sight Info: Adequate   Speech Info: Impaired    SPECIAL CARE FACTORS FREQUENCY  PT (By licensed PT), OT (By licensed OT), Speech therapy     PT Frequency: 5x week OT Frequency: 5x week     Speech Therapy Frequency: 5x week      Contractures Contractures Info: Not present    Additional Factors Info                  Current Medications (02/22/2016):  This is the current hospital active medication list Current Facility-Administered Medications  Medication Dose Route Frequency Provider Last Rate Last Dose  . acetaminophen (TYLENOL) tablet 650 mg  650 mg Oral Q4H PRN Daniel J Angiulli, PA-C      . amLODipine (NORVASC) tablet 5 mg  5  mg Oral Daily Erick Colace, MD   5 mg at 02/22/16 0945  . aspirin tablet 325 mg  325 mg Oral Daily Mcarthur Rossetti Angiulli, PA-C   325 mg at 02/22/16 0943  . atorvastatin (LIPITOR) tablet 20 mg  20 mg Oral QHS Mcarthur Rossetti Angiulli, PA-C   20 mg at 02/21/16 2224  . bisacodyl (DULCOLAX) suppository 10 mg  10 mg Rectal Daily PRN Ankit Karis Juba, MD   10 mg at 02/17/16 0532  . calcium-vitamin D (OSCAL WITH D) 500-200 MG-UNIT per tablet 1 tablet  1 tablet Oral Daily Mcarthur Rossetti Angiulli, PA-C   1 tablet at 02/21/16 1339  . carvedilol (COREG) tablet 50 mg  50 mg  Oral BID WC Daniel J Angiulli, PA-C   50 mg at 02/22/16 0944  . citalopram (CELEXA) tablet 40 mg  40 mg Oral Daily Mcarthur Rossetti Angiulli, PA-C   40 mg at 02/21/16 1337  . clopidogrel (PLAVIX) tablet 75 mg  75 mg Oral Daily Mcarthur Rossetti Angiulli, PA-C   75 mg at 02/22/16 0945  . docusate sodium (COLACE) capsule 100 mg  100 mg Oral Daily PRN Mcarthur Rossetti Angiulli, PA-C      . doxazosin (CARDURA) tablet 4 mg  4 mg Oral QHS Daniel J Angiulli, PA-C   4 mg at 02/21/16 2224  . enoxaparin (LOVENOX) injection 40 mg  40 mg Subcutaneous Q24H Mcarthur Rossetti Angiulli, PA-C   40 mg at 02/21/16 2224  . feeding supplement (PRO-STAT SUGAR FREE 64) liquid 30 mL  30 mL Oral BID Erick Colace, MD   30 mL at 02/21/16 1005  . furosemide (LASIX) tablet 80 mg  80 mg Oral BID Ranelle Oyster, MD   80 mg at 02/22/16 0944  . insulin aspart (novoLOG) injection 0-9 Units  0-9 Units Subcutaneous TID AC & HS Mcarthur Rossetti Angiulli, PA-C   2 Units at 02/21/16 2244  . isosorbide dinitrate (ISORDIL) tablet 10 mg  10 mg Oral TID Mcarthur Rossetti Angiulli, PA-C   10 mg at 02/22/16 0945  . lisinopril (PRINIVIL,ZESTRIL) tablet 20 mg  20 mg Oral Daily Mcarthur Rossetti Angiulli, PA-C   20 mg at 02/22/16 0945  . magnesium oxide (MAG-OX) tablet 400 mg  400 mg Oral BID Mcarthur Rossetti Angiulli, PA-C   400 mg at 02/21/16 2224  . multivitamin with minerals tablet 1 tablet  1 tablet Oral Daily Mcarthur Rossetti Angiulli, PA-C   1 tablet at 02/21/16 1339  . ondansetron (ZOFRAN) tablet 4 mg  4 mg Oral Q6H PRN Mcarthur Rossetti Angiulli, PA-C       Or  . ondansetron (ZOFRAN) injection 4 mg  4 mg Intravenous Q6H PRN Daniel J Angiulli, PA-C      . pantoprazole (PROTONIX) EC tablet 40 mg  40 mg Oral Daily Mcarthur Rossetti Angiulli, PA-C   40 mg at 02/22/16 0944  . polyethylene glycol (MIRALAX / GLYCOLAX) packet 17 g  17 g Oral Daily PRN Mcarthur Rossetti Angiulli, PA-C   17 g at 02/16/16 2121  . potassium chloride SA (K-DUR,KLOR-CON) CR tablet 40 mEq  40 mEq Oral Daily Mcarthur Rossetti Angiulli, PA-C   40 mEq at 02/22/16 0944  .  sorbitol 70 % solution 30 mL  30 mL Oral Daily PRN Mcarthur Rossetti Angiulli, PA-C   30 mL at 02/16/16 1749  . sulfaSALAzine (AZULFIDINE) tablet 2,000 mg  2,000 mg Oral BID Mcarthur Rossetti Angiulli, PA-C   2,000 mg at 02/21/16 2225  . tamsulosin (FLOMAX) capsule 0.4  mg  0.4 mg Oral QHS Mcarthur Rossetti Angiulli, PA-C   0.4 mg at 02/21/16 2225     Discharge Medications: Please see discharge summary for a list of discharge medications.  Relevant Imaging Results:  Relevant Lab Results:   Additional Information SSN: [id number]  Jackee Glasner, Lemar Livings, LCSW

## 2016-02-22 NOTE — Progress Notes (Signed)
Social Work Patient ID: [name], male   DOB: 12-24-1960, 56 y.o.   MRN: [id number]   Met with pt and spoke with sister in-law-[name] via telephone to give update from team conference. Pt is making progress and aware of the plan for NH in the [place] area. Will begin the search brother has given this worker facilities to pursue. Brother to be here this weekend to visit pt.

## 2016-02-22 NOTE — Progress Notes (Signed)
Patient placed on CPAP and is tolerating well.  RT will continue monitor.

## 2016-02-23 ENCOUNTER — Inpatient Hospital Stay (HOSPITAL_COMMUNITY): Payer: Medicare Other | Admitting: Occupational Therapy

## 2016-02-23 LAB — GLUCOSE, CAPILLARY
GLUCOSE-CAPILLARY: 125 mg/dL — AB (ref 65–99)
GLUCOSE-CAPILLARY: 164 mg/dL — AB (ref 65–99)
Glucose-Capillary: 95 mg/dL (ref 65–99)
Glucose-Capillary: 111 mg/dL — ABNORMAL HIGH (ref 65–99)

## 2016-02-23 MED ORDER — POTASSIUM CHLORIDE 20 MEQ PO PACK
40.0000 meq | PACK | Freq: Every day | ORAL | Status: DC
  Administered 2016-02-23 – 2016-02-27 (×5): 40 meq via ORAL
  Filled 2016-02-23 (×5): qty 2

## 2016-02-23 MED ORDER — POTASSIUM CHLORIDE 20 MEQ/15ML (10%) PO SOLN: 40.0000 meq | Freq: Every day | ORAL | Status: DC

## 2016-02-23 NOTE — Progress Notes (Signed)
Occupational Therapy Session Note  Patient Details  Name: [name] MRN: [id number] Date of Birth: May 26, 1960  Today's Date: 02/23/2016 OT Individual Time: 1000-1058 OT Individual Time Calculation (min): 58 min    Short Term Goals: Week 1:  OT Short Term Goal 1 (Week 1): Pt will don shirt using hemi-techniques with Mod A OT Short Term Goal 1 - Progress (Week 1): Met OT Short Term Goal 2 (Week 1): Pt will complete toilet transfer with Mod A OT Short Term Goal 2 - Progress (Week 1): Met OT Short Term Goal 3 (Week 1): Pt will recall hemi-dressing techniques 2 consecutive days with min questioning cues OT Short Term Goal 3 - Progress (Week 1): Met OT Short Term Goal 4 (Week 1): Pt will use the R UE as a stabilizer during functional activities with Min A OT Short Term Goal 4 - Progress (Week 1): Met OT Short Term Goal 5 (Week 1): Pt will continue working toward established supervision level goals for discharge.   Week 2:  OT Short Term Goal 1 (Week 2): Pt will continue working toward established supervision level goals for discharge.    Skilled Therapeutic Interventions/Progress Updates: Skilled OT session completed with focus on R UE NMR, ADL retraining, and cognition. Pt was in bathroom with nurse tech at time of arrival. He was agreeable to showering afterwards. Pt gathered clothing items using RW to ambulate to drawers, max cues for using R UE for retrieving items due to L UE preference. Bathing then completed with Min A in shower with cues for R UE integration and ankle over knee strategies for thoroughness with LEs. Dressing was then completed in recliner, standing as needed with RW and steady assist. Min cues for strapping R UE onto walker during sit<stand transitions. Mod cues for hemi techniques, clothing orientation, and utilizing ankle over knee strategies instead of bending forward. At end of session pt was repositioned for comfort in recliner and left with safety belt and RN at time  of departure.      Therapy Documentation Precautions:  Precautions Precautions: Fall Precaution Comments: right sided weakness Restrictions Weight Bearing Restrictions: No  Vital Signs: Therapy Vitals Temp: 97.7 F (36.5 C) Temp Source: Oral Pulse Rate: (!) 58 Resp: 17 BP: (!) 145/83 Patient Position (if appropriate): Sitting Oxygen Therapy SpO2: 98 % O2 Device: Not Delivered Pain: No c/o pain during session    ADL: ADL ADL Comments: Please see functional navigator    See Function Navigator for Current Functional Status.   Therapy/Group: Individual Therapy  Aldridge Krzyzanowski A Kayler Rise 02/23/2016, 4:06 PM

## 2016-02-23 NOTE — Progress Notes (Signed)
Subjective/Complaints: Up eating breafkast. No new problems  ROS: pt denies nausea, vomiting, diarrhea, cough, shortness of breath or chest pain\  Objective: Vital Signs: Blood pressure 127/80, pulse (!) 57, temperature 98.3 F (36.8 C), temperature source Oral, resp. rate 18, height 5\' 11"  (1.803 m), weight 92.6 kg (204 lb 2.3 oz), SpO2 100 %. No results found. Results for orders placed or performed during the hospital encounter of 02/11/16 (from the past 72 hour(s))  Glucose, capillary     Status: Abnormal   Collection Time: 02/20/16 11:42 AM  Result Value Ref Range   Glucose-Capillary 111 (H) 65 - 99 mg/dL  Glucose, capillary     Status: Abnormal   Collection Time: 02/20/16  4:46 PM  Result Value Ref Range   Glucose-Capillary 114 (H) 65 - 99 mg/dL  Glucose, capillary     Status: Abnormal   Collection Time: 02/20/16  8:52 PM  Result Value Ref Range   Glucose-Capillary 186 (H) 65 - 99 mg/dL  Glucose, capillary     Status: None   Collection Time: 02/21/16  6:37 AM  Result Value Ref Range   Glucose-Capillary 92 65 - 99 mg/dL  Glucose, capillary     Status: Abnormal   Collection Time: 02/21/16 11:28 AM  Result Value Ref Range   Glucose-Capillary 123 (H) 65 - 99 mg/dL  Glucose, capillary     Status: Abnormal   Collection Time: 02/21/16  4:22 PM  Result Value Ref Range   Glucose-Capillary 133 (H) 65 - 99 mg/dL  Glucose, capillary     Status: Abnormal   Collection Time: 02/21/16  9:21 PM  Result Value Ref Range   Glucose-Capillary 193 (H) 65 - 99 mg/dL  Glucose, capillary     Status: None   Collection Time: 02/22/16  6:59 AM  Result Value Ref Range   Glucose-Capillary 68 65 - 99 mg/dL  Glucose, capillary     Status: Abnormal   Collection Time: 02/22/16 11:50 AM  Result Value Ref Range   Glucose-Capillary 117 (H) 65 - 99 mg/dL  Glucose, capillary     Status: None   Collection Time: 02/22/16  4:48 PM  Result Value Ref Range   Glucose-Capillary 99 65 - 99 mg/dL   Glucose, capillary     Status: Abnormal   Collection Time: 02/22/16  8:56 PM  Result Value Ref Range   Glucose-Capillary 174 (H) 65 - 99 mg/dL   Comment 1 Notify RN   Glucose, capillary     Status: None   Collection Time: 02/23/16  6:44 AM  Result Value Ref Range   Glucose-Capillary 95 65 - 99 mg/dL   Comment 1 Notify RN      HEENT: Normocephaic, atraumatic Cardio: RRR Resp: clear bilat GI: BS positive and NT, ND Skin:   Intact. Warm and dry.  Neuro: alert.   Flat Motor: 4/5 LUE proximal to distal LLE: 4/5 proxima to distal  RUE: 4-/5 proximal to distal RLE: 4-/5 HF KE ADF Dysarthric speech  Musc/Skel:  No tenderness. Right foot Edema, no pain with bilateral shoulder ROMGen NAD.     Assessment/Plan: 1. Functional deficits secondary to Left MCA infarct with aphasia, Right hemiparesis, dysarthria which require 3+ hours per day of interdisciplinary therapy in a comprehensive inpatient rehab setting. Physiatrist is providing close team supervision and 24 hour management of active medical problems listed below. Physiatrist and rehab team continue to assess barriers to discharge/monitor patient progress toward functional and medical goals. FIM: Function - Bathing Position: Shower  Body parts bathed by patient: Right arm, Chest, Abdomen, Front perineal area, Right upper leg, Left upper leg, Left lower leg, Buttocks, Right lower leg Body parts bathed by helper: Left arm Bathing not applicable: Right lower leg (Pt with lymphedema wrapping) Assist Level: Touching or steadying assistance(Pt > 75%)  Function- Upper Body Dressing/Undressing What is the patient wearing?: Pull over shirt/dress Pull over shirt/dress - Perfomed by patient: Thread/unthread left sleeve, Put head through opening, Pull shirt over trunk, Thread/unthread right sleeve Pull over shirt/dress - Perfomed by helper: Thread/unthread right sleeve Assist Level: Supervision or verbal cues Function - Lower Body  Dressing/Undressing What is the patient wearing?: Underwear, Pants, Non-skid slipper socks, Ted Hose Position:  (bedside chair) Underwear - Performed by patient: Thread/unthread left underwear leg, Thread/unthread right underwear leg, Pull underwear up/down Underwear - Performed by helper: Thread/unthread right underwear leg Pants- Performed by patient: Thread/unthread right pants leg, Thread/unthread left pants leg, Pull pants up/down Pants- Performed by helper: Thread/unthread right pants leg Non-skid slipper socks- Performed by patient: Don/doff right sock, Don/doff left sock Non-skid slipper socks- Performed by helper: Don/doff left sock, Don/doff right sock TED Hose - Performed by helper: Don/doff right TED hose, Don/doff left TED hose Assist for footwear: Dependant Assist for lower body dressing:  (Total A)  Function - Toileting Toileting activity did not occur: No continent bowel/bladder event Toileting steps completed by patient: Adjust clothing prior to toileting, Performs perineal hygiene, Adjust clothing after toileting Toileting steps completed by helper: Performs perineal hygiene, Adjust clothing after toileting Toileting Assistive Devices: Grab bar or rail, Other (comment) Assist level: Touching or steadying assistance (Pt.75%)  Function - Archivist transfer activity did not occur: Refused Toilet transfer assistive device: Elevated toilet seat/BSC over toilet, Walker Assist level to toilet: Touching or steadying assistance (Pt > 75%) Assist level from toilet: Touching or steadying assistance (Pt > 75%)  Function - Chair/bed transfer Chair/bed transfer method: Stand pivot Chair/bed transfer assist level: Supervision or verbal cues Chair/bed transfer assistive device: Armrests, Walker Chair/bed transfer details: Verbal cues for precautions/safety  Function - Locomotion: Wheelchair Will patient use wheelchair at discharge?: No Type: Manual Wheelchair  activity did not occur: N/A Max wheelchair distance: 100 Assist Level: Touching or steadying assistance (Pt > 75%) Wheel 50 feet with 2 turns activity did not occur: N/A Assist Level: Touching or steadying assistance (Pt > 75%) Wheel 150 feet activity did not occur: N/A Turns around,maneuvers to table,bed, and toilet,negotiates 3% grade,maneuvers on rugs and over doorsills: No Function - Locomotion: Ambulation Assistive device: Walker-rolling Max distance: 150 Assist level: Supervision or verbal cues Assist level: Supervision or verbal cues Assist level: Supervision or verbal cues Assist level: Supervision or verbal cues Walk 10 feet on uneven surfaces activity did not occur: Safety/medical concerns Assist level: Touching or steadying assistance (Pt > 75%)  Function - Comprehension Comprehension: Auditory Comprehension assist level: Understands basic 90% of the time/cues < 10% of the time  Function - Expression Expression: Verbal Expression assist level: Expresses basic 50 - 74% of the time/requires cueing 25 - 49% of the time. Needs to repeat parts of sentences.  Function - Social Interaction Social Interaction assist level: Interacts appropriately 75 - 89% of the time - Needs redirection for appropriate language or to initiate interaction.  Function - Problem Solving Problem solving assist level: Solves basic 50 - 74% of the time/requires cueing 25 - 49% of the time  Function - Memory Memory assist level: Recognizes or recalls 25 - 49% of the  time/requires cueing 50 - 75% of the time Patient normally able to recall (first 3 days only): Current season, That he or she is in a hospital  Medical Problem List and Plan: 1.  Right hemiparesis, aphasia, dysphagia dysarthria secondary to left MCA infarct  Cont CIR PT, OT SLP- plan is for SNF in [place] area  2.  DVT Prophylaxis/Anticoagulation: Lovenox 40 mg daily 3. Pain Management: Tylenol as needed,4. Mood: Celexa 40 mg daily 5.  Neuropsych: This patient is capable of making decisions on he is own behalf. 6. Skin/Wound Care: Routine skin checks 7. Fluids/Electrolytes/Nutrition:               Dysphagia 1 thin, advance as tolerated 8. Acute on chronic combined congestive heart failure. Continue Lasix. Monitor for any signs of fluid overload.  -RLE swelling chronic lymphedema not CHF related  -BMP reviewed, no worsening of renal fxn on increased diuretic Filed Weights   02/21/16 0610 02/22/16 0627 02/23/16 0553  Weight: 95.1 kg (209 lb 10.5 oz) 92.5 kg (204 lb) 92.6 kg (204 lb 2.3 oz)  9. Hypertension. Cardura 4 mg daily, Imdur 30 mg daily, Coreg 50 mg twice a day, lisinopril 20 mg daily.  , at home also took Amlodipine, apresoline, aldactone. Still elevated on amlodipine 5mg , increased to 10mg  on 1/19  -improved control Vitals:   02/22/16 2334 02/23/16 0553  BP: (!) 184/95 127/80  Pulse:  (!) 57  Resp:  18  Temp:  98.3 F (36.8 C)    10. Diabetes mellitus with peripheral neuropathy. Hemoglobin A1c 6.4. Check blood sugars before meals and at bedtime. Diabetic teaching   CBGs am values improving  CBG (last 3)   Recent Labs  02/22/16 1648 02/22/16 2056 02/23/16 0644  GLUCAP 99 174* 95    11. Ulcerative colitis. Sulfasalazine 2000 mg twice a day, no diarrhea, cont of bowel 12. BPH. Flomax 0.4 mg daily at bedtime. Check PVR 3 13. Hyperlipidemia. Lipitor 14. CRI stage III. Creatinine 1.50-1.58, remains, avoid NSAIDs and Metformin, proteinuria noted  BMP stable 15. LE edema/Lymphedema: per PT, will d/c wraps and cont TEDs. Hypoalbuminemia is contributing-  -continue prostat  LOS (Days) 12 A FACE TO FACE EVALUATION WAS PERFORMED  Shirlee Whitmire T 02/23/2016, 8:11 AM

## 2016-02-24 ENCOUNTER — Inpatient Hospital Stay (HOSPITAL_COMMUNITY): Payer: Medicare Other | Admitting: Physical Therapy

## 2016-02-24 LAB — GLUCOSE, CAPILLARY
Glucose-Capillary: 86 mg/dL (ref 65–99)
Glucose-Capillary: 104 mg/dL — ABNORMAL HIGH (ref 65–99)
Glucose-Capillary: 114 mg/dL — ABNORMAL HIGH (ref 65–99)
Glucose-Capillary: 142 mg/dL — ABNORMAL HIGH (ref 65–99)

## 2016-02-24 NOTE — Progress Notes (Signed)
Patient placed on CPAP and is tolerating well at this time. Will monitor as needed. 

## 2016-02-24 NOTE — Progress Notes (Addendum)
Physical Therapy Session Note  Patient Details  Name: [name] MRN: [id number] Date of Birth: 1960-03-24  Today's Date: 02/24/2016 PT Individual Time: 0933-1020 PT Individual Time Calculation (min): 47 min   Short Term Goals: Week 2:  PT Short Term Goal 1 (Week 2): STG = LTG's due to ELOS.  Skilled Therapeutic Interventions/Progress Updates: Pt received in room with RN present & agreeable to tx, denying c/o pain. While RN removed condom catheter pt stood without BUE support and min assist for balance 2/2 posterior/right lean. Pt ambulated room<>gym with RUE & min assist with therapist providing manual facilitation for weight shifting L & cuing for increased step length & foot clearance RLE. Pt performed car (Jeep simulated height) and couch (low, compliant surface) transfers with steady assist and cuing for technique. Pt continues to require cuing for management of RUE on hand orthosis on RW. On mat table pt performed bridging with adductor hold and RLE single leg bridging with therapist providing multimodal cuing for proper alignment of RLE as pt has difficulty preventing abduction.  Pt able to roll supine<>prone with min assist and cuing; pt attempted to achieve quadruped position but unable to achieve nor maintain proper positioning. Pt returned to room & transferred into bed. Pt left in bed with all needs within reach & bed alarm set.      Therapy Documentation Precautions:  Precautions Precautions: Fall Precaution Comments: right sided weakness Restrictions Weight Bearing Restrictions: No   See Function Navigator for Current Functional Status.   Therapy/Group: Individual Therapy  Sandi Mariscal 02/24/2016, 12:36 PM

## 2016-02-24 NOTE — Progress Notes (Signed)
Subjective/Complaints: Up eating breafkast again. Pocketing some food on right  ROS: pt denies nausea, vomiting, diarrhea, cough, shortness of breath or chest pain   Objective: Vital Signs: Blood pressure (!) 168/80, pulse 68, temperature 97.5 F (36.4 C), temperature source Oral, resp. rate 18, height 5\' 11"  (1.803 m), weight 93.3 kg (205 lb 11 oz), SpO2 98 %. No results found. Results for orders placed or performed during the hospital encounter of 02/11/16 (from the past 72 hour(s))  Glucose, capillary     Status: Abnormal   Collection Time: 02/21/16 11:28 AM  Result Value Ref Range   Glucose-Capillary 123 (H) 65 - 99 mg/dL  Glucose, capillary     Status: Abnormal   Collection Time: 02/21/16  4:22 PM  Result Value Ref Range   Glucose-Capillary 133 (H) 65 - 99 mg/dL  Glucose, capillary     Status: Abnormal   Collection Time: 02/21/16  9:21 PM  Result Value Ref Range   Glucose-Capillary 193 (H) 65 - 99 mg/dL  Glucose, capillary     Status: None   Collection Time: 02/22/16  6:59 AM  Result Value Ref Range   Glucose-Capillary 68 65 - 99 mg/dL  Glucose, capillary     Status: Abnormal   Collection Time: 02/22/16 11:50 AM  Result Value Ref Range   Glucose-Capillary 117 (H) 65 - 99 mg/dL  Glucose, capillary     Status: None   Collection Time: 02/22/16  4:48 PM  Result Value Ref Range   Glucose-Capillary 99 65 - 99 mg/dL  Glucose, capillary     Status: Abnormal   Collection Time: 02/22/16  8:56 PM  Result Value Ref Range   Glucose-Capillary 174 (H) 65 - 99 mg/dL   Comment 1 Notify RN   Glucose, capillary     Status: None   Collection Time: 02/23/16  6:44 AM  Result Value Ref Range   Glucose-Capillary 95 65 - 99 mg/dL   Comment 1 Notify RN   Glucose, capillary     Status: Abnormal   Collection Time: 02/23/16 11:51 AM  Result Value Ref Range   Glucose-Capillary 111 (H) 65 - 99 mg/dL  Glucose, capillary     Status: Abnormal   Collection Time: 02/23/16  5:07 PM  Result  Value Ref Range   Glucose-Capillary 125 (H) 65 - 99 mg/dL  Glucose, capillary     Status: Abnormal   Collection Time: 02/23/16  8:25 PM  Result Value Ref Range   Glucose-Capillary 164 (H) 65 - 99 mg/dL  Glucose, capillary     Status: None   Collection Time: 02/24/16  6:28 AM  Result Value Ref Range   Glucose-Capillary 86 65 - 99 mg/dL     HEENT: Normocephaic, atraumatic Cardio: RRR Resp: CTA bilat GI: BS positive and NT, ND Skin:   Intact. Warm and dry.  Neuro: alert.   Flat Motor: 4/5 LUE proximal to distal LLE: 4/5 proxima to distal  RUE: 4-/5 proximal to distal RLE: 4-/5 HF KE ADF Dysarthric speech but intelligbile  Musc/Skel:  No tenderness. Right foot Edema, no pain with bilateral shoulder ROMGen NAD.     Assessment/Plan: 1. Functional deficits secondary to Left MCA infarct with aphasia, Right hemiparesis, dysarthria which require 3+ hours per day of interdisciplinary therapy in a comprehensive inpatient rehab setting. Physiatrist is providing close team supervision and 24 hour management of active medical problems listed below. Physiatrist and rehab team continue to assess barriers to discharge/monitor patient progress toward functional and medical goals.  FIM: Function - Bathing Position: Shower Body parts bathed by patient: Right arm, Chest, Abdomen, Front perineal area, Right upper leg, Left upper leg, Left lower leg, Buttocks, Right lower leg Body parts bathed by helper: Left arm, Back Bathing not applicable: Right lower leg (Pt with lymphedema wrapping) Assist Level: Touching or steadying assistance(Pt > 75%)  Function- Upper Body Dressing/Undressing What is the patient wearing?: Pull over shirt/dress Pull over shirt/dress - Perfomed by patient: Thread/unthread left sleeve, Put head through opening, Pull shirt over trunk, Thread/unthread right sleeve Pull over shirt/dress - Perfomed by helper: Thread/unthread right sleeve Assist Level: Supervision or verbal  cues Function - Lower Body Dressing/Undressing What is the patient wearing?: Underwear, Pants, Non-skid slipper socks, Ted Hose Position:  (bedside chair) Underwear - Performed by patient: Thread/unthread left underwear leg, Thread/unthread right underwear leg, Pull underwear up/down Underwear - Performed by helper: Thread/unthread right underwear leg Pants- Performed by patient: Thread/unthread right pants leg, Thread/unthread left pants leg, Pull pants up/down Pants- Performed by helper: Thread/unthread right pants leg Non-skid slipper socks- Performed by patient: Don/doff right sock, Don/doff left sock Non-skid slipper socks- Performed by helper: Don/doff left sock, Don/doff right sock TED Hose - Performed by helper: Don/doff right TED hose, Don/doff left TED hose Assist for footwear: Supervision/touching assist Assist for lower body dressing: Touching or steadying assistance (Pt > 75%)  Function - Toileting Toileting activity did not occur: No continent bowel/bladder event Toileting steps completed by patient: Adjust clothing prior to toileting, Performs perineal hygiene, Adjust clothing after toileting Toileting steps completed by helper: Adjust clothing prior to toileting, Adjust clothing after toileting Toileting Assistive Devices: Grab bar or rail Assist level: Touching or steadying assistance (Pt.75%)  Function - Archivist transfer activity did not occur: Refused Toilet transfer assistive device: Elevated toilet seat/BSC over toilet, Walker Assist level to toilet: Touching or steadying assistance (Pt > 75%) Assist level from toilet: Touching or steadying assistance (Pt > 75%)  Function - Chair/bed transfer Chair/bed transfer method: Stand pivot Chair/bed transfer assist level: Touching or steadying assistance (Pt > 75%) Chair/bed transfer assistive device: Walker Chair/bed transfer details: Visual cues/gestures for precautions/safety  Function - Locomotion:  Wheelchair Will patient use wheelchair at discharge?: No Type: Manual Wheelchair activity did not occur: N/A Max wheelchair distance: 100 Assist Level: Touching or steadying assistance (Pt > 75%) Wheel 50 feet with 2 turns activity did not occur: N/A Assist Level: Touching or steadying assistance (Pt > 75%) Wheel 150 feet activity did not occur: N/A Turns around,maneuvers to table,bed, and toilet,negotiates 3% grade,maneuvers on rugs and over doorsills: No Function - Locomotion: Ambulation Assistive device: Walker-rolling Max distance: 150 Assist level: Supervision or verbal cues Assist level: Supervision or verbal cues Assist level: Supervision or verbal cues Assist level: Supervision or verbal cues Walk 10 feet on uneven surfaces activity did not occur: Safety/medical concerns Assist level: Touching or steadying assistance (Pt > 75%)  Function - Comprehension Comprehension: Auditory Comprehension assist level: Understands basic 90% of the time/cues < 10% of the time  Function - Expression Expression: Verbal Expression assist level: Expresses basic 50 - 74% of the time/requires cueing 25 - 49% of the time. Needs to repeat parts of sentences.  Function - Social Interaction Social Interaction assist level: Interacts appropriately 75 - 89% of the time - Needs redirection for appropriate language or to initiate interaction.  Function - Problem Solving Problem solving assist level: Solves basic 50 - 74% of the time/requires cueing 25 - 49% of the time  Function - Memory Memory assist level: Recognizes or recalls 25 - 49% of the time/requires cueing 50 - 75% of the time Patient normally able to recall (first 3 days only): Current season, That he or she is in a hospital  Medical Problem List and Plan: 1.  Right hemiparesis, aphasia, dysphagia dysarthria secondary to left MCA infarct  Cont CIR PT, OT SLP- plan is for SNF in [place] area  2.  DVT Prophylaxis/Anticoagulation: Lovenox  40 mg daily 3. Pain Management: Tylenol as needed,4. Mood: Celexa 40 mg daily 5. Neuropsych: This patient is capable of making decisions on he is own behalf. 6. Skin/Wound Care: Routine skin checks 7. Fluids/Electrolytes/Nutrition:               Dysphagia 2 thin, per SLP 8. Acute on chronic combined congestive heart failure. Continue Lasix. Monitor for any signs of fluid overload.  -RLE swelling chronic lymphedema not CHF related  -BMP reviewed, no worsening of renal fxn on increased diuretic Filed Weights   02/22/16 0627 02/23/16 0553 02/24/16 0507  Weight: 92.5 kg (204 lb) 92.6 kg (204 lb 2.3 oz) 93.3 kg (205 lb 11 oz)  9. Hypertension. Cardura 4 mg daily, Imdur 30 mg daily, Coreg 50 mg twice a day, lisinopril 20 mg daily.  , at home also took Amlodipine, apresoline, aldactone. Still elevated on amlodipine 5mg , increased to 10mg  on 1/19  -improved control until yesterday--observe today for pattern Vitals:   02/23/16 2133 02/24/16 0507  BP: (!) 175/88 (!) 168/80  Pulse:  68  Resp:  18  Temp:  97.5 F (36.4 C)    10. Diabetes mellitus with peripheral neuropathy. Hemoglobin A1c 6.4. Check blood sugars before meals and at bedtime. Diabetic teaching   CBGs am values improving  CBG (last 3)   Recent Labs  02/23/16 1707 02/23/16 2025 02/24/16 0628  GLUCAP 125* 164* 86    11. Ulcerative colitis. Sulfasalazine 2000 mg twice a day, no diarrhea, cont of bowel 12. BPH. Flomax 0.4 mg daily at bedtime. Check PVR 3 13. Hyperlipidemia. Lipitor 14. CRI stage III. Creatinine 1.50-1.58, remains, avoid NSAIDs and Metformin, proteinuria noted  BMP stable 15. LE edema/Lymphedema: per PT, will d/c wraps and cont TEDs. Hypoalbuminemia is contributing-  -continue prostat  LOS (Days) 13 A FACE TO FACE EVALUATION WAS PERFORMED  Daiki Dicostanzo T 02/24/2016, 8:23 AM

## 2016-02-25 ENCOUNTER — Inpatient Hospital Stay (HOSPITAL_COMMUNITY): Payer: Medicare Other | Admitting: Occupational Therapy

## 2016-02-25 ENCOUNTER — Inpatient Hospital Stay (HOSPITAL_COMMUNITY): Payer: Medicare Other

## 2016-02-25 ENCOUNTER — Inpatient Hospital Stay (HOSPITAL_COMMUNITY): Payer: Medicare Other | Admitting: Speech Pathology

## 2016-02-25 LAB — GLUCOSE, CAPILLARY
GLUCOSE-CAPILLARY: 84 mg/dL (ref 65–99)
GLUCOSE-CAPILLARY: 101 mg/dL — AB (ref 65–99)
Glucose-Capillary: 137 mg/dL — ABNORMAL HIGH (ref 65–99)
Glucose-Capillary: 129 mg/dL — ABNORMAL HIGH (ref 65–99)

## 2016-02-25 NOTE — Progress Notes (Signed)
Subjective/Complaints:  No issues overnite, pt without c/o this am  ROS: pt denies nausea, vomiting, diarrhea, cough, shortness of breath or chest pain   Objective: Vital Signs: Blood pressure (!) 148/60, pulse 68, temperature 98.5 F (36.9 C), temperature source Oral, resp. rate 18, height 5\' 11"  (1.803 m), weight 93.8 kg (206 lb 12.7 oz), SpO2 98 %. No results found. Results for orders placed or performed during the hospital encounter of 02/11/16 (from the past 72 hour(s))  Glucose, capillary     Status: None   Collection Time: 02/22/16  6:59 AM  Result Value Ref Range   Glucose-Capillary 68 65 - 99 mg/dL  Glucose, capillary     Status: Abnormal   Collection Time: 02/22/16 11:50 AM  Result Value Ref Range   Glucose-Capillary 117 (H) 65 - 99 mg/dL  Glucose, capillary     Status: None   Collection Time: 02/22/16  4:48 PM  Result Value Ref Range   Glucose-Capillary 99 65 - 99 mg/dL  Glucose, capillary     Status: Abnormal   Collection Time: 02/22/16  8:56 PM  Result Value Ref Range   Glucose-Capillary 174 (H) 65 - 99 mg/dL   Comment 1 Notify RN   Glucose, capillary     Status: None   Collection Time: 02/23/16  6:44 AM  Result Value Ref Range   Glucose-Capillary 95 65 - 99 mg/dL   Comment 1 Notify RN   Glucose, capillary     Status: Abnormal   Collection Time: 02/23/16 11:51 AM  Result Value Ref Range   Glucose-Capillary 111 (H) 65 - 99 mg/dL  Glucose, capillary     Status: Abnormal   Collection Time: 02/23/16  5:07 PM  Result Value Ref Range   Glucose-Capillary 125 (H) 65 - 99 mg/dL  Glucose, capillary     Status: Abnormal   Collection Time: 02/23/16  8:25 PM  Result Value Ref Range   Glucose-Capillary 164 (H) 65 - 99 mg/dL  Glucose, capillary     Status: None   Collection Time: 02/24/16  6:28 AM  Result Value Ref Range   Glucose-Capillary 86 65 - 99 mg/dL  Glucose, capillary     Status: Abnormal   Collection Time: 02/24/16 11:29 AM  Result Value Ref Range   Glucose-Capillary 104 (H) 65 - 99 mg/dL  Glucose, capillary     Status: Abnormal   Collection Time: 02/24/16  5:05 PM  Result Value Ref Range   Glucose-Capillary 114 (H) 65 - 99 mg/dL  Glucose, capillary     Status: Abnormal   Collection Time: 02/24/16  8:37 PM  Result Value Ref Range   Glucose-Capillary 142 (H) 65 - 99 mg/dL  Glucose, capillary     Status: None   Collection Time: 02/25/16  6:35 AM  Result Value Ref Range   Glucose-Capillary 84 65 - 99 mg/dL     HEENT: Normocephaic, atraumatic Cardio: RRR Resp: CTA bilat GI: BS positive and NT, ND Skin:   Intact. Warm and dry.  Neuro: alert.   Flat Motor: 4/5 LUE proximal to distal LLE: 4/5 proxima to distal  RUE: 4-/5 proximal to distal RLE: 4-/5 HF KE ADF Dysarthric speech but intelligbile  Musc/Skel:  No tenderness. Right foot Edema, no pain with bilateral shoulder ROMGen NAD.     Assessment/Plan: 1. Functional deficits secondary to Left MCA infarct with aphasia, Right hemiparesis, dysarthria which require 3+ hours per day of interdisciplinary therapy in a comprehensive inpatient rehab setting. Physiatrist is providing close  team supervision and 24 hour management of active medical problems listed below. Physiatrist and rehab team continue to assess barriers to discharge/monitor patient progress toward functional and medical goals. FIM: Function - Bathing Position: Shower Body parts bathed by patient: Right arm, Chest, Abdomen, Front perineal area, Right upper leg, Left upper leg, Left lower leg, Buttocks, Right lower leg Body parts bathed by helper: Left arm, Back Bathing not applicable: Right lower leg (Pt with lymphedema wrapping) Assist Level: Touching or steadying assistance(Pt > 75%)  Function- Upper Body Dressing/Undressing What is the patient wearing?: Pull over shirt/dress Pull over shirt/dress - Perfomed by patient: Thread/unthread left sleeve, Put head through opening, Pull shirt over trunk, Thread/unthread  right sleeve Pull over shirt/dress - Perfomed by helper: Thread/unthread right sleeve Assist Level: Supervision or verbal cues Function - Lower Body Dressing/Undressing What is the patient wearing?: Underwear, Pants, Non-skid slipper socks, Ted Hose Position:  (bedside chair) Underwear - Performed by patient: Thread/unthread left underwear leg, Thread/unthread right underwear leg, Pull underwear up/down Underwear - Performed by helper: Thread/unthread right underwear leg Pants- Performed by patient: Thread/unthread right pants leg, Thread/unthread left pants leg, Pull pants up/down Pants- Performed by helper: Thread/unthread right pants leg Non-skid slipper socks- Performed by patient: Don/doff right sock, Don/doff left sock Non-skid slipper socks- Performed by helper: Don/doff left sock, Don/doff right sock TED Hose - Performed by helper: Don/doff right TED hose, Don/doff left TED hose Assist for footwear: Supervision/touching assist Assist for lower body dressing: Touching or steadying assistance (Pt > 75%)  Function - Toileting Toileting activity did not occur: No continent bowel/bladder event Toileting steps completed by patient: Adjust clothing prior to toileting Toileting steps completed by helper: Adjust clothing prior to toileting, Performs perineal hygiene, Adjust clothing after toileting Toileting Assistive Devices: Grab bar or rail Assist level: Two helpers  Function - Archivist transfer activity did not occur: Set designer transfer assistive device: Elevated toilet seat/BSC over toilet, Walker Assist level to toilet: Moderate assist (Pt 50 - 74%/lift or lower) Assist level from toilet: Moderate assist (Pt 50 - 74%/lift or lower)  Function - Chair/bed transfer Chair/bed transfer method: Ambulatory Chair/bed transfer assist level: Touching or steadying assistance (Pt > 75%) Chair/bed transfer assistive device: Walker, Orthosis (R hand orthosis) Chair/bed  transfer details: Visual cues/gestures for precautions/safety  Function - Locomotion: Wheelchair Will patient use wheelchair at discharge?: No Type: Manual Wheelchair activity did not occur: N/A Max wheelchair distance: 100 Assist Level: Touching or steadying assistance (Pt > 75%) Wheel 50 feet with 2 turns activity did not occur: N/A Assist Level: Touching or steadying assistance (Pt > 75%) Wheel 150 feet activity did not occur: N/A Turns around,maneuvers to table,bed, and toilet,negotiates 3% grade,maneuvers on rugs and over doorsills: No Function - Locomotion: Ambulation Assistive device: Walker-rolling, Orthosis (R hand orthosis) Max distance: 150 ft Assist level: Touching or steadying assistance (Pt > 75%) Assist level: Touching or steadying assistance (Pt > 75%) Assist level: Touching or steadying assistance (Pt > 75%) Assist level: Touching or steadying assistance (Pt > 75%) Walk 10 feet on uneven surfaces activity did not occur: Safety/medical concerns Assist level: Touching or steadying assistance (Pt > 75%)  Function - Comprehension Comprehension: Auditory Comprehension assist level: Understands basic 90% of the time/cues < 10% of the time  Function - Expression Expression: Verbal Expression assist level: Expresses basic 50 - 74% of the time/requires cueing 25 - 49% of the time. Needs to repeat parts of sentences.  Function - Social Interaction Social Interaction assist  level: Interacts appropriately 50 - 74% of the time - May be physically or verbally inappropriate.  Function - Problem Solving Problem solving assist level: Solves basic 75 - 89% of the time/requires cueing 10 - 24% of the time  Function - Memory Memory assist level: Recognizes or recalls 25 - 49% of the time/requires cueing 50 - 75% of the time Patient normally able to recall (first 3 days only): Current season, That he or she is in a hospital  Medical Problem List and Plan: 1.  Right hemiparesis,  aphasia, dysphagia dysarthria secondary to left MCA infarct  Cont CIR PT, OT SLP- plan is for SNF in [place] area  2.  DVT Prophylaxis/Anticoagulation: Lovenox 40 mg daily 3. Pain Management: Tylenol as needed,4. Mood: Celexa 40 mg daily 5. Neuropsych: This patient is capable of making decisions on he is own behalf. 6. Skin/Wound Care: Routine skin checks 7. Fluids/Electrolytes/Nutrition:               Dysphagia 2 thin, per SLP 8. Acute on chronic combined congestive heart failure. Continue Lasix. Monitor for any signs of fluid overload.  -RLE swelling chronic lymphedema not CHF related  -BMP reviewed, no worsening of renal fxn on increased diuretic Filed Weights   02/23/16 0553 02/24/16 0507 02/25/16 0446  Weight: 92.6 kg (204 lb 2.3 oz) 93.3 kg (205 lb 11 oz) 93.8 kg (206 lb 12.7 oz)  9. Hypertension. Cardura 4 mg daily, Imdur 30 mg daily, Coreg 50 mg twice a day, lisinopril 20 mg daily.  , at home also took Amlodipine, apresoline, aldactone. Still elevated on amlodipine 5mg , increased to 10mg  on 1/19, BPs responding  -improved control until yesterday--observe today for pattern Vitals:   02/24/16 2214 02/25/16 0446  BP:  (!) 148/60  Pulse: 67 68  Resp: 16 18  Temp:  98.5 F (36.9 C)    10. Diabetes mellitus with peripheral neuropathy. Hemoglobin A1c 6.4. Check blood sugars before meals and at bedtime. Diabetic teaching   CBGs am values improving  CBG (last 3)   Recent Labs  02/24/16 1705 02/24/16 2037 02/25/16 0635  GLUCAP 114* 142* 84    11. Ulcerative colitis. Sulfasalazine 2000 mg twice a day, no diarrhea, cont of bowel 12. BPH. Flomax 0.4 mg daily at bedtime. Check PVR 3 13. Hyperlipidemia. Lipitor 14. CRI stage III. Creatinine 1.50-1.58, remains, avoid NSAIDs and Metformin, proteinuria noted  BMP stable 15. LE edema/Lymphedema: per PT, will d/c wraps and cont TEDs. Hypoalbuminemia is contributing-  -continue prostat  LOS (Days) 14 A FACE TO FACE EVALUATION WAS  PERFORMED  KIRSTEINS,ANDREW E 02/25/2016, 6:56 AM

## 2016-02-25 NOTE — Progress Notes (Signed)
Speech Language Pathology Daily Session Note  Patient Details  Name: [name] MRN: [id number] Date of Birth: November 16, 1960  Today's Date: 02/25/2016 SLP Individual Time: 1303-1400 SLP Individual Time Calculation (min): 57 min  Short Term Goals: Week 2: SLP Short Term Goal 1 (Week 2): Pt will consume dys 1 textures and thin liquids with supervision verbal cues for use of swallowing precautions and minimal overt s/s of aspiration.   SLP Short Term Goal 2 (Week 2): Pt will consume therapeutic trials of dys 2 textures with min verbal cues for use of swallowing precautions over 2 consecutive sessions prior to advancement.   SLP Short Term Goal 3 (Week 2): Pt will visually scan to the right of midline during basic functional tasks in >75% of opportunities with mod I.   SLP Short Term Goal 4 (Week 2): Pt will recognize and correct verbal errors in the moment during phrase/sentence level expression with supervision verbal cues.  SLP Short Term Goal 5 (Week 2): Pt will utilize increased vocal intensity and overarticulation to achieve intelligibility at the sentence level with min verbal cues.   SLP Short Term Goal 6 (Week 2): Pt will utilize compensatory word finding strategies at the phrase/sentence level with supervision verbal cues.    Skilled Therapeutic Interventions:Pt was seen for skilled ST targeting goals for dysphagia and cognition.  SLP facilitated the session with a trial meal tray of dys 3 textures and thin liquids to continue working towards diet progression.  Pt demonstrated increased rate of intake with advanced solids which lead to right sided pocketing.  Pt needed mod faded to min assist verbal cues for use of rate and portion control as well as liquid wash to mitigate residue.  No overt s/s of aspiration were evident with solids or liquids.  Recommend advancement to dys 3 solids with ongoing need for full supervision to reinforce use of swallowing precautions.  Following completion of  meal, pt returned demonstration of safe walker use with supervision verbal cues while ambulating from recliner to sink.  Pt was able to complete oral care while standing sinkside with distant supervision except for set up assist for opening containers which pt was able to direct as needed.  Pt was returned to recliner with call bell within reach and quick release belt donned for safety.  Continue per current plan of care.       Function:  Eating Eating   Modified Consistency Diet: Yes Eating Assist Level: Set up assist for;Supervision or verbal cues   Eating Set Up Assist For: Opening containers       Cognition Comprehension Comprehension assist level: Understands basic 90% of the time/cues < 10% of the time  Expression   Expression assist level: Expresses basic 50 - 74% of the time/requires cueing 25 - 49% of the time. Needs to repeat parts of sentences.  Social Interaction Social Interaction assist level: Interacts appropriately 75 - 89% of the time - Needs redirection for appropriate language or to initiate interaction.  Problem Solving Problem solving assist level: Solves basic 50 - 74% of the time/requires cueing 25 - 49% of the time  Memory Memory assist level: Recognizes or recalls 50 - 74% of the time/requires cueing 25 - 49% of the time    Pain Pain Assessment Pain Assessment: No/denies pain  Therapy/Group: Individual Therapy  Birtha Hatler, Melanee Spry 02/25/2016, 3:47 PM

## 2016-02-25 NOTE — Progress Notes (Addendum)
Social Work Patient ID: [name], male   DOB: Mar 31, 1960, 56 y.o.   MRN: [id number]  Spoke with several NH in the [place] to look for available bed. Spoke with brother who has many questions Regarding pt's prognosis and his functional level. [facility] have expressed interest in taking pt. Brother aware and plans to contact them to ask his questions. Discussed pt will get eventually to Assisted living level and he will need to look for a facility for this level of care. The [facility] form was faxed into them so can begin processing the form for pt. Will work toward pt's discharge this week.

## 2016-02-25 NOTE — Progress Notes (Signed)
Occupational Therapy Session Note  Patient Details  Name: Akihiro Cogburn MRN: [id number] Date of Birth: 12-Oct-1960  Today's Date: 02/25/2016 OT Individual Time: 0903-1001 OT Individual Time Calculation (min): 58 min    Short Term Goals: Week 2:  OT Short Term Goal 1 (Week 2): Pt will continue working toward established supervision level goals for discharge.    Skilled Therapeutic Interventions/Progress Updates:  Pt completed toileting, bathing, and dressing during session.  He was able to complete ambulation with use of the RW and hand splint on the right side with min assist to the toilet and to the shower bench for bathing.  He completed bath with min assist sit to stand but did not attempt washing his feet this session.  Dressing sit to stand from the bedside chair with overall min assist as well.  Therapist had to assist with TEDs and gripper socks secondary to decreased time.  Pt left in bedside chair to complete session.    Therapy Documentation Precautions:  Precautions Precautions: Fall Precaution Comments: right sided weakness Restrictions Weight Bearing Restrictions: No  Pain: Pain Assessment Pain Assessment: No/denies pain ADL: See Function Navigator for Current Functional Status.   Therapy/Group: Individual Therapy  Sriram Febles 02/25/2016, 12:30 PM

## 2016-02-25 NOTE — Progress Notes (Signed)
Physical Therapy Note  Patient Details  Name: [name] MRN: [id number] Date of Birth: 04/02/1960 Today's Date: 02/25/2016  1045-1200, 75 min individual tx Pain: none reported  Recliner> stand with RW, 2 cues for R hand placement and strap on orthosis of RW.  neuromuscular re-education via forced use for alternating reciprocal movement x 4 extremities at level 4 x 5 minutes.    Patient demonstrates increased fall risk as noted by score of   15/56 on Berg Balance Scale.  (<36= high risk for falls, close to 100%; 37-45 significant >80%; 46-51 moderate >50%; 52-55 lower >25%).  Score and falls risk explained to pt.  Standing activity biased to L x 9 minutes, folding pillow cases on table in front, using bil hands spontaneously; pt managed 4 pillow cases in 8 minutes.  Seated rest break needed due to RLE fatigue.  biodex for postural stability and wt shifting training M><L and A><P.  Pt consistently over-shifts to R, stating he feels "funny" with full L wt bearing.  Pt was intermittently able to shift over to L, improving with practice. At end of training, pt unable to state what the problem is with his balance: over-shifting to R. Gait to/from room with RW with supervision, cue for R hand strap. Pt left resting in recliner with quick release belt on, all needs within reach.   See function navigator for current status  Theresa Dohrman 02/25/2016, 7:59 AM

## 2016-02-26 ENCOUNTER — Inpatient Hospital Stay (HOSPITAL_COMMUNITY): Payer: Medicare Other | Admitting: Occupational Therapy

## 2016-02-26 ENCOUNTER — Inpatient Hospital Stay (HOSPITAL_COMMUNITY): Payer: Medicare Other | Admitting: Speech Pathology

## 2016-02-26 ENCOUNTER — Inpatient Hospital Stay (HOSPITAL_COMMUNITY): Payer: Medicare Other | Admitting: Physical Therapy

## 2016-02-26 DIAGNOSIS — I69319 Unspecified symptoms and signs involving cognitive functions following cerebral infarction: Secondary | ICD-10-CM

## 2016-02-26 LAB — GLUCOSE, CAPILLARY
Glucose-Capillary: 79 mg/dL (ref 65–99)
Glucose-Capillary: 98 mg/dL (ref 65–99)
Glucose-Capillary: 145 mg/dL — ABNORMAL HIGH (ref 65–99)
Glucose-Capillary: 150 mg/dL — ABNORMAL HIGH (ref 65–99)

## 2016-02-26 NOTE — Plan of Care (Signed)
Problem: RH Grooming Goal: LTG Patient will perform grooming w/assist,cues/equip (OT) LTG: Patient will perform grooming with assist, with/without cues using equipment (OT)  Outcome: Not Applicable Date Met: 23/30/07 Needs supervision  Problem: RH Dressing Goal: LTG Patient will perform upper body dressing (OT) LTG Patient will perform upper body dressing with assist, with/without cues (OT).  Outcome: Not Met (add Reason) Needs supervision

## 2016-02-26 NOTE — Progress Notes (Signed)
Physical Therapy Note  Patient Details  Name: [name] MRN: [id number] Date of Birth: 02-02-1961 Today's Date: 02/26/2016    Time: 1400-1430 30 minutes  1:1 No c/o pain. Pt able to gait with supervision in controlled environment 150' x 2.  Squat and reach task with Rt UE manipulation of clothes pins with min A for balance, min hand over hand assist to squeeze clothes pins.     DONAWERTH,KAREN 02/26/2016, 2:31 PM

## 2016-02-26 NOTE — Plan of Care (Signed)
Problem: RH Balance Goal: LTG Patient will maintain dynamic standing balance (PT) LTG:  Patient will maintain dynamic standing balance with assistance during mobility activities (PT)  Outcome: Completed/Met Date Met: 02/26/16 With RW  Problem: RH Bed to Chair Transfers Goal: LTG Patient will perform bed/chair transfers w/assist (PT) LTG: Patient will perform bed/chair transfers with assistance, with/without cues (PT).  Outcome: Completed/Met Date Met: 02/26/16 With RW  Problem: RH Car Transfers Goal: LTG Patient will perform car transfers with assist (PT) LTG: Patient will perform car transfers with assistance (PT).  Outcome: Completed/Met Date Met: 02/26/16 With RW  Problem: RH Furniture Transfers Goal: LTG Patient will perform furniture transfers w/assist (OT/PT LTG: Patient will perform furniture transfers  with assistance (OT/PT).  Outcome: Completed/Met Date Met: 02/26/16 With RW  Problem: RH Ambulation Goal: LTG Patient will ambulate in controlled environment (PT) LTG: Patient will ambulate in a controlled environment, # of feet with assistance (PT).  Outcome: Completed/Met Date Met: 02/26/16 150 ft with RW Goal: LTG Patient will ambulate in home environment (PT) LTG: Patient will ambulate in home environment, # of feet with assistance (PT).  Outcome: Completed/Met Date Met: 02/26/16 50 ft with RW  Problem: RH Stairs Goal: LTG Patient will ambulate up and down stairs w/assist (PT) LTG: Patient will ambulate up and down # of stairs with assistance (PT)  Outcome: Completed/Met Date Met: 02/26/16 24 steps with B rails

## 2016-02-26 NOTE — Progress Notes (Signed)
Subjective/Complaints:    ROS: pt denies nausea, vomiting, diarrhea, cough, shortness of breath or chest pain   Objective: Vital Signs: Blood pressure (!) 153/79, pulse 61, temperature 99.2 F (37.3 C), temperature source Oral, resp. rate 17, height 5\' 11"  (1.803 m), weight 93.7 kg (206 lb 9.1 oz), SpO2 98 %. No results found. Results for orders placed or performed during the hospital encounter of 02/11/16 (from the past 72 hour(s))  Glucose, capillary     Status: Abnormal   Collection Time: 02/23/16 11:51 AM  Result Value Ref Range   Glucose-Capillary 111 (H) 65 - 99 mg/dL  Glucose, capillary     Status: Abnormal   Collection Time: 02/23/16  5:07 PM  Result Value Ref Range   Glucose-Capillary 125 (H) 65 - 99 mg/dL  Glucose, capillary     Status: Abnormal   Collection Time: 02/23/16  8:25 PM  Result Value Ref Range   Glucose-Capillary 164 (H) 65 - 99 mg/dL  Glucose, capillary     Status: None   Collection Time: 02/24/16  6:28 AM  Result Value Ref Range   Glucose-Capillary 86 65 - 99 mg/dL  Glucose, capillary     Status: Abnormal   Collection Time: 02/24/16 11:29 AM  Result Value Ref Range   Glucose-Capillary 104 (H) 65 - 99 mg/dL  Glucose, capillary     Status: Abnormal   Collection Time: 02/24/16  5:05 PM  Result Value Ref Range   Glucose-Capillary 114 (H) 65 - 99 mg/dL  Glucose, capillary     Status: Abnormal   Collection Time: 02/24/16  8:37 PM  Result Value Ref Range   Glucose-Capillary 142 (H) 65 - 99 mg/dL  Glucose, capillary     Status: None   Collection Time: 02/25/16  6:35 AM  Result Value Ref Range   Glucose-Capillary 84 65 - 99 mg/dL  Glucose, capillary     Status: Abnormal   Collection Time: 02/25/16 12:12 PM  Result Value Ref Range   Glucose-Capillary 101 (H) 65 - 99 mg/dL  Glucose, capillary     Status: Abnormal   Collection Time: 02/25/16  4:41 PM  Result Value Ref Range   Glucose-Capillary 137 (H) 65 - 99 mg/dL  Glucose, capillary     Status:  Abnormal   Collection Time: 02/25/16  9:45 PM  Result Value Ref Range   Glucose-Capillary 129 (H) 65 - 99 mg/dL  Glucose, capillary     Status: None   Collection Time: 02/26/16  6:25 AM  Result Value Ref Range   Glucose-Capillary 79 65 - 99 mg/dL     HEENT: Normocephaic, atraumatic Cardio: RRR Resp: CTA bilat GI: BS positive and NT, ND Skin:   Intact. Warm and dry.  Neuro: alert.   Flat Motor: 4/5 LUE proximal to distal LLE: 4/5 proxima to distal  RUE: 4-/5 proximal to distal RLE: 4-/5 HF KE ADF Dysarthric speech but intelligbile  Musc/Skel:  No tenderness. Right foot Edema, no pain with bilateral shoulder ROMGen NAD.     Assessment/Plan: 1. Functional deficits secondary to Left MCA infarct with aphasia, Right hemiparesis, dysarthria which require 3+ hours per day of interdisciplinary therapy in a comprehensive inpatient rehab setting. Physiatrist is providing close team supervision and 24 hour management of active medical problems listed below. Physiatrist and rehab team continue to assess barriers to discharge/monitor patient progress toward functional and medical goals. FIM: Function - Bathing Position: Shower Body parts bathed by patient: Right arm, Left arm, Chest, Abdomen, Front perineal area,  Buttocks, Right upper leg, Left upper leg, Right lower leg Body parts bathed by helper: Left arm, Back Bathing not applicable: Left lower leg Assist Level: Touching or steadying assistance(Pt > 75%)  Function- Upper Body Dressing/Undressing What is the patient wearing?: Pull over shirt/dress Pull over shirt/dress - Perfomed by patient: Thread/unthread left sleeve, Put head through opening, Pull shirt over trunk, Thread/unthread right sleeve Pull over shirt/dress - Perfomed by helper: Thread/unthread right sleeve Assist Level: Supervision or verbal cues Function - Lower Body Dressing/Undressing What is the patient wearing?: Underwear, Pants, Non-skid slipper socks, Ted  Hose Position:  (bedside chair) Underwear - Performed by patient: Thread/unthread left underwear leg, Thread/unthread right underwear leg, Pull underwear up/down Underwear - Performed by helper: Thread/unthread right underwear leg Pants- Performed by patient: Thread/unthread right pants leg, Thread/unthread left pants leg, Pull pants up/down Pants- Performed by helper: Thread/unthread right pants leg Non-skid slipper socks- Performed by patient: Don/doff right sock, Don/doff left sock Non-skid slipper socks- Performed by helper: Don/doff left sock, Don/doff right sock TED Hose - Performed by helper: Don/doff right TED hose, Don/doff left TED hose Assist for footwear: Supervision/touching assist Assist for lower body dressing: Touching or steadying assistance (Pt > 75%)  Function - Toileting Toileting activity did not occur: No continent bowel/bladder event Toileting steps completed by patient: Performs perineal hygiene Toileting steps completed by helper: Adjust clothing prior to toileting, Adjust clothing after toileting Toileting Assistive Devices: Grab bar or rail Assist level: Touching or steadying assistance (Pt.75%)  Function - Archivist transfer activity did not occur: Refused Toilet transfer assistive device: Elevated toilet seat/BSC over toilet, Walker Assist level to toilet: Touching or steadying assistance (Pt > 75%) Assist level from toilet: Touching or steadying assistance (Pt > 75%)  Function - Chair/bed transfer Chair/bed transfer method: Ambulatory Chair/bed transfer assist level: Supervision or verbal cues Chair/bed transfer assistive device: Walker, Orthosis Chair/bed transfer details: Verbal cues for safe use of DME/AE  Function - Locomotion: Wheelchair Will patient use wheelchair at discharge?: No Type: Manual Wheelchair activity did not occur: N/A Max wheelchair distance: 100 Assist Level: Touching or steadying assistance (Pt > 75%) Wheel 50  feet with 2 turns activity did not occur: N/A Assist Level: Touching or steadying assistance (Pt > 75%) Wheel 150 feet activity did not occur: N/A Turns around,maneuvers to table,bed, and toilet,negotiates 3% grade,maneuvers on rugs and over doorsills: No Function - Locomotion: Ambulation Assistive device: Walker-rolling, Orthosis Max distance: 150 Assist level: Supervision or verbal cues Assist level: Supervision or verbal cues Assist level: Supervision or verbal cues Assist level: Supervision or verbal cues Walk 10 feet on uneven surfaces activity did not occur: Safety/medical concerns Assist level: Touching or steadying assistance (Pt > 75%)  Function - Comprehension Comprehension: Auditory Comprehension assist level: Understands basic 90% of the time/cues < 10% of the time  Function - Expression Expression: Verbal Expression assist level: Expresses basic 50 - 74% of the time/requires cueing 25 - 49% of the time. Needs to repeat parts of sentences.  Function - Social Interaction Social Interaction assist level: Interacts appropriately 50 - 74% of the time - May be physically or verbally inappropriate.  Function - Problem Solving Problem solving assist level: Solves basic 50 - 74% of the time/requires cueing 25 - 49% of the time  Function - Memory Memory assist level: Recognizes or recalls 75 - 89% of the time/requires cueing 10 - 24% of the time Patient normally able to recall (first 3 days only): Current season, That he or  she is in a hospital  Medical Problem List and Plan: 1.  Right hemiparesis, aphasia, dysphagia dysarthria secondary to left MCA infarct  Cont CIR PT, OT SLP- Team conf in am  2.  DVT Prophylaxis/Anticoagulation: Lovenox 40 mg daily 3. Pain Management: Tylenol as needed,4. Mood: Celexa 40 mg daily 5. Neuropsych: This patient is capable of making decisions on he is own behalf. 6. Skin/Wound Care: Routine skin checks 7. Fluids/Electrolytes/Nutrition:                Dysphagia 3 thin, per SLP, no choking or coughing reported 8. Acute on chronic combined congestive heart failure. Continue Lasix. Monitor for any signs of fluid overload.  -no clinical signs of exacerbation  Filed Weights   02/24/16 0507 02/25/16 0446 02/26/16 0606  Weight: 93.3 kg (205 lb 11 oz) 93.8 kg (206 lb 12.7 oz) 93.7 kg (206 lb 9.1 oz)  9. Hypertension. Cardura 4 mg daily, Imdur 30 mg daily, Coreg 50 mg twice a day, lisinopril 20 mg daily.  , at home also took Amlodipine, apresoline, aldactone. Still elevated on amlodipine 5mg , increased to 10mg  on 1/19, BPs responding Systolic elevated this am no changes for now Vitals:   02/25/16 2210 02/26/16 0606  BP:  (!) 153/79  Pulse: 65 61  Resp: 18 17  Temp:  99.2 F (37.3 C)    10. Diabetes mellitus with peripheral neuropathy. Hemoglobin A1c 6.4. Check blood sugars before meals and at bedtime. Diabetic teaching   CBGs  improving  CBG (last 3)   Recent Labs  02/25/16 1641 02/25/16 2145 02/26/16 0625  GLUCAP 137* 129* 79    11. Ulcerative colitis. Sulfasalazine 2000 mg twice a day, no diarrhea, cont of bowel 12. BPH. Flomax 0.4 mg daily at bedtime. Check PVR 3 13. Hyperlipidemia. Lipitor 14. CRI stage III. Creatinine 1.50-1.58, remains, avoid NSAIDs and Metformin, proteinuria noted  BMP stable 15. LE edema/Lymphedema: per PT, will d/c wraps and cont TEDs. Hypoalbuminemia is contributing-  -continue prostat  LOS (Days) 15 A FACE TO FACE EVALUATION WAS PERFORMED  KIRSTEINS,ANDREW E 02/26/2016, 7:07 AM

## 2016-02-26 NOTE — Progress Notes (Signed)
Occupational Therapy Discharge Summary  Patient Details  Name: [name] MRN: [id number] Date of Birth: 26-Sep-1960  Today's Date: 02/26/2016 OT Individual Time: 0901-1002 OT Individual Time Calculation (min): 61 min   Session Note:  Pt worked on bathing and dressing from wheelchair level per his choice.  He was able to complete all bathing with overall supervision but needed mod instructional cueing for thoroughness and for hand positioning with the RUE during sit to stand transitions.  Increased time and mod instructional cueing needed for hemi dressing techniques to donn underpants over the RLE as well.  Pt also completed toilet transfer during session, with ambulation to the toilet with close supervision using the RW.  Supervision for toileting aspects as well.  Pt left in wheelchair at end of session with call button and phone in reach.    Patient has met 13 of 14 long term goals due to improved activity tolerance, improved balance, postural control, ability to compensate for deficits, functional use of  RIGHT upper and RIGHT lower extremity, improved attention, improved awareness and improved coordination.  Patient to discharge at overall Supervision level.  Patient's care partner unavailable to provide the necessary physical and cognitive assistance at discharge.  Pt is discharging to SNF for further therapy and for 24 hour supervision/assist.  Reasons goals not met: Pt needs supervision for grooming tasks as well as for UB dressing.  Recommendation:  Patient will benefit from ongoing skilled OT services in skilled nursing facility setting to continue to advance functional skills in the area of BADL. Feel pt will benefit from continued OT at SNF level in order to reach modified independent level.  He still demonstrates decreased RUE functional use and coordination as well as decreased dynamic standing balance with ADLs.  Decreased ability to sequence through selfcare tasks as well is still  present with mod instructional cueing for thoroughness and for RUE positioning during sit to stand.    Equipment: No equipment provided  Reasons for discharge: treatment goals met and discharge from hospital  Patient/family agrees with progress made and goals achieved: Yes  OT Discharge Precautions/Restrictions  Precautions Precautions: Fall Precaution Comments: R hemiplegia Restrictions Weight Bearing Restrictions: No Pain  No report of pain during session  ADL ADL ADL Comments: Please see functional navigator Vision/Perception  Vision- History Baseline Vision/History: Wears glasses Wears Glasses: At all times (No glasses noted during sessions) Vision- Assessment Additional Comments: Pt functionally does not exhibit right in-attention during selfcare tasks or right visual field deficit.  Did not formally assess however.  Cognition Overall Cognitive Status: Impaired/Different from baseline Arousal/Alertness: Awake/alert Orientation Level: Oriented X4 Attention: Sustained;Selective Sustained Attention: Appears intact Selective Attention: Appears intact Memory Impairment: Decreased short term memory Decreased Short Term Memory: Functional basic;Functional complex Awareness: Impaired Awareness Impairment: Anticipatory impairment;Emergent impairment Problem Solving: Impaired Problem Solving Impairment: Functional basic;Functional complex Safety/Judgment: Impaired Comments: He still needs cueing for RUE placement during sit to stand and to remove it from the walker splint before attempting to sit down.   Sensation Sensation Light Touch: Appears Intact Stereognosis: Not tested Hot/Cold: Appears Intact Proprioception: Not tested Coordination Gross Motor Movements are Fluid and Coordinated: No Fine Motor Movements are Fluid and Coordinated: No Coordination and Movement Description: Pt with moderate right hemiparesis.  He needs min assist to integrate into tasks such as  squeezing out his washcloth or removing lids from bottles or deodorant.  He is able to use it for holding items, washing his LUE and assisting with pulling up his underpants  and pants with supervision and increased time.   Motor  Motor Motor: Hemiplegia Motor - Discharge Observations: right UE and LE hemiparesis still present Mobility  Bed Mobility Bed Mobility: Supine to Sit Supine to Sit: 5: Supervision Transfers Transfers: Sit to Stand;Stand to Sit Sit to Stand: 5: Supervision;With armrests;With upper extremity assist Stand to Sit: 5: Supervision;With armrests;To chair/3-in-1  Trunk/Postural Assessment  Cervical Assessment Cervical Assessment: Within Functional Limits (forward cervical protraction) Thoracic Assessment Thoracic Assessment: Exceptions to [facility] (still with slight kyphosis) Lumbar Assessment Lumbar Assessment: Exceptions to [facility] (sits statically in a posterior pelvic tilt)  Balance Balance Balance Assessed: Yes Static Sitting Balance Static Sitting - Balance Support: Feet supported Static Sitting - Level of Assistance: 6: Modified independent (Device/Increase time) Dynamic Sitting Balance Dynamic Sitting - Balance Support: Feet unsupported;During functional activity Dynamic Sitting - Level of Assistance: 6: Modified independent (Device/Increase time) Static Standing Balance Static Standing - Balance Support: Right upper extremity supported;Left upper extremity supported Static Standing - Level of Assistance: 5: Stand by assistance Dynamic Standing Balance Dynamic Standing - Balance Support: Bilateral upper extremity supported;During functional activity Dynamic Standing - Level of Assistance: 5: Stand by assistance Extremity/Trunk Assessment RUE Assessment RUE Assessment: Exceptions to [facility] (Pt with Brunnstrum stage IV movement in the arm and hand.  Decreased digit extension noted but exhibits full gross grasp.  Shoulder synergy pattern present as well when reaching  above 60 degrees.  Increased tone in finger flexors.) LUE Assessment LUE Assessment: Within Functional Limits (for selfcare tasks)   See Function Navigator for Current Functional Status.  Brooks Kinnan OTR/L 02/26/2016, 4:45 PM

## 2016-02-26 NOTE — Discharge Summary (Signed)
NAMEALEKXANDER, [name] NO.:  [id number]  MEDICAL RECORD NO.:  [id number]  LOCATION:                                 FACILITY:  PHYSICIAN:  Erick Colace, M.D.DATE OF BIRTH:  [birth year]  DATE OF ADMISSION:  02/11/2016 DATE OF DISCHARGE:                              DISCHARGE SUMMARY   ANTICIPATED DISCHARGE DATE:  February 27, 2016.  HISTORY OF PRESENT ILLNESS:  This is a 56 year old right-handed male with history of diastolic congestive heart failure, diabetes mellitus, hypertension, TIA as well as chronic renal insufficiency, stage III. Per chart review, lives alone.  Reported to be independent with assistive device prior to admission.  Presented on February 05, 2016, with slurred speech, altered mental status, and right-sided weakness.  He was hypertensive in the 200s.  EKG showed sinus rhythm with a LAFB and LVH. Chest x-ray, new cardiomegaly with pulmonary vascular congestion and slight bilateral interstitial edema.  Cranial CT scan negative for acute changes, old left basal ganglia, and right occipital infarct.  CT cerebral perfusion scan showed small to moderate-sized left MCA territory infarct.  The patient did not receive tPA.  CT angiogram of the head and neck showed acute left M3 occlusion as well as scattered atheromatous plaque.  MRI, moderate left frontal lobe MCA territory infarct.  Echocardiogram with an ejection fraction of 35%, diffuse hypokinesis, and grade 2 diastolic dysfunction.  Venous Doppler study was negative for DVT.  Maintained on aspirin as well as Plavix x3 months, then Plavix alone.  Subcutaneous Lovenox for DVT prophylaxis. Dysphagia #2, thin liquid diet.  TEE completed on February 08, 2016, showing severe LV dysfunction and ejection fraction of 30% and no thrombi.  A loop recorder was placed.  Physical and occupational therapy ongoing.  The patient was admitted for a comprehensive rehab program.  PAST MEDICAL HISTORY:  See  discharge diagnoses.  SOCIAL HISTORY:  Lives alone independently with assistive device prior to admission.  Functional status upon admission to Rehab Services was moderate assist, stand pivot transfers, +2 physical assist, ambulates 55 feet, max total assist activities of daily living.  PHYSICAL EXAMINATION:  VITAL SIGNS:  Blood pressure 173/100 pulse 84, temperature 98, and respirations 20. GENERAL:  This is a well-developed male/ HEENT:  Normocephalic.  EOMs intact. NECK:  Supple.  Nontender.  No JVD. CARDIAC:  Regular rate and rhythm. RESPIRATORY:  With no respiratory wheezes, and no respiratory distress. ABDOMEN:  Soft and nontender.  Good bowel sounds. NEUROLOGIC:  Severely dysarthric with some expressive language deficits. Some delay in processing.  Followed simple commands.  He had a left gaze preference.  REHABILITATION HOSPITAL COURSE:  The patient was admitted to inpatient Rehab Services with therapies initiated on a 3-hour daily basis consisting of physical therapy, occupational therapy, speech therapy, and rehabilitation nursing.  The following issues were addressed during the patient's rehabilitation stay.  Pertaining to Mr. [name]'s left MCA infarct, he would now remain on just Plavix and aspirin therapy x3 months, then Plavix alone.  Follow up per Neurology Services. Subcutaneous Lovenox for DVT prophylaxis.  No bleeding episodes. Depression, maintained on Celexa and emotional support provided.  His diet was advanced to mechanical soft thin  liquid, tolerating well.  He exhibited no signs of fluid overload.  Monitored closely on Lasix. Blood pressure is controlled with current regimen.  Blood sugars acceptable.  Hemoglobin A1c is 6.4.  Diabetic teaching provided.  He did have a history of ulcerative colitis.  He remained on Azulfidine 2000 mg twice daily.  Stage III chronic renal insufficiency, creatinine of 1.50 to 1.58.  The patient received weekly collaborative  interdisciplinary team conferences to discuss estimated length of stay, family teaching, and any barriers to discharge.  The patient scored 15/56 on Berg balance scale.  Ambulated in the room and gymnasium with right upper extremity minimal assistance.  Performed car transfers, couch transfers with steady assistance.  He continued to require cuing for management of right upper extremity.  Activities of daily living and homemaking, he was able to complete ambulation with the use of a rolling walker, hand splint with minimal assistance to the toilet and shower bench for bathing.  Dressing, sit to stand from bedside chair with overall minimal assistance.  He was able to communicate his own needs, being noted to be dysarthric.  Due to limited assistance at home, it was felt skilled nursing facility with bed becoming available on February 27, 2016.  DISCHARGE MEDICATIONS:  Included: 1. Norvasc 5 mg p.o. daily. 2. Aspirin 325 mg p.o. daily. 3. Lipitor 20 mg p.o. at bedtime. 4. Os-Cal 1 tab p.o. daily. 5. Coreg 50 mg p.o. b.i.d. 6. Celexa 40 mg p.o. daily. 7. Plavix 75 mg p.o. daily. 8. Cardura 4 mg p.o. at bedtime. 9. Lasix 80 mg p.o. b.i.d. 10.Isordil 10 mg p.o. t.i.d. 11.Lisinopril 20 mg p.o. daily. 12.Magnesium oxide 400 mg p.o. b.i.d. 13.Multivitamin 1 tab daily. 14.Protonix 40 mg p.o. daily. 15.Potassium chloride 40 mEq p.o. daily. 16.Azulfidine 2000 mg p.o. b.i.d. 17.Flomax 0.4 mg at bedtime.  DIET:  His diet was mechanical soft, thin liquids.  The patient would follow up with Dr. Claudette Elliott at the outpatient rehab center as directed and Dr. Hurman Elliott, Cardiology Services.  Call for appointment.  Dr. Roda Elliott, Neurology Services.  Special instructions. Continue aspirin and Plavix x3 months total and then Plavix alone.     [name], P.A.   ______________________________ Erick Colace, M.D.    DA/MEDQ  D:  02/26/2016  T:  02/26/2016  Job:  [id number]  cc:    Erick Colace, M.D. Duke Salvia, MD, [facility]

## 2016-02-26 NOTE — Progress Notes (Signed)
Speech Language Pathology Discharge Summary  Patient Details  Name: [name] MRN: [id number] Date of Birth: 1960/10/31  Today's Date: 02/26/2016 SLP Individual Time: 1515-1600 SLP Individual Time Calculation (min): 45 min   Skilled Therapeutic Interventions:   Skilled treatment session focused on addressing communication goals. SLP facilitated session by providing Mod assist verbal and non-verbal cues to monitor and correct upright head posture and increased vocal intensity to maximize speech intelligibility at the phrase-sentence level of verbal expression.  Patient completed a generative naming task with Min verbal cues for use of description during moments of anomia with markedly improved intelligibility, requiring only Supervision level verbal cues for increased intensity.  Patient with continued needs and discussed recommendations for follow up services at the next level of care; patient in agreement with plan.   Patient has met 4 of 6 long term goals.  Patient to discharge at overall Supervision;Mod level.  Reasons goals not met: continues to require Mod cues for awareness of errors in the moment and Mod cues for word finding/ speech goals    Clinical Impression/Discharge Summary:    Patient has made functional gains during this rehab admission and has met 4 out of 6 long term goals due to improved functional abilities.  Patient is currently an overall Mod assist for basic cognitive tasks and requires Supervision assist for utilization of swallowing compensatory strategies to minimize overt s/s of aspiration with Dys.3 textures and thin liquid diet. Patient and family education is on going and patient will require 24 hour supervision. Patient requires follow up SLP services to continue efforts to maximize cognitive-linguistic abilities to maximize his functional independence and further reduce the burden of care at his next level of care, SNF.   Care Partner:  Caregiver Able to Provide  Assistance: No  Type of Caregiver Assistance: Cognitive;Physical  Recommendation:  Skilled Nursing facility;24 hour supervision/assistance  Rationale for SLP Follow Up: Maximize functional communication;Maximize cognitive function and independence;Maximize swallowing safety;Reduce caregiver burden   Equipment: none   Reasons for discharge: Discharged from hospital   Patient/Family Agrees with Progress Made and Goals Achieved: Yes   Function:  Eating Eating   Modified Consistency Diet: Yes Eating Assist Level: Set up assist for;Supervision or verbal cues   Eating Set Up Assist For: Opening containers       Cognition Comprehension Comprehension assist level: Understands basic 90% of the time/cues < 10% of the time  Expression   Expression assist level: Expresses basic 50 - 74% of the time/requires cueing 25 - 49% of the time. Needs to repeat parts of sentences.  Social Interaction Social Interaction assist level: Interacts appropriately 75 - 89% of the time - Needs redirection for appropriate language or to initiate interaction.  Problem Solving Problem solving assist level: Solves basic 50 - 74% of the time/requires cueing 25 - 49% of the time  Memory Memory assist level: Recognizes or recalls 50 - 74% of the time/requires cueing 25 - 49% of the time   Carmelia Roller., CCC-SLP [phone]  [place] 02/26/2016, 5:12 PM

## 2016-02-26 NOTE — Progress Notes (Signed)
Social Work Patient ID: [name], male   DOB: 12-31-1960, 56 y.o.   MRN: [id number]  Bed offered via [facility] for tomorrow. Brother and sister in-law coming to transport pt to facility. Will gather paperwork and see in am. Team and MD made aware of this. Pt is in agreement with this plan also.

## 2016-02-26 NOTE — Progress Notes (Signed)
Physical Therapy Discharge Summary  Patient Details  Name: [name] MRN: [id number] Date of Birth: [birth year]  Today's Date: 02/26/2016 PT Individual Time: 1003-1100 PT Individual Time Calculation (min): 57 min    Patient has met 11 of 11 long term goals due to improved activity tolerance, improved balance, improved postural control, increased strength, ability to compensate for deficits, functional use of  right upper extremity and right lower extremity, improved attention, improved awareness and improved coordination.  Patient to discharge at an ambulatory level Supervision with RW & R hand orthosis.   Pt requires 24 hr supervision upon d/c.  Reasons goals not met: n/a  Recommendation:  Patient will benefit from ongoing skilled PT services in skilled nursing facility setting to continue to advance safe functional mobility, address ongoing impairments in decreased balance, impaired gait, impaired safety awareness, decreased strength & endurance, and minimize fall risk.  Equipment: To be determined in next venue of care; pt currently requires RW with R hand orthosis  Reasons for discharge: treatment goals met and discharge from hospital  Patient/family agrees with progress made and goals achieved: Yes  Skilled PT Treatment Patient received in w/c in room & agreeable to tx, denying c/o pain. Pt reported need to use restroom & ambulated within room to bathroom with RW & supervision assist. Pt performed sit<>stand transfer on elevated commode seat with close supervision but did not void or have BM. Session then focused on grad day activities. Pt ambulated over even and uneven surfaces with RW & supervision and steady assist respectively. Pt negotiated ramp with RW, performed car transfers, & stair negotiation with B rails and supervision. Pt completed bed mobility with mod I. Pt requires increased time to complete all tasks.  At end of session pt left sitting in w/c in room with QRB donned  & all needs within reach.   Pt with very minimal muscle activation in R dorsiflexors and hip flexors which cause him to demonstrate impaired toe clearance, heel strike, and hip flexion during gait. 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  PT Discharge Precautions/Restrictions Precautions Precautions: Fall Precaution Comments: R hemiplegia Restrictions Weight Bearing Restrictions: No  Pain Pain Assessment Pain Assessment: No/denies pain  Vision/Perception  Pt reports he wore glasses at all times PTA but does not have them donned during session. Pt reports changes in vision; pt reports impaired depth perception.  Cognition Overall Cognitive Status: Impaired/Different from baseline Arousal/Alertness: Awake/alert Orientation Level: Oriented X4 Safety/Judgment: Impaired (requires cuing for safety)  Sensation Sensation Light Touch: Appears Intact (BLE) Proprioception: Impaired by gross assessment Proprioception Impaired Details: Impaired RLE Coordination Gross Motor Movements are Fluid and Coordinated:  (improved since admission) Fine Motor Movements are Fluid and Coordinated: No  Motor  Motor Motor: Hemiplegia Motor - Skilled Clinical Observations: R hemiplegia, general weakness   Mobility Bed Mobility Bed Mobility: Rolling Right;Rolling Left;Supine to Sit;Sit to Supine Rolling Right: 5: Supervision Rolling Left: 5: Supervision Supine to Sit: 5: Supervision Sit to Supine: 5: Supervision Transfers Transfers: Yes (requires occasional cuing for management of R hand orthosis strap) Sit to Stand: 5: Supervision Sit to Stand Details: Verbal cues for technique;Verbal cues for precautions/safety Stand to Sit: 5: Supervision Stand to Sit Details (indicate cue type and reason): Verbal cues for precautions/safety;Verbal cues for technique  Locomotion  Ambulation Ambulation: Yes Ambulation/Gait Assistance: 5: Supervision Ambulation Distance (Feet): 150 Feet Assistive  device: Rolling walker (with R hand orthosis) Gait Gait: Yes Gait Pattern: Decreased hip/knee flexion - right;Decreased stride length;Decreased dorsiflexion -  right;Decreased step length - right (decreased weight shifting L, decreased gait speed) Stairs / Additional Locomotion Stairs: Yes Stairs Assistance: 5: Supervision Stairs Assistance Details:  (occasional cuing for RUE hand placement) Stair Management Technique: Two rails Number of Stairs: 24 Height of Stairs:  (6" + 3") Ramp: 5: Supervision (ambulatory with RW) Wheelchair Mobility Wheelchair Mobility: No   Trunk/Postural Assessment  Postural Control Postural Control: Deficits on evaluation Protective Responses: impaired postural righting reactions   Balance Balance Balance Assessed: Yes Dynamic Standing Balance Dynamic Standing - Balance Support: Bilateral upper extremity supported Dynamic Standing - Level of Assistance: 5: Stand by assistance   Extremity Assessment  RLE Assessment RLE Assessment:  (ROM WFL) RLE Strength RLE Overall Strength Comments: 1/5 hip flexion & ankle dorsiflexion, 3-/5 knee flexion & extension LLE Assessment LLE Assessment: Within Functional Limits   See Function Navigator for Current Functional Status.  Waunita Schooner 02/26/2016, 3:42 PM

## 2016-02-26 NOTE — Discharge Summary (Signed)
Discharge summary job # [id number]

## 2016-02-27 LAB — GLUCOSE, CAPILLARY: Glucose-Capillary: 82 mg/dL (ref 65–99)

## 2016-02-27 NOTE — Progress Notes (Signed)
Social Work  Discharge Note  The overall goal for the admission was met for:   Discharge location: [facility]  Length of Stay: Yes-16 DAYS  Discharge activity level: Yes-MIN ASSIST LEVEL  Home/community participation: Yes  Services provided included: MD, RD, PT, OT, SLP, RN, CM, TR, Pharmacy and SW  Financial Services: Medicare and Private Insurance: FED BCBS  Follow-up services arranged: Other: NHP  Comments (or additional information):PT HAS NO ONE TO PROVIDE 24 HR CARE THUS NEEDED TO GO TO A NH-FOUND FACILITY CLOSER TO BROTHER AND SISTER IN-LAW IN [place]. HOPEFUL CAN GO FROM SNF TO ALF ONCE REACHED SUPERVISION-MOD/I LEVEL. BROTHER AND SISTER IN-LAW TO TRANSPORT TO NH  Patient/Family verbalized understanding of follow-up arrangements: Yes  Individual responsible for coordination of the follow-up plan: MITCHELL-BROTHER  Confirmed correct DME delivered: Elease Hashimoto 02/27/2016    Elease Hashimoto

## 2016-02-27 NOTE — Progress Notes (Signed)
Report given to [facility], admissions nurse at [facility].  Dani Gobble, RN

## 2016-02-27 NOTE — Plan of Care (Signed)
Problem: RH KNOWLEDGE DEFICIT Goal: RH STG INCREASE KNOWLEDGE OF DIABETES Patient will be able to describe how to check his blood sugar and interpret results. Patient will be able to describe appropriate dietary choices for DM management. 50% accuracy with Mod assist.   Outcome: Not Applicable Date Met: 02/11/30 D/c to SNF Goal: RH STG INCREASE KNOWLEDGE OF HYPERTENSION Patient will be able to list normal blood pressure parameters, his usual blood pressure. Patient will be able to list medicines taking for blood pressure and when to administer them. Patient will be able to explain why BP monitoring is necessary to prevent future stroke. 50% accuracy with Mod assist.   Outcome: Not Applicable Date Met: 35/57/32 D/c to snf

## 2016-02-27 NOTE — Progress Notes (Signed)
Subjective/Complaints: Pt aware that he is leaving today   ROS: pt denies nausea, vomiting, diarrhea, cough, shortness of breath or chest pain   Objective: Vital Signs: Blood pressure (!) 154/89, pulse 66, temperature 99.1 F (37.3 C), temperature source Oral, resp. rate 17, height 5\' 11"  (1.803 m), weight 93.7 kg (206 lb 9.1 oz), SpO2 97 %. No results found. Results for orders placed or performed during the hospital encounter of 02/11/16 (from the past 72 hour(s))  Glucose, capillary     Status: Abnormal   Collection Time: 02/24/16 11:29 AM  Result Value Ref Range   Glucose-Capillary 104 (H) 65 - 99 mg/dL  Glucose, capillary     Status: Abnormal   Collection Time: 02/24/16  5:05 PM  Result Value Ref Range   Glucose-Capillary 114 (H) 65 - 99 mg/dL  Glucose, capillary     Status: Abnormal   Collection Time: 02/24/16  8:37 PM  Result Value Ref Range   Glucose-Capillary 142 (H) 65 - 99 mg/dL  Glucose, capillary     Status: None   Collection Time: 02/25/16  6:35 AM  Result Value Ref Range   Glucose-Capillary 84 65 - 99 mg/dL  Glucose, capillary     Status: Abnormal   Collection Time: 02/25/16 12:12 PM  Result Value Ref Range   Glucose-Capillary 101 (H) 65 - 99 mg/dL  Glucose, capillary     Status: Abnormal   Collection Time: 02/25/16  4:41 PM  Result Value Ref Range   Glucose-Capillary 137 (H) 65 - 99 mg/dL  Glucose, capillary     Status: Abnormal   Collection Time: 02/25/16  9:45 PM  Result Value Ref Range   Glucose-Capillary 129 (H) 65 - 99 mg/dL  Glucose, capillary     Status: None   Collection Time: 02/26/16  6:25 AM  Result Value Ref Range   Glucose-Capillary 79 65 - 99 mg/dL  Glucose, capillary     Status: None   Collection Time: 02/26/16 11:24 AM  Result Value Ref Range   Glucose-Capillary 98 65 - 99 mg/dL  Glucose, capillary     Status: Abnormal   Collection Time: 02/26/16  4:34 PM  Result Value Ref Range   Glucose-Capillary 145 (H) 65 - 99 mg/dL  Glucose,  capillary     Status: Abnormal   Collection Time: 02/26/16  8:45 PM  Result Value Ref Range   Glucose-Capillary 150 (H) 65 - 99 mg/dL  Glucose, capillary     Status: None   Collection Time: 02/27/16  6:46 AM  Result Value Ref Range   Glucose-Capillary 82 65 - 99 mg/dL     HEENT: Normocephaic, atraumatic Cardio: RRR Resp: CTA bilat GI: BS positive and NT, ND Skin:   Intact. Warm and dry.  Neuro: alert.   Flat Motor: 4/5 LUE proximal to distal LLE: 4/5 proxima to distal  RUE: 4-/5 proximal to distal RLE: 4-/5 HF KE ADF Dysarthric speech but intelligbile  Musc/Skel:  No tenderness. Right foot Edema, no pain with bilateral shoulder ROMGen NAD.     Assessment/Plan: 1. Functional deficits secondary to Left MCA infarct with aphasia, Right hemiparesis, dysarthria D/C to SNF today. FIM: Function - Bathing Position: Wheelchair/chair at sink Body parts bathed by patient: Right arm, Left arm, Chest, Abdomen, Front perineal area, Buttocks, Right upper leg, Left upper leg, Right lower leg, Left lower leg, Back Body parts bathed by helper: Left arm, Back Bathing not applicable: Left lower leg Assist Level: Supervision or verbal cues  Function- Upper  Body Dressing/Undressing What is the patient wearing?: Pull over shirt/dress Pull over shirt/dress - Perfomed by patient: Thread/unthread left sleeve, Put head through opening, Pull shirt over trunk, Thread/unthread right sleeve Pull over shirt/dress - Perfomed by helper: Thread/unthread right sleeve Assist Level: Supervision or verbal cues Function - Lower Body Dressing/Undressing What is the patient wearing?: Underwear, Pants, Non-skid slipper socks, Ted Hose Position:  (bedside chair) Underwear - Performed by patient: Thread/unthread left underwear leg, Thread/unthread right underwear leg, Pull underwear up/down Underwear - Performed by helper: Thread/unthread right underwear leg Pants- Performed by patient: Thread/unthread right pants  leg, Thread/unthread left pants leg, Pull pants up/down Pants- Performed by helper: Thread/unthread right pants leg Non-skid slipper socks- Performed by patient: Don/doff right sock, Don/doff left sock Non-skid slipper socks- Performed by helper: Don/doff left sock, Don/doff right sock TED Hose - Performed by helper: Don/doff right TED hose, Don/doff left TED hose Assist for footwear: Supervision/touching assist Assist for lower body dressing: Supervision or verbal cues  Function - Toileting Toileting activity did not occur: No continent bowel/bladder event Toileting steps completed by patient: Performs perineal hygiene Toileting steps completed by helper: Adjust clothing prior to toileting, Adjust clothing after toileting Toileting Assistive Devices: Grab bar or rail Assist level: Supervision or verbal cues  Function - Archivist transfer activity did not occur: Refused Toilet transfer assistive device: Elevated toilet seat/BSC over toilet, Walker Assist level to toilet: Touching or steadying assistance (Pt > 75%) Assist level from toilet: Touching or steadying assistance (Pt > 75%)  Function - Chair/bed transfer Chair/bed transfer method: Ambulatory Chair/bed transfer assist level: Supervision or verbal cues Chair/bed transfer assistive device: Armrests, Walker Chair/bed transfer details: Visual cues/gestures for precautions/safety  Function - Locomotion: Wheelchair Will patient use wheelchair at discharge?: No Type: Manual Wheelchair activity did not occur: Safety/medical concerns Max wheelchair distance: 100 Assist Level: Touching or steadying assistance (Pt > 75%) Wheel 50 feet with 2 turns activity did not occur: Safety/medical concerns Assist Level: Touching or steadying assistance (Pt > 75%) Wheel 150 feet activity did not occur: Safety/medical concerns Turns around,maneuvers to table,bed, and toilet,negotiates 3% grade,maneuvers on rugs and over doorsills:  No Function - Locomotion: Ambulation Assistive device: Walker-rolling, Orthosis (right hand orthosis) Max distance: 150 ft Assist level: Supervision or verbal cues Assist level: Supervision or verbal cues Assist level: Supervision or verbal cues Assist level: Supervision or verbal cues Walk 10 feet on uneven surfaces activity did not occur: Safety/medical concerns Assist level: Touching or steadying assistance (Pt > 75%)  Function - Comprehension Comprehension: Auditory Comprehension assist level: Understands basic 90% of the time/cues < 10% of the time  Function - Expression Expression: Verbal Expression assist level: Expresses basic 50 - 74% of the time/requires cueing 25 - 49% of the time. Needs to repeat parts of sentences.  Function - Social Interaction Social Interaction assist level: Interacts appropriately 75 - 89% of the time - Needs redirection for appropriate language or to initiate interaction.  Function - Problem Solving Problem solving assist level: Solves basic 50 - 74% of the time/requires cueing 25 - 49% of the time  Function - Memory Memory assist level: Recognizes or recalls 50 - 74% of the time/requires cueing 25 - 49% of the time Patient normally able to recall (first 3 days only): Current season, That he or she is in a hospital  Medical Problem List and Plan: 1.  Right hemiparesis, aphasia, dysphagia dysarthria secondary to left MCA infarct  Cont CIR PT, OT SLP- D/C to SNF  today  2.  DVT Prophylaxis/Anticoagulation: Lovenox 40 mg daily 3. Pain Management: Tylenol as needed,4. Mood: Celexa 40 mg daily 5. Neuropsych: This patient is capable of making decisions on he is own behalf. 6. Skin/Wound Care: Routine skin checks 7. Fluids/Electrolytes/Nutrition:               Dysphagia 3 thin, per SLP, no choking or coughing reported 8. Acute on chronic combined congestive heart failure. Continue Lasix. Monitor for any signs of fluid overload.  -no clinical signs of  exacerbation  Filed Weights   02/24/16 0507 02/25/16 0446 02/26/16 0606  Weight: 93.3 kg (205 lb 11 oz) 93.8 kg (206 lb 12.7 oz) 93.7 kg (206 lb 9.1 oz)  9. Hypertension. Cardura 4 mg daily, Imdur 30 mg daily, Coreg 50 mg twice a day, lisinopril 20 mg daily.  , at home also took Amlodipine, apresoline, aldactone. Still elevated on amlodipine 5mg , increased to 10mg  on 1/19,  Vitals:   02/26/16 2340 02/27/16 0654  BP:  (!) 154/89  Pulse: 71 66  Resp: 18 17  Temp:  99.1 F (37.3 C)    10. Diabetes mellitus with peripheral neuropathy. Hemoglobin A1c 6.4. Check blood sugars before meals and at bedtime. Diabetic teaching   CBGs  improving  CBG (last 3)   Recent Labs  02/26/16 1634 02/26/16 2045 02/27/16 0646  GLUCAP 145* 150* 82    11. Ulcerative colitis. Sulfasalazine 2000 mg twice a day, no diarrhea, cont of bowel 12. BPH. Flomax 0.4 mg daily at bedtime. Check PVR 3 13. Hyperlipidemia. Lipitor 14. CRI stage III. Creatinine 1.50-1.58, remains, avoid NSAIDs and Metformin, proteinuria noted  BMP stable 15. LE edema/Lymphedema: per PT, will d/c wraps and cont TEDs. Hypoalbuminemia is contributing-  -continue prostat  LOS (Days) 16 A FACE TO FACE EVALUATION WAS PERFORMED  [name] 02/27/2016, 7:12 AM

## 2016-02-27 NOTE — Progress Notes (Signed)
Patient discharged to SNF via family.  Patient and family verbalized understanding of discharge instructions as given by Dossie Der, SW.  Attempted to call report to facility with no answer.  Left message to call.  Patient left floor via wheelchair, escorted by nursing staff and family.  All patient belongings sent with patient.  Appears to be in no immediate distress at this time.  Dani Gobble, RN

## 2016-03-10 ENCOUNTER — Ambulatory Visit (INDEPENDENT_AMBULATORY_CARE_PROVIDER_SITE_OTHER): Payer: Medicare Other | Admitting: *Deleted

## 2016-03-10 DIAGNOSIS — Z4509 Encounter for adjustment and management of other cardiac device: Secondary | ICD-10-CM

## 2016-03-11 NOTE — Progress Notes (Signed)
Carelink Summary Report / Loop Recorder 

## 2016-03-25 NOTE — Progress Notes (Deleted)
Electrophysiology Office Note Date: 03/25/2016  ID:  [name], DOB 1960/07/07, MRN [id number]  PCP: [facility] Primary Cardiologist: Anne Fu Electrophysiologist: Graciela Husbands  CC: hospital follow up  [name] is a 56 y.o. male seen today for Dr Graciela Husbands.  He was recently admitted with acute stroke.  He was found to have LV dysfunction by echo and underwent ILR prior to discharge. He participated in CIR and presents today for post hospital follow up.  Since discharge, the patient reports doing *** well.  He denies chest pain, palpitations, dyspnea, PND, orthopnea, nausea, vomiting, dizziness, syncope, edema, weight gain, or early satiety.  Past Medical History:  Diagnosis Date  . BPH (benign prostatic hyperplasia)   . CHF (congestive heart failure) (HCC)   . Diabetes mellitus without complication (HCC)   . Diabetic retinopathy associated with type 2 diabetes mellitus (HCC)   . Hypertension   . Lymphedema   . OSA (obstructive sleep apnea)   . TIA (transient ischemic attack)    Past Surgical History:  Procedure Laterality Date  . BLADDER SURGERY    . CARDIAC CATHETERIZATION    . EP IMPLANTABLE DEVICE N/A 02/08/2016   Procedure: Loop Recorder Insertion;  Surgeon: Duke Salvia, MD;  Location: [facility] INVASIVE CV LAB;  Service: Cardiovascular;  Laterality: N/A;  . TEE WITHOUT CARDIOVERSION N/A 02/08/2016   Procedure: TRANSESOPHAGEAL ECHOCARDIOGRAM (TEE);  Surgeon: Vesta Mixer, MD;  Location: [facility] ENDOSCOPY;  Service: Cardiovascular;  Laterality: N/A;    Current Outpatient Prescriptions  Medication Sig Dispense Refill  . acetaminophen (TYLENOL) 325 MG tablet Take 650 mg by mouth 3 (three) times daily as needed for mild pain, moderate pain or headache.     Marland Kitchen aspirin 325 MG tablet Take 325 mg by mouth daily.    Marland Kitchen atorvastatin (LIPITOR) 40 MG tablet Take 20 mg by mouth at bedtime.     . Calcium Carbonate-Vitamin D (CALCIUM-VITAMIN D) 500-200 MG-UNIT tablet Take 1 tablet by  mouth daily.     . carvedilol (COREG) 25 MG tablet Take 50 mg by mouth 2 (two) times daily.     . citalopram (CELEXA) 40 MG tablet Take 40 mg by mouth daily.    Marland Kitchen docusate sodium (COLACE) 100 MG capsule Take 100 mg by mouth daily as needed for mild constipation.    Marland Kitchen doxazosin (CARDURA) 4 MG tablet Take 1 tablet (4 mg total) by mouth at bedtime. 30 tablet 0  . ferrous sulfate 325 (65 FE) MG tablet Take 325 mg by mouth every Monday, Wednesday, and Friday.     . furosemide (LASIX) 80 MG tablet Take 1 tablet (80 mg total) by mouth daily. 30 tablet 2  . hydrALAZINE (APRESOLINE) 100 MG tablet Take 0.5 tablets (50 mg total) by mouth 3 (three) times daily. 90 tablet 1  . hydrocerin (EUCERIN) CREA Apply 1 application topically daily.    . insulin aspart (NOVOLOG) 100 UNIT/ML injection Inject 3 Units into the skin 3 (three) times daily before meals.     . insulin glargine (LANTUS) 100 UNIT/ML injection Inject 0.05 mLs (5 Units total) into the skin at bedtime. 10 mL 11  . ipratropium (ATROVENT) 0.02 % nebulizer solution Take 2.5 mLs (0.5 mg total) by nebulization every 2 (two) hours as needed for wheezing or shortness of breath. (Patient not taking: Reported on 02/06/2016) 75 mL 0  . isosorbide mononitrate (IMDUR) 30 MG 24 hr tablet Take 30 mg by mouth daily.    Marland Kitchen lisinopril (PRINIVIL,ZESTRIL) 20 MG  tablet Take 1 tablet (20 mg total) by mouth daily. 30 tablet 2  . Multiple Vitamin (MULTIVITAMIN WITH MINERALS) TABS tablet Take 1 tablet by mouth daily.    . ondansetron (ZOFRAN ODT) 4 MG disintegrating tablet Take 1 tablet (4 mg total) by mouth every 8 (eight) hours as needed for nausea or vomiting. (Patient not taking: Reported on 02/06/2016) 10 tablet 0  . oxybutynin (DITROPAN) 5 MG tablet Take 5 mg by mouth 3 (three) times daily.    . pantoprazole (PROTONIX) 40 MG tablet Take 40 mg by mouth daily. Take on an empty stomach 30 minutes before a meal    . polyethylene glycol (MIRALAX / GLYCOLAX) packet Take 17 g by  mouth daily as needed for mild constipation or moderate constipation.    . potassium chloride SA (K-DUR,KLOR-CON) 20 MEQ tablet Take 2 tablets (40 mEq total) by mouth daily. 60 tablet 0  . silver sulfADIAZINE (SILVADENE) 1 % cream Apply 1 application topically daily as needed (skin care).     Marland Kitchen spironolactone (ALDACTONE) 25 MG tablet Take 1 tablet (25 mg total) by mouth daily. 30 tablet 0  . sulfaSALAzine (AZULFIDINE) 500 MG tablet Take 2,000 mg by mouth 2 (two) times daily after a meal.     . tamsulosin (FLOMAX) 0.4 MG CAPS capsule Take 0.4 mg by mouth at bedtime.      No current facility-administered medications for this visit.     Allergies:   Chicken allergy and Sildenafil   Social History: Social History   Social History  . Marital status: Legally Separated    Spouse name: N/A  . Number of children: N/A  . Years of education: N/A   Occupational History  . Disabled    Social History Main Topics  . Smoking status: Never Smoker  . Smokeless tobacco: Never Used  . Alcohol use No  . Drug use: No  . Sexual activity: Not on file   Other Topics Concern  . Not on file   Social History Narrative  . No narrative on file    Family History: Family History  Problem Relation Age of Onset  . Cancer Mother   . Cancer Father     Review of Systems: All other systems reviewed and are otherwise negative except as noted above.   Physical Exam: VS:  There were no vitals taken for this visit. , BMI There is no height or weight on file to calculate BMI. Wt Readings from Last 3 Encounters:  02/27/16 203 lb 4.2 oz (92.2 kg)  02/11/16 212 lb 8 oz (96.4 kg)  07/19/15 235 lb (106.6 kg)    GEN- The patient is well appearing, alert and oriented x 3 today.   HEENT: normocephalic, atraumatic; sclera clear, conjunctiva pink; hearing intact; oropharynx clear; neck supple, no JVP Lymph- no cervical lymphadenopathy Lungs- Clear to ausculation bilaterally, normal work of breathing.  No  wheezes, rales, rhonchi Heart- Regular rate and rhythm, no murmurs, rubs or gallops, PMI not laterally displaced GI- soft, non-tender, non-distended, bowel sounds present, no hepatosplenomegaly Extremities- no clubbing, cyanosis, or edema; DP/PT/radial pulses 2+ bilaterally MS- no significant deformity or atrophy Skin- warm and dry, no rash or lesion  Psych- euthymic mood, full affect Neuro- strength and sensation are intact   EKG:  EKG is not ordered today.  Recent Labs: 02/05/2016: B Natriuretic Peptide 3,370.8 02/06/2016: Magnesium 1.9 02/12/2016: ALT 15; Hemoglobin 11.3; Platelets 189 02/18/2016: BUN 21; Creatinine, Ser 1.41; Potassium 3.9; Sodium 141    Other studies  Reviewed: Additional studies/ records that were reviewed today include: hospital records   Assessment and Plan: 1.  Cryptogenic stroke S/p ILR No AF to date Will continue to monitor remotely  2.  LV dysfunction Continue medical therapy Needs ischemic eval *** defer for now until further recovered from stroke  3.  CKD, stage III Stable No change required today  4.  Hypertensive cardiovascular disease with CHF Stable No change required today    Current medicines are reviewed at length with the patient today.   The patient {ACTIONS; HAS/DOES NOT HAVE:19233} concerns regarding his medicines.  The following changes were made today:  {NONE DEFAULTED:18576::"none"}  Labs/ tests ordered today include: BMET, CBC  No orders of the defined types were placed in this encounter.    Disposition:   Follow up with Dr Anne Fu in 6 weeks, EP will follow ILR remotely    Signed, Gypsy Balsam, NP 03/25/2016 12:54 PM   [facility] [address] [place] [address] [phone] (office) [phone] (fax

## 2016-03-27 ENCOUNTER — Ambulatory Visit: Payer: Medicare Other | Admitting: Nurse Practitioner

## 2016-03-28 ENCOUNTER — Encounter: Payer: Self-pay | Admitting: Nurse Practitioner

## 2016-03-28 ENCOUNTER — Inpatient Hospital Stay: Payer: Medicare Other | Admitting: Physical Medicine & Rehabilitation

## 2016-04-03 ENCOUNTER — Ambulatory Visit: Payer: Self-pay | Admitting: Neurology

## 2016-04-03 ENCOUNTER — Telehealth: Payer: Self-pay | Admitting: Cardiology

## 2016-04-03 NOTE — Telephone Encounter (Signed)
LMOVM requesting that pt send manual transmission b/c home monitor has not updated in at least 14 days.    

## 2016-04-05 LAB — CUP PACEART REMOTE DEVICE CHECK
Date Time Interrogation Session: 20180204193511
MDC IDC PG IMPLANT DT: 20180105

## 2016-04-05 NOTE — Progress Notes (Signed)
[facility] summary report received. Battery status OK. Normal device function. No new symptom episodes, tachy episodes, brady, or pause episodes. No new AF episodes. Monthly summary reports and ROV/PRN 

## 2016-04-08 ENCOUNTER — Ambulatory Visit (INDEPENDENT_AMBULATORY_CARE_PROVIDER_SITE_OTHER): Payer: Medicare Other | Admitting: *Deleted

## 2016-04-08 DIAGNOSIS — I639 Cerebral infarction, unspecified: Secondary | ICD-10-CM | POA: Diagnosis not present

## 2016-04-09 NOTE — Progress Notes (Signed)
Carelink Summary Report / Loop Recorder 

## 2016-04-10 ENCOUNTER — Encounter: Payer: Self-pay | Admitting: Cardiology

## 2016-04-17 ENCOUNTER — Encounter: Payer: Self-pay | Admitting: Cardiology

## 2016-04-28 LAB — CUP PACEART REMOTE DEVICE CHECK
MDC IDC PG IMPLANT DT: 20180105
MDC IDC SESS DTM: 20180306204033

## 2016-05-02 ENCOUNTER — Encounter: Payer: Self-pay | Admitting: Cardiology

## 2016-05-08 ENCOUNTER — Encounter: Payer: Medicare Other | Admitting: *Deleted

## 2016-06-09 ENCOUNTER — Ambulatory Visit (INDEPENDENT_AMBULATORY_CARE_PROVIDER_SITE_OTHER): Payer: Medicare Other | Admitting: *Deleted

## 2016-06-09 DIAGNOSIS — I639 Cerebral infarction, unspecified: Secondary | ICD-10-CM

## 2016-06-10 NOTE — Progress Notes (Signed)
Carelink Summary Report / Loop Recorder 

## 2016-06-24 LAB — CUP PACEART REMOTE DEVICE CHECK
Implantable Pulse Generator Implant Date: 20180105
MDC IDC SESS DTM: 20180505204415

## 2018-03-09 IMAGING — CR DG CHEST 2V
2 series · 2 of 2 positions shown · non-contrast
Comparison: None

CLINICAL DATA: Cough and chills.

EXAM:
CHEST  2 VIEW

[w chest pa]
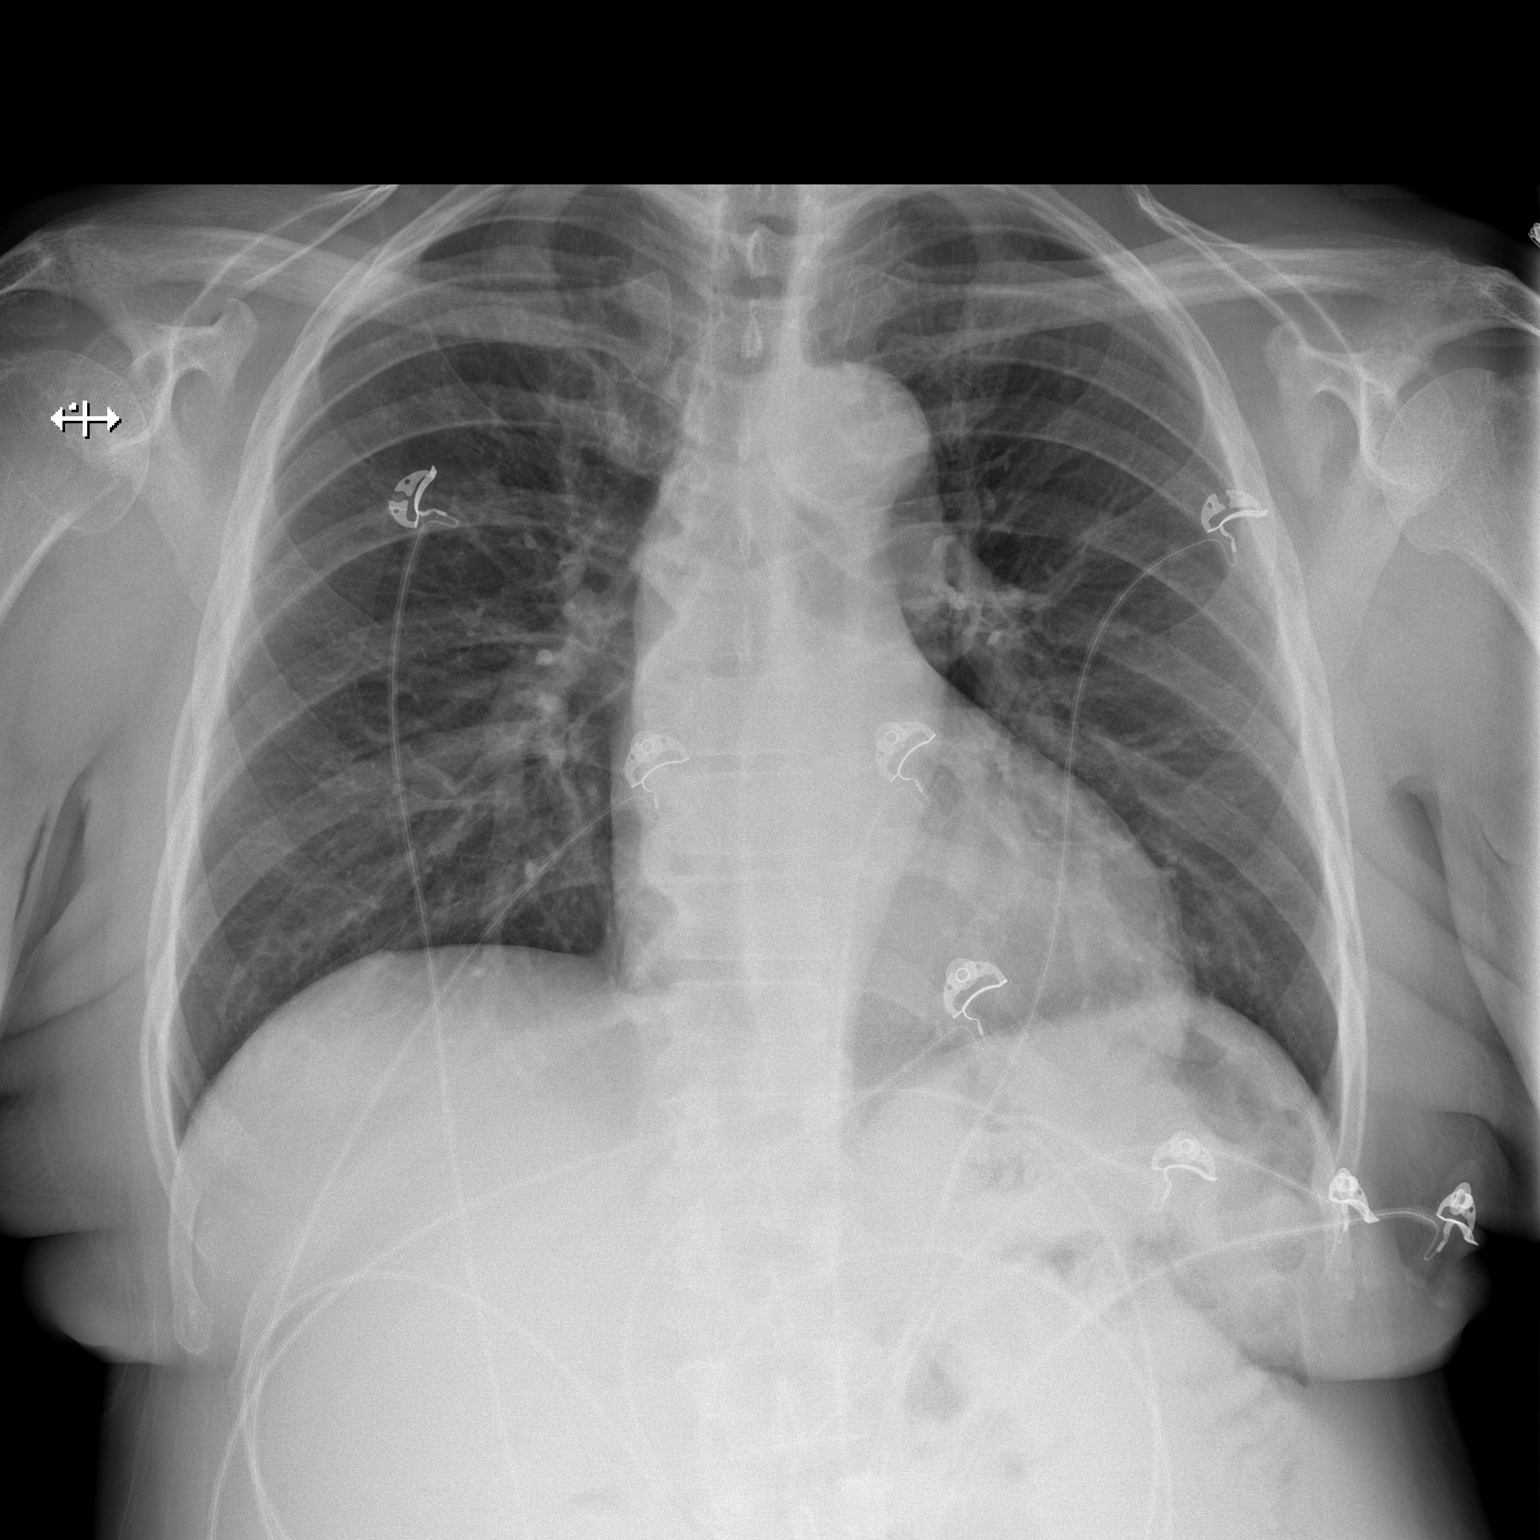

[w chest lat]
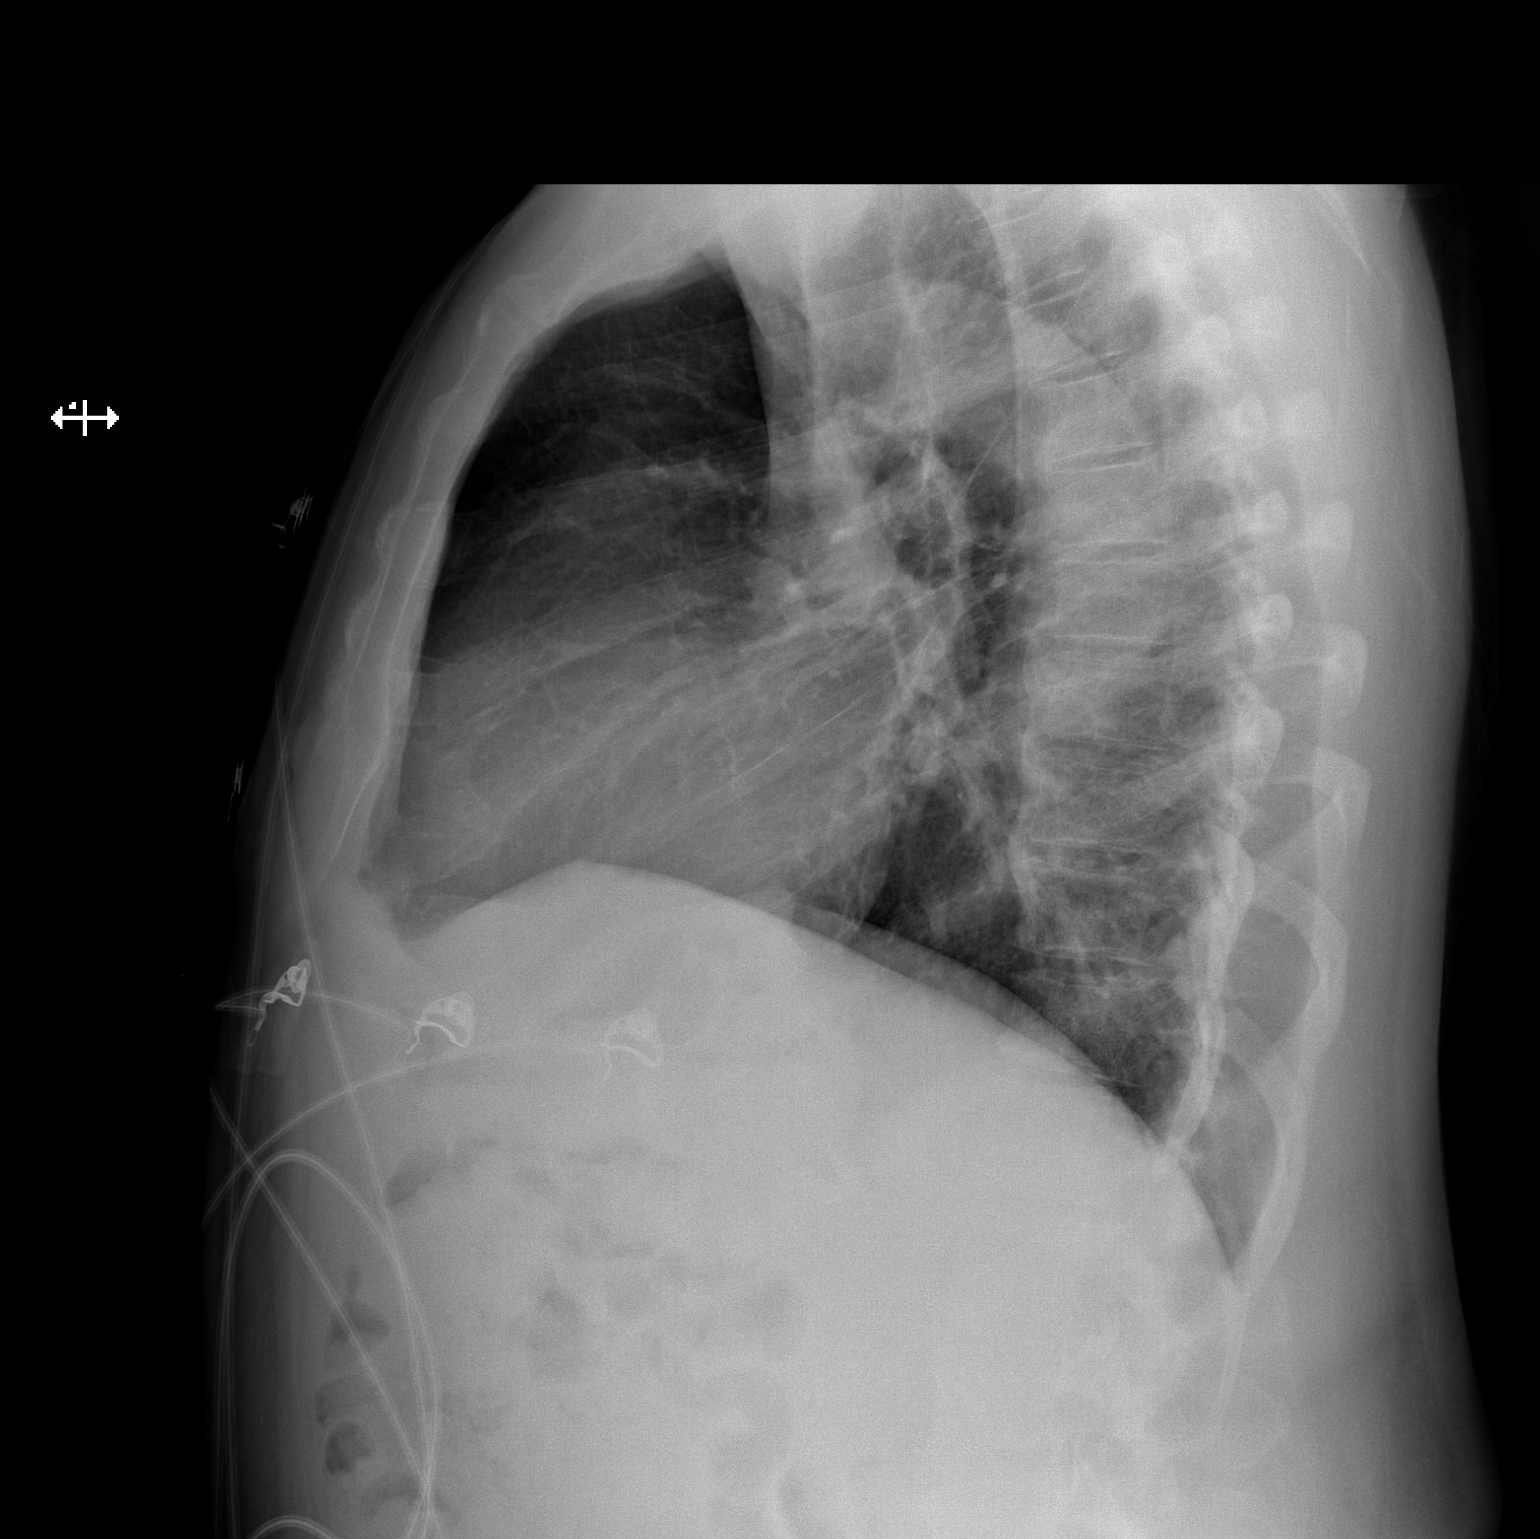

[2 of 2 positions shown; findings below may reference images not displayed]

FINDINGS: The heart size and mediastinal contours are within normal limits.
Both lungs are clear. The visualized skeletal structures are
unremarkable.
IMPRESSION: No active cardiopulmonary disease.

## 2018-06-04 DEATH — deceased

## 2018-09-20 IMAGING — MR MR HEAD W/O CM
9 of 10 series · 34 of 48 positions shown · non-contrast
Comparison: CT HEAD February 05, 2016 and MRI of the head May 20, 2015

CLINICAL DATA: Acute on chronic RIGHT-sided weakness, aphasia.
Follow-up M3 occlusion. Assess stroke versus PRES. History of
hypertension and diabetes.

EXAM:
MRI HEAD WITHOUT CONTRAST
TECHNIQUE: Multiplanar, multiecho pulse sequences of the brain and surrounding
structures were obtained without intravenous contrast.

[Series 2: FLAIR · sagittal · 5.0mm · 0.47mm/px · 3 of 23 slices shown (1 of 2)]
[im 1/23]
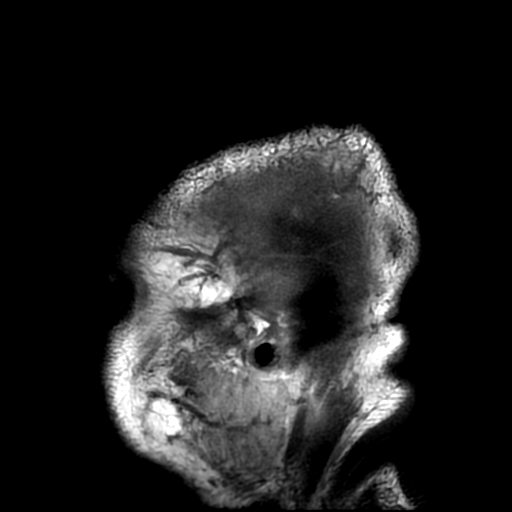
[im 12/23]
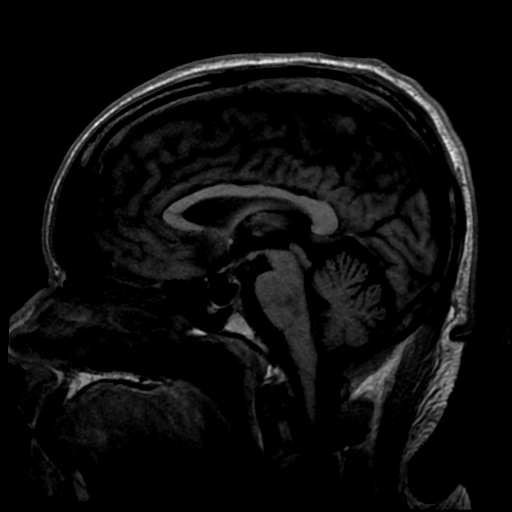
[im 23/23]
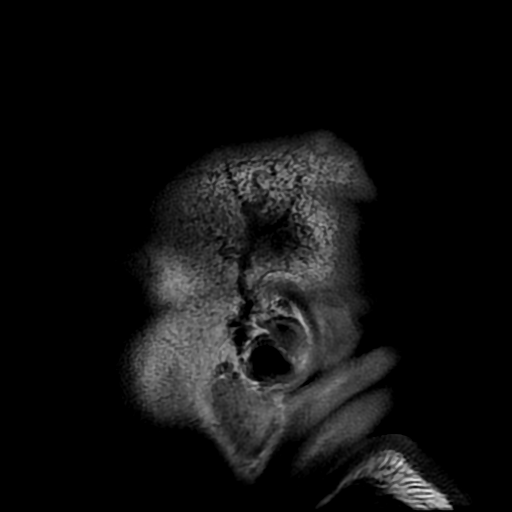

[Series 4: DWI · axial · 3.0mm · 0.94mm/px · z∈[-48,+99]mm · 8 of 100 slices shown (1 of 2)]
[im 1/100]
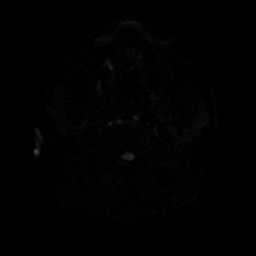
[im 12/100]
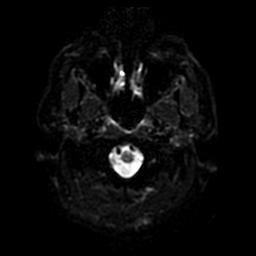
[im 34/100]
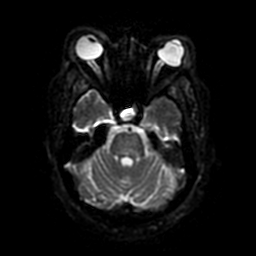
[im 45/100]
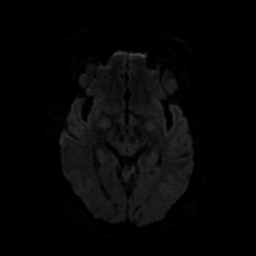
[im 56/100]
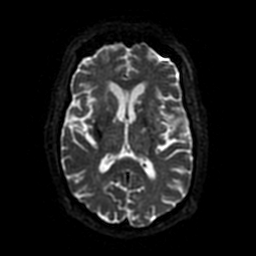
[im 67/100]
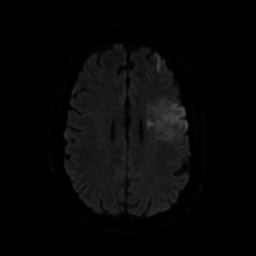
[im 89/100]
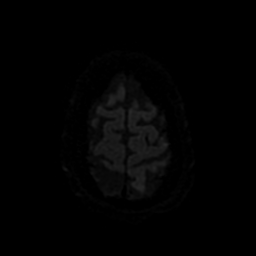
[im 100/100]
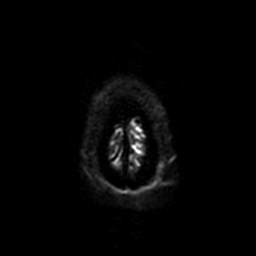

[Series 5: T2 · axial · 5.0mm · 0.43mm/px · z∈[-46,+98]mm · 2 of 25 slices shown (1 of 2)]
[im 1/25]
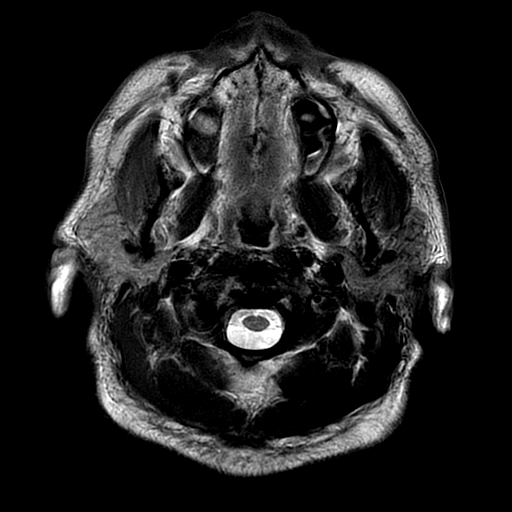
[im 25/25]
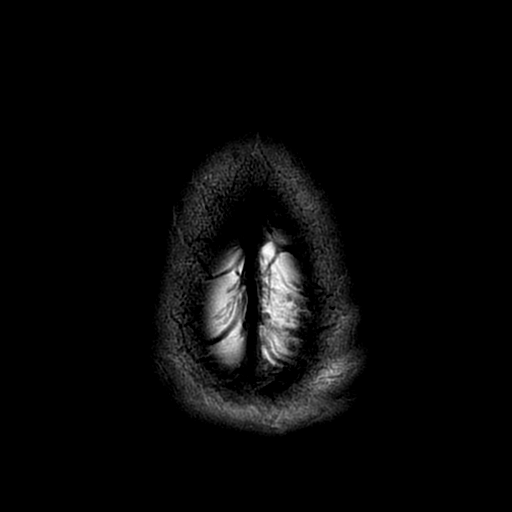

[Series 6: FLAIR · axial · 5.0mm · 0.43mm/px · z∈[-46,+98]mm · 2 of 25 slices shown (2 of 2)]
[im 1/25]
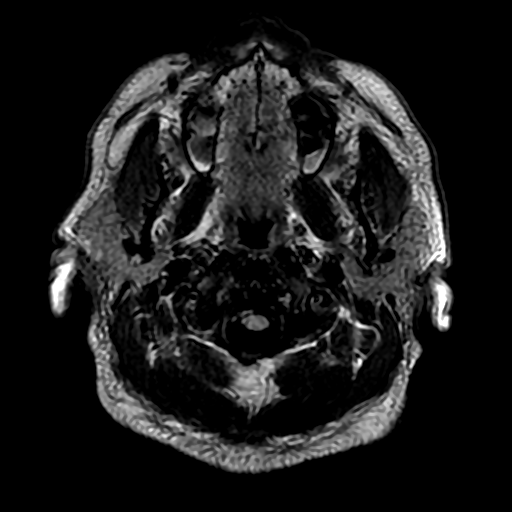
[im 25/25]
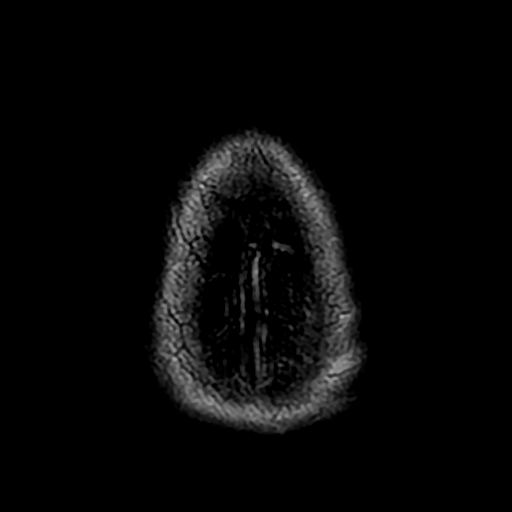

[Series 7: DWI · coronal · 4.0mm · 0.94mm/px · 7 of 74 slices shown (2 of 2)]
[im 1/74]
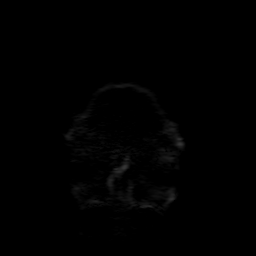
[im 13/74]
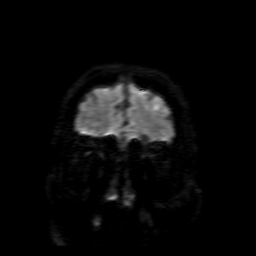
[im 25/74]
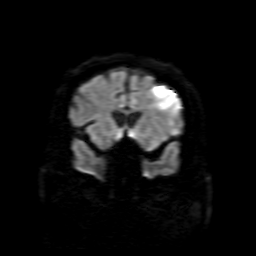
[im 37/74]
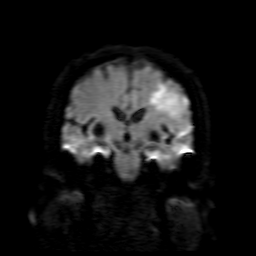
[im 49/74]
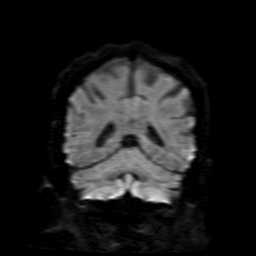
[im 61/74]
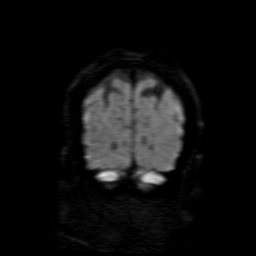
[im 74/74]
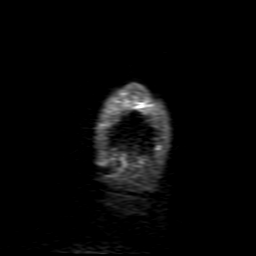

[Series 8: (person_name) · axial · 3.0mm · 0.47mm/px · z∈[-48,-30]mm · 2 of 100 slices shown]
[im 1/100]
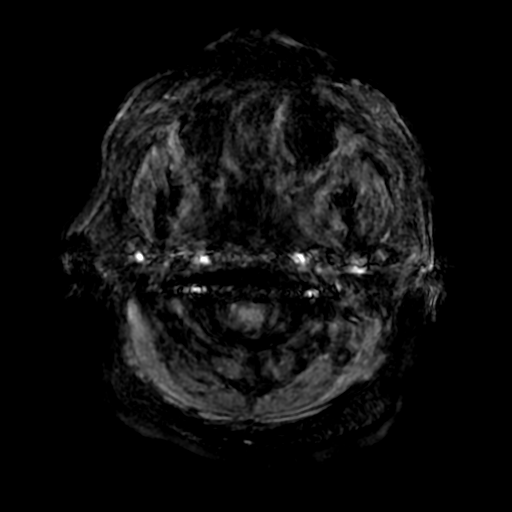
[im 13/100]
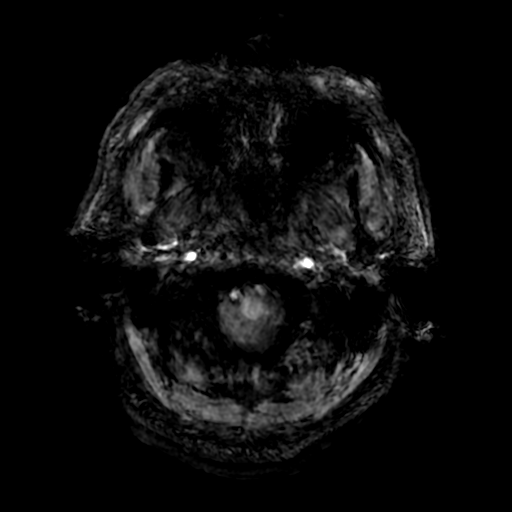

[Series 10: T2 · coronal · 5.0mm · 0.39mm/px · 3 of 31 slices shown (2 of 2)]
[im 1/31]
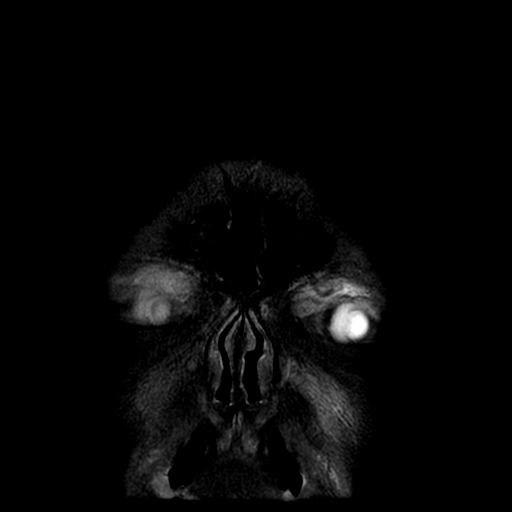
[im 16/31]
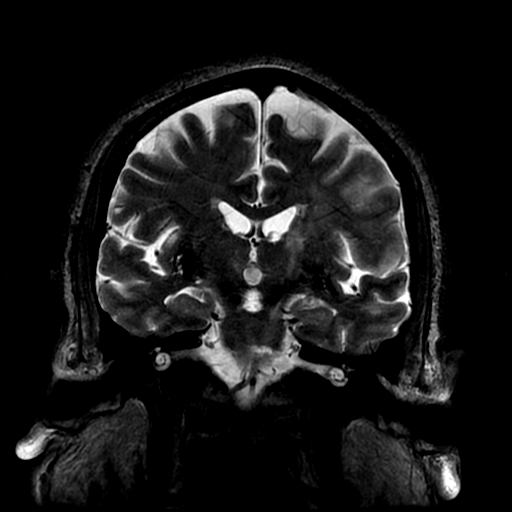
[im 31/31]
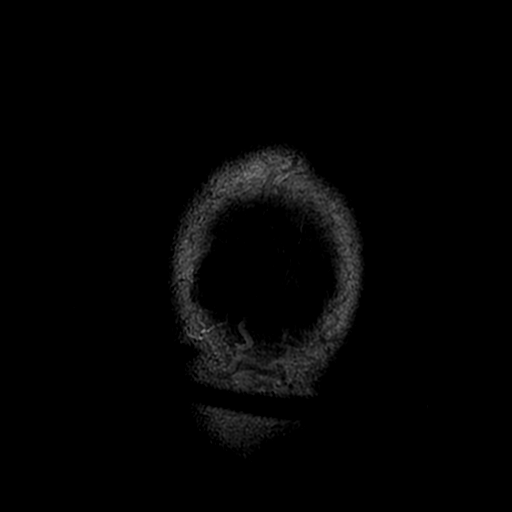

[Series 450: ADC · axial · 3.0mm · 0.94mm/px · z∈[-48,+99]mm · 4 of 47 slices shown (1 of 2)]
[im 1/47]
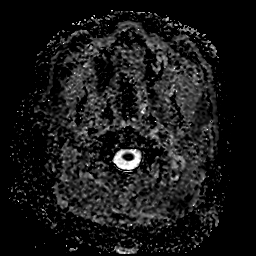
[im 16/47]
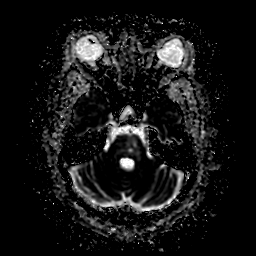
[im 31/47]
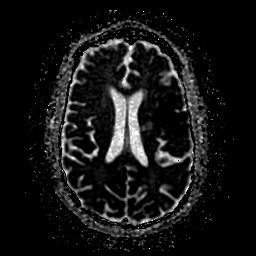
[im 47/47]
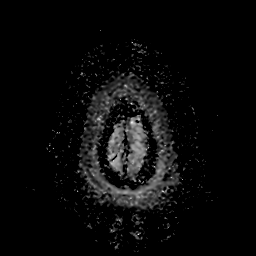

[Series 750: ADC · coronal · 4.0mm · 0.94mm/px · 3 of 37 slices shown (2 of 2)]
[im 1/37]
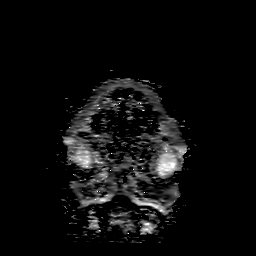
[im 19/37]
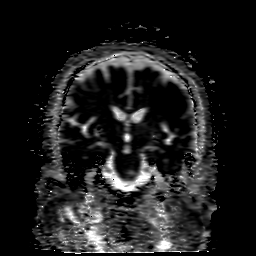
[im 37/37]
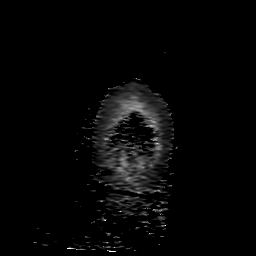

[34 of 48 positions shown; findings below may reference images not displayed]

FINDINGS: BRAIN: Confluent reduced diffusion measuring up to 5.8 cm in LEFT
frontal lobe, involving the insula and operculum with low ADC
values. Non contiguous patchy reduced diffusion LEFT anterior
frontal lobe. No susceptibility artifact to suggest acute
hemorrhage. The ventricles and sulci are normal for patient's age.
Small area RIGHT occipital lobe encephalomalacia. Old LEFT
pontomedullary infarcts. Old bilateral basal ganglia lacunar
infarcts. Symmetric basal ganglia susceptibility artifact most
consistent with old hemorrhage. Patchy supratentorial and pontine
white matter FLAIR T2 hyperintensities. Mild ventriculomegaly on the
basis of global parenchymal brain volume loss. No intraparenchymal
mass effect, masses. No abnormal extra-axial fluid collections.

VASCULAR: Normal major intracranial vascular flow voids present at
skull base.

SKULL AND UPPER CERVICAL SPINE: Empty sella. No suspicious calvarial
bone marrow signal. Craniocervical junction maintained.

SINUSES/ORBITS: Mild paranasal sinus mucosal thickening. The
included ocular globes and orbital contents are non-suspicious.
Status post LEFT scleral banding and ocular lens implant.

OTHER: None.
IMPRESSION: Acute small to moderate LEFT frontal lobe/ MCA territory infarct
corresponding to perfusion abnormality.

Old bilateral basal ganglia hemorrhagic infarcts. Old basal ganglia
and pontomedullary lacunar infarcts. Old RIGHT occipital lobe/ PCA
territory infarct.

Mild to moderate chronic small vessel ischemic disease.

Mild atrophy.

## 2018-09-20 IMAGING — RF DG SWALLOWING FUNCTION - NRPT [facility]
1 series · 18 of 24 positions shown · non-contrast
Comparison: none

[Series 1: run · 10 acquisitions, 18 frames shown]
[im 1/10]
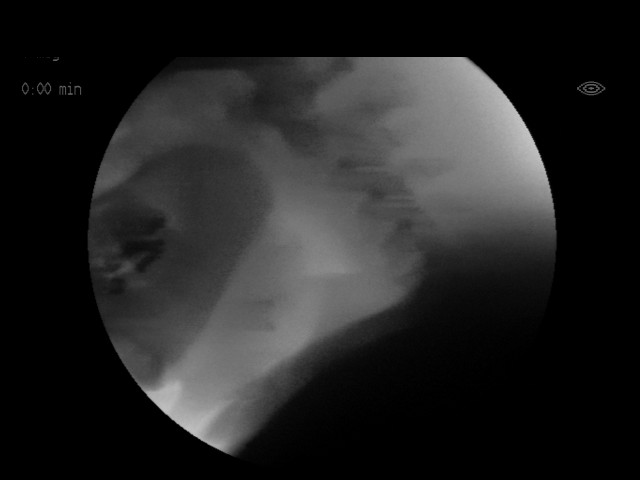
[im 1/10]
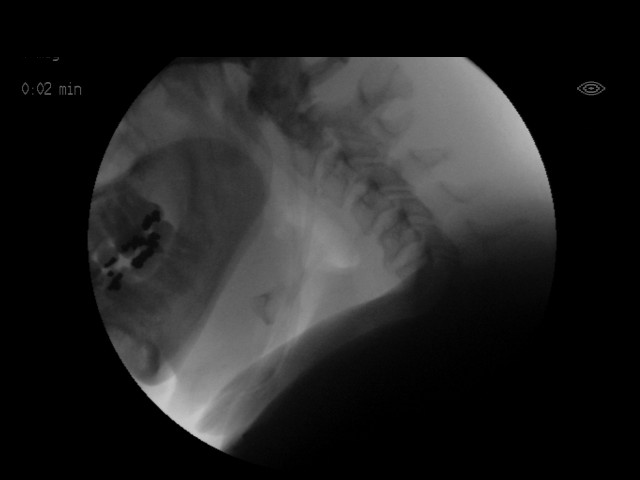
[im 2/10]
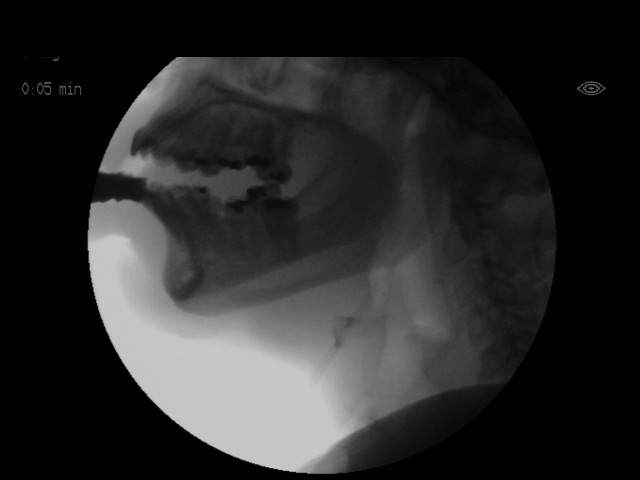
[im 2/10]
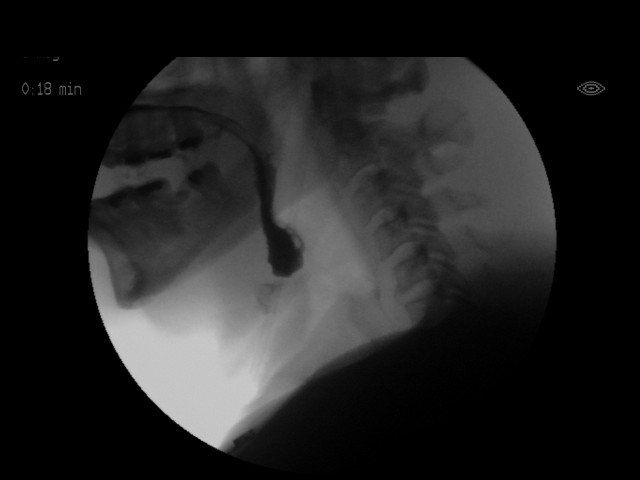
[im 3/10]
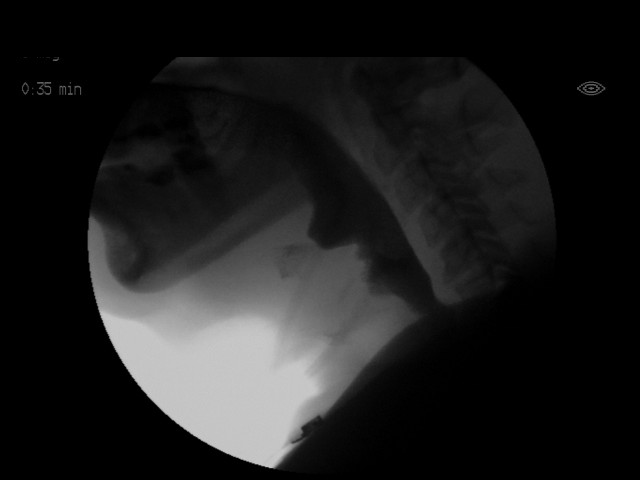
[im 4/10]
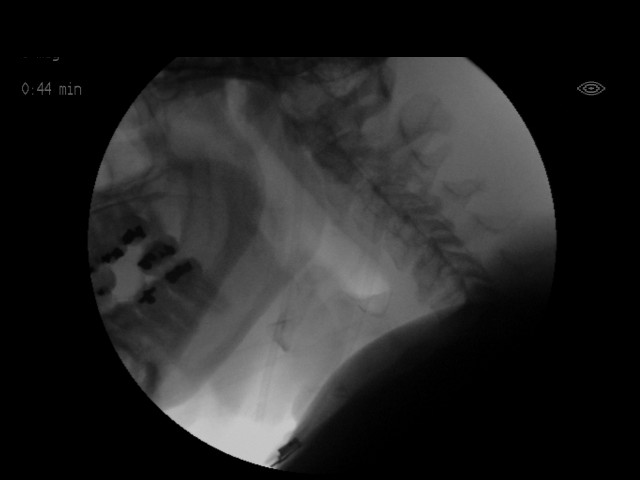
[im 4/10]
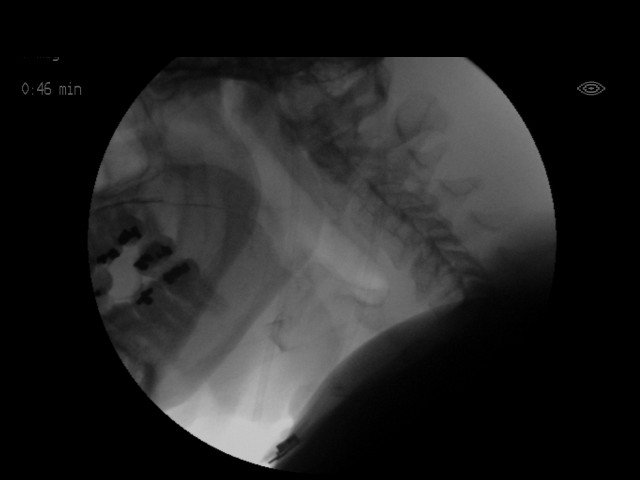
[im 5/10]
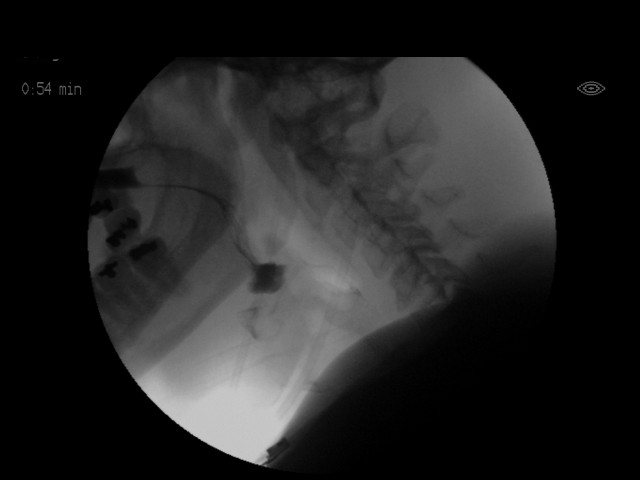
[im 5/10]
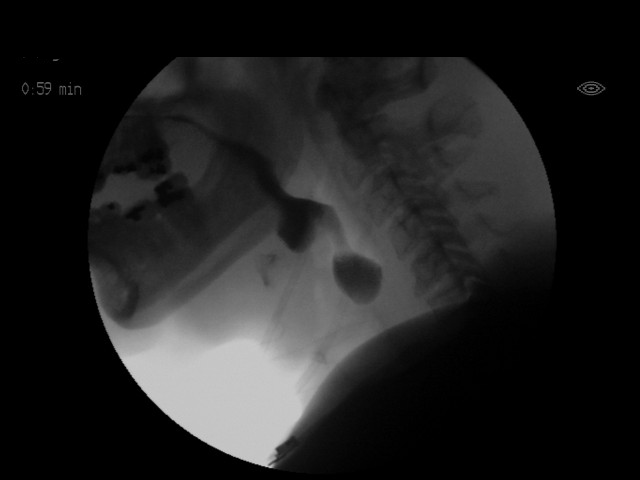
[im 6/10]
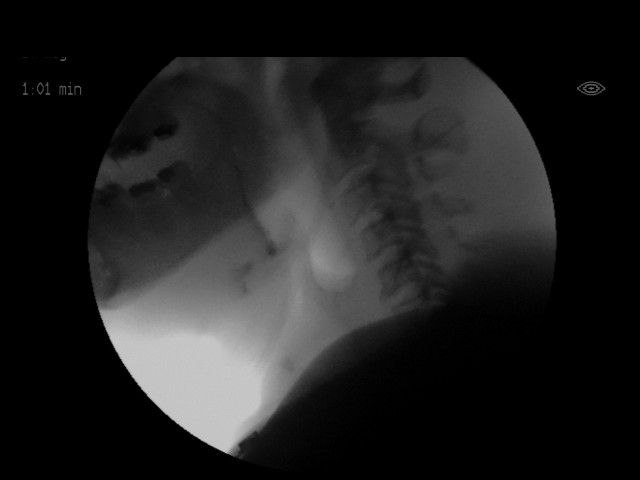
[im 7/10]
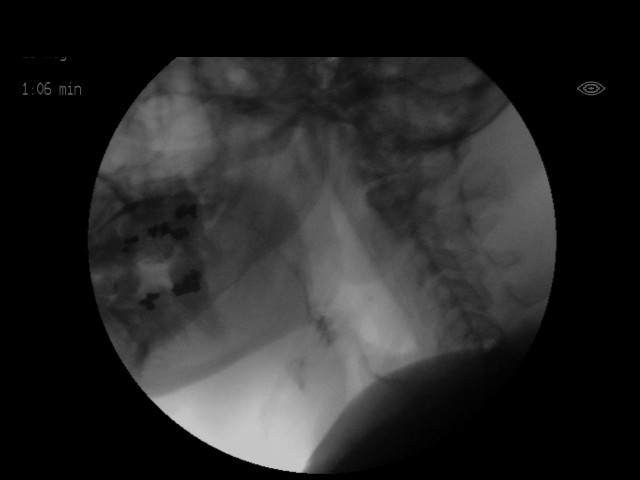
[im 7/10]
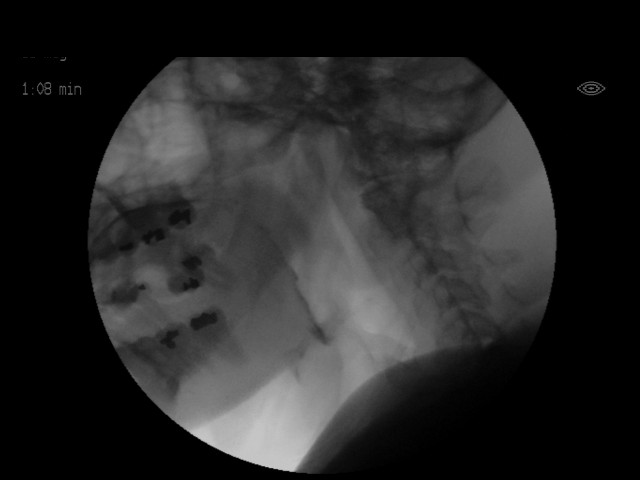
[im 7/10]
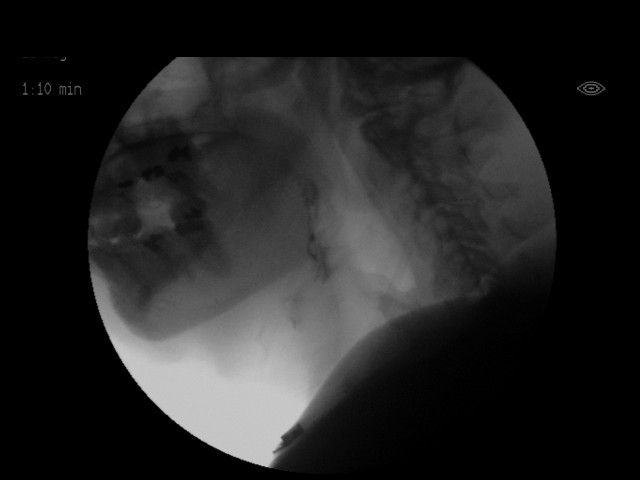
[im 8/10]
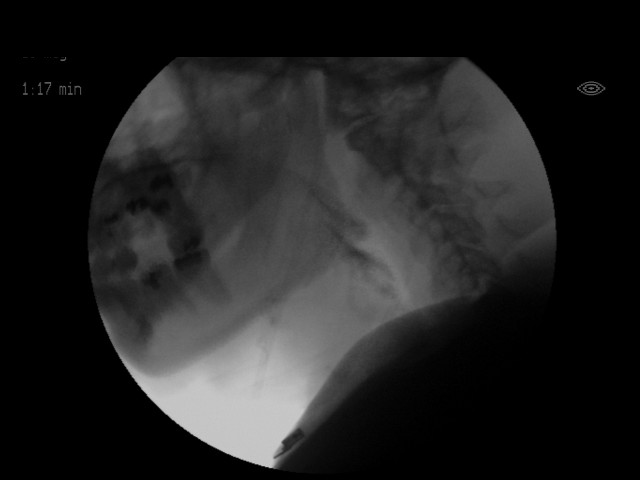
[im 9/10]
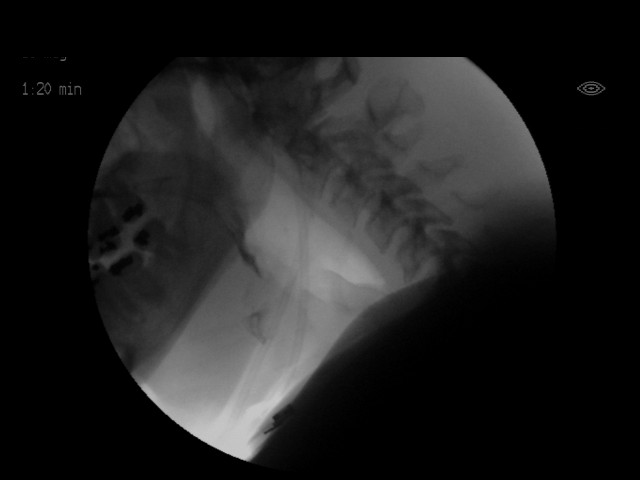
[im 9/10]
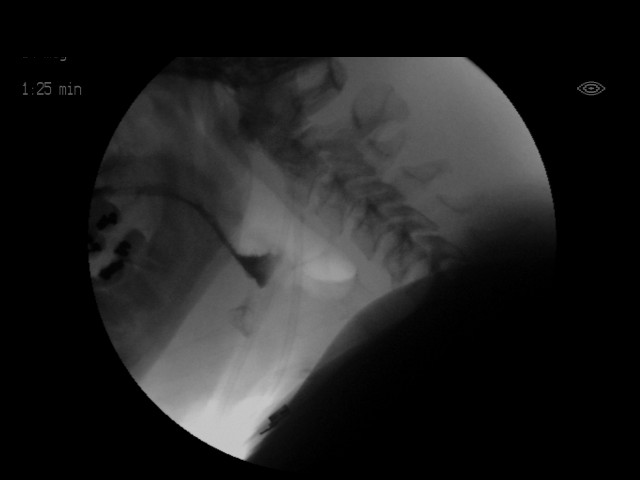
[im 10/10]
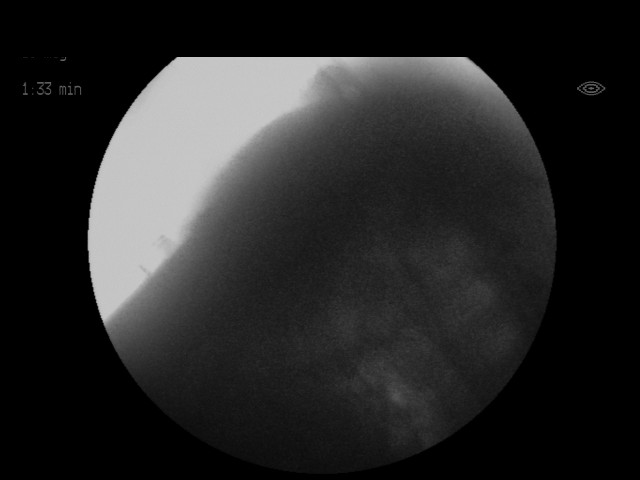
[im 10/10]
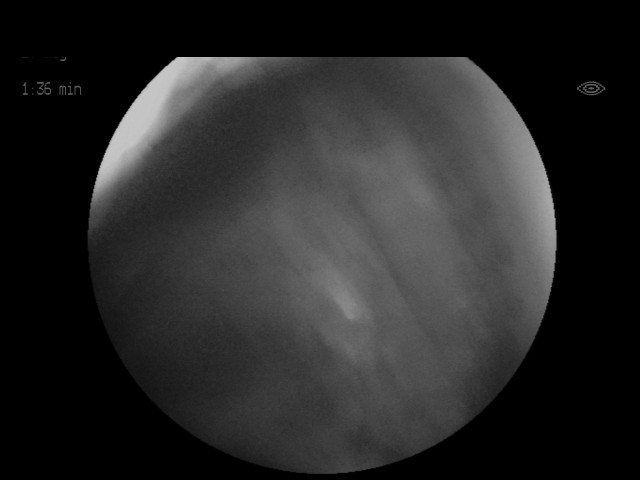

[18 of 24 positions shown; findings below may reference images not displayed]

FLUOROSCOPY FOR SWALLOWING FUNCTION STUDY:
Fluoroscopy was provided for swallowing function study, which was administered by a speech pathologist.  Final results and recommendations from this study are contained within the speech pathology report.
